# Patient Record
Sex: Female | Born: 1935 | Race: White | Hispanic: No | Smoking: Never smoker
Health system: Southern US, Community
[De-identification: ages and names within clinical notes are randomized; demographics above are authoritative.]

## PROBLEM LIST (undated history)

## (undated) DIAGNOSIS — I441 Atrioventricular block, second degree: Secondary | ICD-10-CM

## (undated) DIAGNOSIS — I509 Heart failure, unspecified: Secondary | ICD-10-CM

## (undated) DIAGNOSIS — M8580 Other specified disorders of bone density and structure, unspecified site: Secondary | ICD-10-CM

## (undated) DIAGNOSIS — G629 Polyneuropathy, unspecified: Secondary | ICD-10-CM

## (undated) DIAGNOSIS — A0472 Enterocolitis due to Clostridium difficile, not specified as recurrent: Secondary | ICD-10-CM

## (undated) DIAGNOSIS — I10 Essential (primary) hypertension: Secondary | ICD-10-CM

## (undated) DIAGNOSIS — M1990 Unspecified osteoarthritis, unspecified site: Secondary | ICD-10-CM

## (undated) DIAGNOSIS — A0471 Enterocolitis due to Clostridium difficile, recurrent: Secondary | ICD-10-CM

## (undated) DIAGNOSIS — N183 Chronic kidney disease, stage 3 unspecified: Secondary | ICD-10-CM

## (undated) DIAGNOSIS — I4891 Unspecified atrial fibrillation: Secondary | ICD-10-CM

## (undated) DIAGNOSIS — G4730 Sleep apnea, unspecified: Secondary | ICD-10-CM

## (undated) DIAGNOSIS — I639 Cerebral infarction, unspecified: Secondary | ICD-10-CM

## (undated) DIAGNOSIS — K648 Other hemorrhoids: Secondary | ICD-10-CM

## (undated) DIAGNOSIS — K635 Polyp of colon: Secondary | ICD-10-CM

## (undated) DIAGNOSIS — C50919 Malignant neoplasm of unspecified site of unspecified female breast: Secondary | ICD-10-CM

## (undated) DIAGNOSIS — I5020 Unspecified systolic (congestive) heart failure: Secondary | ICD-10-CM

## (undated) DIAGNOSIS — Z923 Personal history of irradiation: Secondary | ICD-10-CM

## (undated) DIAGNOSIS — I4819 Other persistent atrial fibrillation: Secondary | ICD-10-CM

## (undated) DIAGNOSIS — Z85828 Personal history of other malignant neoplasm of skin: Secondary | ICD-10-CM

## (undated) DIAGNOSIS — E785 Hyperlipidemia, unspecified: Secondary | ICD-10-CM

## (undated) DIAGNOSIS — C541 Malignant neoplasm of endometrium: Secondary | ICD-10-CM

## (undated) DIAGNOSIS — K56600 Partial intestinal obstruction, unspecified as to cause: Secondary | ICD-10-CM

## (undated) DIAGNOSIS — K565 Intestinal adhesions [bands], unspecified as to partial versus complete obstruction: Secondary | ICD-10-CM

## (undated) DIAGNOSIS — E7800 Pure hypercholesterolemia, unspecified: Secondary | ICD-10-CM

## (undated) HISTORY — DX: Polyneuropathy, unspecified: G62.9

## (undated) HISTORY — PX: BREAST SURGERY: SHX581

## (undated) HISTORY — DX: Polyp of colon: K63.5

## (undated) HISTORY — DX: Essential (primary) hypertension: I10

## (undated) HISTORY — DX: Partial intestinal obstruction, unspecified as to cause: K56.600

## (undated) HISTORY — DX: Other hemorrhoids: K64.8

## (undated) HISTORY — DX: Malignant neoplasm of endometrium: C54.1

## (undated) HISTORY — DX: Personal history of irradiation: Z92.3

## (undated) HISTORY — DX: Malignant neoplasm of unspecified site of unspecified female breast: C50.919

## (undated) HISTORY — DX: Enterocolitis due to Clostridium difficile, recurrent: A04.71

## (undated) HISTORY — DX: Other persistent atrial fibrillation: I48.19

## (undated) HISTORY — DX: Other specified disorders of bone density and structure, unspecified site: M85.80

## (undated) HISTORY — PX: MASTECTOMY: SHX3

## (undated) HISTORY — PX: OOPHORECTOMY: SHX86

## (undated) HISTORY — DX: Intestinal adhesions (bands), unspecified as to partial versus complete obstruction: K56.50

## (undated) HISTORY — DX: Atrioventricular block, second degree: I44.1

---

## 1989-05-08 HISTORY — PX: CHOLECYSTECTOMY: SHX55

## 1997-09-07 DIAGNOSIS — C50919 Malignant neoplasm of unspecified site of unspecified female breast: Secondary | ICD-10-CM

## 1997-09-07 HISTORY — PX: MASTECTOMY: SHX3

## 1997-09-07 HISTORY — DX: Malignant neoplasm of unspecified site of unspecified female breast: C50.919

## 2004-09-07 DIAGNOSIS — C541 Malignant neoplasm of endometrium: Secondary | ICD-10-CM

## 2004-09-07 HISTORY — PX: ABDOMINAL HYSTERECTOMY: SHX81

## 2004-09-07 HISTORY — DX: Malignant neoplasm of endometrium: C54.1

## 2005-09-07 HISTORY — PX: INSERT / REPLACE / REMOVE PACEMAKER: SUR710

## 2005-09-07 HISTORY — PX: OTHER SURGICAL HISTORY: SHX169

## 2005-09-07 HISTORY — PX: EXPLORATORY LAPAROTOMY: SUR591

## 2009-04-18 ENCOUNTER — Ambulatory Visit: Admission: RE | Admit: 2009-04-18 | Discharge: 2009-04-18 | Payer: Self-pay | Admitting: Gynecologic Oncology

## 2009-04-18 ENCOUNTER — Other Ambulatory Visit: Admission: RE | Admit: 2009-04-18 | Discharge: 2009-04-18 | Payer: Self-pay | Admitting: Gynecologic Oncology

## 2009-04-18 ENCOUNTER — Encounter: Payer: Self-pay | Admitting: Gynecologic Oncology

## 2009-09-07 DIAGNOSIS — K635 Polyp of colon: Secondary | ICD-10-CM

## 2009-09-07 HISTORY — DX: Polyp of colon: K63.5

## 2009-10-17 ENCOUNTER — Ambulatory Visit: Admission: RE | Admit: 2009-10-17 | Discharge: 2009-10-17 | Payer: Self-pay | Admitting: Gynecologic Oncology

## 2010-04-10 ENCOUNTER — Other Ambulatory Visit: Admission: RE | Admit: 2010-04-10 | Discharge: 2010-04-10 | Payer: Self-pay | Admitting: Gynecologic Oncology

## 2010-04-10 ENCOUNTER — Ambulatory Visit: Admission: RE | Admit: 2010-04-10 | Discharge: 2010-04-10 | Payer: Self-pay | Admitting: Gynecologic Oncology

## 2010-04-14 ENCOUNTER — Ambulatory Visit: Payer: Self-pay | Admitting: Cardiology

## 2010-05-15 ENCOUNTER — Ambulatory Visit: Payer: Self-pay | Admitting: Cardiology

## 2010-05-20 ENCOUNTER — Encounter: Admission: RE | Admit: 2010-05-20 | Discharge: 2010-06-20 | Payer: Self-pay | Admitting: Neurology

## 2010-06-01 ENCOUNTER — Ambulatory Visit: Payer: Self-pay | Admitting: Cardiology

## 2010-06-16 ENCOUNTER — Ambulatory Visit: Payer: Self-pay | Admitting: Cardiology

## 2010-07-08 ENCOUNTER — Encounter: Admission: RE | Admit: 2010-07-08 | Discharge: 2010-07-08 | Payer: Self-pay | Admitting: Endocrinology

## 2010-07-12 ENCOUNTER — Encounter: Payer: Self-pay | Admitting: Internal Medicine

## 2010-07-17 ENCOUNTER — Ambulatory Visit: Payer: Self-pay | Admitting: Cardiology

## 2010-08-04 ENCOUNTER — Ambulatory Visit: Payer: Self-pay | Admitting: Cardiology

## 2010-08-18 ENCOUNTER — Ambulatory Visit: Payer: Self-pay | Admitting: Internal Medicine

## 2010-09-03 ENCOUNTER — Ambulatory Visit: Payer: Self-pay | Admitting: Cardiology

## 2010-09-29 ENCOUNTER — Ambulatory Visit: Payer: Self-pay | Admitting: Cardiology

## 2010-10-01 ENCOUNTER — Encounter
Admission: RE | Admit: 2010-10-01 | Discharge: 2010-10-01 | Payer: Self-pay | Source: Home / Self Care | Attending: Internal Medicine | Admitting: Internal Medicine

## 2010-10-07 NOTE — Miscellaneous (Signed)
Summary: Device preload  Clinical Lists Changes  Observations: Added new observation of PPM INDICATN: Sick sinus syndrome (07/12/2010 11:24) Added new observation of MAGNET RTE: BOL 85 ERI 65 (07/12/2010 11:24) Added new observation of PPMLEADSTAT2: active (07/12/2010 11:24) Added new observation of PPMLEADSER2: [id number] (07/12/2010 11:24) Added new observation of PPMLEADMOD2: 4092  (07/12/2010 11:24) Added new observation of PPMLEADDOI2: 06/23/2006  (07/12/2010 11:24) Added new observation of PPMLEADLOC2: RV  (07/12/2010 11:24) Added new observation of PPMLEADSTAT1: active  (07/12/2010 11:24) Added new observation of PPMLEADSER1: [id number]  (07/12/2010 11:24) Added new observation of PPMLEADMOD1: 5076  (07/12/2010 11:24) Added new observation of PPMLEADDOI1: 06/23/2006  (07/12/2010 11:24) Added new observation of PPMLEADLOC1: RA  (07/12/2010 11:24) Added new observation of PPM DOI: 06/23/2006  (07/12/2010 11:24) Added new observation of PPM SERL#: [id number]  (07/12/2010 11:24) Added new observation of PPM MODL#: [id number]  (07/12/2010 11:24) Added new observation of PACEMAKERMFG: Medtronic  (07/12/2010 11:24) Added new observation of PPM IMP MD: Duffy Rhody Tennant,MD  (07/12/2010 11:24) Added new observation of PPM REFER MD: Rolla Plate  (07/12/2010 11:24) Added new observation of PACEMAKER MD: Sherryl Manges, MD  (07/12/2010 11:24)      PPM Specifications Following MD:  Sherryl Manges, MD     Referring MD:  Rolla Plate PPM Vendor:  Medtronic     PPM Model Number:  [id number]     PPM Serial Number:  [id number] PPM DOI:  06/23/2006     PPM Implanting MD:  Rolla Plate  Lead 1    Location: RA     DOI: 06/23/2006     Model #: [id number]     Serial #: [id number]     Status: active Lead 2    Location: RV     DOI: 06/23/2006     Model #: [id number]     Serial #: [id number]     Status: active  Magnet Response Rate:  BOL 85 ERI 65  Indications:  Sick sinus syndrome

## 2010-10-09 NOTE — Cardiovascular Report (Signed)
Summary: Office Visit   Office Visit   Imported By: Roderic Ovens 08/26/2010 08:50:14  _____________________________________________________________________  External Attachment:    Type:   Image     Comment:   External Document

## 2010-10-09 NOTE — Procedures (Signed)
Summary: pacer check/medtronic   Current Medications (verified): 1)  Propafenone Hcl 225 Mg Tabs (Propafenone Hcl) .... Take 1 Tablet By Mouth Three Times A Day 2)  Furosemide 20 Mg Tabs (Furosemide) .... Take 1 Tablet By Mouth Once Daily 3)  Hydralazine Hcl 50 Mg Tabs (Hydralazine Hcl) .... Take 1 Tablet By Mouth Two Times A Day 4)  Metoprolol Tartrate 100 Mg Tabs (Metoprolol Tartrate) .... Take 1 Tablet By Mouth Once Daily 5)  Lisinopril 10 Mg Tabs (Lisinopril) .... Take 1 Tablet By Mouth Two Times A Day 6)  Warfarin Sodium 2.5 Mg Tabs (Warfarin Sodium) .... Take As Directed By [facility] 7)  Zetia 10 Mg Tabs (Ezetimibe) .... Take 1 Tablet By Mouth Once Daily 8)  Lyrica 75 Mg Caps (Pregabalin) .... Take 1 Tablet By Mouth Two Times A Day 9)  Digoxin 0.125 Mg Tabs (Digoxin) .... Take 1 Tablet By Mouth Once Daily 10)  Vitamin D 5000 Iu .... Take 1 Tablet By Mouth Once Daily  Allergies (verified): 1)  ! Pcn   PPM Specifications Following MD:  Sherryl Manges, MD     Referring MD:  Rolla Plate PPM Vendor:  Medtronic     PPM Model Number:  [id number]     PPM Serial Number:  [id number] PPM DOI:  06/23/2006     PPM Implanting MD:  Rolla Plate  Lead 1    Location: RA     DOI: 06/23/2006     Model #: [id number]     Serial #: [id number]     Status: active Lead 2    Location: RV     DOI: 06/23/2006     Model #: [id number]     Serial #: [id number]     Status: active  Magnet Response Rate:  BOL 85 ERI 65  Indications:  Sick sinus syndrome   PPM Follow Up Battery Voltage:  2.99 V       PPM Device Measurements Atrium  Amplitude: 0.5 mV, Impedance: 680 ohms, Threshold: 0.5 V at 0.4 msec Right Ventricle  Amplitude: 3.8 mV, Impedance: 472 ohms, Threshold: 1.0 V at 0.4 msec  Episodes MS Episodes:  82     Percent Mode Switch:  0.7%     Coumadin:  Yes Ventricular High Rate:  0     Atrial Pacing:  98.7%     Ventricular Pacing:  1.5%  Parameters Mode:  MVP     Lower Rate Limit:  55     Upper  Rate Limit:  130 Paced AV Delay:  180     Sensed AV Delay:  150 Next Cardiology Appt Due:  11/26/2010 Tech Comments:  [facility] PT---82 AF EPISODES--LONGEST WAS 5 HRS. + COUMADIN. NORMAL DEVICE FUNCTION.  NO CHANGES MADE. ROV 11-26-10 @ 1100 W/SK. PT TO RESTART CARELINK AFTER OV W/SK.  Vella Kohler  August 18, 2010 5:07 PM

## 2010-10-28 ENCOUNTER — Encounter (INDEPENDENT_AMBULATORY_CARE_PROVIDER_SITE_OTHER): Payer: Medicare Other

## 2010-10-28 DIAGNOSIS — Z7901 Long term (current) use of anticoagulants: Secondary | ICD-10-CM

## 2010-10-28 DIAGNOSIS — I4891 Unspecified atrial fibrillation: Secondary | ICD-10-CM

## 2010-11-14 ENCOUNTER — Encounter: Payer: Self-pay | Admitting: Internal Medicine

## 2010-11-24 ENCOUNTER — Encounter: Payer: Self-pay | Admitting: *Deleted

## 2010-11-24 DIAGNOSIS — I4891 Unspecified atrial fibrillation: Secondary | ICD-10-CM

## 2010-11-25 ENCOUNTER — Ambulatory Visit (INDEPENDENT_AMBULATORY_CARE_PROVIDER_SITE_OTHER): Payer: Medicare Other | Admitting: *Deleted

## 2010-11-25 DIAGNOSIS — I4891 Unspecified atrial fibrillation: Secondary | ICD-10-CM

## 2010-11-25 DIAGNOSIS — Z7901 Long term (current) use of anticoagulants: Secondary | ICD-10-CM

## 2010-11-25 LAB — POCT INR: INR: 1.7

## 2010-11-26 ENCOUNTER — Telehealth: Payer: Self-pay | Admitting: *Deleted

## 2010-11-26 ENCOUNTER — Encounter: Payer: Self-pay | Admitting: Internal Medicine

## 2010-11-26 ENCOUNTER — Ambulatory Visit (INDEPENDENT_AMBULATORY_CARE_PROVIDER_SITE_OTHER): Payer: Medicare Other | Admitting: Internal Medicine

## 2010-11-26 VITALS — BP 128/73 | HR 71 | Ht 67.0 in | Wt 196.8 lb

## 2010-11-26 DIAGNOSIS — I440 Atrioventricular block, first degree: Secondary | ICD-10-CM

## 2010-11-26 DIAGNOSIS — Z950 Presence of cardiac pacemaker: Secondary | ICD-10-CM

## 2010-11-26 DIAGNOSIS — I495 Sick sinus syndrome: Secondary | ICD-10-CM | POA: Insufficient documentation

## 2010-11-26 DIAGNOSIS — I4891 Unspecified atrial fibrillation: Secondary | ICD-10-CM

## 2010-11-26 DIAGNOSIS — I4510 Unspecified right bundle-branch block: Secondary | ICD-10-CM

## 2010-11-26 NOTE — Assessment & Plan Note (Signed)
She has profound first degree AV block. I will review with Dr. Patty Sermons and Laymond Purser as to whether would not want to sedate her for CPX testing to see whether she be more affectively treated with some degree of ventricular pacing

## 2010-11-26 NOTE — Assessment & Plan Note (Signed)
The patient's device was interrogated.  The information was reviewed. No changes were made in the programming.    

## 2010-11-26 NOTE — Patient Instructions (Signed)
Your physician recommends that you schedule a follow-up appointment in: YEAR WITH DR KLEIN 

## 2010-11-26 NOTE — Assessment & Plan Note (Addendum)
The patient has atrial fibrillation that is paroxysmal. It is not rapid according to device interrogation data. He is recently well controlled with Rythmol  However, I am impressed that the degree of conduction system disease and wonder whether Rythmol is appropriate to maintain long-term. However, she has been quite asymptomatic and so I will defer this to Dr. Patty Sermons.  I do wonder whether we could stop the digoxin at this time given her presumed normal left ventricular function.    She is appropriately on coumadin with CHADS VASC score of 3

## 2010-11-26 NOTE — Telephone Encounter (Signed)
Dr Odessa Fleming phoned regarding patient digoxin and if she needs to continue, has had a low heart rate.  Discussed with Dr Patty Sermons and will discontinue.  Called patient and advised.  Advised patient to call back if any problems.

## 2010-11-26 NOTE — Progress Notes (Signed)
HPI: Yolanda Spears is seen to establish pacemaker followup.  She has a history of tachybradycardia syndrome and underwent pacemaker implantation in 2007 with a Medtronic device. She has been managed with Rythmol as well as rate control with digoxin And metoprolol. Current Outpatient Prescriptions  Medication Sig Dispense Refill  . calcium carbonate (TUMS EX) 750 MG chewable tablet Chew 1 tablet by mouth daily.        . Cholecalciferol (VITAMIN D3) 5000 UNITS CAPS Take 1 capsule by mouth daily.        . digoxin (LANOXIN) 0.125 MG tablet Take 125 mcg by mouth daily.        Marland Kitchen ezetimibe (ZETIA) 10 MG tablet Take 10 mg by mouth daily.        . furosemide (LASIX) 20 MG tablet Take 20 mg by mouth daily. 1 TAB QD       . hydrALAZINE (APRESOLINE) 25 MG tablet Take 25 mg by mouth 2 (two) times daily.        Marland Kitchen lisinopril (PRINIVIL,ZESTRIL) 10 MG tablet Take 10 mg by mouth 2 (two) times daily.        . metoprolol (LOPRESSOR) 100 MG tablet Take 100 mg by mouth daily.        . pregabalin (LYRICA) 75 MG capsule Take 75 mg by mouth 2 (two) times daily.        . propafenone (RYTHMOL) 225 MG tablet Take 225 mg by mouth every 8 (eight) hours.        Marland Kitchen warfarin (COUMADIN) 2.5 MG tablet Take 2.5 mg by mouth as directed.          Allergies  Allergen Reactions  . Penicillins   . Shellfish-Derived Products     Past Medical History  Diagnosis Date  . Atrial fibrillation   . Pacemaker   . Endometrial cancer   . Hypertension     Past Surgical History  Procedure Date  . Mastectomy   . Total abdominal hysterectomy w/ bilateral salpingoophorectomy   . Lymph node resection     Family History  Problem Relation Age of Onset  . Cancer Mother     History   Social History  . Marital Status: Widowed    Spouse Name: N/A    Number of Children: N/A  . Years of Education: N/A   Occupational History  . Retired    Social History Main Topics  . Smoking status: Former Smoker -- 1.0 packs/day    Types:  Cigarettes    Quit date: 11/26/1998  . Smokeless tobacco: Not on file  . Alcohol Use: No  . Drug Use: No  . Sexually Active: Not on file   Other Topics Concern  . Not on file   Social History Narrative  . No narrative on file    Fourteen point review of systems was negative except as noted in HPI and PMH Except for tinnitus, incipient cataracts, recent easy satiety.  PHYSICAL EXAMINATION  Blood pressure 128/73, pulse 71, height 5\' 7"  (1.702 m), weight 196 lb 12.8 oz (89.268 kg).   Well developed and nourished Older Caucasian female appearing her stated age in no acute distress HENT normal Neck supple with JVP-flat Carotids brisk and full without bruits Back without kyphosis and scoliosis Bilateral mastectomy Pacemaker pocket well healed Clear Regular rate and rhythm, no murmurs or gallops Abd-soft with active BS without hepatomegaly or midline pulsation Femoral pulses 2+ distal pulses intact No Clubbing cyanosis edema Skin-warm and dry LN-neg submandibular and supraclavicular A &  Oriented CN 3-12 normal  Grossly normal sensory and motor function Affect engaging . ECG  A paced at 60 Intervals 0.52/0.1/0.44 Axis is -14

## 2010-11-26 NOTE — Assessment & Plan Note (Signed)
The issue with right bundle branch block in the setting of Rythmol is whether the conduction system disease that may represent has been aggravated by propafenone and whether this is also aggravating AV node conduction.

## 2010-11-27 NOTE — Telephone Encounter (Signed)
Agree 

## 2010-12-29 ENCOUNTER — Ambulatory Visit (INDEPENDENT_AMBULATORY_CARE_PROVIDER_SITE_OTHER): Payer: Medicare Other | Admitting: *Deleted

## 2010-12-29 DIAGNOSIS — I4891 Unspecified atrial fibrillation: Secondary | ICD-10-CM

## 2011-01-07 ENCOUNTER — Other Ambulatory Visit: Payer: Self-pay | Admitting: Cardiology

## 2011-01-07 DIAGNOSIS — I10 Essential (primary) hypertension: Secondary | ICD-10-CM

## 2011-01-07 NOTE — Telephone Encounter (Signed)
Fax received from pharmacy. Refill completed. Jodette Khamryn Calderone RN  

## 2011-01-09 ENCOUNTER — Encounter: Payer: Self-pay | Admitting: Cardiology

## 2011-01-09 ENCOUNTER — Ambulatory Visit (INDEPENDENT_AMBULATORY_CARE_PROVIDER_SITE_OTHER): Payer: Medicare Other | Admitting: Cardiology

## 2011-01-09 VITALS — BP 130/60 | HR 63 | Wt 197.0 lb

## 2011-01-09 DIAGNOSIS — R55 Syncope and collapse: Secondary | ICD-10-CM

## 2011-01-09 DIAGNOSIS — I119 Hypertensive heart disease without heart failure: Secondary | ICD-10-CM

## 2011-01-09 DIAGNOSIS — I4891 Unspecified atrial fibrillation: Secondary | ICD-10-CM

## 2011-01-09 LAB — CBC WITH DIFFERENTIAL/PLATELET
Basophils Relative: 1.1 % (ref 0.0–3.0)
Eosinophils Relative: 1.4 % (ref 0.0–5.0)
Lymphocytes Relative: 13.9 % (ref 12.0–46.0)
Neutrophils Relative %: 74.8 % (ref 43.0–77.0)
RBC: 4.42 Mil/uL (ref 3.87–5.11)
WBC: 5.9 10*3/uL (ref 4.5–10.5)

## 2011-01-09 LAB — COMPREHENSIVE METABOLIC PANEL
Albumin: 3.4 g/dL — ABNORMAL LOW (ref 3.5–5.2)
BUN: 23 mg/dL (ref 6–23)
Calcium: 8.8 mg/dL (ref 8.4–10.5)
Chloride: 102 mEq/L (ref 96–112)
Glucose, Bld: 106 mg/dL — ABNORMAL HIGH (ref 70–99)
Potassium: 3.7 mEq/L (ref 3.5–5.1)

## 2011-01-09 MED ORDER — HYDRALAZINE HCL 25 MG PO TABS: 25.0000 mg | ORAL_TABLET | Freq: Two times a day (BID) | ORAL | Status: DC

## 2011-01-09 NOTE — Assessment & Plan Note (Signed)
The patient is not having any problems from her Coumadin.  Her recent INR was therapeutic at 2.5.  The patient has not had any TIA symptoms.

## 2011-01-09 NOTE — Progress Notes (Signed)
Yolanda Spears Date of Birth:  Sep 24, 1934 [facility] [address]   [address] [place], [place]  [address] [phone]           Fax   [phone]  History of Present Illness: This pleasant 76 year old woman is seen for a scheduled 3 month followup office visit.  She has a history of paroxysmal atrial fibrillation.  She has a functioning dual-chamber pacemaker.  Pacemaker was implanted elsewhere in 2007.  His not having any chest pain or TIA or stroke symptoms.  He has a history of idiopathic peripheral neuropathy.  She has a past history of fluid retention which responds to low-dose furosemide.  Her echocardiogram on 10/03/08 showed ejection fraction of 55-60% with normal systolic and diastolic function and mild left atrial enlargement and mild aortic stenosis with mild to moderate aortic insufficiency and she had normal pulmonary artery pressure.  She has not had cardiac catheterization.  She does not give any history of ischemic heart disease.  She does have a past history of essential hypertension.  Current Outpatient Prescriptions  Medication Sig Dispense Refill  . Cholecalciferol (VITAMIN D3) 5000 UNITS CAPS Take 1 capsule by mouth daily.        Marland Kitchen ezetimibe (ZETIA) 10 MG tablet Take 5 mg by mouth daily.       . furosemide (LASIX) 20 MG tablet Take 20 mg by mouth daily. 1 TAB QD       . hydrALAZINE (APRESOLINE) 25 MG tablet Take 1 tablet (25 mg total) by mouth 2 (two) times daily.  30 tablet  12  . lisinopril (PRINIVIL,ZESTRIL) 10 MG tablet Take 10 mg by mouth 2 (two) times daily.        . metoprolol (LOPRESSOR) 100 MG tablet Take 100 mg by mouth daily.        . pregabalin (LYRICA) 75 MG capsule Take 75 mg by mouth 2 (two) times daily.        . propafenone (RYTHMOL) 225 MG tablet Take 225 mg by mouth every 8 (eight) hours.        Marland Kitchen warfarin (COUMADIN) 2.5 MG tablet Take 2.5 mg by mouth as directed.        Marland Kitchen DISCONTD: hydrALAZINE (APRESOLINE) 25 MG tablet Take  50 mg by mouth 2 (two) times daily.       . digoxin (LANOXIN) 0.125 MG tablet Take 125 mcg by mouth daily.        . metoprolol (TOPROL-XL) 100 MG 24 hr tablet TAKE 1 TABLET BY MOUTH EVERY DAY  90 tablet  3    Allergies  Allergen Reactions  . Penicillins   . Shellfish-Derived Products   . Statins     Myalgias,elevated LFT studies    Patient Active Problem List  Diagnoses  . Atrial fibrillation  . First degree AV block  . Pacemaker- Dual-chamber-mdt  . Right bundle branch block  . Near syncope    History  Smoking status  . Former Smoker -- 1.0 packs/day  . Types: Cigarettes  . Quit date: 11/26/1998  Smokeless tobacco  . Not on file    History  Alcohol Use No    Family History  Problem Relation Age of Onset  . Cancer Mother     Review of Systems: Constitutional: no fever chills diaphoresis or fatigue or change in weight.  Head and neck: no hearing loss, no epistaxis, no photophobia or visual disturbance. Respiratory: No cough, shortness of breath or wheezing. Cardiovascular: No chest pain  peripheral edema, palpitations. Gastrointestinal: No abdominal distention, no abdominal pain, no change in bowel habits hematochezia or melena. Genitourinary: No dysuria, no frequency, no urgency, no nocturia. Musculoskeletal:No arthralgias, no back pain, no gait disturbance or myalgias. Neurological: No dizziness, no headaches, no numbness, no seizures, no syncope, no weakness, no tremors. Hematologic: No lymphadenopathy, no easy bruising. Psychiatric: No confusion, no hallucinations, no sleep disturbance.    Physical Exam: Filed Vitals:   01/09/11 1125  BP: 130/60  Pulse: 63  Her blood pressure supine is 130/60 and standing is 120/50.  The general appearance reveals a well-developed well-nourished woman in no distress.Pupils equal and reactive.   Extraocular Movements are full.  There is no scleral icterus.  The mouth and pharynx are normal.  The neck is supple.  The  carotids reveal no bruits.  The jugular venous pressure is normal.  The thyroid is not enlarged.  There is no lymphadenopathy.The chest is clear to percussion and auscultation. There are no rales or rhonchi. Expansion of the chest is symmetrical.The precordium is quiet.  The first heart sound is normal.  The second heart sound is physiologically split.  There is no murmur gallop rub or click.  There is no abnormal lift or heave.The abdomen is soft and nontender. Bowel sounds are normal. The liver and spleen are not enlarged. There Are no abdominal masses. There are no bruits.The pedal pulses are good.  There is no phlebitis or edema.  There is no cyanosis or clubbing.Strength is normal and symmetrical in all extremities.  There is no lateralizing weakness.  There are no sensory deficits.The skin is warm and dry.  There is no rash.   Assessment / Plan: Her symptoms may be related to orthostatic hypotension.  We are going to reduce her Apresoline to 25 mg twice a day.  We're also checking a CBC and a Comprehensive metabolic panel today.Return at monthly intervals for protime and in 3 months for followup office visit and protime.

## 2011-01-09 NOTE — Assessment & Plan Note (Signed)
The patient comes in for a routine 3 month followup office visit.  She has not been feeling as well she has been experiencing symptoms of intermittent leg weakness and vague symptoms in her epigastrium and feels like she is about to black out.  If she can lie down the symptoms resolved.  She does not think that the symptoms are related to hypoglycemia.She has not been expressing any exertional chest pain or angina.  She is on long-term Coumadin for her atrial fibrillation and she has a functioning pacemaker.  Dr. Graciela Husbands follows her pacemaker

## 2011-01-13 ENCOUNTER — Telehealth: Payer: Self-pay | Admitting: *Deleted

## 2011-01-13 NOTE — Telephone Encounter (Signed)
Results reported. 

## 2011-01-20 NOTE — Consult Note (Signed)
NAME:  KAYLONI, ROCCO NO.:  [id number]   MEDICAL RECORD NO.:  [id number]          PATIENT TYPE:  OUT   LOCATION:  GYN                          [facility]ACILITY[facility]   PHYSICIAN:  Laurette Schimke, MD     DATE OF BIRTH:  [birth year]   DATE OF CONSULTATION:  04/18/2009  DATE OF DISCHARGE:                                 CONSULTATION   REQUESTED BY:  Dr. Tonna Corner.   REASON FOR CONSULTATION:  First surveillance of endometrial cancer.   HISTORY OF PRESENT ILLNESS:  This is a 76 year old who underwent a total  abdominal hysterectomy and bilateral salpingo-oophorectomy with  bilateral pelvic lymph node dissection, peri-aortic lymph node sampling  in April 2006.  The pathology is notable for metastatic disease in the  pelvic lymph nodes.  She received adjuvant radiation therapy that was  completed in June 2006.  The patient did well and was asymptomatic.  Left retroperitoneal cyst and left hydronephrosis.  In April 2007, she  underwent resection of the lymphocyst.  Of note, she did well; however,  in December 2008, a Pap smear was noted to have AGUS.  A CT of the  abdomen and pelvis was obtained and was within normal limits.  Biopsies  were performed and there was no evidence of recurrent disease.  Ms.  Boeder has done well since.   PAST MEDICAL HISTORY:  1. Bilateral lobular carcinoma in situ.  2. Atrial fibrillation diagnosed in 2006.  3. Uropathy diagnosed in 2010.  4. Hypertension diagnosed in 1997.   PAST SURGICAL HISTORY:  1. Bilateral mastectomy in 1999.  2. Pacemaker placement in 2007.  3. Total abdominal hysterectomy, bilateral salpingo-oophorectomy and      bilateral pelvic lymph node and peri-aortic lymph node dissection      in 2006.  4. Lymphocytic resection in 2007, which was complicated by an      infection.   PAST GYN HISTORY:  Menarche occurred at age 18.  Menses occurred  monthly.  She is gravida 2, para 2.   FAMILY HISTORY:  Notable for  her mother with uterine cancer diagnosed in  her 80's, and breast cancer in her 101's.  Father was diagnosed with bone  cancer in his 85's.   MEDICATIONS:  1. Gabapentin 100 mg three times daily.  2. Propafenone 325 mg three times daily.  3. Hydralazine 50 mg twice daily.  4. Metoprolol 100 mg once daily.  5. Hydrochlorothiazide 50 mg, 1/2 tab daily.  6. Zetia 10 mg, 1/2 tab daily.  7. Lisinopril 10 mg daily.  8. Digoxin 0.125 mg daily.  9. Warfarin 2.5 mg daily.   ALLERGIES:  PENICILLIN which causes hives.  SHELLFISH, which cause  emesis.   REVIEW OF SYSTEMS:  A 10-point review of systems was negative.   PHYSICAL EXAMINATION:  VITAL SIGNS:  Weight 205 pounds, height 5 feet 7  inches, blood pressure 140/70.  GENERAL:  A well-developed female, in no acute distress.  CHEST:  Clear to auscultation.  HEART:  Regular rate and rhythm.  ABDOMEN:  Soft, obese, nontender.  No masses or hernia.  BACK:  No CVA tenderness.  EXTREMITIES:  No clubbing, cyanosis or edema.  PELVIC EXAM:  Normal external genitalia, Bartholin's, urethral and  Skene's.  The vagina is somewhat foreshortened and atrophic.  No lesions  are visualized.  No parametrial thickening or nodularity.  RECTAL:  External hemorrhoids were appreciated.  Good anal sphincter  tone.  No rectovaginal masses.   IMPRESSION/PLAN:  Stage IIIC endometrial cancer:  Ms. Hittle remains  without any evidence of disease.  A Pap test was collected today and she  was advised to follow up in six months.      Laurette Schimke, MD  Electronically Signed     WB/MEDQ  D:  04/18/2009  T:  04/18/2009  Job:  [id number]   cc:   Telford Nab, R.N.  [address]  [place], [place] [address]   Tonna Corner  Fax: [phone]   Primary Care Physician

## 2011-01-23 ENCOUNTER — Telehealth: Payer: Self-pay | Admitting: Cardiology

## 2011-01-23 NOTE — Telephone Encounter (Signed)
Sent copy of labs to PCP at patient request

## 2011-01-23 NOTE — Telephone Encounter (Signed)
Called and discussed labs.

## 2011-01-23 NOTE — Telephone Encounter (Signed)
CALL PATIENT REGARDING RECENT LABWORK.

## 2011-01-26 ENCOUNTER — Encounter: Payer: Medicare Other | Admitting: *Deleted

## 2011-01-28 ENCOUNTER — Ambulatory Visit (INDEPENDENT_AMBULATORY_CARE_PROVIDER_SITE_OTHER): Payer: Medicare Other | Admitting: *Deleted

## 2011-01-28 DIAGNOSIS — I4891 Unspecified atrial fibrillation: Secondary | ICD-10-CM

## 2011-01-28 LAB — POCT INR: INR: 2.3

## 2011-02-16 ENCOUNTER — Other Ambulatory Visit: Payer: Self-pay | Admitting: *Deleted

## 2011-02-16 DIAGNOSIS — E785 Hyperlipidemia, unspecified: Secondary | ICD-10-CM

## 2011-02-16 MED ORDER — EZETIMIBE 10 MG PO TABS: 5.0000 mg | ORAL_TABLET | Freq: Every day | ORAL | Status: DC

## 2011-02-16 NOTE — Telephone Encounter (Signed)
Refilled meds per fax request.  

## 2011-02-26 ENCOUNTER — Ambulatory Visit (INDEPENDENT_AMBULATORY_CARE_PROVIDER_SITE_OTHER): Payer: Medicare Other | Admitting: *Deleted

## 2011-02-26 ENCOUNTER — Encounter: Payer: Medicare Other | Admitting: *Deleted

## 2011-02-26 DIAGNOSIS — I4891 Unspecified atrial fibrillation: Secondary | ICD-10-CM

## 2011-02-26 LAB — POCT INR: INR: 2.1

## 2011-03-04 ENCOUNTER — Encounter: Payer: Self-pay | Admitting: *Deleted

## 2011-03-12 ENCOUNTER — Other Ambulatory Visit: Payer: Self-pay | Admitting: Internal Medicine

## 2011-03-12 ENCOUNTER — Ambulatory Visit (INDEPENDENT_AMBULATORY_CARE_PROVIDER_SITE_OTHER): Payer: Medicare Other | Admitting: *Deleted

## 2011-03-12 DIAGNOSIS — I440 Atrioventricular block, first degree: Secondary | ICD-10-CM

## 2011-03-12 DIAGNOSIS — I4891 Unspecified atrial fibrillation: Secondary | ICD-10-CM

## 2011-03-12 DIAGNOSIS — Z950 Presence of cardiac pacemaker: Secondary | ICD-10-CM

## 2011-03-13 LAB — REMOTE PACEMAKER DEVICE
AL IMPEDENCE PM: 576 Ohm
AL IMPEDENCE PM: 576 Ohm
ATRIAL PACING PM: 98.24
ATRIAL PACING PM: 98.24
BAMS-0001: 170 {beats}/min
BAMS-0001: 170 {beats}/min
BATTERY VOLTAGE: 2.99 V
BAVD-0001: 150 ms
BMOD-0002: 7
BMOD-0004: 5
BPAC-0002RV: 2.5 V
BRDY-0002: 55 {beats}/min
BRDY-0004: 130 {beats}/min
BSEN-0004RA: 310 ms
BSEN-0005RA: 150 ms
BSEN-0005RV: 200 ms
DEV-0004LDO: 5076
DEV-0006LDO: 20071017
DEV-0006LDO: 20071017
DEV-0023LDO: 0
DEV-0023LDO: 0
DEV-0023PM: 0
RV LEAD AMPLITUDE: 5.4579 mv
RV LEAD IMPEDENCE PM: 448 Ohm
VENTRICULAR PACING PM: 0.84

## 2011-03-18 ENCOUNTER — Encounter: Payer: Self-pay | Admitting: *Deleted

## 2011-03-19 ENCOUNTER — Emergency Department (HOSPITAL_COMMUNITY): Payer: Medicare Other

## 2011-03-19 ENCOUNTER — Inpatient Hospital Stay (HOSPITAL_COMMUNITY)
Admission: EM | Admit: 2011-03-19 | Discharge: 2011-03-21 | DRG: 069 | Disposition: A | Discharge status: Home / Self Care | Payer: Medicare Other | Attending: Internal Medicine | Admitting: Internal Medicine

## 2011-03-19 ENCOUNTER — Encounter (HOSPITAL_COMMUNITY): Payer: Self-pay | Admitting: Radiology

## 2011-03-19 DIAGNOSIS — A498 Other bacterial infections of unspecified site: Secondary | ICD-10-CM | POA: Diagnosis present

## 2011-03-19 DIAGNOSIS — Z7901 Long term (current) use of anticoagulants: Secondary | ICD-10-CM

## 2011-03-19 DIAGNOSIS — N390 Urinary tract infection, site not specified: Secondary | ICD-10-CM | POA: Diagnosis present

## 2011-03-19 DIAGNOSIS — Z8673 Personal history of transient ischemic attack (TIA), and cerebral infarction without residual deficits: Secondary | ICD-10-CM

## 2011-03-19 DIAGNOSIS — I10 Essential (primary) hypertension: Secondary | ICD-10-CM | POA: Diagnosis present

## 2011-03-19 DIAGNOSIS — Z8542 Personal history of malignant neoplasm of other parts of uterus: Secondary | ICD-10-CM

## 2011-03-19 DIAGNOSIS — E781 Pure hyperglyceridemia: Secondary | ICD-10-CM | POA: Diagnosis present

## 2011-03-19 DIAGNOSIS — R791 Abnormal coagulation profile: Secondary | ICD-10-CM | POA: Diagnosis present

## 2011-03-19 DIAGNOSIS — Z950 Presence of cardiac pacemaker: Secondary | ICD-10-CM

## 2011-03-19 DIAGNOSIS — Z79899 Other long term (current) drug therapy: Secondary | ICD-10-CM

## 2011-03-19 DIAGNOSIS — N368 Other specified disorders of urethra: Secondary | ICD-10-CM | POA: Diagnosis not present

## 2011-03-19 DIAGNOSIS — G459 Transient cerebral ischemic attack, unspecified: Principal | ICD-10-CM | POA: Diagnosis present

## 2011-03-19 DIAGNOSIS — I4891 Unspecified atrial fibrillation: Secondary | ICD-10-CM | POA: Diagnosis present

## 2011-03-19 LAB — CBC
HCT: 40.7 % (ref 36.0–46.0)
Hemoglobin: 13.9 g/dL (ref 12.0–15.0)
MCV: 90 fL (ref 78.0–100.0)
WBC: 7 10*3/uL (ref 4.0–10.5)

## 2011-03-19 LAB — URINALYSIS, ROUTINE W REFLEX MICROSCOPIC
Glucose, UA: NEGATIVE mg/dL
Ketones, ur: NEGATIVE mg/dL
Nitrite: POSITIVE — AB
Specific Gravity, Urine: 1.02 (ref 1.005–1.030)
pH: 5 (ref 5.0–8.0)

## 2011-03-19 LAB — DIFFERENTIAL
Basophils Relative: 0 % (ref 0–1)
Eosinophils Relative: 3 % (ref 0–5)
Monocytes Absolute: 0.6 10*3/uL (ref 0.1–1.0)
Monocytes Relative: 9 % (ref 3–12)
Neutrophils Relative %: 76 % (ref 43–77)

## 2011-03-19 LAB — BASIC METABOLIC PANEL
CO2: 31 mEq/L (ref 19–32)
Calcium: 9.1 mg/dL (ref 8.4–10.5)
Chloride: 101 mEq/L (ref 96–112)
Glucose, Bld: 115 mg/dL — ABNORMAL HIGH (ref 70–99)
Potassium: 3.8 mEq/L (ref 3.5–5.1)
Sodium: 141 mEq/L (ref 135–145)

## 2011-03-19 LAB — TROPONIN I: Troponin I: <0.3 ng/mL (ref <0.30)

## 2011-03-19 LAB — PROTIME-INR: INR: 1.41 (ref 0.00–1.49)

## 2011-03-19 LAB — APTT: aPTT: 31 seconds (ref 24–37)

## 2011-03-19 LAB — URINE MICROSCOPIC-ADD ON

## 2011-03-20 DIAGNOSIS — G459 Transient cerebral ischemic attack, unspecified: Secondary | ICD-10-CM

## 2011-03-20 LAB — HEMOGLOBIN A1C
Hgb A1c MFr Bld: 5.7 % — ABNORMAL HIGH (ref <5.7)
Mean Plasma Glucose: 117 mg/dL — ABNORMAL HIGH (ref <117)

## 2011-03-20 LAB — LIPID PANEL: Cholesterol: 161 mg/dL (ref 0–200)

## 2011-03-20 LAB — PROTIME-INR: INR: 1.64 — ABNORMAL HIGH (ref 0.00–1.49)

## 2011-03-20 NOTE — Progress Notes (Signed)
Pacer remote check  

## 2011-03-21 ENCOUNTER — Inpatient Hospital Stay (HOSPITAL_COMMUNITY): Payer: Medicare Other

## 2011-03-21 LAB — PROTIME-INR: Prothrombin Time: 25.6 seconds — ABNORMAL HIGH (ref 11.6–15.2)

## 2011-03-21 MED ORDER — IOHEXOL 350 MG/ML SOLN
50.0000 mL | Freq: Once | INTRAVENOUS | Status: AC | PRN
  Administered 2011-03-21: 50 mL via INTRAVENOUS

## 2011-03-22 LAB — URINE CULTURE

## 2011-03-26 ENCOUNTER — Other Ambulatory Visit: Payer: Self-pay | Admitting: *Deleted

## 2011-03-26 ENCOUNTER — Other Ambulatory Visit (INDEPENDENT_AMBULATORY_CARE_PROVIDER_SITE_OTHER): Payer: Medicare Other | Admitting: *Deleted

## 2011-03-26 ENCOUNTER — Ambulatory Visit (INDEPENDENT_AMBULATORY_CARE_PROVIDER_SITE_OTHER): Payer: Medicare Other | Admitting: *Deleted

## 2011-03-26 DIAGNOSIS — E559 Vitamin D deficiency, unspecified: Secondary | ICD-10-CM

## 2011-03-26 DIAGNOSIS — I10 Essential (primary) hypertension: Secondary | ICD-10-CM

## 2011-03-26 DIAGNOSIS — I4891 Unspecified atrial fibrillation: Secondary | ICD-10-CM

## 2011-03-26 LAB — LIPID PANEL
Cholesterol: 188 mg/dL (ref 0–200)
HDL: 43.8 mg/dL (ref >39.00)
VLDL: 27 mg/dL (ref 0.0–40.0)

## 2011-03-26 LAB — POCT INR: INR: 2.2

## 2011-03-27 LAB — VITAMIN D 25 HYDROXY (VIT D DEFICIENCY, FRACTURES): Vit D, 25-Hydroxy: 74 ng/mL (ref 30–89)

## 2011-04-06 ENCOUNTER — Other Ambulatory Visit: Payer: Self-pay | Admitting: *Deleted

## 2011-04-06 MED ORDER — LISINOPRIL 10 MG PO TABS: 10.0000 mg | ORAL_TABLET | Freq: Two times a day (BID) | ORAL | Status: DC

## 2011-04-06 NOTE — Telephone Encounter (Signed)
escribe medication per fax request  

## 2011-04-07 ENCOUNTER — Other Ambulatory Visit: Payer: Self-pay | Admitting: Cardiology

## 2011-04-08 ENCOUNTER — Other Ambulatory Visit: Payer: Self-pay | Admitting: *Deleted

## 2011-04-08 MED ORDER — WARFARIN SODIUM 2.5 MG PO TABS: 2.5000 mg | ORAL_TABLET | ORAL | Status: DC

## 2011-04-16 NOTE — Discharge Summary (Signed)
NAMEMarland Kitchen  Yolanda Spears, Yolanda Spears NO.:  [id number]  MEDICAL RECORD NO.:  [id number]  LOCATION:  3022                         FACILITY:  [facility]  PHYSICIAN:  Calvert Cantor, M.D.     DATE OF BIRTH:  1934-10-07  DATE OF ADMISSION:  03/19/2011 DATE OF DISCHARGE:  03/21/2011                              DISCHARGE SUMMARY   PRIMARY CARE PHYSICIAN:  Thayer Headings, MD  PRESENTING COMPLAINT:  Aphasia and confusion.  DISCHARGE DIAGNOSES: 1. Transient ischemic attack. 2. Hypertriglyceridemia.  PAST MEDICAL HISTORY: 1. Atrial fibrillation. 2. Pacemaker. 3. Hypertension. 4. History of endometrial cancer. 5. Prior cerebrovascular accidents and transient ischemic attacks.  DISCHARGE MEDICATIONS:  Continue all of the following: 1. Lisinopril 10 mg daily twice a day. 2. Lyrica 75 mg twice a day. 3. Furosemide 20 mg daily. 4. Hydralazine 25 mg twice a day. 5. Propafenone 225 mg every 8 hours. 6. Metoprolol XL 100 mg daily. 7. Warfarin 2.5 mg daily at bedtime. 8. Zetia 10 mg 1/2 tab daily at bedtime. 9. Tums 1 tab daily. 10.Vitamin D 5000 units daily.  PROCEDURES: 1. CT scan of the head without contrast performed on March 19, 2011,     revealed atrophy with small-vessel chronic ischemic changes of deep     cerebral white matter.  Tiny old lacunar infarct, right thalamus.     No acute intracranial abnormalities. 2. CTA of the head and neck revealed 30% stenosis of proximal left     subclavian artery.  Atherosclerotic disease at both carotid     bifurcations but without narrowing of the ICA lumen, beyond that     the most distal cervical internal carotid arteries.  Therefore,     using the NASCET criteria, there is no stenosis.  The left vertebral artery is a large vessel, widely patent.  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  Enlargement of the thyroid with multiple nodules likely represents  multinodular goiter.  Atherosclerotic calcification in both carotid siphons with narrowing, estimated at 50% bilaterally.  Stenosis at both supraclinoid ICA region 50-70% on the right and 30-50% on the left.  No middle cerebral artery stenosis.  Diminutive A1 segment on the right, probably congenital.  No basilar stenosis.  Reconstituted retrograde flow was noted in the distal right vertebral artery.  Left vertebral is widely patent.  A 2D echo revealed the LV cavity size was normal, systolic function was normal, EF was 55-60%.  Wall motion was normal; there were no regional wall motion abnormalities.  Aortic valve reveals trivial regurgitation. Left atrium was mildly dilated.  Atrial septum did not reveal any defect or patent foremen ovale.  Ultrasound/carotid duplex did not reveal any evidence of stenosis bilaterally.  Left vertebral artery flow was antegrade, right vertebral artery flow is not visualized.  Waveform is minimized with no diastolic flow, suggesting distal occlusion.  PERTINENT LAB RESULTS:  Triglycerides were noted to be elevated at 198, HDL was 39, LDL was 82, cholesterol level 161.  HOSPITAL COURSE:  This is a 76 year old female with atrial fibrillation who presented with aphasia and confusion.  This episode lasted about 45  minutes.  She tried calling her son and her sister, however, she was having trouble remembering and dialing phone number.  The patient was admitted and above-mentioned workup was done.  It was noted that her INR was subtherapeutic at 1.41.  MRI could not be obtained as the patient has a pacemaker.  She was placed on full-dose Lovenox on admission.  Symptoms had resolved and did not recur during the hospital stay.  INR has improved to 2.29 today and therefore subcutaneous Lovenox has been discontinued.  The patient is also noted to have mild hypertriglyceridemia.  She is advised to control her diet for now and have this rechecked.  PHYSICAL  EXAMINATION:  NEUROLOGIC:  Cranial nerves II through XII intact.  Strength is intact in all 4 extremities. LUNGS:  Clear bilaterally. HEART:  Irregular rate and rhythm.  No murmur. ABDOMEN:  Soft, nontender, nondistended.  Bowel sounds positive. EXTREMITIES:  No cyanosis, clubbing, or edema.  CONDITION ON DISCHARGE:  Stable.  FOLLOWUP INSTRUCTIONS: 1. She is to follow up with Dr. Pearlean Brownie from [facility] in 3-4     weeks. 2. She is to follow up with Dr. Patty Sermons, cardiologist. 3. Follow up with primary care physician in 2 weeks.  TIME ON PATIENT CARE:  50 minutes.     Calvert Cantor, M.D.     SR/MEDQ  D:  04/11/2011  T:  04/11/2011  Job:  [id number]  cc:   Thayer Headings, M.D.  Electronically Signed by Calvert Cantor M.D. on 04/16/2011 02:04:11 PM

## 2011-04-18 NOTE — H&P (Signed)
NAME:  Yolanda Spears, Yolanda Spears NO.:  [id number]  MEDICAL RECORD NO.:  [id number]  LOCATION:  [facility]                         FACILITY:  [facility]  PHYSICIAN:  Peggye Pitt, M.D. DATE OF BIRTH:  12/06/1934  DATE OF ADMISSION:  03/19/2011 DATE OF DISCHARGE:                             HISTORY & PHYSICAL   PRIMARY CARE PHYSICIAN:  Thayer Headings, M.D.  CHIEF COMPLAINT:  Aphasia and confusion.  HISTORY OF PRESENT ILLNESS:  Yolanda Spears is a pleasant 76 year old obese Caucasian woman with a history of prior CVA/TIAs, atrial fibrillation, who presents to the hospital today with an episode of confusion earlier today.  She was at home getting a manicure at about 10:30 in the morning and wanted to slide back into bed.  However, noticed that she was unable to move.  She subsequently tried to tell her manicurist what was going on with her, but she noticed that when she was trying to say did not make any sense and she is conscious of this.  She also tried to call her son or her sister; however, she was having trouble dialing the phone numbers which is usually not a problem for her.  She does have a wife alert button and she proceeded to press it and that is how she came into the hospital.  The symptoms completely resolved within about 45 minutes and it resolved at time of my examination.  She denies any chest pain, shortness of breath, headaches, blurry or double vision, abdominal pain, nausea, vomiting or any other symptoms.  ALLERGIES:  She has stated allergies to PENICILLIN.  PAST MEDICAL HISTORY:  Significant for, 1. Prior CVA/TIAs. 2. Atrial fibrillation maintained on chronic anticoagulation with     Coumadin. 3. History of endometrial cancer status post surgery and radiation. 4. Hypertension.  HOME MEDICATIONS: 1. Lisinopril 10 mg twice daily. 2. Lyrica 75 mg twice daily. 3. Hydralazine 25 mg twice daily. 4. Metoprolol 100 mg daily. 5. Lasix 20 mg daily. 6.  Rythmol 225 mg three times a day. 7. Warfarin 2.5 mg daily. 8. Zetia 10 mg to take half a tablet daily.  SOCIAL HISTORY:  She denies any alcohol, tobacco, or illicit drug use. Lives independently.  FAMILY HISTORY:  Significant for heart disease in both parents.  REVIEW OF SYSTEMS:  Negative except as mentioned in history present illness.  PHYSICAL EXAMINATION:  VITAL SIGNS:  On admission, blood pressure 116/76, heart rate 67, respirations 19, sats 99% on room air, and temperature of 97.8. GENERAL:  She is alert, awake, oriented x3, does not appear to be in any distress. HEENT:  Normocephalic, atraumatic.  Her pupils are equal, round, and reactive to light with intact extraocular movements, she wears corrective lenses. NECK:  Supple.  No JVD.  No lymphadenopathy.  No bruits.  No goiter. HEART:  Irregular without murmurs, rubs, or gallops. ABDOMEN:  Soft, nontender, nondistended with positive bowel sounds. EXTREMITIES:  No clubbing, cyanosis, or edema. NEUROLOGIC:  Currently intact and nonfocal.  LABORATORY DATA:  On admission, sodium 141, potassium 3.8, chloride 101, bicarb 31, BUN 27, creatinine 1.07, glucose of 115.  WBC 7.0, hemoglobin 13.9, platelets are clumped, but the count appeared accurate.  Troponin  of less than 0.30 and INR 1.41.  EKG that shows an atrial-paced rhythm.  Her EKG was interrogated and she has had frequent episodes of AFib since March of this year.  ASSESSMENT AND PLAN: 1. Transient ischemic attack versus cerebrovascular accident.  I will     admit her to the hospital and do the stroke workup to include a 2-D     echocardiogram, carotid Dopplers, fasting lipid profile.  I will     continue her Coumadin to be dosed by pharmacy, because she is     subtherapeutic and in light of her recent neurological event, I     will also place her on therapeutic doses of Lovenox bridge, which     can be discontinued once her INR is therapeutic. 2. Hypertension, blood  pressure is currently well controlled.  We will     continue home medications. 3. For prophylaxis, she will be on therapeutic doses of Lovenox.     Peggye Pitt, M.D.     EH/MEDQ  D:  03/19/2011  T:  03/19/2011  Job:  [id number]  Electronically Signed by Peggye Pitt M.D. on 04/18/2011 05:22:52 PM

## 2011-04-28 ENCOUNTER — Ambulatory Visit (INDEPENDENT_AMBULATORY_CARE_PROVIDER_SITE_OTHER): Payer: Medicare Other | Admitting: *Deleted

## 2011-04-28 ENCOUNTER — Encounter: Payer: Self-pay | Admitting: Cardiology

## 2011-04-28 ENCOUNTER — Ambulatory Visit (INDEPENDENT_AMBULATORY_CARE_PROVIDER_SITE_OTHER): Payer: Medicare Other | Admitting: Cardiology

## 2011-04-28 VITALS — BP 130/70 | HR 70 | Ht 67.0 in | Wt 200.0 lb

## 2011-04-28 DIAGNOSIS — I4891 Unspecified atrial fibrillation: Secondary | ICD-10-CM

## 2011-04-28 DIAGNOSIS — I119 Hypertensive heart disease without heart failure: Secondary | ICD-10-CM

## 2011-04-28 DIAGNOSIS — G459 Transient cerebral ischemic attack, unspecified: Secondary | ICD-10-CM

## 2011-04-28 NOTE — Assessment & Plan Note (Signed)
The patient's recent TIA in July 2012 is characterized by expressive aphasia and word salad.  This had basically cleared by the time she got to the hospital.  She has not had any recurrent symptoms to suggest TIA.  Her INR today is therapeutic at 2.3.

## 2011-04-28 NOTE — Assessment & Plan Note (Signed)
Today the patient is in a regular rhythm.  EKG shows paced atrial rhythm with a prolonged first degree heart block and she has a right bundle branch block which is not new.  She has not been aware of any recent palpitations or irregular heartbeat

## 2011-04-28 NOTE — Progress Notes (Signed)
Patient had recent TIA in July and on admission INR was only 1.4.  She had only missed 1 dose of coumadin and that was 1 day earlier.  She desires to check INR more frequently for a while.  Ok per  Dr. Patty Sermons

## 2011-04-28 NOTE — Patient Instructions (Signed)
Return in 2 weeks for Protime

## 2011-04-28 NOTE — Assessment & Plan Note (Signed)
Patient has not had any recent symptoms of high blood pressure blood pressure today is normal.  She's not having any headaches or dizziness.  She's had no recurrent TIA symptoms.

## 2011-04-28 NOTE — Progress Notes (Signed)
Yolanda Spears Date of Birth:  [birth year] [facility] [address]   [address] [place], [place]  [address] [phone]           Fax   [phone]  History of Present Illness: This pleasant 76 year old woman is seen for a scheduled followup office visit.  Her son is also present at today's visit.  The patient has not been doing as well.  In July she was hospitalized with what was felt to be a TIA.  He presented with expressive aphasia.  By the time she got to the emergency room the expressive aphasia was clearing on its own.  We don't have any of the hospital records but the patient states that on arrival her prothrombin time was low at 1.4.  She had had a virus the day before and it failed to take her Coumadin.  Patient states she was treated with heparin for several days until her protime was back to therapeutic levels.  She did have a CT scan with and without contrast which showed atrophic changes and a probable small hold lacunar infarct on the right side but nothing on the left side and the patient is right-handed.  The patient could not have an MRI because of her pacemaker.  She was discharged on exactly the same medicines that she had been taking at home including same dose of Coumadin.  Her chronic Coumadin dose has been 2.5 mg daily and she has been stable on that dose.  We're checking an INR today.  The patient has a past history of paroxysmal atrial fibrillation.  She has had a functioning dual-chamber pacemaker since 2007.  She also has a history of idiopathic peripheral neuropathy.  She's had a past history of fluid retention which has responded to low-dose furosemide.  Her echocardiogram on 10/03/08 showed an ejection fraction of 55-60% with normal systolic and diastolic function and mild mitral enlargement and mild aortic stenosis with mild to moderate aortic insufficiency and normal pulmonary artery pressure.  She has not had cardiac catheterization.  She does  not have any history of ischemic heart disease.  She does have a history of essential hypertension.  Current Outpatient Prescriptions  Medication Sig Dispense Refill  . Cholecalciferol (VITAMIN D3) 5000 UNITS CAPS Take 1 capsule by mouth daily. Every other day      . ezetimibe (ZETIA) 10 MG tablet Take 0.5 tablets (5 mg total) by mouth daily.  30 tablet  11  . furosemide (LASIX) 20 MG tablet Take 20 mg by mouth daily. 1 TAB QD       . hydrALAZINE (APRESOLINE) 25 MG tablet Take 1 tablet (25 mg total) by mouth 2 (two) times daily.  30 tablet  12  . lisinopril (PRINIVIL,ZESTRIL) 10 MG tablet Take 1 tablet (10 mg total) by mouth 2 (two) times daily.  60 tablet  11  . metoprolol (LOPRESSOR) 100 MG tablet Take 100 mg by mouth daily.        . metoprolol (TOPROL-XL) 100 MG 24 hr tablet TAKE 1 TABLET BY MOUTH EVERY DAY  90 tablet  3  . pregabalin (LYRICA) 75 MG capsule Take 75 mg by mouth 2 (two) times daily.        . propafenone (RYTHMOL) 225 MG tablet Take 225 mg by mouth every 8 (eight) hours.        . Tetrahydrozoline HCl (EYE DROPS OP) Apply to eye as needed.        . warfarin (  COUMADIN) 2.5 MG tablet Take 1 tablet (2.5 mg total) by mouth as directed.  32 tablet  11    Allergies  Allergen Reactions  . Other     Contrast dye   . Penicillins   . Shellfish-Derived Products   . Statins     Myalgias,elevated LFT studies  . Iohexol Hives and Itching    Itching in eyes, one hive on face.    Patient Active Problem List  Diagnoses  . Atrial fibrillation  . First degree AV block  . Pacemaker- Dual-chamber-mdt  . Right bundle branch block  . Near syncope  . Benign hypertensive heart disease without heart failure    History  Smoking status  . Former Smoker -- 1.0 packs/day  . Types: Cigarettes  . Quit date: 11/26/1998  Smokeless tobacco  . Not on file    History  Alcohol Use No    Family History  Problem Relation Age of Onset  . Cancer Mother     Review of  Systems: Constitutional: no fever chills diaphoresis or fatigue or change in weight.  Head and neck: no hearing loss, no epistaxis, no photophobia or visual disturbance. Respiratory: No cough, shortness of breath or wheezing. Cardiovascular: No chest pain peripheral edema, palpitations. Gastrointestinal: No abdominal distention, no abdominal pain, no change in bowel habits hematochezia or melena. Genitourinary: No dysuria, no frequency, no urgency, no nocturia. Musculoskeletal:No arthralgias, no back pain, no gait disturbance or myalgias. Neurological: No dizziness, no headaches, no numbness, no seizures, no syncope, no weakness, no tremors. Hematologic: No lymphadenopathy, no easy bruising. Psychiatric: No confusion, no hallucinations, no sleep disturbance.    Physical Exam: Filed Vitals:   04/28/11 1046  BP: 130/70  Pulse: 70  The general appearance reveals a well-developed well-nourished woman in no distress.  Her speech is normal today.Pupils equal and reactive.   Extraocular Movements are full.  There is no scleral icterus.  The mouth and pharynx are normal.  The neck is supple.  The carotids reveal no bruits.  The jugular venous pressure is normal.  The thyroid is not enlarged.  There is no lymphadenopathy.  The chest is clear to percussion and auscultation. There are no rales or rhonchi. Expansion of the chest is symmetrical.  The precordium is quiet.  The first heart sound is normal.  The second heart sound is physiologically split.  There is no murmur gallop rub or click.  There is no abnormal lift or heave.  The abdomen is soft and nontender. Bowel sounds are normal. The liver and spleen are not enlarged. There Are no abdominal masses. There are no bruits.  The pedal pulses are good.  There is no phlebitis or edema.  There is no cyanosis or clubbing.  Strength is normal and symmetrical in all extremities.  There is no lateralizing weakness.  There are no sensory  deficits.  The skin is warm and dry.  There is no rash.    EKG shows a paced atrial rhythm with a prolonged first degree heart block and a right bundle branch   Assessment / Plan:  The patient is to continue her same medication.  We talked about possible alternatives to Coumadin such as Pradaxa and we talked about the advantages and disadvantages and she thought she would prefer to stay with Coumadin.  Because her protime dropped so quickly at the time of her admission she would like to check the ProTime a little more frequently initially and we will have her return in 2  weeks for another pro time continue same dose of Coumadin 2.5 mg daily for now.  Avoid any significant change in dietary vitamin K. She is to keep her followup appointment with her neurologist tomorrow.  We'll plan to see back for a followup office visit in 3-4 months

## 2011-05-14 ENCOUNTER — Ambulatory Visit (INDEPENDENT_AMBULATORY_CARE_PROVIDER_SITE_OTHER): Payer: Medicare Other | Admitting: *Deleted

## 2011-05-14 DIAGNOSIS — I4891 Unspecified atrial fibrillation: Secondary | ICD-10-CM

## 2011-05-14 LAB — POCT INR: INR: 2.6

## 2011-06-08 ENCOUNTER — Telehealth: Payer: Self-pay | Admitting: Cardiology

## 2011-06-08 NOTE — Telephone Encounter (Signed)
Pt is calling because she is seeing Dr. Juleen China tomorrow and wondering if he could just do her PT/INR and send the results over

## 2011-06-09 LAB — PROTIME-INR: INR: 2 — AB (ref 0.9–1.1)

## 2011-06-10 ENCOUNTER — Ambulatory Visit (INDEPENDENT_AMBULATORY_CARE_PROVIDER_SITE_OTHER): Payer: Self-pay | Admitting: Internal Medicine

## 2011-06-10 DIAGNOSIS — R0989 Other specified symptoms and signs involving the circulatory and respiratory systems: Secondary | ICD-10-CM

## 2011-06-10 NOTE — Telephone Encounter (Signed)
Spoke with pt.  States she had her INR done at Dr. Marylen Ponto office yesterday.  LM with medical records at Dr. Marylen Ponto office to get results.

## 2011-06-10 NOTE — Telephone Encounter (Signed)
INR from Dr. Marylen Ponto office was 2.0.  See anti-coag visit for details.

## 2011-06-11 ENCOUNTER — Ambulatory Visit (INDEPENDENT_AMBULATORY_CARE_PROVIDER_SITE_OTHER): Payer: Medicare Other | Admitting: *Deleted

## 2011-06-11 ENCOUNTER — Encounter: Payer: Medicare Other | Admitting: *Deleted

## 2011-06-11 ENCOUNTER — Other Ambulatory Visit: Payer: Self-pay | Admitting: Internal Medicine

## 2011-06-11 ENCOUNTER — Encounter: Payer: Self-pay | Admitting: Internal Medicine

## 2011-06-11 DIAGNOSIS — Z950 Presence of cardiac pacemaker: Secondary | ICD-10-CM

## 2011-06-11 DIAGNOSIS — I4891 Unspecified atrial fibrillation: Secondary | ICD-10-CM

## 2011-06-11 DIAGNOSIS — I440 Atrioventricular block, first degree: Secondary | ICD-10-CM

## 2011-06-12 LAB — REMOTE PACEMAKER DEVICE
AL AMPLITUDE: 2 mv
AL IMPEDENCE PM: 552 Ohm
BATTERY VOLTAGE: 2.99 V
RV LEAD AMPLITUDE: 3.8 mv
RV LEAD IMPEDENCE PM: 440 Ohm
VENTRICULAR PACING PM: 1.08

## 2011-06-16 ENCOUNTER — Encounter: Payer: Self-pay | Admitting: *Deleted

## 2011-06-22 NOTE — Progress Notes (Signed)
Pacer remote check  

## 2011-07-08 ENCOUNTER — Encounter: Payer: Medicare Other | Admitting: *Deleted

## 2011-07-13 ENCOUNTER — Ambulatory Visit (INDEPENDENT_AMBULATORY_CARE_PROVIDER_SITE_OTHER): Payer: Medicare Other | Admitting: *Deleted

## 2011-07-13 DIAGNOSIS — I4891 Unspecified atrial fibrillation: Secondary | ICD-10-CM

## 2011-08-06 ENCOUNTER — Encounter: Payer: Self-pay | Admitting: *Deleted

## 2011-08-10 ENCOUNTER — Ambulatory Visit (INDEPENDENT_AMBULATORY_CARE_PROVIDER_SITE_OTHER): Payer: Medicare Other | Admitting: Cardiology

## 2011-08-10 ENCOUNTER — Ambulatory Visit (INDEPENDENT_AMBULATORY_CARE_PROVIDER_SITE_OTHER): Payer: Medicare Other | Admitting: *Deleted

## 2011-08-10 ENCOUNTER — Encounter: Payer: Self-pay | Admitting: Cardiology

## 2011-08-10 VITALS — BP 128/70 | HR 70 | Ht 67.0 in | Wt 203.0 lb

## 2011-08-10 DIAGNOSIS — I4891 Unspecified atrial fibrillation: Secondary | ICD-10-CM

## 2011-08-10 DIAGNOSIS — G459 Transient cerebral ischemic attack, unspecified: Secondary | ICD-10-CM

## 2011-08-10 DIAGNOSIS — I119 Hypertensive heart disease without heart failure: Secondary | ICD-10-CM

## 2011-08-10 DIAGNOSIS — E7800 Pure hypercholesterolemia, unspecified: Secondary | ICD-10-CM | POA: Insufficient documentation

## 2011-08-10 DIAGNOSIS — Z950 Presence of cardiac pacemaker: Secondary | ICD-10-CM

## 2011-08-10 DIAGNOSIS — Z8673 Personal history of transient ischemic attack (TIA), and cerebral infarction without residual deficits: Secondary | ICD-10-CM

## 2011-08-10 DIAGNOSIS — E785 Hyperlipidemia, unspecified: Secondary | ICD-10-CM

## 2011-08-10 DIAGNOSIS — R42 Dizziness and giddiness: Secondary | ICD-10-CM

## 2011-08-10 NOTE — Assessment & Plan Note (Signed)
The patient has not been aware of any tachycardia or palpitations.  She remains on propafenone to try to hold her in normal sinus rhythm.

## 2011-08-10 NOTE — Patient Instructions (Signed)
Your physician recommends that you continue on your current medications as directed. Please refer to the Current Medication list given to you today. Your physician wants you to follow-up in: 4 months You will receive a reminder letter in the mail two months in advance. If you don't receive a letter, please call our office to schedule the follow-up appointment.  

## 2011-08-10 NOTE — Assessment & Plan Note (Signed)
The patient has been experiencing brief vertigo when she changes position.  It is not associated with any nausea or vomiting.  It is quite mild at this point and she does not require any meclizine or active treatment.

## 2011-08-10 NOTE — Assessment & Plan Note (Signed)
The patient has not had any further TIA symptoms.  She remains on long-term Coumadin.

## 2011-08-10 NOTE — Assessment & Plan Note (Signed)
The patient has a history of hypercholesterolemia.  She is trying to watch her diet.  She is intolerant of statins.  He is on ezetimibe

## 2011-08-10 NOTE — Progress Notes (Signed)
Yolanda Spears Date of Birth:  07/03/1935 [facility] / [facility] [address]   [address] [place], [place]  [address] [phone]           Fax   [phone]  History of Present Illness: This pleasant 76 year old woman is seen back for a scheduled followup office visit.  She has a past history of atrial fibrillation.  It is paroxysmal atrial fibrillation.  She has had a functioning dual-chamber pacemaker since 2007.  As a history of essential hypertension and a history of idiopathic peripheral neuropathy.  She had an echocardiogram in January 2010 showing mild aortic stenosis and mild to moderate aortic insufficiency and normal pulmonary artery pressure and normal left ventricular systolic and diastolic function.  She does not have any history of ischemic heart disease.  She does have a history of essential hypertension and in July 2012 she was hospitalized briefly with a TIA which presented with expressive aphasia.  Her prothrombin time at the time of admission was low at 1.4 INR.  Current Outpatient Prescriptions  Medication Sig Dispense Refill  . Cholecalciferol (VITAMIN D3) 5000 UNITS CAPS Take 1 capsule by mouth daily. Every other day      . ezetimibe (ZETIA) 10 MG tablet Take 0.5 tablets (5 mg total) by mouth daily.  30 tablet  11  . furosemide (LASIX) 20 MG tablet Take 20 mg by mouth daily. 1 TAB QD       . hydrALAZINE (APRESOLINE) 25 MG tablet Take 1 tablet (25 mg total) by mouth 2 (two) times daily.  30 tablet  12  . lisinopril (PRINIVIL,ZESTRIL) 10 MG tablet Take 1 tablet (10 mg total) by mouth 2 (two) times daily.  60 tablet  11  . metoprolol (TOPROL-XL) 100 MG 24 hr tablet TAKE 1 TABLET BY MOUTH EVERY DAY  90 tablet  3  . pregabalin (LYRICA) 75 MG capsule Take 75 mg by mouth 2 (two) times daily.        . propafenone (RYTHMOL) 225 MG tablet Take 225 mg by mouth every 8 (eight) hours.        . Tetrahydrozoline HCl (EYE DROPS OP) Apply to eye as needed.        .  warfarin (COUMADIN) 2.5 MG tablet Take 1 tablet (2.5 mg total) by mouth as directed.  32 tablet  11    Allergies  Allergen Reactions  . Clindamycin/Lincomycin     heartburn  . Other     Contrast dye   . Penicillins   . Shellfish-Derived Products   . Statins     Myalgias,elevated LFT studies  . Iohexol Hives and Itching    Itching in eyes, one hive on face.    Patient Active Problem List  Diagnoses  . Atrial fibrillation  . First degree AV block  . Pacemaker- Dual-chamber-mdt  . Right bundle branch block  . Near syncope  . Benign hypertensive heart disease without heart failure  . TIA (transient ischemic attack)    History  Smoking status  . Former Smoker -- 1.0 packs/day  . Types: Cigarettes  . Quit date: 11/26/1998  Smokeless tobacco  . Not on file    History  Alcohol Use No    Family History  Problem Relation Age of Onset  . Cancer Mother     Review of Systems: Constitutional: no fever chills diaphoresis or fatigue or change in weight.  Head and neck: no hearing loss, no epistaxis, no photophobia or visual disturbance. Respiratory: No  cough, shortness of breath or wheezing. Cardiovascular: No chest pain peripheral edema, palpitations. Gastrointestinal: No abdominal distention, no abdominal pain, no change in bowel habits hematochezia or melena. Genitourinary: No dysuria, no frequency, no urgency, no nocturia.  She has a history of asymptomatic urinary tract infection. Musculoskeletal:No arthralgias, no back pain, no gait disturbance or myalgias. Neurological: No dizziness, no headaches, no numbness, no seizures, no syncope, no weakness, no tremors.  She does have peripheral neuropathy and is on Lyrica Hematologic: No lymphadenopathy, no easy bruising. Psychiatric: No confusion, no hallucinations, no sleep disturbance.    Physical Exam: Filed Vitals:   08/10/11 1140  BP: 128/70  Pulse: 70   the general appearance reveals a well-developed  well-nourished woman in no distress.Pupils equal and reactive.   Extraocular Movements are full.  There is no scleral icterus.  The mouth and pharynx are normal.  The neck is supple.  The carotids reveal no bruits.  The jugular venous pressure is normal.  The thyroid is not enlarged.  There is no lymphadenopathy.  The chest is clear to percussion and auscultation. There are no rales or rhonchi. Expansion of the chest is symmetrical.  Heart reveals a soft systolic ejection murmur at the base.  No gallop or rub.The abdomen is soft and nontender. Bowel sounds are normal. The liver and spleen are not enlarged. There Are no abdominal masses. There are no bruits.  The pedal pulses are good.  There is no phlebitis or edema.  There is no cyanosis or clubbing. Strength is normal and symmetrical in all extremities.  There is no lateralizing weakness.  There are no sensory deficits.  The skin is warm and dry.  There is no rash.    Assessment / Plan:  Continue same medication.  Recheck in 4 months for followup office visit and EKG

## 2011-08-27 ENCOUNTER — Other Ambulatory Visit (HOSPITAL_COMMUNITY): Payer: Self-pay | Admitting: Endocrinology

## 2011-08-27 DIAGNOSIS — E0590 Thyrotoxicosis, unspecified without thyrotoxic crisis or storm: Secondary | ICD-10-CM

## 2011-08-28 ENCOUNTER — Encounter: Payer: Medicare Other | Admitting: *Deleted

## 2011-09-02 ENCOUNTER — Ambulatory Visit (INDEPENDENT_AMBULATORY_CARE_PROVIDER_SITE_OTHER): Payer: Medicare Other | Admitting: *Deleted

## 2011-09-02 DIAGNOSIS — I4891 Unspecified atrial fibrillation: Secondary | ICD-10-CM

## 2011-09-08 DIAGNOSIS — K56600 Partial intestinal obstruction, unspecified as to cause: Secondary | ICD-10-CM

## 2011-09-08 DIAGNOSIS — I441 Atrioventricular block, second degree: Secondary | ICD-10-CM

## 2011-09-08 HISTORY — DX: Atrioventricular block, second degree: I44.1

## 2011-09-08 HISTORY — DX: Partial intestinal obstruction, unspecified as to cause: K56.600

## 2011-09-10 ENCOUNTER — Other Ambulatory Visit: Payer: Self-pay | Admitting: Internal Medicine

## 2011-09-10 ENCOUNTER — Encounter: Payer: Self-pay | Admitting: Internal Medicine

## 2011-09-10 ENCOUNTER — Ambulatory Visit (INDEPENDENT_AMBULATORY_CARE_PROVIDER_SITE_OTHER): Payer: Medicare Other | Admitting: *Deleted

## 2011-09-10 DIAGNOSIS — I4891 Unspecified atrial fibrillation: Secondary | ICD-10-CM

## 2011-09-10 DIAGNOSIS — Z950 Presence of cardiac pacemaker: Secondary | ICD-10-CM

## 2011-09-10 DIAGNOSIS — I440 Atrioventricular block, first degree: Secondary | ICD-10-CM

## 2011-09-12 LAB — REMOTE PACEMAKER DEVICE
ATRIAL PACING PM: 95.08
BAMS-0001: 170 {beats}/min
RV LEAD IMPEDENCE PM: 448 Ohm
VENTRICULAR PACING PM: 3.55

## 2011-09-13 ENCOUNTER — Encounter (HOSPITAL_COMMUNITY): Payer: Self-pay | Admitting: Emergency Medicine

## 2011-09-13 ENCOUNTER — Inpatient Hospital Stay (HOSPITAL_COMMUNITY)
Admission: EM | Admit: 2011-09-13 | Discharge: 2011-09-16 | DRG: 389 | Disposition: A | Discharge status: Home / Self Care | Payer: Medicare Other | Attending: Family Medicine | Admitting: Family Medicine

## 2011-09-13 DIAGNOSIS — K56609 Unspecified intestinal obstruction, unspecified as to partial versus complete obstruction: Secondary | ICD-10-CM

## 2011-09-13 DIAGNOSIS — E7800 Pure hypercholesterolemia, unspecified: Secondary | ICD-10-CM

## 2011-09-13 DIAGNOSIS — K56600 Partial intestinal obstruction, unspecified as to cause: Secondary | ICD-10-CM | POA: Diagnosis present

## 2011-09-13 DIAGNOSIS — Z950 Presence of cardiac pacemaker: Secondary | ICD-10-CM

## 2011-09-13 DIAGNOSIS — I119 Hypertensive heart disease without heart failure: Secondary | ICD-10-CM

## 2011-09-13 DIAGNOSIS — Z8673 Personal history of transient ischemic attack (TIA), and cerebral infarction without residual deficits: Secondary | ICD-10-CM

## 2011-09-13 DIAGNOSIS — N390 Urinary tract infection, site not specified: Secondary | ICD-10-CM | POA: Diagnosis present

## 2011-09-13 DIAGNOSIS — I4891 Unspecified atrial fibrillation: Secondary | ICD-10-CM | POA: Diagnosis present

## 2011-09-13 DIAGNOSIS — I10 Essential (primary) hypertension: Secondary | ICD-10-CM | POA: Diagnosis present

## 2011-09-13 DIAGNOSIS — G609 Hereditary and idiopathic neuropathy, unspecified: Secondary | ICD-10-CM | POA: Diagnosis present

## 2011-09-13 HISTORY — DX: Pure hypercholesterolemia, unspecified: E78.00

## 2011-09-13 HISTORY — DX: Cerebral infarction, unspecified: I63.9

## 2011-09-13 LAB — BASIC METABOLIC PANEL
CO2: 26 mEq/L (ref 19–32)
Calcium: 9.2 mg/dL (ref 8.4–10.5)
Chloride: 99 mEq/L (ref 96–112)
Creatinine, Ser: 0.95 mg/dL (ref 0.50–1.10)
Glucose, Bld: 127 mg/dL — ABNORMAL HIGH (ref 70–99)

## 2011-09-13 LAB — DIFFERENTIAL
Basophils Absolute: 0 10*3/uL (ref 0.0–0.1)
Eosinophils Relative: 0 % (ref 0–5)
Lymphocytes Relative: 9 % — ABNORMAL LOW (ref 12–46)
Lymphs Abs: 0.7 10*3/uL (ref 0.7–4.0)
Monocytes Absolute: 0.6 10*3/uL (ref 0.1–1.0)
Monocytes Relative: 7 % (ref 3–12)
Neutro Abs: 6.8 10*3/uL (ref 1.7–7.7)

## 2011-09-13 LAB — CBC
HCT: 40.8 % (ref 36.0–46.0)
Hemoglobin: 13.7 g/dL (ref 12.0–15.0)
MCV: 89.9 fL (ref 78.0–100.0)
RBC: 4.54 MIL/uL (ref 3.87–5.11)
RDW: 14 % (ref 11.5–15.5)
WBC: 8.1 10*3/uL (ref 4.0–10.5)

## 2011-09-13 MED ORDER — MORPHINE SULFATE 4 MG/ML IJ SOLN
4.0000 mg | Freq: Once | INTRAMUSCULAR | Status: AC
  Administered 2011-09-13: 4 mg via INTRAVENOUS
  Filled 2011-09-13: qty 1

## 2011-09-13 MED ORDER — SODIUM CHLORIDE 0.9 % IV BOLUS (SEPSIS)
1000.0000 mL | Freq: Once | INTRAVENOUS | Status: AC
  Administered 2011-09-13: 1000 mL via INTRAVENOUS

## 2011-09-13 MED ORDER — ONDANSETRON HCL 4 MG/2ML IJ SOLN
4.0000 mg | Freq: Once | INTRAMUSCULAR | Status: AC
  Administered 2011-09-13: 4 mg via INTRAVENOUS
  Filled 2011-09-13: qty 2

## 2011-09-13 NOTE — ED Notes (Signed)
PT. REPORTS LOW ABDOMINAL PAIN WITH VOMITTING ONSET YESTERDAY AFTERNOON , DENIES DIARRHEA , FEVER OR CHILLS.

## 2011-09-14 ENCOUNTER — Encounter (HOSPITAL_COMMUNITY): Payer: Self-pay | Admitting: Radiology

## 2011-09-14 ENCOUNTER — Other Ambulatory Visit: Payer: Self-pay

## 2011-09-14 ENCOUNTER — Emergency Department (HOSPITAL_COMMUNITY): Payer: Medicare Other

## 2011-09-14 DIAGNOSIS — K56600 Partial intestinal obstruction, unspecified as to cause: Secondary | ICD-10-CM | POA: Diagnosis present

## 2011-09-14 DIAGNOSIS — K56609 Unspecified intestinal obstruction, unspecified as to partial versus complete obstruction: Secondary | ICD-10-CM

## 2011-09-14 LAB — HEPATIC FUNCTION PANEL
ALT: 16 U/L (ref 0–35)
AST: 17 U/L (ref 0–37)
Albumin: 3.2 g/dL — ABNORMAL LOW (ref 3.5–5.2)
Bilirubin, Direct: 0.2 mg/dL (ref 0.0–0.3)
Total Bilirubin: 0.9 mg/dL (ref 0.3–1.2)

## 2011-09-14 LAB — URINE MICROSCOPIC-ADD ON

## 2011-09-14 LAB — BASIC METABOLIC PANEL
CO2: 25 mEq/L (ref 19–32)
Chloride: 101 mEq/L (ref 96–112)
Creatinine, Ser: 0.9 mg/dL (ref 0.50–1.10)

## 2011-09-14 LAB — CBC
HCT: 39.7 % (ref 36.0–46.0)
MCV: 90.6 fL (ref 78.0–100.0)
RBC: 4.38 MIL/uL (ref 3.87–5.11)
RDW: 13.9 % (ref 11.5–15.5)
WBC: 6.8 10*3/uL (ref 4.0–10.5)

## 2011-09-14 LAB — URINALYSIS, ROUTINE W REFLEX MICROSCOPIC
Glucose, UA: NEGATIVE mg/dL
Hgb urine dipstick: NEGATIVE
Ketones, ur: NEGATIVE mg/dL
Protein, ur: 30 mg/dL — AB
pH: 6.5 (ref 5.0–8.0)

## 2011-09-14 LAB — URINE CULTURE

## 2011-09-14 LAB — PROTIME-INR: INR: 1.63 — ABNORMAL HIGH (ref 0.00–1.49)

## 2011-09-14 LAB — APTT: aPTT: 29 seconds (ref 24–37)

## 2011-09-14 LAB — LIPASE, BLOOD: Lipase: 74 U/L — ABNORMAL HIGH (ref 11–59)

## 2011-09-14 LAB — HEPARIN LEVEL (UNFRACTIONATED): Heparin Unfractionated: 0.2 IU/mL — ABNORMAL LOW (ref 0.30–0.70)

## 2011-09-14 MED ORDER — HYDRALAZINE HCL 20 MG/ML IJ SOLN: 10.0000 mg | INTRAMUSCULAR | Status: DC | PRN

## 2011-09-14 MED ORDER — ENOXAPARIN SODIUM 40 MG/0.4ML ~~LOC~~ SOLN: 40.0000 mg | SUBCUTANEOUS | Status: DC

## 2011-09-14 MED ORDER — HYDRALAZINE HCL 25 MG PO TABS
25.0000 mg | ORAL_TABLET | Freq: Two times a day (BID) | ORAL | Status: DC
  Administered 2011-09-14 – 2011-09-16 (×5): 25 mg via ORAL
  Filled 2011-09-14 (×7): qty 1

## 2011-09-14 MED ORDER — LISINOPRIL 10 MG PO TABS
10.0000 mg | ORAL_TABLET | Freq: Two times a day (BID) | ORAL | Status: DC
  Administered 2011-09-14 – 2011-09-16 (×5): 10 mg via ORAL
  Filled 2011-09-14 (×8): qty 1

## 2011-09-14 MED ORDER — ONDANSETRON HCL 4 MG/2ML IJ SOLN: 4.0000 mg | Freq: Four times a day (QID) | INTRAMUSCULAR | Status: DC | PRN

## 2011-09-14 MED ORDER — ACETAMINOPHEN 650 MG RE SUPP: 650.0000 mg | Freq: Four times a day (QID) | RECTAL | Status: DC | PRN

## 2011-09-14 MED ORDER — PROPAFENONE HCL 225 MG PO TABS
225.0000 mg | ORAL_TABLET | Freq: Three times a day (TID) | ORAL | Status: DC
  Administered 2011-09-14 – 2011-09-16 (×6): 225 mg via ORAL
  Filled 2011-09-14 (×8): qty 1

## 2011-09-14 MED ORDER — ZOLPIDEM TARTRATE 5 MG PO TABS: 5.0000 mg | ORAL_TABLET | Freq: Every evening | ORAL | Status: DC | PRN

## 2011-09-14 MED ORDER — METOPROLOL SUCCINATE ER 100 MG PO TB24
100.0000 mg | ORAL_TABLET | Freq: Every day | ORAL | Status: DC
  Administered 2011-09-14 – 2011-09-16 (×3): 100 mg via ORAL
  Filled 2011-09-14 (×4): qty 1

## 2011-09-14 MED ORDER — SODIUM CHLORIDE 0.9 % IV SOLN
INTRAVENOUS | Status: DC
  Administered 2011-09-14 – 2011-09-15 (×3): via INTRAVENOUS

## 2011-09-14 MED ORDER — ACETAMINOPHEN 325 MG PO TABS: 650.0000 mg | ORAL_TABLET | Freq: Four times a day (QID) | ORAL | Status: DC | PRN

## 2011-09-14 MED ORDER — ALUM & MAG HYDROXIDE-SIMETH 200-200-20 MG/5ML PO SUSP: 30.0000 mL | Freq: Four times a day (QID) | ORAL | Status: DC | PRN

## 2011-09-14 MED ORDER — OXYCODONE HCL 5 MG PO TABS: 5.0000 mg | ORAL_TABLET | ORAL | Status: DC | PRN

## 2011-09-14 MED ORDER — DEXTROSE 5 % IV SOLN
1.0000 g | INTRAVENOUS | Status: DC
  Administered 2011-09-14 – 2011-09-16 (×3): 1 g via INTRAVENOUS
  Filled 2011-09-14 (×4): qty 10

## 2011-09-14 MED ORDER — HEPARIN SOD (PORCINE) IN D5W 100 UNIT/ML IV SOLN
1450.0000 [IU]/h | INTRAVENOUS | Status: DC
  Administered 2011-09-14: 1300 [IU]/h via INTRAVENOUS
  Administered 2011-09-14: 1450 [IU]/h via INTRAVENOUS
  Filled 2011-09-14 (×3): qty 250

## 2011-09-14 MED ORDER — ONDANSETRON HCL 4 MG PO TABS: 4.0000 mg | ORAL_TABLET | Freq: Four times a day (QID) | ORAL | Status: DC | PRN

## 2011-09-14 MED ORDER — DEXTROSE 5 % IV SOLN
1.0000 g | INTRAVENOUS | Status: DC
  Filled 2011-09-14: qty 10

## 2011-09-14 MED ORDER — HYDROMORPHONE HCL PF 1 MG/ML IJ SOLN: 0.5000 mg | INTRAMUSCULAR | Status: DC | PRN

## 2011-09-14 NOTE — H&P (Addendum)
DATE OF ADMISSION:  09/14/2011  PCP:    Thayer Headings, MD, MD   Chief Complaint: ABD Pain , Nausea and Vomiting   HPI: Yolanda Spears is an 77 y.o. female who presents secondary to complaints of Diffuse ABD Pain which started at [facility] of the preceding day followed by severe nausea and vomiting . She reports vomtiing bilious emesis, and denies hematemesis.    She has had gastrric distension.  She reports her last BM was in the AM and was soft.  She denies any fevers chills .  She denies dysuria, but was also found to have  A UTI.  She reports having recurrent UTIs as well.    Past Medical History  Diagnosis Date  . Campath-induced atrial fibrillation   . Pacemaker   . Endometrial cancer   . Hypertension   . Breast cancer   . Peripheral neuropathy     Past Surgical History  Procedure Date  . Mastectomy   . Total abdominal hysterectomy w/ bilateral salpingoophorectomy   . Lymph node resection   . Insert / replace / remove pacemaker        Cholecystectomy  Medications:  HOME MEDS: Prior to Admission medications   Medication Sig Start Date End Date Taking? Authorizing Provider  Cholecalciferol (VITAMIN D3) 5000 UNITS CAPS Take 1 capsule by mouth daily. Every other day   Yes Historical Provider, MD  ezetimibe (ZETIA) 10 MG tablet Take 0.5 tablets (5 mg total) by mouth daily. 02/16/11  Yes Cassell Clement, MD  furosemide (LASIX) 20 MG tablet Take 20 mg by mouth daily. 1 TAB QD    Yes Historical Provider, MD  hydrALAZINE (APRESOLINE) 25 MG tablet Take 1 tablet (25 mg total) by mouth 2 (two) times daily. 01/09/11  Yes Cassell Clement, MD  lisinopril (PRINIVIL,ZESTRIL) 10 MG tablet Take 1 tablet (10 mg total) by mouth 2 (two) times daily. 04/06/11  Yes Cassell Clement, MD  metoprolol (TOPROL-XL) 100 MG 24 hr tablet TAKE 1 TABLET BY MOUTH EVERY DAY 01/07/11  Yes Cassell Clement, MD  pregabalin (LYRICA) 75 MG capsule Take 75 mg by mouth 2 (two) times daily.     Yes Historical Provider, MD   propafenone (RYTHMOL) 225 MG tablet Take 225 mg by mouth every 8 (eight) hours.     Yes Historical Provider, MD  Tetrahydrozoline HCl (EYE DROPS OP) Apply to eye as needed.     Yes Historical Provider, MD  warfarin (COUMADIN) 2.5 MG tablet Take 1 tablet (2.5 mg total) by mouth as directed. 04/08/11  Yes Cassell Clement, MD    Allergies:  Allergies  Allergen Reactions  . Clindamycin/Lincomycin     heartburn  . Other     Contrast dye   . Penicillins   . Shellfish-Derived Products   . Statins     Myalgias,elevated LFT studies  . Iohexol Hives and Itching    Itching in eyes, one hive on face.    Social History:   reports that she quit smoking about 12 years ago. Her smoking use included Cigarettes. She smoked 1 pack per day. She does not have any smokeless tobacco history on file. She reports that she does not drink alcohol or use illicit drugs.  Family History: Family History  Problem Relation Age of Onset  . Cancer Mother     Review of Systems:  The patient denies anorexia, fever, weight loss, vision loss, decreased hearing, hoarseness, chest pain, syncope, dyspnea on exertion, peripheral edema, balance deficits, hemoptysis, abdominal pain, melena, hematochezia,  severe indigestion/heartburn, hematuria, incontinence, genital sores, muscle weakness, suspicious skin lesions, transient blindness, difficulty walking, depression, unusual weight change, abnormal bleeding, enlarged lymph nodes, angioedema, and breast masses.   Physical Exam:  GEN:  Pleasant 77 year old Obese Caucasian female examined  and in no acute distress; cooperative with exam Filed Vitals:   09/13/11 2316 09/14/11 0120  BP: 160/62 154/63  Pulse:  70  Temp: 98.1 F (36.7 C) 97.2 F (36.2 C)  TempSrc: Oral Oral  Resp: 18 17  SpO2: 93% 94%   Blood pressure 154/63, pulse 70, temperature 97.2 F (36.2 C), temperature source Oral, resp. rate 17, SpO2 94.00%. PSYCH: SHe is alert and oriented x4; does not appear  anxious does not appear depressed; affect is normal HEENT: Normocephalic and Atraumatic, Mucous membranes pink; PERRLA; EOM intact; Fundi:  Benign;  No scleral icterus, Nares: NGTube present, Nares: Patent, Oropharynx: Clear, Edentulous or Fair Dentition, Neck:  FROM, no cervical lymphadenopathy nor thyromegaly or carotid bruit; no JVD; Breasts:: Not examined CHEST WALL: No tenderness CHEST: Normal respiration, clear to auscultation bilaterally HEART: Regular rate and rhythm; no murmurs rubs or gallops BACK: No kyphosis or scoliosis; no CVA tenderness ABDOMEN: Decreased Positive Bowel Sounds, firm mildly non-tender; no masses, no organomegaly. Rectal Exam: Not done EXTREMITIES:No cyanosis, clubbing or edema; no ulcerations. Genitalia: not examined PULSES: 2+ and symmetric SKIN: Normal hydration no rash or ulceration CNS: Cranial nerves 2-12 grossly intact no focal neurologic deficit   Labs & Imaging Results for orders placed during the hospital encounter of 09/13/11 (from the past 48 hour(s))  CBC     Status: Normal   Collection Time   09/13/11 11:06 PM      Component Value Range Comment   WBC 8.1  4.0 - 10.5 (K/uL)    RBC 4.54  3.87 - 5.11 (MIL/uL)    Hemoglobin 13.7  12.0 - 15.0 (g/dL)    HCT 16.1  09.6 - 04.5 (%)    MCV 89.9  78.0 - 100.0 (fL)    MCH 30.2  26.0 - 34.0 (pg)    MCHC 33.6  30.0 - 36.0 (g/dL)    RDW 40.9  81.1 - 91.4 (%)    Platelets 207  150 - 400 (K/uL)   DIFFERENTIAL     Status: Abnormal   Collection Time   09/13/11 11:06 PM      Component Value Range Comment   Neutrophils Relative 84 (*) 43 - 77 (%)    Neutro Abs 6.8  1.7 - 7.7 (K/uL)    Lymphocytes Relative 9 (*) 12 - 46 (%)    Lymphs Abs 0.7  0.7 - 4.0 (K/uL)    Monocytes Relative 7  3 - 12 (%)    Monocytes Absolute 0.6  0.1 - 1.0 (K/uL)    Eosinophils Relative 0  0 - 5 (%)    Eosinophils Absolute 0.0  0.0 - 0.7 (K/uL)    Basophils Relative 0  0 - 1 (%)    Basophils Absolute 0.0  0.0 - 0.1 (K/uL)   BASIC  METABOLIC PANEL     Status: Abnormal   Collection Time   09/13/11 11:06 PM      Component Value Range Comment   Sodium 134 (*) 135 - 145 (mEq/L)    Potassium 4.0  3.5 - 5.1 (mEq/L)    Chloride 99  96 - 112 (mEq/L)    CO2 26  19 - 32 (mEq/L)    Glucose, Bld 127 (*) 70 - 99 (mg/dL)  BUN 15  6 - 23 (mg/dL)    Creatinine, Ser 1.61  0.50 - 1.10 (mg/dL)    Calcium 9.2  8.4 - 10.5 (mg/dL)    GFR calc non Af Amer 58 (*) >90 (mL/min)    GFR calc Af Amer 67 (*) >90 (mL/min)   LIPASE, BLOOD     Status: Abnormal   Collection Time   09/13/11 11:46 PM      Component Value Range Comment   Lipase 74 (*) 11 - 59 (U/L)   HEPATIC FUNCTION PANEL     Status: Abnormal   Collection Time   09/13/11 11:46 PM      Component Value Range Comment   Total Protein 7.1  6.0 - 8.3 (g/dL)    Albumin 3.2 (*) 3.5 - 5.2 (g/dL)    AST 17  0 - 37 (U/L)    ALT 16  0 - 35 (U/L)    Alkaline Phosphatase 69  39 - 117 (U/L)    Total Bilirubin 0.9  0.3 - 1.2 (mg/dL)    Bilirubin, Direct 0.2  0.0 - 0.3 (mg/dL)    Indirect Bilirubin 0.7  0.3 - 0.9 (mg/dL)   PROTIME-INR     Status: Abnormal   Collection Time   09/13/11 11:46 PM      Component Value Range Comment   Prothrombin Time 19.6 (*) 11.6 - 15.2 (seconds)    INR 1.63 (*) 0.00 - 1.49    URINALYSIS, ROUTINE W REFLEX MICROSCOPIC     Status: Abnormal   Collection Time   09/14/11  1:13 AM      Component Value Range Comment   Color, Urine YELLOW  YELLOW     APPearance CLOUDY (*) CLEAR     Specific Gravity, Urine 1.015  1.005 - 1.030     pH 6.5  5.0 - 8.0     Glucose, UA NEGATIVE  NEGATIVE (mg/dL)    Hgb urine dipstick NEGATIVE  NEGATIVE     Bilirubin Urine NEGATIVE  NEGATIVE     Ketones, ur NEGATIVE  NEGATIVE (mg/dL)    Protein, ur 30 (*) NEGATIVE (mg/dL)    Urobilinogen, UA 1.0  0.0 - 1.0 (mg/dL)    Nitrite POSITIVE (*) NEGATIVE     Leukocytes, UA LARGE (*) NEGATIVE    URINE MICROSCOPIC-ADD ON     Status: Abnormal   Collection Time   09/14/11  1:13 AM      Component  Value Range Comment   Squamous Epithelial / LPF MANY (*) RARE     WBC, UA TOO NUMEROUS TO COUNT  <3 (WBC/hpf)    RBC / HPF 0-2  <3 (RBC/hpf)    Bacteria, UA MANY (*) RARE     Dg Abd 2 Views  09/14/2011  *RADIOLOGY REPORT*  Clinical Data: Abdominal pain.  Vomiting.  Nausea.  History of cholecystectomy.  ABDOMEN - 2 VIEW  Comparison: None.  Findings: Dilated loops of small bowel are present within the central abdomen, measuring up to 44 mm.  Air fluid levels are present.  There is no plain film evidence of free air.  Paucity of distal colonic gas.  In the dilated loops of left central abdominal small bowel, there is mild mural thickening.  Cholecystectomy clips are present in the right upper quadrant. Partially visualized pacemaker lead.  IMPRESSION: Dilated loops of small bowel in the left central abdomen with multiple air-fluid levels compatible with small bowel obstruction.  Original Report Authenticated By: Andreas Newport, M.D.  Assessment:  1.  SBO 2.  Nausea and Vomiting 3.  UTI 4.  HTN  Plan:    An NG Tube was placed and patient has been made NPO, pain control therapy and Antiemetics have been ordered PRN.  General Surgery will evaluate the patient later this AM.  A CT scan has been ordered with PO contrast only due to patients allergy and is pending at this time.  IV Rocephin for UTI.  Other plans as per orders.    CODE STATUS:      FULL CODE         Pervis Macintyre C 09/14/2011, 4:10 AM

## 2011-09-14 NOTE — Progress Notes (Signed)
Patient was seen and examined. She is much better, infact had a BM earlier this am. [facility] has seen this patient as well. Will continue with current supportive care. NGT will likely be discontinued later today or in am. Have had a long discussion with family at bedside. Will resume oral anti-hypertensive's. Will resume coumadin in the next day or so. Agree with plan as outlined in the H&P.Will continue with monitor on telemetry.

## 2011-09-14 NOTE — Consult Note (Signed)
Yolanda Spears March 26, 1935  [id number].   Requesting MD: Dr. Lovell Sheehan Chief Complaint/Reason for Consult: SBO HPI: This is a 77 yo WF who has had several abdominal surgeries in the past.  Over the last several years, she has intermittent episodes of mild abd pain, nausea, and emesis that typically resolves in about 24 hours.  This past Saturday she had a recurrent episode.  It started with lower abd pain and then she developed nausea and vomiting.  She continued to have BMs and pass flatus.  She had a BM on Sunday morning.  Due to continued pain and emesis, she came to the ED last night.  She had an abd x-ray that revealed a SBO.  She then got a CT scan that reveals a low grade PSBO with contrast already in her colon.  Since then she has had diarrhea, likely secondary to oral contrast.  Her NGT that was placed last night has been clamped and the patient has no further nausea or vomiting.  We have been asked to evaluate the patient for further recommendations.  Review of Systems: Please see HPI, otherwise all other systems have been reviewed and are negative.  She does admit to TIAs when she gets dehydrated from these episodes.  Family History  Problem Relation Age of Onset  . Cancer Mother     Past Medical History  Diagnosis Date  . Campath-induced atrial fibrillation   . Pacemaker   . Endometrial cancer   . Hypertension   . Breast cancer   . Peripheral neuropathy     Past Surgical History  Procedure Date  . Mastectomy   . Total abdominal hysterectomy w/ bilateral salpingoophorectomy   . Lymph node resection   . Insert / replace / remove pacemaker     Social History:  reports that she quit smoking about 12 years ago. Her smoking use included Cigarettes. She smoked 1 pack per day. She does not have any smokeless tobacco history on file. She reports that she does not drink alcohol or use illicit drugs.  Allergies:  Allergies  Allergen Reactions  . Clindamycin/Lincomycin     heartburn   . Other     Contrast dye   . Penicillins   . Shellfish-Derived Products   . Statins     Myalgias,elevated LFT studies  . Iohexol Hives and Itching    Itching in eyes, one hive on face.    Medications Prior to Admission  Medication Dose Route Frequency Provider Last Rate Last Dose  . 0.9 %  sodium chloride infusion   Intravenous Continuous Ron Parker, MD 75 mL/hr at 09/14/11 [phone]    . acetaminophen (TYLENOL) tablet 650 mg  650 mg Oral Q6H PRN Ron Parker, MD       Or  . acetaminophen (TYLENOL) suppository 650 mg  650 mg Rectal Q6H PRN Ron Parker, MD      . alum & mag hydroxide-simeth (MAALOX/MYLANTA) 200-200-20 MG/5ML suspension 30 mL  30 mL Oral Q6H PRN Ron Parker, MD      . cefTRIAXone (ROCEPHIN) 1 g in dextrose 5 % 50 mL IVPB  1 g Intravenous Q24H Lyanne Co, MD   1 g at 09/14/11 0329  . cefTRIAXone (ROCEPHIN) 1 g in dextrose 5 % 50 mL IVPB  1 g Intravenous Q24H Harvette C Jenkins, MD      . heparin ADULT infusion 100 units/ml (25000 units/250 ml)  1,300 Units/hr Intravenous Continuous [name] Can, PHARMD 13 mL/hr at  09/14/11 0704 1,300 Units/hr at 09/14/11 0704  . HYDROmorphone (DILAUDID) injection 0.5-1 mg  0.5-1 mg Intravenous Q3H PRN Ron Parker, MD      . morphine 4 MG/ML injection 4 mg  4 mg Intravenous Once Lyanne Co, MD   4 mg at 09/13/11 2359  . ondansetron (ZOFRAN) injection 4 mg  4 mg Intravenous Once Lyanne Co, MD   4 mg at 09/13/11 2359  . ondansetron (ZOFRAN) tablet 4 mg  4 mg Oral Q6H PRN Ron Parker, MD       Or  . ondansetron (ZOFRAN) injection 4 mg  4 mg Intravenous Q6H PRN Ron Parker, MD      . oxyCODONE (Oxy IR/ROXICODONE) immediate release tablet 5 mg  5 mg Oral Q4H PRN Ron Parker, MD      . sodium chloride 0.9 % bolus 1,000 mL  1,000 mL Intravenous Once Lyanne Co, MD   1,000 mL at 09/13/11 2359  . zolpidem (AMBIEN) tablet 5 mg  5 mg Oral QHS PRN Ron Parker, MD        . DISCONTD: enoxaparin (LOVENOX) injection 40 mg  40 mg Subcutaneous Q24H Ron Parker, MD       Medications Prior to Admission  Medication Sig Dispense Refill  . Cholecalciferol (VITAMIN D3) 5000 UNITS CAPS Take 1 capsule by mouth daily. Every other day      . ezetimibe (ZETIA) 10 MG tablet Take 0.5 tablets (5 mg total) by mouth daily.  30 tablet  11  . furosemide (LASIX) 20 MG tablet Take 20 mg by mouth daily. 1 TAB QD       . hydrALAZINE (APRESOLINE) 25 MG tablet Take 1 tablet (25 mg total) by mouth 2 (two) times daily.  30 tablet  12  . lisinopril (PRINIVIL,ZESTRIL) 10 MG tablet Take 1 tablet (10 mg total) by mouth 2 (two) times daily.  60 tablet  11  . metoprolol (TOPROL-XL) 100 MG 24 hr tablet TAKE 1 TABLET BY MOUTH EVERY DAY  90 tablet  3  . pregabalin (LYRICA) 75 MG capsule Take 75 mg by mouth 2 (two) times daily.        . propafenone (RYTHMOL) 225 MG tablet Take 225 mg by mouth every 8 (eight) hours.        . Tetrahydrozoline HCl (EYE DROPS OP) Apply to eye as needed.        . warfarin (COUMADIN) 2.5 MG tablet Take 1 tablet (2.5 mg total) by mouth as directed.  32 tablet  11    Blood pressure 182/92, pulse 75, temperature 98.6 F (37 C), temperature source Oral, resp. rate 20, height 5\' 7"  (1.702 m), weight 202 lb (91.627 kg), SpO2 93.00%. Physical Exam: General: pleasant, obese WF who is laying in bed in NAD HEENT: head is normocephalic, atraumatic.  PERRL.  Ears and nose without any obvious masses or lesions.  No rhinorrhea.  NGT present in left nare.  Mouth is pink Heart: regular, rate, and rhythm.  Normal s1, s2.  No murmurs, gallops, or rubs noted.  Palpable radial and pedal pulses bilaterally. Lungs: CTAB, no wheezes, rhonchi, or rales noted.  Respiratory effort is nonlabored Abd: soft, essentially nontender, minimal distention, +BS.  Multiple midline scars are noted from prior laparotomies.  No masses, hernias, or organomegaly noted. MS: all 4 extremities are  symmetrical with no cyanosis, clubbing, or edema Skin: warm and dry with no masses, lesions, or rashes Psych: A&O x 3  with an appropriate affect.   Results for orders placed during the hospital encounter of 09/13/11 (from the past 48 hour(s))  CBC     Status: Normal   Collection Time   09/13/11 11:06 PM      Component Value Range Comment   WBC 8.1  4.0 - 10.5 (K/uL)    RBC 4.54  3.87 - 5.11 (MIL/uL)    Hemoglobin 13.7  12.0 - 15.0 (g/dL)    HCT 16.1  09.6 - 04.5 (%)    MCV 89.9  78.0 - 100.0 (fL)    MCH 30.2  26.0 - 34.0 (pg)    MCHC 33.6  30.0 - 36.0 (g/dL)    RDW 40.9  81.1 - 91.4 (%)    Platelets 207  150 - 400 (K/uL)   DIFFERENTIAL     Status: Abnormal   Collection Time   09/13/11 11:06 PM      Component Value Range Comment   Neutrophils Relative 84 (*) 43 - 77 (%)    Neutro Abs 6.8  1.7 - 7.7 (K/uL)    Lymphocytes Relative 9 (*) 12 - 46 (%)    Lymphs Abs 0.7  0.7 - 4.0 (K/uL)    Monocytes Relative 7  3 - 12 (%)    Monocytes Absolute 0.6  0.1 - 1.0 (K/uL)    Eosinophils Relative 0  0 - 5 (%)    Eosinophils Absolute 0.0  0.0 - 0.7 (K/uL)    Basophils Relative 0  0 - 1 (%)    Basophils Absolute 0.0  0.0 - 0.1 (K/uL)   BASIC METABOLIC PANEL     Status: Abnormal   Collection Time   09/13/11 11:06 PM      Component Value Range Comment   Sodium 134 (*) 135 - 145 (mEq/L)    Potassium 4.0  3.5 - 5.1 (mEq/L)    Chloride 99  96 - 112 (mEq/L)    CO2 26  19 - 32 (mEq/L)    Glucose, Bld 127 (*) 70 - 99 (mg/dL)    BUN 15  6 - 23 (mg/dL)    Creatinine, Ser 7.82  0.50 - 1.10 (mg/dL)    Calcium 9.2  8.4 - 10.5 (mg/dL)    GFR calc non Af Amer 58 (*) >90 (mL/min)    GFR calc Af Amer 67 (*) >90 (mL/min)   LIPASE, BLOOD     Status: Abnormal   Collection Time   09/13/11 11:46 PM      Component Value Range Comment   Lipase 74 (*) 11 - 59 (U/L)   HEPATIC FUNCTION PANEL     Status: Abnormal   Collection Time   09/13/11 11:46 PM      Component Value Range Comment   Total Protein 7.1  6.0 -  8.3 (g/dL)    Albumin 3.2 (*) 3.5 - 5.2 (g/dL)    AST 17  0 - 37 (U/L)    ALT 16  0 - 35 (U/L)    Alkaline Phosphatase 69  39 - 117 (U/L)    Total Bilirubin 0.9  0.3 - 1.2 (mg/dL)    Bilirubin, Direct 0.2  0.0 - 0.3 (mg/dL)    Indirect Bilirubin 0.7  0.3 - 0.9 (mg/dL)   PROTIME-INR     Status: Abnormal   Collection Time   09/13/11 11:46 PM      Component Value Range Comment   Prothrombin Time 19.6 (*) 11.6 - 15.2 (seconds)    INR 1.63 (*) 0.00 -  1.49    URINALYSIS, ROUTINE W REFLEX MICROSCOPIC     Status: Abnormal   Collection Time   09/14/11  1:13 AM      Component Value Range Comment   Color, Urine YELLOW  YELLOW     APPearance CLOUDY (*) CLEAR     Specific Gravity, Urine 1.015  1.005 - 1.030     pH 6.5  5.0 - 8.0     Glucose, UA NEGATIVE  NEGATIVE (mg/dL)    Hgb urine dipstick NEGATIVE  NEGATIVE     Bilirubin Urine NEGATIVE  NEGATIVE     Ketones, ur NEGATIVE  NEGATIVE (mg/dL)    Protein, ur 30 (*) NEGATIVE (mg/dL)    Urobilinogen, UA 1.0  0.0 - 1.0 (mg/dL)    Nitrite POSITIVE (*) NEGATIVE     Leukocytes, UA LARGE (*) NEGATIVE    URINE MICROSCOPIC-ADD ON     Status: Abnormal   Collection Time   09/14/11  1:13 AM      Component Value Range Comment   Squamous Epithelial / LPF MANY (*) RARE     WBC, UA TOO NUMEROUS TO COUNT  <3 (WBC/hpf)    RBC / HPF 0-2  <3 (RBC/hpf)    Bacteria, UA MANY (*) RARE    BASIC METABOLIC PANEL     Status: Abnormal   Collection Time   09/14/11  4:25 AM      Component Value Range Comment   Sodium 136  135 - 145 (mEq/L)    Potassium 4.3  3.5 - 5.1 (mEq/L)    Chloride 101  96 - 112 (mEq/L)    CO2 25  19 - 32 (mEq/L)    Glucose, Bld 116 (*) 70 - 99 (mg/dL)    BUN 15  6 - 23 (mg/dL)    Creatinine, Ser 0.86  0.50 - 1.10 (mg/dL)    Calcium 8.9  8.4 - 10.5 (mg/dL)    GFR calc non Af Amer 61 (*) >90 (mL/min)    GFR calc Af Amer 71 (*) >90 (mL/min)   CBC     Status: Normal   Collection Time   09/14/11  4:25 AM      Component Value Range Comment   WBC 6.8   4.0 - 10.5 (K/uL)    RBC 4.38  3.87 - 5.11 (MIL/uL)    Hemoglobin 13.4  12.0 - 15.0 (g/dL)    HCT 57.8  46.9 - 62.9 (%)    MCV 90.6  78.0 - 100.0 (fL)    MCH 30.6  26.0 - 34.0 (pg)    MCHC 33.8  30.0 - 36.0 (g/dL)    RDW 52.8  41.3 - 24.4 (%)    Platelets 204  150 - 400 (K/uL)   PROTIME-INR     Status: Abnormal   Collection Time   09/14/11  4:31 AM      Component Value Range Comment   Prothrombin Time 20.4 (*) 11.6 - 15.2 (seconds)    INR 1.71 (*) 0.00 - 1.49    APTT     Status: Normal   Collection Time   09/14/11  4:31 AM      Component Value Range Comment   aPTT 29  24 - 37 (seconds)    Ct Abdomen Pelvis Wo Contrast  09/14/2011  *RADIOLOGY REPORT*  Clinical Data: Abdominal pain.  Vomiting.  CT ABDOMEN AND PELVIS WITHOUT CONTRAST  Technique:  Multidetector CT imaging of the abdomen and pelvis was performed following the standard protocol without intravenous  contrast.  Comparison: 09/14/2011 abdominal radiographs.  Findings: Partially visualized pacemaker leads in the heart. Nasogastric tube is present with the tip in the stomach. Unenhanced CT was performed per clinician order.  Lack of IV contrast limits sensitivity and specificity, especially for evaluation of abdominal/pelvic solid viscera.  Liver appears normal.  Old granulomatous disease of the spleen.  Aorto iliofemoral atherosclerosis without aneurysm.  Nonspecific bilateral perinephric stranding.  No urolithiasis.  The ureters appear within normal limits.  Midline abdominal scar is present.  Colonic diverticulosis without diverticulitis.  Urinary bladder appears normal.  Partial small bowel obstruction is present with contrast extending to the colon.  Normal appendix is present.  Mildly dilated small bowel extends into the anatomic pelvis.  There is a transition point in the terminal ileum (image 61 series 2).  The findings are compatible with adhesive small bowel obstruction.  Pancreas appears within normal limits.  Cholecystectomy.  No  aggressive osseous lesions are present.  IMPRESSION: 1.  Adhesive partial small bowel obstruction.  This appears low grade, with oral contrast reaching the colon. 2.  Atherosclerosis. 3.  Old granulomatous disease. 4.  Nasogastric tube with the tip in the stomach.  Original Report Authenticated By: Andreas Newport, M.D.   Dg Abd 2 Views  09/14/2011  *RADIOLOGY REPORT*  Clinical Data: Abdominal pain.  Vomiting.  Nausea.  History of cholecystectomy.  ABDOMEN - 2 VIEW  Comparison: None.  Findings: Dilated loops of small bowel are present within the central abdomen, measuring up to 44 mm.  Air fluid levels are present.  There is no plain film evidence of free air.  Paucity of distal colonic gas.  In the dilated loops of left central abdominal small bowel, there is mild mural thickening.  Cholecystectomy clips are present in the right upper quadrant. Partially visualized pacemaker lead.  IMPRESSION: Dilated loops of small bowel in the left central abdomen with multiple air-fluid levels compatible with small bowel obstruction.  Original Report Authenticated By: Andreas Newport, M.D.       Assessment/Plan 1. Low grade PSBO, likely secondary to adhesive disease Patient Active Problem List  Diagnoses  . Atrial fibrillation  . First degree AV block  . Pacemaker- Dual-chamber-mdt  . Right bundle branch block  . Near syncope  . Benign hypertensive heart disease without heart failure  . TIA (transient ischemic attack)  . Vertigo  . Hypercholesterolemia  . Partial small bowel obstruction   Plan: Currently, the patient's NGT has been clamped for at least 5 hours.  She has no nausea or recurrent emesis.  She has had a large episode of diarrhea already this morning after her CT contrast.  Her CT reveals a low grade PSBO with contrast already in her colon.  I will D/W Dr. Dwain Sarna, but she could likely have her tube removed and try some clear liquids.  The other option would be to give her a full 24 hours of  bowel rest and then advance her tomorrow.  I had a long discussion with the patient and the family (son and sister).  We discussed the possibility of surgery.  I informed them that as long as the patient's symptoms resolve on their own, I would not recommend any type of surgical intervention as she could have complications from her surgery or have recurrent bowel obstructions from newly developed scar tissue.  They understand.  We will follow the patient with you.  Would recommend, OOB and ambulation for the patient as well.  Thank you for this consult.  Zaley Talley E 09/14/2011, 11:14 AM

## 2011-09-14 NOTE — Progress Notes (Signed)
Utilization review completed. Yolanda Spears 09/14/2011 

## 2011-09-14 NOTE — Consult Note (Addendum)
She has had some intermittent symptoms but this episode was worse.  She is now passing flatus and having bowel movements.  tol ng out, tolerating clear liquids.  This has resolved.  Can advance diet as tolerated and send home.  She has recent history of TIA when wasn't on coumadin per her son.  They will need to come to er if she cannot tolerate. She may one day require operation for this but not at this time. Will sign off. Please call back if needed.

## 2011-09-14 NOTE — Progress Notes (Signed)
ANTICOAGULATION CONSULT NOTE - Follow Up Consult  Pharmacy Consult for heparin  Indication: atrial fibrillation  Allergies  Allergen Reactions  . Clindamycin/Lincomycin     heartburn  . Other     Contrast dye   . Penicillins   . Shellfish-Derived Products   . Statins     Myalgias,elevated LFT studies  . Iohexol Hives and Itching    Itching in eyes, one hive on face.    Patient Measurements: Height: 5\' 7"  (170.2 cm) Weight: 202 lb (91.627 kg) IBW/kg (Calculated) : 61.6   Vital Signs: Temp: 98 F (36.7 C) (01/07 1400) Temp src: Oral (01/07 1400) BP: 170/92 mmHg (01/07 1444) Pulse Rate: 69  (01/07 1443)  Labs:  Basename 09/14/11 2004 09/14/11 1137 09/14/11 0431 09/14/11 0425 09/13/11 2346 09/13/11 2306  HGB -- -- -- 13.4 -- 13.7  HCT -- -- -- 39.7 -- 40.8  PLT -- -- -- 204 -- 207  APTT -- -- 29 -- -- --  LABPROT -- -- 20.4* -- 19.6* --  INR -- -- 1.71* -- 1.63* --  HEPARINUNFRC 0.60 0.20* -- -- -- --  CREATININE -- -- -- 0.90 -- 0.95  CKTOTAL -- -- -- -- -- --  CKMB -- -- -- -- -- --  TROPONINI -- -- -- -- -- --   Estimated Creatinine Clearance: 63.7 ml/min (by C-G formula based on Cr of 0.9).    Assessment: 77yo female being evaluated for SBO, on Coumadin PTA for Afib with subtherapeutic INR. Now on IV heparin for anticoagulation. Heparin level 0.6 on 1450 units/hr. CBC stable, no bleeding or other complications noted  Goal of Therapy:  Heparin level 0.3-0.7 units/ml   Plan:  Continue heparin rate of 1450 units/hr, daily HL and CBC Yolanda Spears T 09/14/2011,8:59 PM

## 2011-09-14 NOTE — ED Provider Notes (Addendum)
History     CSN: [id number]  Arrival date & time 09/13/11  2247   First MD Initiated Contact with Patient 09/13/11 2335      Chief Complaint  Patient presents with  . Abdominal Pain     The history is provided by the patient and a relative.   the patient reports development of lower abdominal pain with vomiting that started approximately 12-14 hours ago.  She denies diarrhea.  She denies the ability to pass flatus.  She's had no fevers or chills.  She reports she's intermittently had episodes similar to this where she's had nausea vomiting and crampy abdominal pain however it usually resolves on its own and has never required a trip to the emergency department or intervention.  She reports a history of atrial fibrillation on Coumadin.  She denies black or bloody emesis.  She denies melena and hematochezia.  She has no chest pain shortness of breath.  She's never had a bowel obstruction.    Past Medical History  Diagnosis Date  . Campath-induced atrial fibrillation   . Pacemaker   . Endometrial cancer   . Hypertension   . Breast cancer   . Peripheral neuropathy     Past Surgical History  Procedure Date  . Mastectomy   . Total abdominal hysterectomy w/ bilateral salpingoophorectomy   . Lymph node resection   . Insert / replace / remove pacemaker     Family History  Problem Relation Age of Onset  . Cancer Mother     History  Substance Use Topics  . Smoking status: Former Smoker -- 1.0 packs/day    Types: Cigarettes    Quit date: 11/26/1998  . Smokeless tobacco: Not on file  . Alcohol Use: No    OB History    Grav Para Term Preterm Abortions TAB SAB Ect Mult Living                  Review of Systems  All other systems reviewed and are negative.    Allergies  Clindamycin/lincomycin; Other; Penicillins; Shellfish-derived products; Statins; and Iohexol  Home Medications   Current Outpatient Rx  Name Route Sig Dispense Refill  . VITAMIN D3 5000 UNITS PO  CAPS Oral Take 1 capsule by mouth daily. Every other day    . EZETIMIBE 10 MG PO TABS Oral Take 0.5 tablets (5 mg total) by mouth daily. 30 tablet 11  . FUROSEMIDE 20 MG PO TABS Oral Take 20 mg by mouth daily. 1 TAB QD     . HYDRALAZINE HCL 25 MG PO TABS Oral Take 1 tablet (25 mg total) by mouth 2 (two) times daily. 30 tablet 12  . LISINOPRIL 10 MG PO TABS Oral Take 1 tablet (10 mg total) by mouth 2 (two) times daily. 60 tablet 11  . METOPROLOL SUCCINATE ER 100 MG PO TB24  TAKE 1 TABLET BY MOUTH EVERY DAY 90 tablet 3  . PREGABALIN 75 MG PO CAPS Oral Take 75 mg by mouth 2 (two) times daily.      Marland Kitchen PROPAFENONE HCL 225 MG PO TABS Oral Take 225 mg by mouth every 8 (eight) hours.      Marland Kitchen EYE DROPS OP Ophthalmic Apply to eye as needed.      . WARFARIN SODIUM 2.5 MG PO TABS Oral Take 1 tablet (2.5 mg total) by mouth as directed. 32 tablet 11    BP 160/62  Temp(Src) 98.1 F (36.7 C) (Oral)  Resp 18  SpO2 93%  Physical  Exam  Nursing note and vitals reviewed. Constitutional: She is oriented to person, place, and time. She appears well-developed and well-nourished. No distress.  HENT:  Head: Normocephalic and atraumatic.  Eyes: EOM are normal.  Neck: Normal range of motion.  Cardiovascular: Normal rate, regular rhythm and normal heart sounds.   Pulmonary/Chest: Effort normal and breath sounds normal.  Abdominal: Soft. She exhibits distension. There is no rebound and no guarding.       Very mild generalized diffuse tenderness.  No peritonitis on exam  Musculoskeletal: Normal range of motion.  Neurological: She is alert and oriented to person, place, and time.  Skin: Skin is warm and dry.  Psychiatric: She has a normal mood and affect. Judgment normal.    ED Course  Procedures (including critical care time)   Date: 09/14/2011  Rate: 69  Rhythm: atrial paced  QRS Axis: normal  Intervals: normal  ST/T Wave abnormalities: normal  Conduction Disutrbances: RBBB  Narrative Interpretation:    Old EKG Reviewed: No significant changes noted     Labs Reviewed  DIFFERENTIAL - Abnormal; Notable for the following:    Neutrophils Relative 84 (*)    Lymphocytes Relative 9 (*)    All other components within normal limits  BASIC METABOLIC PANEL - Abnormal; Notable for the following:    Sodium 134 (*)    Glucose, Bld 127 (*)    GFR calc non Af Amer 58 (*)    GFR calc Af Amer 67 (*)    All other components within normal limits  URINALYSIS, ROUTINE W REFLEX MICROSCOPIC - Abnormal; Notable for the following:    APPearance CLOUDY (*)    Protein, ur 30 (*)    Nitrite POSITIVE (*)    Leukocytes, UA LARGE (*)    All other components within normal limits  LIPASE, BLOOD - Abnormal; Notable for the following:    Lipase 74 (*)    All other components within normal limits  HEPATIC FUNCTION PANEL - Abnormal; Notable for the following:    Albumin 3.2 (*)    All other components within normal limits  PROTIME-INR - Abnormal; Notable for the following:    Prothrombin Time 19.6 (*)    INR 1.63 (*)    All other components within normal limits  URINE MICROSCOPIC-ADD ON - Abnormal; Notable for the following:    Squamous Epithelial / LPF MANY (*)    Bacteria, UA MANY (*)    All other components within normal limits  CBC  URINE CULTURE   Dg Abd 2 Views  09/14/2011  *RADIOLOGY REPORT*  Clinical Data: Abdominal pain.  Vomiting.  Nausea.  History of cholecystectomy.  ABDOMEN - 2 VIEW  Comparison: None.  Findings: Dilated loops of small bowel are present within the central abdomen, measuring up to 44 mm.  Air fluid levels are present.  There is no plain film evidence of free air.  Paucity of distal colonic gas.  In the dilated loops of left central abdominal small bowel, there is mild mural thickening.  Cholecystectomy clips are present in the right upper quadrant. Partially visualized pacemaker lead.  IMPRESSION: Dilated loops of small bowel in the left central abdomen with multiple air-fluid levels  compatible with small bowel obstruction.  Original Report Authenticated By: Andreas Newport, M.D.   I personally reviewed the x-ray  1. Small bowel obstruction   2. Urinary tract infection       MDM    2:58 AM I spoke with Dr. Carolynne Edouard with general surgery  who agrees with the NG tube.  He agrees with obtaining a CT scan to better define the cause of her bowel obstruction.  He reports given her medical problems as well as the fact that it is improved before in the past he recommended that the patient be admitted to the medical service and his team will consult first thing in the morning.. I'm awaiting a callback from the hospitalist service at this time.  3:06 AM Spoke with [facility] who will admit the patient to telemetry. NG to be placed      Lyanne Co, MD 09/14/11 1610  Lyanne Co, MD 09/14/11 [phone]

## 2011-09-14 NOTE — Progress Notes (Signed)
ANTICOAGULATION CONSULT NOTE - Initial Consult  Pharmacy Consult for heparin Indication: atrial fibrillation  Allergies  Allergen Reactions  . Clindamycin/Lincomycin     heartburn  . Other     Contrast dye   . Penicillins   . Shellfish-Derived Products   . Statins     Myalgias,elevated LFT studies  . Iohexol Hives and Itching    Itching in eyes, one hive on face.    Patient Measurements: Height: 5\' 7"  (170.2 cm) Weight: 203 lb (92.08 kg) (Copied from Dec 12 OV) IBW/kg (Calculated) : 61.6   Vital Signs: Temp: 97.2 F (36.2 C) (01/07 0120) Temp src: Oral (01/07 0120) BP: 154/63 mmHg (01/07 0120) Pulse Rate: 70  (01/07 0120)  Labs:  Basename 09/13/11 2346 09/13/11 2306  HGB -- 13.7  HCT -- 40.8  PLT -- 207  APTT -- --  LABPROT 19.6* --  INR 1.63* --  HEPARINUNFRC -- --  CREATININE -- 0.95  CKTOTAL -- --  CKMB -- --  TROPONINI -- --   Estimated Creatinine Clearance: 60.5 ml/min (by C-G formula based on Cr of 0.95).  Medical History: Past Medical History  Diagnosis Date  . Campath-induced atrial fibrillation   . Pacemaker   . Endometrial cancer   . Hypertension   . Breast cancer   . Peripheral neuropathy    Assessment: 77yo female being evaluated for SBO, on Coumadin PTA for Afib with subtherapeutic INR, to begin heparin.  Goal of Therapy:  Heparin level 0.3-0.7 units/ml   Plan:  Will begin heparin gtt at 1300 units/hr and monitor heparin levels and CBC.  [name] Can PharmD BCPS 09/14/2011,4:47 AM

## 2011-09-14 NOTE — Progress Notes (Signed)
ANTICOAGULATION CONSULT NOTE - Follow Up Consult  Pharmacy Consult for heparin  Indication: atrial fibrillation  Allergies  Allergen Reactions  . Clindamycin/Lincomycin     heartburn  . Other     Contrast dye   . Penicillins   . Shellfish-Derived Products   . Statins     Myalgias,elevated LFT studies  . Iohexol Hives and Itching    Itching in eyes, one hive on face.    Patient Measurements: Height: 5\' 7"  (170.2 cm) Weight: 202 lb (91.627 kg) IBW/kg (Calculated) : 61.6   Vital Signs: Temp: 98.6 F (37 C) (01/07 0500) BP: 182/92 mmHg (01/07 0500) Pulse Rate: 75  (01/07 0500)  Labs:  Basename 09/14/11 1137 09/14/11 0431 09/14/11 0425 09/13/11 2346 09/13/11 2306  HGB -- -- 13.4 -- 13.7  HCT -- -- 39.7 -- 40.8  PLT -- -- 204 -- 207  APTT -- 29 -- -- --  LABPROT -- 20.4* -- 19.6* --  INR -- 1.71* -- 1.63* --  HEPARINUNFRC 0.20* -- -- -- --  CREATININE -- -- 0.90 -- 0.95  CKTOTAL -- -- -- -- --  CKMB -- -- -- -- --  TROPONINI -- -- -- -- --   Estimated Creatinine Clearance: 63.7 ml/min (by C-G formula based on Cr of 0.9).   Medications:  Scheduled:    . cefTRIAXone (ROCEPHIN)  IV  1 g Intravenous Q24H  . cefTRIAXone (ROCEPHIN)  IV  1 g Intravenous Q24H  . hydrALAZINE  25 mg Oral BID  . lisinopril  10 mg Oral BID  . metoprolol  100 mg Oral Daily  .  morphine injection  4 mg Intravenous Once  . ondansetron (ZOFRAN) IV  4 mg Intravenous Once  . sodium chloride  1,000 mL Intravenous Once  . DISCONTD: enoxaparin  40 mg Subcutaneous Q24H    Assessment: 77yo female being evaluated for SBO, on Coumadin PTA for Afib with subtherapeutic INR. Now on IV heparin for anticoagulation. Heparin level subtherapeutic on 1300 units/hr. CBC stable, no bleeding or other complications noted  Goal of Therapy:  Heparin level 0.3-0.7 units/ml   Plan:  Increase heparin rate to 1450 units/hr F/u heparin level at 2030  Riki Rusk 09/14/2011,1:36 PM

## 2011-09-15 DIAGNOSIS — I499 Cardiac arrhythmia, unspecified: Secondary | ICD-10-CM

## 2011-09-15 LAB — PROTIME-INR
INR: 2.3 — ABNORMAL HIGH (ref 0.00–1.49)
INR: 2.28 — ABNORMAL HIGH (ref 0.00–1.49)
Prothrombin Time: 25.7 seconds — ABNORMAL HIGH (ref 11.6–15.2)
Prothrombin Time: 25.5 seconds — ABNORMAL HIGH (ref 11.6–15.2)

## 2011-09-15 LAB — CBC
MCH: 29.8 pg (ref 26.0–34.0)
MCHC: 32.5 g/dL (ref 30.0–36.0)
MCV: 91.6 fL (ref 78.0–100.0)
Platelets: 131 10*3/uL — ABNORMAL LOW (ref 150–400)
RDW: 14.1 % (ref 11.5–15.5)

## 2011-09-15 MED ORDER — WARFARIN SODIUM 2 MG PO TABS
2.0000 mg | ORAL_TABLET | Freq: Once | ORAL | Status: AC
  Administered 2011-09-15: 2 mg via ORAL
  Filled 2011-09-15: qty 1

## 2011-09-15 MED ORDER — PREGABALIN 50 MG PO CAPS
75.0000 mg | ORAL_CAPSULE | Freq: Two times a day (BID) | ORAL | Status: DC
  Administered 2011-09-15 – 2011-09-16 (×2): 75 mg via ORAL
  Filled 2011-09-15: qty 1
  Filled 2011-09-15: qty 3

## 2011-09-15 NOTE — Progress Notes (Signed)
Pt was A paced on telemetry and started Afib with V pacing on demand.  Noted extra pacer spikes - ? Failing to sense. Pt awakened from sleep - pt asymptomatic with stable VS.  Pt states she did her telephone check from home on 09/10/11 and has not heard back from that - she also states Dr. Graciela Husbands follows her pacemaker.  EKG obtained at 2357 and notified Donnamarie Poag with [facility] hospitalist.  No new orders received.  Strips and EKG placed on chart.  Will continue to monitor.  Westley Chandler, Charity fundraiser.

## 2011-09-15 NOTE — Progress Notes (Signed)
   ELECTROPHYSIOLOGY DEVICE INTERROGATION   Asked to see patient because of abnormalities on telemetry.  Device Manufacturer: Medtronic Model Number: [id number]  DOI: [id number] Implanting physician: Clovis Cao  Device following physician: Berton Mount, MD  Battery Voltage: 2.95V (ERI-2.81V)     RA Lead (5076) RV Lead (5076)    Amplitude 1.76mV 4.44mV  Impedence 504 416  Threshold 0.5V @ 0.25ms 0.5V @ 0.105ms   Episodes:  High Atrial rates: 162  Time in AF since last interrogation (11-26-2010)- 1.7%  Pace terminated episodes 78 of 161 (48.4%)  High Ventricular rates: 0   Programmed parameters:   Brady parameters:  Mode: MVP(R) Lower Rate: 55 Upper rate: 130  PAV: 180   SAV: 150  Tachy parameters (atrial):  AT/AF on at >171 with burst then ramp pacing    Changes this session: No changes made this session.  Patient is followed on [facility] and in clinic by Dr Graciela Husbands.     Gypsy Balsam, RN, BSN, CCDS 09/15/2011 9:06 AM  Interrogation reviewed and no changes made Normal device function Telemetry has documented atrial antitachycardia pacing which is a normal device programming feature. Pt will follow-up with Dr Graciela Husbands as scheduled.  Katlin Ciszewski,MD 2:44 PM 09/15/2011

## 2011-09-15 NOTE — Progress Notes (Addendum)
ANTICOAGULATION CONSULT NOTE - Follow Up Consult  Pharmacy Consult for heparin/coumadin Indication: atrial fibrillation  Allergies  Allergen Reactions  . Clindamycin/Lincomycin     heartburn  . Other     Contrast dye   . Penicillins   . Shellfish-Derived Products   . Statins     Myalgias,elevated LFT studies  . Iohexol Hives and Itching    Itching in eyes, one hive on face.    Patient Measurements: Height: 5\' 7"  (170.2 cm) Weight: 202 lb (91.627 kg) IBW/kg (Calculated) : 61.6   Vital Signs: Temp: 98.2 F (36.8 C) (01/08 0500) Temp src: Oral (01/08 0500) BP: 121/56 mmHg (01/08 0500) Pulse Rate: 65  (01/08 0500)  Labs:  Alvira Philips 09/15/11 0629 09/14/11 2004 09/14/11 1137 09/14/11 0431 09/14/11 0425 09/13/11 2346 09/13/11 2306  HGB 11.0* -- -- -- 13.4 -- --  HCT 33.8* -- -- -- 39.7 -- 40.8  PLT 131* -- -- -- 204 -- 207  APTT 135* -- -- 29 -- -- --  LABPROT 25.7* -- -- 20.4* -- 19.6* --  INR 2.30* -- -- 1.71* -- 1.63* --  HEPARINUNFRC 0.46 0.60 0.20* -- -- -- --  CREATININE -- -- -- -- 0.90 -- 0.95  CKTOTAL -- -- -- -- -- -- --  CKMB -- -- -- -- -- -- --  TROPONINI -- -- -- -- -- -- --   Estimated Creatinine Clearance: 63.7 ml/min (by C-G formula based on Cr of 0.9).  Assessment: 77yo female being evaluated for SBO, on Coumadin PTA for Afib. Patient has been on IV heparin for anticoagulation since admission. Heparin level therapeutic on 1450 units/hr, no bleeding or other complications noted. INR increased to 2.3 from 1.71, although no coumadin given in the past 2 days. I suspect it is a lab error. Will resume coumadin today.  Goal of Therapy:  Heparin level 0.3-0.7 units/ml INR 2-3   Plan:  Recheck INR, will D/C heparin infusion if INR > 2   Goro Wenrick Wang 09/15/2011,8:42 AM  Addendum:  Recheck INR 2.28 (1.71 yesterday), patient has not taken coumadin in the past 2 days, LFTs wnl, unclear why INR is elevated.   Plan:  1. D/C heparin infusion given INR is  therapeutic 2. Coumadin 2mg  po x 1

## 2011-09-15 NOTE — Progress Notes (Signed)
PATIENT DETAILS Name: Yolanda Spears Age: 77 y.o. Sex: female Date of Birth: [birth year] Admit Date: 09/13/2011 WUJ:[email or web address],NFAOZ, MD, MD  Subjective: Better, NG tube out, tolerating clears so far-anxious for the diet to be advanced.  Objective: Vital signs in last 24 hours: Filed Vitals:   09/15/11 0500 09/15/11 1046 09/15/11 1047 09/15/11 1400  BP: 121/56 140/78 140/78 142/65  Pulse: 65  65 65  Temp: 98.2 F (36.8 C)   97.9 F (36.6 C)  TempSrc: Oral   Oral  Resp: 18   20  Height:      Weight:      SpO2: 97%   95%    Weight change:   Body mass index is 31.64 kg/(m^2).  Intake/Output from previous day:  Intake/Output Summary (Last 24 hours) at 09/15/11 1753 Last data filed at 09/15/11 1200  Gross per 24 hour  Intake 2308.16 ml  Output      0 ml  Net 2308.16 ml    PHYSICAL EXAM: Gen Exam: Awake and alert with clear speech.   Neck: Supple, No JVD.   Chest: B/L Clear.   CVS: S1 S2 Regular, no murmurs.  Abdomen: soft, BS +, non tender, non distended. Extremities: no edema, lower extremities warm to touch. Neurologic: Non Focal.   Skin: No Rash.   Wounds: N/A.    CONSULTS:  cardiology and general surgery  LAB RESULTS: CBC  Lab 09/15/11 0629 09/14/11 0425 09/13/11 2306  WBC 4.6 6.8 8.1  HGB 11.0* 13.4 13.7  HCT 33.8* 39.7 40.8  PLT 131* 204 207  MCV 91.6 90.6 89.9  MCH 29.8 30.6 30.2  MCHC 32.5 33.8 33.6  RDW 14.1 13.9 14.0  LYMPHSABS -- -- 0.7  MONOABS -- -- 0.6  EOSABS -- -- 0.0  BASOSABS -- -- 0.0  BANDABS -- -- --    Chemistries   Lab 09/14/11 0425 09/13/11 2306  NA 136 134*  K 4.3 4.0  CL 101 99  CO2 25 26  GLUCOSE 116* 127*  BUN 15 15  CREATININE 0.90 0.95  CALCIUM 8.9 9.2  MG -- --    GFR Estimated Creatinine Clearance: 63.7 ml/min (by C-G formula based on Cr of 0.9).  Coagulation profile  Lab 09/15/11 0904 09/15/11 0629 09/14/11 0431 09/13/11 2346  INR 2.28* 2.30* 1.71* 1.63*  PROTIME -- -- -- --    Cardiac  Enzymes No results found for this basename: CK:3,CKMB:3,TROPONINI:3,MYOGLOBIN:3 in the last 168 hours  No components found with this basename: POCBNP:3 No results found for this basename: DDIMER:2 in the last 72 hours No results found for this basename: HGBA1C:2 in the last 72 hours No results found for this basename: CHOL:2,HDL:2,LDLCALC:2,TRIG:2,CHOLHDL:2,LDLDIRECT:2 in the last 72 hours No results found for this basename: TSH,T4TOTAL,FREET3,T3FREE,THYROIDAB in the last 72 hours No results found for this basename: VITAMINB12:2,FOLATE:2,FERRITIN:2,TIBC:2,IRON:2,RETICCTPCT:2 in the last 72 hours  Basename 09/13/11 2346  LIPASE 74*  AMYLASE --    Urine Studies No results found for this basename: UACOL:2,UAPR:2,USPG:2,UPH:2,UTP:2,UGL:2,UKET:2,UBIL:2,UHGB:2,UNIT:2,UROB:2,ULEU:2,UEPI:2,UWBC:2,URBC:2,UBAC:2,CAST:2,CRYS:2,UCOM:2,BILUA:2 in the last 72 hours  MICROBIOLOGY: Recent Results (from the past 240 hour(s))  URINE CULTURE     Status: Normal (Preliminary result)   Collection Time   09/14/11  1:13 AM      Component Value Range Status Comment   Specimen Description URINE, RANDOM   Final    Special Requests ADDED 0323   Final    Setup Time [id number]   Final    Colony Count >=100,000 COLONIES/ML   Final    Culture ESCHERICHIA COLI  Final    Report Status PENDING   Incomplete     RADIOLOGY STUDIES/RESULTS: Ct Abdomen Pelvis Wo Contrast  09/14/2011  *RADIOLOGY REPORT*  Clinical Data: Abdominal pain.  Vomiting.  CT ABDOMEN AND PELVIS WITHOUT CONTRAST  Technique:  Multidetector CT imaging of the abdomen and pelvis was performed following the standard protocol without intravenous contrast.  Comparison: 09/14/2011 abdominal radiographs.  Findings: Partially visualized pacemaker leads in the heart. Nasogastric tube is present with the tip in the stomach. Unenhanced CT was performed per clinician order.  Lack of IV contrast limits sensitivity and specificity, especially for evaluation of  abdominal/pelvic solid viscera.  Liver appears normal.  Old granulomatous disease of the spleen.  Aorto iliofemoral atherosclerosis without aneurysm.  Nonspecific bilateral perinephric stranding.  No urolithiasis.  The ureters appear within normal limits.  Midline abdominal scar is present.  Colonic diverticulosis without diverticulitis.  Urinary bladder appears normal.  Partial small bowel obstruction is present with contrast extending to the colon.  Normal appendix is present.  Mildly dilated small bowel extends into the anatomic pelvis.  There is a transition point in the terminal ileum (image 61 series 2).  The findings are compatible with adhesive small bowel obstruction.  Pancreas appears within normal limits.  Cholecystectomy.  No aggressive osseous lesions are present.  IMPRESSION: 1.  Adhesive partial small bowel obstruction.  This appears low grade, with oral contrast reaching the colon. 2.  Atherosclerosis. 3.  Old granulomatous disease. 4.  Nasogastric tube with the tip in the stomach.  Original Report Authenticated By: Andreas Newport, M.D.   Dg Abd 2 Views  09/14/2011  *RADIOLOGY REPORT*  Clinical Data: Abdominal pain.  Vomiting.  Nausea.  History of cholecystectomy.  ABDOMEN - 2 VIEW  Comparison: None.  Findings: Dilated loops of small bowel are present within the central abdomen, measuring up to 44 mm.  Air fluid levels are present.  There is no plain film evidence of free air.  Paucity of distal colonic gas.  In the dilated loops of left central abdominal small bowel, there is mild mural thickening.  Cholecystectomy clips are present in the right upper quadrant. Partially visualized pacemaker lead.  IMPRESSION: Dilated loops of small bowel in the left central abdomen with multiple air-fluid levels compatible with small bowel obstruction.  Original Report Authenticated By: Andreas Newport, M.D.    MEDICATIONS: Scheduled Meds:   . cefTRIAXone (ROCEPHIN)  IV  1 g Intravenous Q24H  . hydrALAZINE   25 mg Oral BID  . lisinopril  10 mg Oral BID  . metoprolol  100 mg Oral Daily  . propafenone  225 mg Oral Q8H  . warfarin  2 mg Oral ONCE-1800   Continuous Infusions:   . sodium chloride 10 mL/hr at 09/15/11 0904  . DISCONTD: heparin 1,450 Units/hr (09/14/11 2239)   PRN Meds:.acetaminophen, acetaminophen, alum & mag hydroxide-simeth, hydrALAZINE, HYDROmorphone, ondansetron (ZOFRAN) IV, ondansetron, oxyCODONE, zolpidem  Antibiotics: Anti-infectives     Start     Dose/Rate Route Frequency Ordered Stop   09/14/11 0600   cefTRIAXone (ROCEPHIN) 1 g in dextrose 5 % 50 mL IVPB  Status:  Discontinued        1 g 100 mL/hr over 30 Minutes Intravenous Every 24 hours 09/14/11 0552 09/14/11 1343   09/14/11 0230   cefTRIAXone (ROCEPHIN) 1 g in dextrose 5 % 50 mL IVPB        1 g 100 mL/hr over 30 Minutes Intravenous Every 24 hours 09/14/11 0224  Assessment/Plan: Patient Active Hospital Problem List: Partial small bowel obstruction    Assessment: Resolved, likely secondary to adhesions from prior laparotomies.    Plan: Advance diet, if tolerates can discharge home tomorrow morning.  Atrial fibrillation  Assessment: Chronic, paced rhythm at times.  Plan: Resume Coumadin, DC heparin infusion as INR is therapeutic. Continue with Rythmol.  Hypertension  Assessment: Moderately well controlled Plan: Continue with hydralazine, Cipro and metoprolol.  Urinary tract infection  Assessment: Afebrile and not toxic looking.  Plan: Urine culture growing Escherichia coli, sensitivities still pending. Continue with empiric Rocephin for now  History of prior TIAs  Assessment: Apparently this has happened when patient has not been taking Coumadin because of  SBO-in a setting of chronic atrial fibrillation.  Plan: Continue with Coumadin-pharmacy dosing  History of peripheral neuropathy  Assessment: Stable Plan: Continue with Lyrica.  Disposition: Remain inpatient-possible discharge home  tomorrow if Tolerates regular diet  DVT Prophylaxis: Not needed as on Coumadin  Code Status:  full code  Maretta Bees,  MD. 09/15/2011, 5:53 PM

## 2011-09-16 ENCOUNTER — Inpatient Hospital Stay (HOSPITAL_COMMUNITY): Payer: Medicare Other

## 2011-09-16 LAB — APTT: aPTT: 37 seconds (ref 24–37)

## 2011-09-16 LAB — CBC
HCT: 33 % — ABNORMAL LOW (ref 36.0–46.0)
MCH: 30.1 pg (ref 26.0–34.0)
MCV: 91.2 fL (ref 78.0–100.0)
Platelets: 132 10*3/uL — ABNORMAL LOW (ref 150–400)
WBC: 4.4 10*3/uL (ref 4.0–10.5)

## 2011-09-16 LAB — PROTIME-INR: INR: 2.31 — ABNORMAL HIGH (ref 0.00–1.49)

## 2011-09-16 MED ORDER — WARFARIN SODIUM 2.5 MG PO TABS
2.5000 mg | ORAL_TABLET | Freq: Once | ORAL | Status: DC
  Filled 2011-09-16: qty 1

## 2011-09-16 NOTE — Discharge Summary (Signed)
Physician Discharge Summary  Yolanda Spears:[id number] DOB: [birth year] DOA: 09/13/2011  PCP: Thayer Headings, MD, MD  Admit date: 09/13/2011 Discharge date: 09/16/2011  Discharge Diagnoses:  1. Small bowel obstruction, resolved 2. Urinary tract infection 3. Stable hypertension  Discharge Condition: Improved  Disposition: Home  History of present illness:  77 year old woman present with abdominal pain. She is admitted for small bowel obstruction, nausea and vomiting, urinary tract infection.  Hospital Course:  Ms. Vessey was admitted medical floor. She was treated with supportive care. She was seen in consultation with Gen. surgery. Her bowel obstruction resolved quickly and she did not require any intervention. She was cleared by general surgery for discharge. She is eating well and like to go home. These and other issues as below.  1. Small bowel obstruction/nausea/vomiting: Resolved.  2. Urinary tract infection: Completed treatment with Rocephin.  3. Hypertension: Stable.  4. Atrial fibrillation: Continue warfarin.  5. History of TIAs: Stable.  Consultants:  General surgery  Procedures:  None  Discharge Instructions  Discharge Orders    Future Appointments: Provider: Department: Dept Phone: Center:   09/21/2011 10:00 AM [facility] [phone] [place]   09/21/2011 2:00 PM [facility] [phone] [place]   09/22/2011 10:00 AM [facility] [phone] [place]   09/30/2011 2:15 PM Raul Del, RN [facility] [phone] None   11/27/2011 2:15 PM Duke Salvia, MD [facility] [phone] [facility]     Future Orders Please Complete By Expires   Diet - low sodium heart healthy      Activity as tolerated - No restrictions        Current Discharge Medication List    CONTINUE these medications which have NOT CHANGED   Details  Cholecalciferol (VITAMIN D3) 5000 UNITS CAPS Take 1 capsule by mouth  daily. Every other day    ezetimibe (ZETIA) 10 MG tablet Take 0.5 tablets (5 mg total) by mouth daily. Qty: 30 tablet, Refills: 11   Associated Diagnoses: Hyperlipidemia    furosemide (LASIX) 20 MG tablet Take 20 mg by mouth daily. 1 TAB QD     hydrALAZINE (APRESOLINE) 25 MG tablet Take 1 tablet (25 mg total) by mouth 2 (two) times daily. Qty: 30 tablet, Refills: 12   Associated Diagnoses: Benign hypertensive heart disease without heart failure    lisinopril (PRINIVIL,ZESTRIL) 10 MG tablet Take 1 tablet (10 mg total) by mouth 2 (two) times daily. Qty: 60 tablet, Refills: 11    metoprolol (TOPROL-XL) 100 MG 24 hr tablet TAKE 1 TABLET BY MOUTH EVERY DAY Qty: 90 tablet, Refills: 3   Associated Diagnoses: HTN (hypertension)    pregabalin (LYRICA) 75 MG capsule Take 75 mg by mouth 2 (two) times daily.      propafenone (RYTHMOL) 225 MG tablet Take 225 mg by mouth every 8 (eight) hours.      Tetrahydrozoline HCl (EYE DROPS OP) Apply to eye as needed.      warfarin (COUMADIN) 2.5 MG tablet Take 1 tablet (2.5 mg total) by mouth as directed. Qty: 32 tablet, Refills: 11       Follow-up Information    Follow up with Thayer Headings, MD in 1 week.          The results of significant diagnostics from this hospitalization (including imaging, microbiology, ancillary and laboratory) are listed below for reference.    Significant Diagnostic Studies: Ct Abdomen Pelvis Wo Contrast  09/14/2011  *RADIOLOGY REPORT*  Clinical Data: Abdominal pain.  Vomiting.  CT ABDOMEN AND PELVIS WITHOUT CONTRAST  Technique:  Multidetector CT imaging of the abdomen and pelvis was performed following the standard protocol without intravenous contrast.  Comparison: 09/14/2011 abdominal radiographs.  Findings: Partially visualized pacemaker leads in the heart. Nasogastric tube is present with the tip in the stomach. Unenhanced CT was performed per clinician order.  Lack of IV contrast limits sensitivity and  specificity, especially for evaluation of abdominal/pelvic solid viscera.  Liver appears normal.  Old granulomatous disease of the spleen.  Aorto iliofemoral atherosclerosis without aneurysm.  Nonspecific bilateral perinephric stranding.  No urolithiasis.  The ureters appear within normal limits.  Midline abdominal scar is present.  Colonic diverticulosis without diverticulitis.  Urinary bladder appears normal.  Partial small bowel obstruction is present with contrast extending to the colon.  Normal appendix is present.  Mildly dilated small bowel extends into the anatomic pelvis.  There is a transition point in the terminal ileum (image 61 series 2).  The findings are compatible with adhesive small bowel obstruction.  Pancreas appears within normal limits.  Cholecystectomy.  No aggressive osseous lesions are present.  IMPRESSION: 1.  Adhesive partial small bowel obstruction.  This appears low grade, with oral contrast reaching the colon. 2.  Atherosclerosis. 3.  Old granulomatous disease. 4.  Nasogastric tube with the tip in the stomach.  Original Report Authenticated By: Andreas Newport, M.D.   Dg Abd 2 Views  09/14/2011  *RADIOLOGY REPORT*  Clinical Data: Abdominal pain.  Vomiting.  Nausea.  History of cholecystectomy.  ABDOMEN - 2 VIEW  Comparison: None.  Findings: Dilated loops of small bowel are present within the central abdomen, measuring up to 44 mm.  Air fluid levels are present.  There is no plain film evidence of free air.  Paucity of distal colonic gas.  In the dilated loops of left central abdominal small bowel, there is mild mural thickening.  Cholecystectomy clips are present in the right upper quadrant. Partially visualized pacemaker lead.  IMPRESSION: Dilated loops of small bowel in the left central abdomen with multiple air-fluid levels compatible with small bowel obstruction.  Original Report Authenticated By: Andreas Newport, M.D.    Microbiology: Recent Results (from the past 240 hour(s))    URINE CULTURE     Status: Normal   Collection Time   09/14/11  1:13 AM      Component Value Range Status Comment   Specimen Description URINE, RANDOM   Final    Special Requests ADDED 0323   Final    Setup Time [id number]   Final    Colony Count >=100,000 COLONIES/ML   Final    Culture ESCHERICHIA COLI   Final    Report Status 09/16/2011 FINAL   Final    Organism ID, Bacteria ESCHERICHIA COLI   Final      Labs: Basic Metabolic Panel:  Lab 09/14/11 9562 09/13/11 2306  NA 136 134*  K 4.3 4.0  CL 101 99  CO2 25 26  GLUCOSE 116* 127*  BUN 15 15  CREATININE 0.90 0.95  CALCIUM 8.9 9.2  MG -- --  PHOS -- --   Liver Function Tests:  Lab 09/13/11 2346  AST 17  ALT 16  ALKPHOS 69  BILITOT 0.9  PROT 7.1  ALBUMIN 3.2*    Lab 09/13/11 2346  LIPASE 74*  AMYLASE --   CBC:  Lab 09/16/11 0540 09/15/11 0629 09/14/11 0425 09/13/11 2306  WBC 4.4 4.6 6.8 8.1  NEUTROABS -- -- -- 6.8  HGB 10.9*  11.0* 13.4 13.7  HCT 33.0* 33.8* 39.7 40.8  MCV 91.2 91.6 90.6 89.9  PLT 132* 131* 204 207   Time coordinating discharge: 25 minutes.  Signed:  Brendia Sacks, MD  [facility] 09/16/2011, 5:16 PM

## 2011-09-16 NOTE — Progress Notes (Signed)
Pt provided with discharge instructions. Verbalized understanding to follow up with Dr Thea Silversmith in 1 week and the need to make an appointment. Pt was reeducated on her medications and which ones to take tonight. No questions at this time. IV removed. VSS. Pt is awaiting ride in the room. Ramond Craver, RN

## 2011-09-16 NOTE — Progress Notes (Signed)
PROGRESS NOTE  JENAVIEVE FREDA ZOX:[id number] DOB: 28-Apr-1935 DOA: 09/13/2011 PCP: Thayer Headings, MD, MD  Brief narrative: 77 year old woman present with abdominal pain. She is admitted for small bowel obstruction, nausea and vomiting, urinary tract infection.  Past medical history: Atrial fibrillation, pacemaker, endometrial cancer, hypertension, breast cancer, peripheral neuropathy  Consultants:  General surgery  Procedures:  None  Interim History: Interval documentation reviewed. Subjective: Feels fine. No pain. Eating well. Wants to go home.  Objective: Filed Vitals:   09/15/11 2200 09/16/11 0500 09/16/11 1008 09/16/11 1300  BP: 161/71 152/75 160/68 163/7  Pulse: 63 68  66  Temp: 97.9 F (36.6 C) 98.5 F (36.9 C)  97.9 F (36.6 C)  TempSrc:    Oral  Resp: 18 18  20   Height:      Weight:      SpO2: 95% 93%  92%    Intake/Output Summary (Last 24 hours) at 09/16/11 1654 Last data filed at 09/16/11 1300  Gross per 24 hour  Intake 1995.66 ml  Output      0 ml  Net 1995.66 ml    Exam:  General: Appears calm and comfortable. Sitting on side of bed eating dinner. Cardiovascular: Regular rate and rhythm. No murmur, rub or gallop. Respiratory: Clear to auscultation bilaterally. No wheezes, rales or rhonchi. Normal respiratory effort. Abdomen: Soft. Positive bowel sounds.  Data Reviewed: Basic Metabolic Panel:  Lab 09/14/11 8119 09/13/11 2306  NA 136 134*  K 4.3 4.0  CL 101 99  CO2 25 26  GLUCOSE 116* 127*  BUN 15 15  CREATININE 0.90 0.95  CALCIUM 8.9 9.2  MG -- --  PHOS -- --   Liver Function Tests:  Lab 09/13/11 2346  AST 17  ALT 16  ALKPHOS 69  BILITOT 0.9  PROT 7.1  ALBUMIN 3.2*    Lab 09/13/11 2346  LIPASE 74*  AMYLASE --   CBC:  Lab 09/16/11 0540 09/15/11 0629 09/14/11 0425 09/13/11 2306  WBC 4.4 4.6 6.8 8.1  NEUTROABS -- -- -- 6.8  HGB 10.9* 11.0* 13.4 13.7  HCT 33.0* 33.8* 39.7 40.8  MCV 91.2 91.6 90.6 89.9  PLT 132* 131* 204  207    Recent Results (from the past 240 hour(s))  URINE CULTURE     Status: Normal   Collection Time   09/14/11  1:13 AM      Component Value Range Status Comment   Specimen Description URINE, RANDOM   Final    Special Requests ADDED 0323   Final    Setup Time [id number]   Final    Colony Count >=100,000 COLONIES/ML   Final    Culture ESCHERICHIA COLI   Final    Report Status 09/16/2011 FINAL   Final    Organism ID, Bacteria ESCHERICHIA COLI   Final      Studies: Ct Abdomen Pelvis Wo Contrast  09/14/2011  *RADIOLOGY REPORT*  Clinical Data: Abdominal pain.  Vomiting.  CT ABDOMEN AND PELVIS WITHOUT CONTRAST  Technique:  Multidetector CT imaging of the abdomen and pelvis was performed following the standard protocol without intravenous contrast.  Comparison: 09/14/2011 abdominal radiographs.  Findings: Partially visualized pacemaker leads in the heart. Nasogastric tube is present with the tip in the stomach. Unenhanced CT was performed per clinician order.  Lack of IV contrast limits sensitivity and specificity, especially for evaluation of abdominal/pelvic solid viscera.  Liver appears normal.  Old granulomatous disease of the spleen.  Aorto iliofemoral atherosclerosis without aneurysm.  Nonspecific bilateral perinephric stranding.  No urolithiasis.  The ureters appear within normal limits.  Midline abdominal scar is present.  Colonic diverticulosis without diverticulitis.  Urinary bladder appears normal.  Partial small bowel obstruction is present with contrast extending to the colon.  Normal appendix is present.  Mildly dilated small bowel extends into the anatomic pelvis.  There is a transition point in the terminal ileum (image 61 series 2).  The findings are compatible with adhesive small bowel obstruction.  Pancreas appears within normal limits.  Cholecystectomy.  No aggressive osseous lesions are present.  IMPRESSION: 1.  Adhesive partial small bowel obstruction.  This appears low grade, with  oral contrast reaching the colon. 2.  Atherosclerosis. 3.  Old granulomatous disease. 4.  Nasogastric tube with the tip in the stomach.  Original Report Authenticated By: Andreas Newport, M.D.   Dg Abd 2 Views  09/14/2011  *RADIOLOGY REPORT*  Clinical Data: Abdominal pain.  Vomiting.  Nausea.  History of cholecystectomy.  ABDOMEN - 2 VIEW  Comparison: None.  Findings: Dilated loops of small bowel are present within the central abdomen, measuring up to 44 mm.  Air fluid levels are present.  There is no plain film evidence of free air.  Paucity of distal colonic gas.  In the dilated loops of left central abdominal small bowel, there is mild mural thickening.  Cholecystectomy clips are present in the right upper quadrant. Partially visualized pacemaker lead.  IMPRESSION: Dilated loops of small bowel in the left central abdomen with multiple air-fluid levels compatible with small bowel obstruction.  Original Report Authenticated By: Andreas Newport, M.D.    Scheduled Meds:   . cefTRIAXone (ROCEPHIN)  IV  1 g Intravenous Q24H  . hydrALAZINE  25 mg Oral BID  . lisinopril  10 mg Oral BID  . metoprolol  100 mg Oral Daily  . pregabalin  75 mg Oral BID  . propafenone  225 mg Oral Q8H  . warfarin  2 mg Oral ONCE-1800  . warfarin  2.5 mg Oral ONCE-1800   Continuous Infusions:   . sodium chloride 10 mL/hr at 09/15/11 0904     Assessment/Plan: 1. Small bowel obstruction/nausea/vomiting: Resolved. 2. Urinary tract infection: Completed treatment with Rocephin. 3. Hypertension: Stable. 4. Atrial fibrillation: Continue warfarin. 5. History of TIAs: Stable.  Disposition Plan: Home today.   Brendia Sacks, MD  [facility] Pager [phone] 09/16/2011, 4:54 PM    LOS: 3 days

## 2011-09-16 NOTE — Progress Notes (Signed)
ANTICOAGULATION CONSULT NOTE - Follow Up Consult  Pharmacy Consult for Coumadin Indication: atrial fibrillation  Allergies  Allergen Reactions  . Clindamycin/Lincomycin     heartburn  . Other     Contrast dye   . Penicillins   . Shellfish-Derived Products   . Statins     Myalgias,elevated LFT studies  . Iohexol Hives and Itching    Itching in eyes, one hive on face.    Patient Measurements: Height: 5\' 7"  (170.2 cm) Weight: 202 lb (91.627 kg) IBW/kg (Calculated) : 61.6   Vital Signs: Temp: 98.5 F (36.9 C) (01/09 0500) BP: 152/75 mmHg (01/09 0500) Pulse Rate: 68  (01/09 0500)  Labs:  Yolanda Spears 09/16/11 0540 09/15/11 0904 09/15/11 0629 09/14/11 2004 09/14/11 1137 09/14/11 0431 09/14/11 0425 09/13/11 2306  HGB 10.9* -- 11.0* -- -- -- -- --  HCT 33.0* -- 33.8* -- -- -- 39.7 --  PLT 132* -- 131* -- -- -- 204 --  APTT 37 -- 135* -- -- 29 -- --  LABPROT 25.8* 25.5* 25.7* -- -- -- -- --  INR 2.31* 2.28* 2.30* -- -- -- -- --  HEPARINUNFRC -- -- 0.46 0.60 0.20* -- -- --  CREATININE -- -- -- -- -- -- 0.90 0.95  CKTOTAL -- -- -- -- -- -- -- --  CKMB -- -- -- -- -- -- -- --  TROPONINI -- -- -- -- -- -- -- --   Estimated Creatinine Clearance: 63.7 ml/min (by C-G formula based on Cr of 0.9).   Medications:  Scheduled:    . cefTRIAXone (ROCEPHIN)  IV  1 g Intravenous Q24H  . hydrALAZINE  25 mg Oral BID  . lisinopril  10 mg Oral BID  . metoprolol  100 mg Oral Daily  . pregabalin  75 mg Oral BID  . propafenone  225 mg Oral Q8H  . warfarin  2 mg Oral ONCE-1800   Assessment:  77yo female being evaluated for SBO, on Coumadin for Afib. INR therapeutic this morning. CBC is stable, no bleeding noted per chart Goal of Therapy:  INR 2-3   Plan:  Coumadin 2.5mg  po x 1 (If discharge home, ok to continue home dose 2.5mg  daily)  Riki Rusk 09/16/2011,8:47 AM

## 2011-09-17 ENCOUNTER — Encounter: Payer: Self-pay | Admitting: *Deleted

## 2011-09-17 NOTE — Progress Notes (Signed)
Remote pacer check  

## 2011-09-21 ENCOUNTER — Encounter (HOSPITAL_COMMUNITY)
Admission: RE | Admit: 2011-09-21 | Discharge: 2011-09-21 | Disposition: A | Discharge status: Home / Self Care | Payer: Medicare Other | Source: Ambulatory Visit | Attending: Endocrinology | Admitting: Endocrinology

## 2011-09-21 DIAGNOSIS — E0590 Thyrotoxicosis, unspecified without thyrotoxic crisis or storm: Secondary | ICD-10-CM | POA: Insufficient documentation

## 2011-09-22 ENCOUNTER — Encounter (HOSPITAL_COMMUNITY)
Admission: RE | Admit: 2011-09-22 | Discharge: 2011-09-22 | Disposition: A | Discharge status: Home / Self Care | Payer: Medicare Other | Source: Ambulatory Visit | Attending: Endocrinology | Admitting: Endocrinology

## 2011-09-22 MED ORDER — SODIUM IODIDE I 131 CAPSULE: 9.2000 | Freq: Once | INTRAVENOUS | Status: AC | PRN

## 2011-09-22 MED ORDER — SODIUM PERTECHNETATE TC 99M INJECTION
11.0000 | Freq: Once | INTRAVENOUS | Status: AC | PRN
  Administered 2011-09-22: 11 via INTRAVENOUS

## 2011-09-28 ENCOUNTER — Telehealth: Payer: Self-pay | Admitting: Cardiology

## 2011-09-30 ENCOUNTER — Ambulatory Visit (INDEPENDENT_AMBULATORY_CARE_PROVIDER_SITE_OTHER): Payer: Medicare Other | Admitting: *Deleted

## 2011-09-30 DIAGNOSIS — I4891 Unspecified atrial fibrillation: Secondary | ICD-10-CM

## 2011-10-14 ENCOUNTER — Ambulatory Visit (INDEPENDENT_AMBULATORY_CARE_PROVIDER_SITE_OTHER): Payer: Medicare Other | Admitting: Pharmacist

## 2011-10-14 DIAGNOSIS — I4891 Unspecified atrial fibrillation: Secondary | ICD-10-CM

## 2011-11-11 ENCOUNTER — Ambulatory Visit (INDEPENDENT_AMBULATORY_CARE_PROVIDER_SITE_OTHER): Payer: Medicare Other | Admitting: *Deleted

## 2011-11-11 DIAGNOSIS — I4891 Unspecified atrial fibrillation: Secondary | ICD-10-CM

## 2011-11-25 ENCOUNTER — Ambulatory Visit (INDEPENDENT_AMBULATORY_CARE_PROVIDER_SITE_OTHER): Payer: Medicare Other | Admitting: Gynecology

## 2011-11-25 ENCOUNTER — Encounter: Payer: Self-pay | Admitting: Gynecology

## 2011-11-25 ENCOUNTER — Other Ambulatory Visit (HOSPITAL_COMMUNITY)
Admission: RE | Admit: 2011-11-25 | Discharge: 2011-11-25 | Disposition: A | Discharge status: Home / Self Care | Payer: Medicare Other | Source: Ambulatory Visit | Attending: Gynecology | Admitting: Gynecology

## 2011-11-25 VITALS — BP 138/88 | Ht 66.0 in | Wt 203.0 lb

## 2011-11-25 DIAGNOSIS — Z124 Encounter for screening for malignant neoplasm of cervix: Secondary | ICD-10-CM

## 2011-11-25 DIAGNOSIS — N952 Postmenopausal atrophic vaginitis: Secondary | ICD-10-CM

## 2011-11-25 DIAGNOSIS — C549 Malignant neoplasm of corpus uteri, unspecified: Secondary | ICD-10-CM

## 2011-11-25 DIAGNOSIS — C541 Malignant neoplasm of endometrium: Secondary | ICD-10-CM

## 2011-11-25 NOTE — Progress Notes (Signed)
TYESHIA CORNFORTH Apr 18, 1935 [id number]        77 y.o.  G2 P2 new patient with complex history as noted below.   Past medical history,surgical history, medications, allergies, family history and social history were all reviewed and documented in the EPIC chart. ROS:  Was performed and pertinent positives and negatives are included in the history.  Exam: Elane Fritz chaperone present Filed Vitals:   11/25/11 1407  BP: 138/88   General appearance  Normal Skin grossly normal Head/Neck normal with no cervical or supraclavicular adenopathy thyroid normal Lungs  clear Cardiac RR, without RMG Abdominal  soft, nontender, without masses, organomegaly or hernia Breasts  Status post bilateral mastectomy with well healed scars. No palpable masses, skin changes or retractions or axillary adenopathy. Palpable pacemaker in right upper anterior chest wall Pelvic  Ext/BUS/vagina  normal with atrophic changes Pap of cuff done  Adnexa  Without masses or tenderness    Anus and perineum  normal   Rectovaginal  normal sphincter tone without palpated masses or tenderness.    Assessment/Plan:  77 y.o. female history of stage III C endometrial carcinoma in 2006 status post radiation therapy and TAH/BSO. She'd been followed by Dr. Laurette Schimke and was released for routine annual follow up. She currently has no GYN complaints.  Exam today is consistent with her age. No abnormalities visually or palpable. Pap of the cuff was done. Being followed for a number of issues and is actively being seen for these.  No blood work was done today as is all done through her other physician's offices. She's up-to-date with colonoscopy and bone density. She does not receive mammograms due to her total mastectomies bilaterally. She will see me in a year assuming she continues well, sooner as needed.    Dara Lords MD, 2:37 PM 11/25/2011

## 2011-11-25 NOTE — Patient Instructions (Signed)
Follow up in one year for your annual gynecologic follow up.

## 2011-11-27 ENCOUNTER — Encounter: Payer: Self-pay | Admitting: Internal Medicine

## 2011-11-27 ENCOUNTER — Ambulatory Visit (INDEPENDENT_AMBULATORY_CARE_PROVIDER_SITE_OTHER): Payer: Medicare Other | Admitting: Internal Medicine

## 2011-11-27 ENCOUNTER — Ambulatory Visit (INDEPENDENT_AMBULATORY_CARE_PROVIDER_SITE_OTHER): Payer: Medicare Other | Admitting: *Deleted

## 2011-11-27 VITALS — BP 145/75 | HR 78 | Ht 66.0 in | Wt 203.0 lb

## 2011-11-27 DIAGNOSIS — I4891 Unspecified atrial fibrillation: Secondary | ICD-10-CM

## 2011-11-27 DIAGNOSIS — I951 Orthostatic hypotension: Secondary | ICD-10-CM

## 2011-11-27 DIAGNOSIS — R0989 Other specified symptoms and signs involving the circulatory and respiratory systems: Secondary | ICD-10-CM

## 2011-11-27 DIAGNOSIS — R42 Dizziness and giddiness: Secondary | ICD-10-CM

## 2011-11-27 DIAGNOSIS — Z950 Presence of cardiac pacemaker: Secondary | ICD-10-CM

## 2011-11-27 LAB — PACEMAKER DEVICE OBSERVATION
AL THRESHOLD: 1 V
BAMS-0001: 170 {beats}/min
RV LEAD AMPLITUDE: 3.7523 mv
RV LEAD THRESHOLD: 1 V

## 2011-11-27 LAB — POCT INR: INR: 3.9

## 2011-11-27 NOTE — Assessment & Plan Note (Signed)
She has a history of orthostatic lightheadedness; I cannot document blood pressure today. We have reviewed behavioral maneuvers that might help mitigate some of this.

## 2011-11-27 NOTE — Patient Instructions (Addendum)
Remote monitoring is used to monitor your Pacemaker of ICD from home. This monitoring reduces the number of office visits required to check your device to one time per year. It allows [place] to keep an eye on the functioning of your device to ensure it is working properly. You are scheduled for a device check from home on March 03, 2012. You may send your transmission at any time that day. If you have a wireless device, the transmission will be sent automatically. After your physician reviews your transmission, you will receive a postcard with your next transmission date.  Your physician wants you to follow-up in: 1 year with Dr. Graciela Husbands. You will receive a reminder letter in the mail two months in advance. If you don't receive a letter, please call our office to schedule the follow-up appointment.  Your physician recommends that you continue on your current medications as directed. Please refer to the Current Medication list given to you today.

## 2011-11-27 NOTE — Assessment & Plan Note (Signed)
The patient's device was interrogated.  The information was reviewed. No changes were made in the programming.    

## 2011-11-27 NOTE — Progress Notes (Signed)
HPI: Yolanda Spears is to followup for pacemaker implanted in 2007 for tachybradycardia syndrome.  She has paroxysmal atrial fibrillation managed with Rythmol as well as rate control with digoxin And metoprolol. She takes warfarin she's had no significant cardiovascular complaints. She has been admitted with a small bowel obstruction. There is concerns at that time about the management of her Coumadin.    Apparently there have been discussions in the past regarding alternatives to Coumadin. The patient denies SOB, chest pain edema or palpitations.  There has been no syncope; she does have symptoms of lightheadedness associated with standing. It does not occur with changes of her head position apart from one from lying-sitting or sitting-standing   Current Outpatient Prescriptions  Medication Sig Dispense Refill  . calcium carbonate (TUMS - DOSED IN MG ELEMENTAL CALCIUM) 500 MG chewable tablet Chew 1 tablet by mouth daily.      . Cholecalciferol (VITAMIN D3) 5000 UNITS CAPS Take 1 capsule by mouth daily. Every other day      . ezetimibe (ZETIA) 10 MG tablet Take 0.5 tablets (5 mg total) by mouth daily.  30 tablet  11  . furosemide (LASIX) 20 MG tablet Take 20 mg by mouth daily. 1 TAB QD       . hydrALAZINE (APRESOLINE) 25 MG tablet Take 1 tablet (25 mg total) by mouth 2 (two) times daily.  30 tablet  12  . lisinopril (PRINIVIL,ZESTRIL) 10 MG tablet Take 1 tablet (10 mg total) by mouth 2 (two) times daily.  60 tablet  11  . metoprolol (TOPROL-XL) 100 MG 24 hr tablet TAKE 1 TABLET BY MOUTH EVERY DAY  90 tablet  3  . pregabalin (LYRICA) 75 MG capsule Take 75 mg by mouth 2 (two) times daily.        . propafenone (RYTHMOL) 225 MG tablet Take 225 mg by mouth every 8 (eight) hours.        . Tetrahydrozoline HCl (EYE DROPS OP) Apply to eye as needed.        . warfarin (COUMADIN) 2.5 MG tablet Take 1 tablet (2.5 mg total) by mouth as directed.  32 tablet  11    Allergies  Allergen Reactions  .  Clindamycin/Lincomycin     heartburn  . Other     Contrast dye   . Penicillins   . Shellfish-Derived Products   . Statins     Myalgias,elevated LFT studies  . Iohexol Hives and Itching    Itching in eyes, one hive on face.    Past Medical History  Diagnosis Date  . Pacemaker   . Hypertension   . Peripheral neuropathy   . High cholesterol   . Heart murmur   . Shortness of breath on exertion   . Stroke     "small stroke & several TIA's"  . Atrial fibrillation   . History of radiation therapy   . Partial bowel obstruction JAN/2013  . Colon polyp 2011  . Endometrial cancer 2006    Stage IIIc  s/p radiation  . Breast cancer 1999    LOBULAR CARCINOMA IN SITU...    Past Surgical History  Procedure Date  . Lymph node resection 2007  . Cholecystectomy 1990's  . Insert / replace / remove pacemaker 2007    initial placement  . Mastectomy 1999    bilaterally w/lymph node dissection  . Abdominal hysterectomy 2006    BSO    Family History  Problem Relation Age of Onset  . Cancer Mother 69  UTERINE  . Breast cancer Mother   . Hypertension Mother   . Cancer Father     PROSTATE  . Hypertension Father     History   Social History  . Marital Status: Widowed    Spouse Name: N/A    Number of Children: 2  . Years of Education: N/A   Occupational History  . Retired    Social History Main Topics  . Smoking status: Former Smoker -- 1.0 packs/day for 40 years    Types: Cigarettes    Quit date: 11/26/1998  . Smokeless tobacco: Never Used  . Alcohol Use: Yes     09/14/11 "last drink ~ 08/31/2009"  . Drug Use: No  . Sexually Active: No   Other Topics Concern  . Not on file   Social History Narrative   Widowed mother of 2    Fourteen point review of systems was negative except as noted in HPI and PMH Except for tinnitus, incipient cataracts, recent easy satiety.  PHYSICAL EXAMINATION  Blood pressure 145/75, pulse 78, height 5\' 6"  (1.676 m), weight 203 lb  (92.08 kg).   Well developed and nourished Older Caucasian female appearing her stated age in no acute distress Well developed and nourished in no acute distress HENT normal Neck supple with JVP-flat Clear Regular rate with diminished s1 and rhythm, no murmurs or gallops Abd-soft with active BS No Clubbing cyanosis edema Skin-warm and dry A & Oriented  Grossly normal sensory and motor function

## 2011-11-27 NOTE — Assessment & Plan Note (Signed)
She has paroxysmal atrial fibrillation comprising about 4% of her total time. There seems to have been any recent up tick in the  frequency of episodes. Blood work from the hospital showed mild anemia; no TSH was drawn.  We have discussed the possibility of alternatives to Coumadin. It is quite inconvenient for her to get it done. I've given her the names of the different drugs and she will review them with her pharmacist to see what the relative costs are and she discussed this with Dr. Patty Sermons when she sees him next month. We will check a TSH today because of the increased frequency of atrial fibrillation

## 2011-12-10 ENCOUNTER — Encounter: Payer: Self-pay | Admitting: Cardiology

## 2011-12-10 ENCOUNTER — Ambulatory Visit (INDEPENDENT_AMBULATORY_CARE_PROVIDER_SITE_OTHER): Payer: Medicare Other

## 2011-12-10 ENCOUNTER — Ambulatory Visit (INDEPENDENT_AMBULATORY_CARE_PROVIDER_SITE_OTHER): Payer: Medicare Other | Admitting: Cardiology

## 2011-12-10 VITALS — BP 138/88 | HR 56 | Ht 67.0 in | Wt 205.0 lb

## 2011-12-10 DIAGNOSIS — I4891 Unspecified atrial fibrillation: Secondary | ICD-10-CM

## 2011-12-10 DIAGNOSIS — E7800 Pure hypercholesterolemia, unspecified: Secondary | ICD-10-CM

## 2011-12-10 DIAGNOSIS — Z8719 Personal history of other diseases of the digestive system: Secondary | ICD-10-CM

## 2011-12-10 DIAGNOSIS — I119 Hypertensive heart disease without heart failure: Secondary | ICD-10-CM

## 2011-12-10 DIAGNOSIS — I480 Paroxysmal atrial fibrillation: Secondary | ICD-10-CM

## 2011-12-10 LAB — POCT INR: INR: 3.4

## 2011-12-10 NOTE — Assessment & Plan Note (Signed)
Blood pressure has generally been satisfactory on current therapy.  She does have some element of whitecoat hypertension.

## 2011-12-10 NOTE — Assessment & Plan Note (Signed)
The patient has a history of hypercholesterolemia.  She is statin intolerance.  She is able to take ezetimibe

## 2011-12-10 NOTE — Assessment & Plan Note (Signed)
The patient has a history of paroxysmal atrial fibrillation.  Recent pacemaker interrogation indicated that she was in atrial fibrillation 4% of the time.  We talked to her today about considering alternatives to Coumadin.  She would be interested in switching to Xarelto if the insurance costs would not be too prohibitive.  She will check into it and let us know if she wants [place] to switch her from warfarin to Xarelto.

## 2011-12-10 NOTE — Patient Instructions (Signed)
Your physician wants you to follow-up in: 4 months with Dr. Patty Sermons.  You will receive a reminder letter in the mail two months in advance. If you don't receive a letter, please call our office to schedule the follow-up appointment.

## 2011-12-10 NOTE — Progress Notes (Signed)
Yolanda Spears Date of Birth:  [birth year] [facility] [address] [place], [place]  [address] [phone]         Fax   [phone]  History of Present Illness: This pleasant 77 year old woman is seen for a scheduled four-month followup office visit.  She has a past history of paroxysmal atrial fibrillation.  She has a functioning dual-chamber pacemaker since 2007.  As a history of essential hypertension and history of idiopathic peripheral neuropathy.  7 history of essential hypertension and she was hospitalized briefly in July 2012 for a TIA which presented with expressive aphasia and her prothrombin time at the time of admission was low at 1.4.  Since then her prothrombin times have been running normal or more recently slightly high.  She was recently hospitalized with abdominal pain secondary to partial small bowel obstruction.  She did not require surgery.  She has had multiple previous surgical procedures including cholecystectomy, hysterectomy, removal of ovarian cyst, and double mastectomies  Current Outpatient Prescriptions  Medication Sig Dispense Refill  . calcium carbonate (TUMS - DOSED IN MG ELEMENTAL CALCIUM) 500 MG chewable tablet Chew 1 tablet by mouth daily.      . Cholecalciferol (VITAMIN D3) 5000 UNITS CAPS Take 1 capsule by mouth daily. Every other day      . ezetimibe (ZETIA) 10 MG tablet Take 0.5 tablets (5 mg total) by mouth daily.  30 tablet  11  . furosemide (LASIX) 20 MG tablet Take 20 mg by mouth daily. 1 TAB QD       . hydrALAZINE (APRESOLINE) 25 MG tablet Take 1 tablet (25 mg total) by mouth 2 (two) times daily.  30 tablet  12  . lisinopril (PRINIVIL,ZESTRIL) 10 MG tablet Take 1 tablet (10 mg total) by mouth 2 (two) times daily.  60 tablet  11  . metoprolol (TOPROL-XL) 100 MG 24 hr tablet TAKE 1 TABLET BY MOUTH EVERY DAY  90 tablet  3  . pregabalin (LYRICA) 75 MG capsule Take 75 mg by mouth 2 (two) times daily.        . propafenone (RYTHMOL)  225 MG tablet Take 225 mg by mouth every 8 (eight) hours.        . Tetrahydrozoline HCl (EYE DROPS OP) Apply to eye as needed. As needed      . warfarin (COUMADIN) 2.5 MG tablet Take 1 tablet (2.5 mg total) by mouth as directed.  32 tablet  11    Allergies  Allergen Reactions  . Clindamycin/Lincomycin     heartburn  . Other     Contrast dye   . Penicillins   . Shellfish-Derived Products   . Statins     Myalgias,elevated LFT studies  . Iohexol Hives and Itching    Itching in eyes, one hive on face.    Patient Active Problem List  Diagnoses  . Atrial fibrillation  . First degree AV block  . Pacemaker- Dual-chamber-mdt  . Right bundle branch block  . Benign hypertensive heart disease without heart failure  . TIA (transient ischemic attack)  . Vertigo  . Hypercholesterolemia  . Partial small bowel obstruction  . Orthostatic lightheadedness    History  Smoking status  . Former Smoker -- 1.0 packs/day for 40 years  . Types: Cigarettes  . Quit date: 11/26/1998  Smokeless tobacco  . Never Used    History  Alcohol Use  . Yes    09/14/11 "last drink ~ 08/31/2009"    Family History  Problem  Relation Age of Onset  . Cancer Mother 27    UTERINE  . Breast cancer Mother   . Hypertension Mother   . Cancer Father     PROSTATE  . Hypertension Father     Review of Systems: Constitutional: no fever chills diaphoresis or fatigue or change in weight.  Head and neck: no hearing loss, no epistaxis, no photophobia or visual disturbance. Respiratory: No cough, shortness of breath or wheezing. Cardiovascular: No chest pain peripheral edema, palpitations. Gastrointestinal: No abdominal distention, no abdominal pain, no change in bowel habits hematochezia or melena. Genitourinary: No dysuria, no frequency, no urgency, no nocturia. Musculoskeletal:No arthralgias, no back pain, no gait disturbance or myalgias. Neurological: No dizziness, no headaches, no numbness, no seizures, no  syncope, no weakness, no tremors. Hematologic: No lymphadenopathy, no easy bruising. Psychiatric: No confusion, no hallucinations, no sleep disturbance.    Physical Exam: Filed Vitals:   12/10/11 1510  BP: 138/88  Pulse: 56   the general appearance reveals a well-developed well-nourished woman in no distress. Pupils equal and reactive.   Extraocular Movements are full.  There is no scleral icterus.  The mouth and pharynx are normal.  The neck is supple.  The carotids reveal no bruits.  The jugular venous pressure is normal.  The thyroid is not enlarged.  There is no lymphadenopathy.  The chest is clear to percussion and auscultation. There are no rales or rhonchi. Expansion of the chest is symmetrical.  The precordium is quiet.  The first heart sound is normal.  The second heart sound is physiologically split.  There is no murmur gallop rub or click.  There is no abnormal lift or heave.  The abdomen is soft and nontender. Bowel sounds are normal. The liver and spleen are not enlarged. There Are no abdominal masses. There are no bruits.  Extremities reveal thick ankles but no edema.   Assessment / Plan: The patient is to continue same medication.  We'll explore the possible switch to Xarelto costs is not prohibitive.  Recheck in 4 months for followup office visit.

## 2011-12-17 NOTE — Progress Notes (Signed)
Addended by: Reine Just on: 12/17/2011 12:09 PM   Modules accepted: Orders

## 2011-12-24 ENCOUNTER — Encounter: Payer: Self-pay | Admitting: Internal Medicine

## 2011-12-24 ENCOUNTER — Ambulatory Visit (INDEPENDENT_AMBULATORY_CARE_PROVIDER_SITE_OTHER): Payer: Medicare Other | Admitting: *Deleted

## 2011-12-24 DIAGNOSIS — I4891 Unspecified atrial fibrillation: Secondary | ICD-10-CM

## 2011-12-31 ENCOUNTER — Other Ambulatory Visit: Payer: Self-pay | Admitting: Cardiology

## 2011-12-31 NOTE — Telephone Encounter (Signed)
Refilled furosemide

## 2012-01-08 ENCOUNTER — Other Ambulatory Visit: Payer: Self-pay | Admitting: Cardiology

## 2012-01-08 NOTE — Telephone Encounter (Signed)
Refilled metoprolol 

## 2012-01-14 ENCOUNTER — Ambulatory Visit (INDEPENDENT_AMBULATORY_CARE_PROVIDER_SITE_OTHER): Payer: Medicare Other | Admitting: *Deleted

## 2012-01-14 DIAGNOSIS — I4891 Unspecified atrial fibrillation: Secondary | ICD-10-CM

## 2012-01-24 ENCOUNTER — Other Ambulatory Visit: Payer: Self-pay | Admitting: Cardiology

## 2012-01-25 NOTE — Telephone Encounter (Signed)
Refilled rythmol

## 2012-02-02 ENCOUNTER — Encounter: Payer: Self-pay | Admitting: Internal Medicine

## 2012-02-09 ENCOUNTER — Other Ambulatory Visit: Payer: Self-pay | Admitting: Cardiology

## 2012-02-11 ENCOUNTER — Ambulatory Visit (INDEPENDENT_AMBULATORY_CARE_PROVIDER_SITE_OTHER): Payer: Medicare Other | Admitting: *Deleted

## 2012-02-11 DIAGNOSIS — I4891 Unspecified atrial fibrillation: Secondary | ICD-10-CM

## 2012-02-11 LAB — POCT INR: INR: 3.8

## 2012-02-26 ENCOUNTER — Encounter: Payer: Self-pay | Admitting: Internal Medicine

## 2012-02-29 ENCOUNTER — Encounter: Payer: Self-pay | Admitting: Internal Medicine

## 2012-02-29 ENCOUNTER — Ambulatory Visit (INDEPENDENT_AMBULATORY_CARE_PROVIDER_SITE_OTHER): Payer: Medicare Other | Admitting: Internal Medicine

## 2012-02-29 ENCOUNTER — Other Ambulatory Visit (INDEPENDENT_AMBULATORY_CARE_PROVIDER_SITE_OTHER): Payer: Medicare Other

## 2012-02-29 VITALS — BP 148/64 | HR 64 | Ht 67.0 in | Wt 202.0 lb

## 2012-02-29 DIAGNOSIS — R109 Unspecified abdominal pain: Secondary | ICD-10-CM

## 2012-02-29 DIAGNOSIS — R1110 Vomiting, unspecified: Secondary | ICD-10-CM

## 2012-02-29 DIAGNOSIS — Z86010 Personal history of colonic polyps: Secondary | ICD-10-CM

## 2012-02-29 DIAGNOSIS — R1030 Lower abdominal pain, unspecified: Secondary | ICD-10-CM

## 2012-02-29 DIAGNOSIS — R197 Diarrhea, unspecified: Secondary | ICD-10-CM

## 2012-02-29 DIAGNOSIS — Z9889 Other specified postprocedural states: Secondary | ICD-10-CM

## 2012-02-29 DIAGNOSIS — Z8719 Personal history of other diseases of the digestive system: Secondary | ICD-10-CM

## 2012-02-29 LAB — COMPREHENSIVE METABOLIC PANEL
Albumin: 3.6 g/dL (ref 3.5–5.2)
BUN: 19 mg/dL (ref 6–23)
CO2: 31 mEq/L (ref 19–32)
Calcium: 9.5 mg/dL (ref 8.4–10.5)
GFR: 40.63 mL/min — ABNORMAL LOW (ref >60.00)
Glucose, Bld: 104 mg/dL — ABNORMAL HIGH (ref 70–99)
Potassium: 5 mEq/L (ref 3.5–5.1)
Sodium: 146 mEq/L — ABNORMAL HIGH (ref 135–145)
Total Protein: 7.6 g/dL (ref 6.0–8.3)

## 2012-02-29 LAB — CBC WITH DIFFERENTIAL/PLATELET
Basophils Relative: 0.3 % (ref 0.0–3.0)
Eosinophils Relative: 1.4 % (ref 0.0–5.0)
HCT: 42.4 % (ref 36.0–46.0)
MCV: 90.3 fl (ref 78.0–100.0)
Monocytes Relative: 10 % (ref 3.0–12.0)
Neutrophils Relative %: 72.7 % (ref 43.0–77.0)
Platelets: 185 10*3/uL (ref 150.0–400.0)
RBC: 4.7 Mil/uL (ref 3.87–5.11)
WBC: 6.9 10*3/uL (ref 4.5–10.5)

## 2012-02-29 MED ORDER — ALIGN PO CAPS: 1.0000 | ORAL_CAPSULE | Freq: Every day | ORAL | Status: DC

## 2012-02-29 MED ORDER — HYOSCYAMINE SULFATE 0.125 MG SL SUBL: 0.1250 mg | SUBLINGUAL_TABLET | SUBLINGUAL | Status: DC | PRN

## 2012-02-29 NOTE — Patient Instructions (Addendum)
You have been scheduled for an Upper GI Series at [facility] on 03/03/2012 @ 11:00am please arrive at 10:45am at Radiology.  Nothing to eat or drink after midnight the day of your appointment. If you cannot make the appointment or need to reschedule please call [phone]  We have sent the following medications to your pharmacy for you to pick up at your convenience: You were given samples of Align.  Levsin   Your physician has requested that you go to the basement for the following lab work before leaving today: CBC. CMP. Celiac Panel

## 2012-02-29 NOTE — Progress Notes (Signed)
Patient ID: Yolanda Spears, female   DOB: 21-Nov-1934, 77 y.o.   MRN: [id number]  SUBJECTIVE: HPI Yolanda Spears is a 77 yo female with PMH of this cancer in situ status post double mastectomy, endometrial cancer status post hysterectomy with radiation in 2006, atrial fibrillation on warfarin, colon polyps with last colonoscopy in 2011, TIAs, HTN, partial SBO in Jan 2013 treated conservatively who is seen for evaluation of intermittent abd pain, vomiting and diarrhea.  The patient reports intermittent lower abdominal pain, vomiting, and diarrhea which started some years ago, perhaps 2 or 3. These episodes start with lower abdominal pain, which can be severe. This is been followed by vomiting. Her vomiting is usually without nausea. She also has loose stools during these attacks. Initially this tended to occur every 4-6 months and she was told she had gastroenteritis. They seem to be happening more frequently now, and required hospitalization in January 2013. During his hospitalization CT scan demonstrated partial small bowel obstruction with transition point in the terminal ileum felt to be secondary to adhesive disease. Her last attack, not requiring hospitalization was over [facility] Day 2013. She reports she felt like she was to have a "mini attack" this past weekend but it did not evolve into a severe attack. She has learned to reduce her diet and begin a clear liquid diet only when her symptoms start. This tends to help. Somewhat separate from these attacks she reports that she has had alternating bowel habits does seem to occur in a cycle. The cycle tends to be about one month long. This consists of normal stools alternating with hard stools alternating with loose stools. This tends to run separate from the discrete attacks as described above. At times she feels that her loose stools occur postprandially and are associated with feeling weak, looking pale, and a mild tremor. She's had no syncope. She denies  fever, chills, night sweats. She denies flushing. She's had no rectal bleeding or melena. He does note increased lower gas but no bloating. She's had no weight loss. She denies trouble with heartburn. No dysphagia or odynophagia. No new medications other than Lyrica, but it is not seem that Lyrica has caused a change in her bowel symptoms.  She reports having undergone a colonoscopy less than 2 years ago with Dr. Loreta Ave. She was told she has diverticulosis and had polyps. She was recommended a repeat exam in 5 years.  Also her history is notable for the removal of a "abdominal cyst" which was benign and removed in [facility] in 2007  Review of Systems  As per history of present illness, otherwise negative, except for fatigue and occasional urinary leakage  Past Medical History  Diagnosis Date  . Pacemaker   . Hypertension   . Peripheral neuropathy   . High cholesterol   . Heart murmur   . Shortness of breath on exertion   . Stroke     "small stroke & several TIA's"  . Atrial fibrillation   . History of radiation therapy   . Partial bowel obstruction JAN/2013  . Colon polyp 2011  . Endometrial cancer 2006    Stage IIIc  s/p radiation  . Breast cancer 1999    LOBULAR CARCINOMA IN SITU...    Current Outpatient Prescriptions  Medication Sig Dispense Refill  . calcium carbonate (TUMS - DOSED IN MG ELEMENTAL CALCIUM) 500 MG chewable tablet Chew 1 tablet by mouth daily.      . Cholecalciferol (VITAMIN D3) 5000 UNITS CAPS Take  1 capsule by mouth daily. Every other day      . ezetimibe (ZETIA) 10 MG tablet Take 0.5 tablets (5 mg total) by mouth daily.  30 tablet  11  . furosemide (LASIX) 20 MG tablet TAKE 1 TABLET EVERY DAY  30 tablet  11  . hydrALAZINE (APRESOLINE) 25 MG tablet TAKE 1 TABLET BY MOUTH TWICE A DAY  60 tablet  12  . lisinopril (PRINIVIL,ZESTRIL) 10 MG tablet Take 1 tablet (10 mg total) by mouth 2 (two) times daily.  60 tablet  11  . metoprolol succinate (TOPROL-XL)  100 MG 24 hr tablet TAKE 1 TABLET BY MOUTH EVERY DAY  90 tablet  3  . pregabalin (LYRICA) 75 MG capsule Take 75 mg by mouth 2 (two) times daily.        . propafenone (RYTHMOL) 225 MG tablet TAKE 1 TABLET EVERY 8 HOURS  100 tablet  10  . Tetrahydrozoline HCl (EYE DROPS OP) Apply to eye as needed. As needed      . warfarin (COUMADIN) 2.5 MG tablet Take 1 tablet (2.5 mg total) by mouth as directed.  32 tablet  11  . bifidobacterium infantis (ALIGN) capsule Take 1 capsule by mouth daily.  14 capsule  0  . hyoscyamine (LEVSIN/SL) 0.125 MG SL tablet Place 1 tablet (0.125 mg total) under the tongue every 4 (four) hours as needed for cramping.  30 tablet  0    Allergies  Allergen Reactions  . Clindamycin/Lincomycin     heartburn  . Other     Contrast dye   . Penicillins   . Shellfish-Derived Products   . Statins     Myalgias,elevated LFT studies  . Iohexol Hives and Itching    Itching in eyes, one hive on face.    Family History  Problem Relation Age of Onset  . Cancer Mother 19    UTERINE  . Breast cancer Mother   . Hypertension Mother   . Uterine cancer Mother   . Cancer Father     PROSTATE  . Hypertension Father     History  Substance Use Topics  . Smoking status: Former Smoker -- 1.0 packs/day for 40 years    Types: Cigarettes    Quit date: 11/26/1998  . Smokeless tobacco: Never Used  . Alcohol Use: Yes     09/14/11 "last drink ~ 08/31/2009"    OBJECTIVE: BP 148/64  Pulse 64  Ht 5\' 7"  (1.702 m)  Wt 202 lb (91.627 kg)  BMI 31.64 kg/m2 Constitutional: Well-developed and well-nourished. No distress. HEENT: Normocephalic and atraumatic. Oropharynx is clear and moist. No oropharyngeal exudate. Conjunctivae are normal. No scleral icterus. Neck: Neck supple. Trachea midline. Cardiovascular: Normal rate, regular rhythm and intact distal pulses. No M/R/G Pulmonary/chest: Effort normal and breath sounds normal. No wheezing, rales or rhonchi. Abdominal: Soft, nontender,  nondistended. Midline abdominal scar, well healed Bowel sounds active throughout. There are no masses palpable. No hepatosplenomegaly. Extremities: no clubbing, cyanosis, or edema Lymphadenopathy: No cervical adenopathy noted. Neurological: Alert and oriented to person place and time. Skin: Skin is warm and dry. No rashes noted. Psychiatric: Normal mood and affect. Behavior is normal.  Labs and Imaging -- Old labs reviewed as below: CBC    Component Value Date/Time   WBC 4.4 09/16/2011 0540   RBC 3.62* 09/16/2011 0540   HGB 10.9* 09/16/2011 0540   HCT 33.0* 09/16/2011 0540   PLT 132* 09/16/2011 0540   MCV 91.2 09/16/2011 0540   MCH 30.1 09/16/2011 0540  MCHC 33.0 09/16/2011 0540   RDW 14.0 09/16/2011 0540   LYMPHSABS 0.7 09/13/2011 2306   MONOABS 0.6 09/13/2011 2306   EOSABS 0.0 09/13/2011 2306   BASOSABS 0.0 09/13/2011 2306    CMP     Component Value Date/Time   NA 136 09/14/2011 0425   K 4.3 09/14/2011 0425   CL 101 09/14/2011 0425   CO2 25 09/14/2011 0425   GLUCOSE 116* 09/14/2011 0425   BUN 15 09/14/2011 0425   CREATININE 0.90 09/14/2011 0425   CALCIUM 8.9 09/14/2011 0425   PROT 7.1 09/13/2011 2346   ALBUMIN 3.2* 09/13/2011 2346   AST 17 09/13/2011 2346   ALT 16 09/13/2011 2346   ALKPHOS 69 09/13/2011 2346   BILITOT 0.9 09/13/2011 2346   GFRNONAA 61* 09/14/2011 0425   GFRAA 71* 09/14/2011 0425   Imaging Jan 2013 CT ABDOMEN AND PELVIS WITHOUT CONTRAST   Technique:  Multidetector CT imaging of the abdomen and pelvis was performed following the standard protocol without intravenous contrast.   Comparison: 09/14/2011 abdominal radiographs.   Findings: Partially visualized pacemaker leads in the heart. Nasogastric tube is present with the tip in the stomach. Unenhanced CT was performed per clinician order.  Lack of IV contrast limits sensitivity and specificity, especially for evaluation of abdominal/pelvic solid viscera.  Liver appears normal.  Old granulomatous disease of the spleen.  Aorto  iliofemoral atherosclerosis without aneurysm.  Nonspecific bilateral perinephric stranding.  No urolithiasis.  The ureters appear within normal limits.  Midline abdominal scar is present.  Colonic diverticulosis without diverticulitis.  Urinary bladder appears normal.  Partial small bowel obstruction is present with contrast extending to the colon.  Normal appendix is present.  Mildly dilated small bowel extends into the anatomic pelvis.  There is a transition point in the terminal ileum (image 61 series 2).  The findings are compatible with adhesive small bowel obstruction.   Pancreas appears within normal limits.  Cholecystectomy.  No aggressive osseous lesions are present.   IMPRESSION: 1.  Adhesive partial small bowel obstruction.  This appears low grade, with oral contrast reaching the colon. 2.  Atherosclerosis. 3.  Old granulomatous disease. 4.  Nasogastric tube with the tip in the stomach.  ASSESSMENT AND PLAN:  77 yo female with PMH of this cancer in situ status post double mastectomy, endometrial cancer status post hysterectomy with radiation in 2006, atrial fibrillation on warfarin, colon polyps with last colonoscopy in 2011, TIAs, HTN, partial SBO in Jan 2013 treated conservatively who is seen for evaluation of intermittent abd pain, vomiting and diarrhea  1. Intermittent lower abd pain/vomiting/diarrhea -- the patient's constellation of symptoms seem most likely related to intermittent partial small bowel obstructions (most likely from adhesive disease, less likely inflammatory or other). We have evidence for partial small bowel obstruction in January 2013. She's had additional episodes without subsequent imaging. It seems that reducing her diet to liquids only helps these episodes resolve, and this fits also with transient or partial small bowel obstructions. She's had a recent colonoscopy, reportedly without significant pathology, which is reassuring. We are awaiting these  results to verify the study. I am repeating labs today to include CBC, CMP. I will also check a celiac panel. I would like to perform an upper GI series with small bowel follow-through to evaluate the small bowel and exclude stricture. If her symptoms become more frequent, and the distal small bowel is felt to be the culprit, she may benefit from resection or lysis of adhesion.  I've given  her a prescription for sublingual Levsin 0.25 mg to be used every 4-6 hours as needed for lower abdominal pain and spasm. I have also recommended a low residue diet.  2. Alternating bowel habits -- separate from the episodic lower abdominal pain/vomiting/diarrhea as mentioned in #1, she has had alternating diarrhea and constipation. This seems more irritable in nature, though other considerations include celiac disease, small bowel bacterial overgrowth. I will give her a trial of Align one capsule daily, and only the above studies before proceeding further.  I will see her back in one month's time, and I've asked that she contact should she develop an acute attack of the symptoms as described in #1.  She voices understanding.

## 2012-03-03 ENCOUNTER — Encounter: Payer: Self-pay | Admitting: Internal Medicine

## 2012-03-03 ENCOUNTER — Ambulatory Visit (INDEPENDENT_AMBULATORY_CARE_PROVIDER_SITE_OTHER): Payer: Medicare Other | Admitting: *Deleted

## 2012-03-03 ENCOUNTER — Other Ambulatory Visit (HOSPITAL_COMMUNITY): Payer: Medicare Other

## 2012-03-03 DIAGNOSIS — I440 Atrioventricular block, first degree: Secondary | ICD-10-CM

## 2012-03-03 DIAGNOSIS — I4891 Unspecified atrial fibrillation: Secondary | ICD-10-CM

## 2012-03-06 ENCOUNTER — Other Ambulatory Visit: Payer: Self-pay | Admitting: Cardiology

## 2012-03-07 ENCOUNTER — Telehealth: Payer: Self-pay | Admitting: Internal Medicine

## 2012-03-07 LAB — REMOTE PACEMAKER DEVICE
AL AMPLITUDE: 2.6733 mv
BATTERY VOLTAGE: 2.92 V
VENTRICULAR PACING PM: 79.52

## 2012-03-07 NOTE — Telephone Encounter (Signed)
Error

## 2012-03-08 ENCOUNTER — Ambulatory Visit: Payer: Medicare Other | Admitting: Internal Medicine

## 2012-03-09 ENCOUNTER — Encounter: Payer: Self-pay | Admitting: *Deleted

## 2012-03-09 ENCOUNTER — Ambulatory Visit (INDEPENDENT_AMBULATORY_CARE_PROVIDER_SITE_OTHER): Payer: Medicare Other | Admitting: Internal Medicine

## 2012-03-09 ENCOUNTER — Encounter: Payer: Self-pay | Admitting: Internal Medicine

## 2012-03-09 ENCOUNTER — Ambulatory Visit: Payer: Medicare Other | Admitting: Internal Medicine

## 2012-03-09 VITALS — BP 136/72 | HR 65 | Ht 67.0 in | Wt 203.0 lb

## 2012-03-09 DIAGNOSIS — Z950 Presence of cardiac pacemaker: Secondary | ICD-10-CM

## 2012-03-09 DIAGNOSIS — G459 Transient cerebral ischemic attack, unspecified: Secondary | ICD-10-CM

## 2012-03-09 DIAGNOSIS — I440 Atrioventricular block, first degree: Secondary | ICD-10-CM

## 2012-03-09 DIAGNOSIS — I4891 Unspecified atrial fibrillation: Secondary | ICD-10-CM

## 2012-03-09 LAB — CBC WITH DIFFERENTIAL/PLATELET
Basophils Relative: 0.3 % (ref 0.0–3.0)
Eosinophils Absolute: 0.1 10*3/uL (ref 0.0–0.7)
Eosinophils Relative: 1.3 % (ref 0.0–5.0)
HCT: 41.1 % (ref 36.0–46.0)
Lymphs Abs: 0.9 10*3/uL (ref 0.7–4.0)
MCHC: 32.8 g/dL (ref 30.0–36.0)
MCV: 90.2 fl (ref 78.0–100.0)
Monocytes Absolute: 0.6 10*3/uL (ref 0.1–1.0)
Neutrophils Relative %: 75.9 % (ref 43.0–77.0)
RBC: 4.56 Mil/uL (ref 3.87–5.11)
WBC: 6.9 10*3/uL (ref 4.5–10.5)

## 2012-03-09 LAB — BASIC METABOLIC PANEL
BUN: 17 mg/dL (ref 6–23)
CO2: 27 mEq/L (ref 19–32)
Chloride: 102 mEq/L (ref 96–112)
Creatinine, Ser: 1.1 mg/dL (ref 0.4–1.2)
Potassium: 4.2 mEq/L (ref 3.5–5.1)

## 2012-03-09 NOTE — Patient Instructions (Signed)
Your physician has recommended that you have a pacemaker generator change. Please see the instruction sheet given to you today for more information.    

## 2012-03-09 NOTE — Assessment & Plan Note (Signed)
Device reached [facility] April 2013.  We have reviewed the benefits and risks of generator replacement.  These include but are not limited to lead fracture and infection.  The patient understands, agrees and is willing to proceed.

## 2012-03-09 NOTE — Assessment & Plan Note (Signed)
Holding sinus rhythm as best as we can tell. She will continue her on her antiarrhythmic drug and warfarin. She has a history of TIAs we will not hold her warfarin at that time generator replacement

## 2012-03-09 NOTE — Assessment & Plan Note (Signed)
As above.

## 2012-03-09 NOTE — Progress Notes (Signed)
HPI  Yolanda Spears is a 77 y.o. female Seen in followup for pacemaker implanted in 2007 for tachybradycardia syndrome .  She has paroxysmal atrial fibrillation managed with Rythmol as well as rate control with digoxin And metoprolol. She takes warfarin   She has had problems with lightheadedness and exercise intolerance which has been worse over the last couple of months.  We saw her in March. She reverted in early April to VVI 65       Past Medical History  Diagnosis Date  . Pacemaker   . Hypertension   . Peripheral neuropathy   . High cholesterol   . Heart murmur   . Shortness of breath on exertion   . Stroke     "small stroke & several TIA's"  . Atrial fibrillation   . History of radiation therapy   . Partial bowel obstruction JAN/2013  . Colon polyp 2011  . Endometrial cancer 2006    Stage IIIc  s/p radiation  . Breast cancer 1999    LOBULAR CARCINOMA IN SITU...    Past Surgical History  Procedure Date  . Lymph node resection 2007  . Cholecystectomy 1990's  . Insert / replace / remove pacemaker 2007    initial placement  . Mastectomy 1999    bilaterally w/lymph node dissection  . Abdominal hysterectomy 2006    BSO    Current Outpatient Prescriptions  Medication Sig Dispense Refill  . bifidobacterium infantis (ALIGN) capsule Take 1 capsule by mouth daily.  14 capsule  0  . calcium carbonate (TUMS - DOSED IN MG ELEMENTAL CALCIUM) 500 MG chewable tablet Chew 1 tablet by mouth daily.      . Cholecalciferol (VITAMIN D3) 5000 UNITS CAPS Take 1 capsule by mouth daily. Every other day      . furosemide (LASIX) 20 MG tablet TAKE 1 TABLET EVERY DAY  30 tablet  11  . hydrALAZINE (APRESOLINE) 25 MG tablet TAKE 1 TABLET BY MOUTH TWICE A DAY  60 tablet  12  . lisinopril (PRINIVIL,ZESTRIL) 10 MG tablet Take 1 tablet (10 mg total) by mouth 2 (two) times daily.  60 tablet  11  . metoprolol succinate (TOPROL-XL) 100 MG 24 hr tablet TAKE 1 TABLET BY MOUTH EVERY DAY  90  tablet  3  . pregabalin (LYRICA) 75 MG capsule Take 75 mg by mouth 2 (two) times daily.        . propafenone (RYTHMOL) 225 MG tablet TAKE 1 TABLET EVERY 8 HOURS  100 tablet  10  . warfarin (COUMADIN) 2.5 MG tablet Take 1 tablet (2.5 mg total) by mouth as directed.  32 tablet  11  . ZETIA 10 MG tablet TAKE 1/2 TABLETS BY MOUTH EVERY DAY  30 tablet  5    Allergies  Allergen Reactions  . Clindamycin/Lincomycin     heartburn  . Other     Contrast dye   . Penicillins   . Shellfish-Derived Products   . Statins     Myalgias,elevated LFT studies  . Iohexol Hives and Itching    Itching in eyes, one hive on face.    Review of Systems negative except from HPI and PMH  Physical Exam BP 136/72  Ht 5' 7" (1.702 m)  Wt 203 lb (92.08 kg)  BMI 31.79 kg/m2 Well developed and well nourished in no acute distress HENT normal E scleral and icterus clear Neck Supple JVP flat; carotids brisk and full Clear to ausculation Regular rate and rhythm,  variable S1 Soft   with active bowel sounds No clubbing cyanosis Trace Edema Alert and oriented, grossly normal motor and sensory function Skin Warm and Dry ECG demonstrates ventricular pacing with A-V dissociation and complete heart block    Assessment and  Plan  

## 2012-03-09 NOTE — Assessment & Plan Note (Signed)
Seems to be in heart block

## 2012-03-11 ENCOUNTER — Encounter (HOSPITAL_COMMUNITY): Payer: Self-pay | Admitting: Respiratory Therapy

## 2012-03-11 ENCOUNTER — Ambulatory Visit: Payer: Self-pay | Admitting: Pharmacist

## 2012-03-11 DIAGNOSIS — I4891 Unspecified atrial fibrillation: Secondary | ICD-10-CM

## 2012-03-14 ENCOUNTER — Other Ambulatory Visit: Payer: Self-pay | Admitting: Internal Medicine

## 2012-03-14 DIAGNOSIS — Z950 Presence of cardiac pacemaker: Secondary | ICD-10-CM

## 2012-03-16 MED ORDER — VANCOMYCIN HCL IN DEXTROSE 1-5 GM/200ML-% IV SOLN
1000.0000 mg | INTRAVENOUS | Status: AC
  Filled 2012-03-16: qty 200

## 2012-03-16 MED ORDER — SODIUM CHLORIDE 0.9 % IR SOLN
80.0000 mg | Status: AC
  Filled 2012-03-16: qty 2

## 2012-03-23 ENCOUNTER — Ambulatory Visit (HOSPITAL_COMMUNITY): Payer: Medicare Other

## 2012-03-23 ENCOUNTER — Encounter (HOSPITAL_COMMUNITY)
Admission: RE | Disposition: A | Discharge status: Home / Self Care | Payer: Self-pay | Source: Ambulatory Visit | Attending: Internal Medicine

## 2012-03-23 ENCOUNTER — Ambulatory Visit (HOSPITAL_COMMUNITY)
Admission: RE | Admit: 2012-03-23 | Discharge: 2012-03-23 | Disposition: A | Discharge status: Home / Self Care | Payer: Medicare Other | Source: Ambulatory Visit | Attending: Internal Medicine | Admitting: Internal Medicine

## 2012-03-23 DIAGNOSIS — Z45018 Encounter for adjustment and management of other part of cardiac pacemaker: Secondary | ICD-10-CM | POA: Insufficient documentation

## 2012-03-23 DIAGNOSIS — G609 Hereditary and idiopathic neuropathy, unspecified: Secondary | ICD-10-CM | POA: Insufficient documentation

## 2012-03-23 DIAGNOSIS — I495 Sick sinus syndrome: Secondary | ICD-10-CM | POA: Insufficient documentation

## 2012-03-23 DIAGNOSIS — I10 Essential (primary) hypertension: Secondary | ICD-10-CM | POA: Insufficient documentation

## 2012-03-23 DIAGNOSIS — Z950 Presence of cardiac pacemaker: Secondary | ICD-10-CM

## 2012-03-23 DIAGNOSIS — E7800 Pure hypercholesterolemia, unspecified: Secondary | ICD-10-CM | POA: Insufficient documentation

## 2012-03-23 DIAGNOSIS — I4891 Unspecified atrial fibrillation: Secondary | ICD-10-CM | POA: Insufficient documentation

## 2012-03-23 HISTORY — PX: PERMANENT PACEMAKER GENERATOR CHANGE: SHX6022

## 2012-03-23 LAB — SURGICAL PCR SCREEN
MRSA, PCR: NEGATIVE
Staphylococcus aureus: NEGATIVE

## 2012-03-23 LAB — PROTIME-INR: Prothrombin Time: 27.1 seconds — ABNORMAL HIGH (ref 11.6–15.2)

## 2012-03-23 SURGERY — PERMANENT PACEMAKER GENERATOR CHANGE
Anesthesia: LOCAL

## 2012-03-23 MED ORDER — SODIUM CHLORIDE 0.9 % IV SOLN: 250.0000 mL | INTRAVENOUS | Status: AC

## 2012-03-23 MED ORDER — ONDANSETRON HCL 4 MG/2ML IJ SOLN: 4.0000 mg | Freq: Four times a day (QID) | INTRAMUSCULAR | Status: DC | PRN

## 2012-03-23 MED ORDER — SODIUM CHLORIDE 0.45 % IV SOLN
INTRAVENOUS | Status: DC
  Administered 2012-03-23: 14:00:00 via INTRAVENOUS

## 2012-03-23 MED ORDER — LIDOCAINE HCL (PF) 1 % IJ SOLN
INTRAMUSCULAR | Status: AC
  Filled 2012-03-23: qty 60

## 2012-03-23 MED ORDER — SODIUM CHLORIDE 0.9 % IJ SOLN: 3.0000 mL | Freq: Two times a day (BID) | INTRAMUSCULAR | Status: DC

## 2012-03-23 MED ORDER — VANCOMYCIN HCL IN DEXTROSE 1-5 GM/200ML-% IV SOLN
1000.0000 mg | INTRAVENOUS | Status: DC
  Filled 2012-03-23 (×2): qty 200

## 2012-03-23 MED ORDER — SODIUM CHLORIDE 0.9 % IR SOLN
80.0000 mg | Status: DC
  Filled 2012-03-23: qty 2

## 2012-03-23 MED ORDER — MUPIROCIN 2 % EX OINT
TOPICAL_OINTMENT | Freq: Two times a day (BID) | CUTANEOUS | Status: DC
  Filled 2012-03-23 (×2): qty 22

## 2012-03-23 MED ORDER — SODIUM CHLORIDE 0.9 % IJ SOLN: 3.0000 mL | INTRAMUSCULAR | Status: DC | PRN

## 2012-03-23 MED ORDER — SODIUM CHLORIDE 0.9 % IV SOLN: INTRAVENOUS | Status: DC

## 2012-03-23 MED ORDER — MIDAZOLAM HCL 5 MG/5ML IJ SOLN
INTRAMUSCULAR | Status: AC
  Filled 2012-03-23: qty 5

## 2012-03-23 MED ORDER — HEPARIN (PORCINE) IN NACL 2-0.9 UNIT/ML-% IJ SOLN
INTRAMUSCULAR | Status: AC
  Filled 2012-03-23: qty 1000

## 2012-03-23 MED ORDER — SODIUM CHLORIDE 0.9 % IV SOLN: 250.0000 mL | INTRAVENOUS | Status: DC

## 2012-03-23 MED ORDER — CHLORHEXIDINE GLUCONATE 4 % EX LIQD: 60.0000 mL | Freq: Once | CUTANEOUS | Status: DC

## 2012-03-23 MED ORDER — ACETAMINOPHEN 325 MG PO TABS: 325.0000 mg | ORAL_TABLET | ORAL | Status: DC | PRN

## 2012-03-23 MED ORDER — FENTANYL CITRATE 0.05 MG/ML IJ SOLN
INTRAMUSCULAR | Status: AC
  Filled 2012-03-23: qty 2

## 2012-03-23 NOTE — CV Procedure (Signed)
Preoperative diagnosis sinusnode dysfunction Postoperative diagnosis same/  Procedure: Generator replacement     Following informed consent the patient was brought to the electrophysiology laboratory in place of the fluoroscopic table in the supine position after routine prep and drape lidocaine was infiltrated in the region of the previous incision and carried down to later the device pocket using sharp dissection and electrocautery. The pocket was opened the device was freed up and was explanted.  Interrogation of the previously implanted ventricular lead Medtronic demonstrated an R wave of 6.7llivolts., and impedance of 555s, and a pacing threshold of 0.6lts at 0.59ms.    The previously implanted atrial lead Medtronic  added P-wave amplitude of 3.3 illlivolts  and impedance of  558 ohms, and a pacing threshold of 0.8volts at 0.31milliseconds.  The leads were inspected. The leads were then attached to a Medtronic pulse generator, serial number [id number]  Adapta L.    The pocket was irrigated with antibiotic containing saline solution hemostasis was assured and the leads and the device were placed in the pocket. The wound is then closed in 3 layers in normal fashion.  The patient tolerated the procedure without apparent complication.  Sherryl Manges

## 2012-03-23 NOTE — Interval H&P Note (Signed)
History and Physical Interval Note:  03/23/2012 2:06 PM  Yolanda Spears  has presented today for surgery, with the diagnosis of eri  The various methods of treatment have been discussed with the patient and family. After consideration of risks, benefits and other options for treatment, the patient has consented to  Procedure(s) (LRB): PERMANENT PACEMAKER GENERATOR CHANGE (N/A) as a surgical intervention .  The patient's history has been reviewed, patient examined, no change in status, stable for surgery.  I have reviewed the patients' chart and labs.  Questions were answered to the patient's satisfaction.     Sherryl Manges

## 2012-03-23 NOTE — H&P (View-Only) (Signed)
HPI  Yolanda Spears is a 77 y.o. female Seen in followup for pacemaker implanted in 2007 for tachybradycardia syndrome .  She has paroxysmal atrial fibrillation managed with Rythmol as well as rate control with digoxin And metoprolol. She takes warfarin   She has had problems with lightheadedness and exercise intolerance which has been worse over the last couple of months.  We saw her in March. She reverted in early April to VVI 65       Past Medical History  Diagnosis Date  . Pacemaker   . Hypertension   . Peripheral neuropathy   . High cholesterol   . Heart murmur   . Shortness of breath on exertion   . Stroke     "small stroke & several TIA's"  . Atrial fibrillation   . History of radiation therapy   . Partial bowel obstruction JAN/2013  . Colon polyp 2011  . Endometrial cancer 2006    Stage IIIc  s/p radiation  . Breast cancer 1999    LOBULAR CARCINOMA IN SITU...    Past Surgical History  Procedure Date  . Lymph node resection 2007  . Cholecystectomy 1990's  . Insert / replace / remove pacemaker 2007    initial placement  . Mastectomy 1999    bilaterally w/lymph node dissection  . Abdominal hysterectomy 2006    BSO    Current Outpatient Prescriptions  Medication Sig Dispense Refill  . bifidobacterium infantis (ALIGN) capsule Take 1 capsule by mouth daily.  14 capsule  0  . calcium carbonate (TUMS - DOSED IN MG ELEMENTAL CALCIUM) 500 MG chewable tablet Chew 1 tablet by mouth daily.      . Cholecalciferol (VITAMIN D3) 5000 UNITS CAPS Take 1 capsule by mouth daily. Every other day      . furosemide (LASIX) 20 MG tablet TAKE 1 TABLET EVERY DAY  30 tablet  11  . hydrALAZINE (APRESOLINE) 25 MG tablet TAKE 1 TABLET BY MOUTH TWICE A DAY  60 tablet  12  . lisinopril (PRINIVIL,ZESTRIL) 10 MG tablet Take 1 tablet (10 mg total) by mouth 2 (two) times daily.  60 tablet  11  . metoprolol succinate (TOPROL-XL) 100 MG 24 hr tablet TAKE 1 TABLET BY MOUTH EVERY DAY  90  tablet  3  . pregabalin (LYRICA) 75 MG capsule Take 75 mg by mouth 2 (two) times daily.        . propafenone (RYTHMOL) 225 MG tablet TAKE 1 TABLET EVERY 8 HOURS  100 tablet  10  . warfarin (COUMADIN) 2.5 MG tablet Take 1 tablet (2.5 mg total) by mouth as directed.  32 tablet  11  . ZETIA 10 MG tablet TAKE 1/2 TABLETS BY MOUTH EVERY DAY  30 tablet  5    Allergies  Allergen Reactions  . Clindamycin/Lincomycin     heartburn  . Other     Contrast dye   . Penicillins   . Shellfish-Derived Products   . Statins     Myalgias,elevated LFT studies  . Iohexol Hives and Itching    Itching in eyes, one hive on face.    Review of Systems negative except from HPI and PMH  Physical Exam BP 136/72  Ht 5\' 7"  (1.702 m)  Wt 203 lb (92.08 kg)  BMI 31.79 kg/m2 Well developed and well nourished in no acute distress HENT normal E scleral and icterus clear Neck Supple JVP flat; carotids brisk and full Clear to ausculation Regular rate and rhythm,  variable S1 Soft  with active bowel sounds No clubbing cyanosis Trace Edema Alert and oriented, grossly normal motor and sensory function Skin Warm and Dry ECG demonstrates ventricular pacing with A-V dissociation and complete heart block    Assessment and  Plan

## 2012-03-29 ENCOUNTER — Telehealth: Payer: Self-pay | Admitting: Internal Medicine

## 2012-03-29 NOTE — Telephone Encounter (Signed)
Received 3 pages from [facility]. Sent to Dr. Rhea Belton. 03/29/12/SD

## 2012-03-29 NOTE — Telephone Encounter (Signed)
Received 2 pages from [facility]. 03/29/12/SD

## 2012-03-29 NOTE — Telephone Encounter (Signed)
Received 4 pages from [facility]. Sent to Dr. Rhea Belton. 03/29/12/SD

## 2012-03-31 ENCOUNTER — Encounter: Payer: Self-pay | Admitting: Internal Medicine

## 2012-03-31 ENCOUNTER — Ambulatory Visit: Payer: Medicare Other

## 2012-03-31 ENCOUNTER — Ambulatory Visit (INDEPENDENT_AMBULATORY_CARE_PROVIDER_SITE_OTHER): Payer: Medicare Other | Admitting: *Deleted

## 2012-03-31 ENCOUNTER — Encounter (INDEPENDENT_AMBULATORY_CARE_PROVIDER_SITE_OTHER): Payer: Medicare Other | Admitting: Nurse Practitioner

## 2012-03-31 DIAGNOSIS — I440 Atrioventricular block, first degree: Secondary | ICD-10-CM

## 2012-03-31 DIAGNOSIS — R0989 Other specified symptoms and signs involving the circulatory and respiratory systems: Secondary | ICD-10-CM

## 2012-03-31 DIAGNOSIS — I4891 Unspecified atrial fibrillation: Secondary | ICD-10-CM

## 2012-03-31 LAB — PACEMAKER DEVICE OBSERVATION
AL IMPEDENCE PM: 554 Ohm
AL THRESHOLD: 0.875 V
ATRIAL PACING PM: 84
VENTRICULAR PACING PM: 1

## 2012-03-31 NOTE — Progress Notes (Signed)
Wound check pacer in clinic  

## 2012-04-01 ENCOUNTER — Telehealth: Payer: Self-pay | Admitting: Internal Medicine

## 2012-04-01 NOTE — Telephone Encounter (Signed)
Pt wanted instructions for BS on Monday, 04/04/12. Informed pt to go to [facility] radiology at 0945am and verified there is no prep for the procedure and she eat and drink; pt stated understanding.

## 2012-04-04 ENCOUNTER — Ambulatory Visit (HOSPITAL_COMMUNITY)
Admission: RE | Admit: 2012-04-04 | Discharge: 2012-04-04 | Disposition: A | Discharge status: Home / Self Care | Payer: Medicare Other | Source: Ambulatory Visit | Attending: Internal Medicine | Admitting: Internal Medicine

## 2012-04-04 ENCOUNTER — Other Ambulatory Visit: Payer: Self-pay | Admitting: Cardiology

## 2012-04-04 DIAGNOSIS — R109 Unspecified abdominal pain: Secondary | ICD-10-CM | POA: Insufficient documentation

## 2012-04-04 DIAGNOSIS — R1030 Lower abdominal pain, unspecified: Secondary | ICD-10-CM

## 2012-04-04 DIAGNOSIS — R197 Diarrhea, unspecified: Secondary | ICD-10-CM | POA: Insufficient documentation

## 2012-04-07 ENCOUNTER — Telehealth: Payer: Self-pay | Admitting: Internal Medicine

## 2012-04-07 DIAGNOSIS — Z86010 Personal history of colonic polyps: Secondary | ICD-10-CM | POA: Insufficient documentation

## 2012-04-07 NOTE — Telephone Encounter (Signed)
Pt called wanting results of upper GI series. Told her per Dr. Rhea Belton: Upper GI series with small bowel follow-through was normal. There was no evidence of obstruction or mass. This is good news, but I still feel that her intermittent symptoms are due to intermittent partial small bowel obstructions. This is in keeping with the CT scan done in January 2013 I recommend repeat imaging for her when she develops the symptoms we discussed in clinic. We can also refer to general surgery for their opinion. Pt stated she will think about what she wants to do next whether or not see a surgeon or not since she was told this issue is due from scar tissue from a previous surgery.

## 2012-04-20 ENCOUNTER — Ambulatory Visit (INDEPENDENT_AMBULATORY_CARE_PROVIDER_SITE_OTHER): Payer: Medicare Other | Admitting: *Deleted

## 2012-04-20 ENCOUNTER — Ambulatory Visit (INDEPENDENT_AMBULATORY_CARE_PROVIDER_SITE_OTHER): Payer: Medicare Other | Admitting: Cardiology

## 2012-04-20 ENCOUNTER — Encounter: Payer: Self-pay | Admitting: Cardiology

## 2012-04-20 VITALS — BP 138/70 | HR 66 | Ht 67.0 in | Wt 196.0 lb

## 2012-04-20 DIAGNOSIS — G459 Transient cerebral ischemic attack, unspecified: Secondary | ICD-10-CM

## 2012-04-20 DIAGNOSIS — I4891 Unspecified atrial fibrillation: Secondary | ICD-10-CM

## 2012-04-20 DIAGNOSIS — I480 Paroxysmal atrial fibrillation: Secondary | ICD-10-CM

## 2012-04-20 DIAGNOSIS — I119 Hypertensive heart disease without heart failure: Secondary | ICD-10-CM

## 2012-04-20 DIAGNOSIS — E7800 Pure hypercholesterolemia, unspecified: Secondary | ICD-10-CM

## 2012-04-20 LAB — POCT INR: INR: 2

## 2012-04-20 NOTE — Progress Notes (Signed)
Yolanda Spears Date of Birth:  October 10, 1934 [facility] [address] [place], [place]  [address] [phone]         Fax   [phone]  History of Present Illness: This pleasant 77 year old woman is seen for a scheduled followup office visit.  The patient has a past history of tachybradycardia syndrome and had a pacemaker implanted in 2007.  She had a pacemaker generator change by Dr. Graciela Husbands on 03/23/12.  She is on chronic Coumadin anticoagulation.  Her INR today is 2.0 since we last saw her she has had blood sounds like a TIA.  While traveling to [facility] by car on August 3 she had an episode of difficulty speaking and words not coming out correctly which lasted about an hour and a half.  It cleared spontaneously.  She had another brief episode the next morning which also cleared.  She does not have any prior history of TIAs. Recently the patient also had a spell of severe gastric distress and has seen Dr. Rhea Belton who had her perform any barium swallow with small bowel follow-through.  That study was normal but it is still felt that the patient has been having intermittent episodes of partial small bowel obstruction related to her previous ovarian cancer surgery and radiation.  Current Outpatient Prescriptions  Medication Sig Dispense Refill  . aspirin 81 MG tablet Take 81 mg by mouth daily.      . bifidobacterium infantis (ALIGN) capsule Take 1 capsule by mouth daily.  14 capsule  0  . calcium carbonate (TUMS - DOSED IN MG ELEMENTAL CALCIUM) 500 MG chewable tablet Chew 1 tablet by mouth daily.      . Cholecalciferol (VITAMIN D3) 5000 UNITS CAPS Take 1 capsule by mouth every other day.       . ezetimibe (ZETIA) 10 MG tablet Take 5 mg by mouth daily.      . furosemide (LASIX) 20 MG tablet Take 20 mg by mouth daily.       . hydrALAZINE (APRESOLINE) 25 MG tablet Take 25 mg by mouth 2 (two) times daily.      Marland Kitchen lisinopril (PRINIVIL,ZESTRIL) 10 MG tablet TAKE 1  TABLET BY MOUTH TWICE A DAY  60 tablet  11  . metoprolol succinate (TOPROL-XL) 100 MG 24 hr tablet Take 100 mg by mouth daily. Take with or immediately following a meal.      . pregabalin (LYRICA) 75 MG capsule Take 75 mg by mouth 2 (two) times daily.        . propafenone (RYTHMOL) 225 MG tablet Take 225 mg by mouth every 8 (eight) hours.      Marland Kitchen warfarin (COUMADIN) 2.5 MG tablet Take 1.25-2.5 mg by mouth daily. Take 1.25mg  on mondays. Take 2.5mg  the rest of the week.        Allergies  Allergen Reactions  . Clindamycin/Lincomycin     heartburn  . Other     Contrast dye   . Penicillins   . Shellfish-Derived Products   . Statins     Myalgias,elevated LFT studies  . Iohexol Hives and Itching    Itching in eyes, one hive on face.    Patient Active Problem List  Diagnosis  . Atrial fibrillation  . First degree AV block  . Pacemaker- Dual-chamber-mdt  . Right bundle branch block  . Benign hypertensive heart disease without heart failure  . TIA (transient ischemic attack)  . Vertigo  . Hypercholesterolemia  . Partial small bowel obstruction  .  Orthostatic lightheadedness  . History of adenomatous polyp of colon    History  Smoking status  . Former Smoker -- 1.0 packs/day for 40 years  . Types: Cigarettes  . Quit date: 11/26/1998  Smokeless tobacco  . Never Used    History  Alcohol Use  . Yes    09/14/11 "last drink ~ 08/31/2009"    Family History  Problem Relation Age of Onset  . Cancer Mother 83    UTERINE  . Breast cancer Mother   . Hypertension Mother   . Uterine cancer Mother   . Cancer Father     PROSTATE  . Hypertension Father     Review of Systems: Constitutional: no fever chills diaphoresis or fatigue or change in weight.  Head and neck: no hearing loss, no epistaxis, no photophobia or visual disturbance. Respiratory: No cough, shortness of breath or wheezing. Cardiovascular: No chest pain peripheral edema, palpitations. Gastrointestinal: No  abdominal distention, no abdominal pain, no change in bowel habits hematochezia or melena. Genitourinary: No dysuria, no frequency, no urgency, no nocturia. Musculoskeletal:No arthralgias, no back pain, no gait disturbance or myalgias. Neurological: No dizziness, no headaches, no numbness, no seizures, no syncope, no weakness, no tremors. Hematologic: No lymphadenopathy, no easy bruising. Psychiatric: No confusion, no hallucinations, no sleep disturbance.    Physical Exam: Filed Vitals:   04/20/12 1402  BP: 138/70  Pulse: 66   general appearance reveals a well-developed well-nourished elderly woman in no distress.Pupils equal and reactive.   Extraocular Movements are full.  There is no scleral icterus.  The mouth and pharynx are normal.  The neck is supple.  The carotids reveal no bruits on careful auscultation  The jugular venous pressure is normal.  The thyroid is not enlarged.  There is no lymphadenopathy.The chest is clear to percussion and auscultation. There are no rales or rhonchi. Expansion of the chest is symmetrical.  The breasts are surgically absent. The heart reveals a regular rhythm without murmur gallop rub or click. The abdomen is soft and nontender.  Bowel sounds are present.  No hepatosplenomegaly or masses Extremities show no phlebitis or edema.  Neurologic exam reveals that she has decreased sensation in her lower extremities from peripheral neuropathy. The skin is warm and dry.  There is no rash.  EKG today shows normal sinus rhythm at 64 per minute.  There is a first degree AV block.  There is a right bundle branch block.  Pacer spikes are not presently seen.     Assessment / Plan: Patient is to continue same medication.  We will evaluate her for TIA with carotid Doppler and 2-D echo.  We will add aspirin 81 mg one daily to her regimen.  She was instructed to call us if she has any further episodes.  Otherwise we will plan to see her in 3 months for followup office  visit.

## 2012-04-20 NOTE — Assessment & Plan Note (Signed)
The patient had an suspected TIA manifested by expressive aphasia on August 3.  She does not have any prior history of similar episodes.  We will get carotid Dopplers and also update her two-dimensional echocardiogram.  We will also start her on aspirin 81 mg one daily.

## 2012-04-20 NOTE — Patient Instructions (Signed)
Add Aspirin 81 mg daily  Your physician has requested that you have a carotid duplex. This test is an ultrasound of the carotid arteries in your neck. It looks at blood flow through these arteries that supply the brain with blood. Allow one hour for this exam. There are no restrictions or special instructions.  Your physician has requested that you have an echocardiogram. Echocardiography is a painless test that uses sound waves to create images of your heart. It provides your doctor with information about the size and shape of your heart and how well your heart's chambers and valves are working. This procedure takes approximately one hour. There are no restrictions for this procedure.   Your physician recommends that you schedule a follow-up appointment in: 3 MONTHS

## 2012-04-20 NOTE — Assessment & Plan Note (Signed)
The patient has tachybradycardia syndrome with paroxysmal atrial fibrillation.  Her EKG today shows that she is in normal sinus rhythm with a first degree block

## 2012-04-20 NOTE — Assessment & Plan Note (Signed)
Patient has a history of hypercholesterolemia.  She is intolerant to statins.  She is able to take ezetimibe.

## 2012-05-04 ENCOUNTER — Ambulatory Visit (HOSPITAL_COMMUNITY): Payer: Medicare Other | Attending: Cardiology | Admitting: Radiology

## 2012-05-04 ENCOUNTER — Ambulatory Visit (INDEPENDENT_AMBULATORY_CARE_PROVIDER_SITE_OTHER): Payer: Medicare Other | Admitting: *Deleted

## 2012-05-04 DIAGNOSIS — Z87891 Personal history of nicotine dependence: Secondary | ICD-10-CM | POA: Insufficient documentation

## 2012-05-04 DIAGNOSIS — G459 Transient cerebral ischemic attack, unspecified: Secondary | ICD-10-CM

## 2012-05-04 DIAGNOSIS — I4891 Unspecified atrial fibrillation: Secondary | ICD-10-CM

## 2012-05-04 DIAGNOSIS — I379 Nonrheumatic pulmonary valve disorder, unspecified: Secondary | ICD-10-CM | POA: Insufficient documentation

## 2012-05-04 DIAGNOSIS — I080 Rheumatic disorders of both mitral and aortic valves: Secondary | ICD-10-CM | POA: Insufficient documentation

## 2012-05-04 DIAGNOSIS — I079 Rheumatic tricuspid valve disease, unspecified: Secondary | ICD-10-CM | POA: Insufficient documentation

## 2012-05-04 NOTE — Progress Notes (Signed)
Echocardiogram performed.  

## 2012-05-10 ENCOUNTER — Encounter (INDEPENDENT_AMBULATORY_CARE_PROVIDER_SITE_OTHER): Payer: Medicare Other

## 2012-05-10 DIAGNOSIS — G459 Transient cerebral ischemic attack, unspecified: Secondary | ICD-10-CM

## 2012-05-11 ENCOUNTER — Other Ambulatory Visit: Payer: Self-pay | Admitting: *Deleted

## 2012-05-11 ENCOUNTER — Other Ambulatory Visit: Payer: Self-pay | Admitting: Cardiology

## 2012-05-12 ENCOUNTER — Telehealth: Payer: Self-pay | Admitting: *Deleted

## 2012-05-12 NOTE — Telephone Encounter (Signed)
Message copied by Burnell Blanks on Thu May 12, 2012  5:03 PM ------      Message from: Cassell Clement      Created: Wed May 04, 2012  9:32 PM       Echo was satisfactory. Good LV systolic function. Diastolic stiffness is present. Mild AI and MR.  CSD

## 2012-05-12 NOTE — Telephone Encounter (Signed)
Advised of echo  

## 2012-05-27 ENCOUNTER — Ambulatory Visit (INDEPENDENT_AMBULATORY_CARE_PROVIDER_SITE_OTHER): Payer: Medicare Other | Admitting: *Deleted

## 2012-05-27 DIAGNOSIS — I4891 Unspecified atrial fibrillation: Secondary | ICD-10-CM

## 2012-05-30 ENCOUNTER — Telehealth: Payer: Self-pay | Admitting: *Deleted

## 2012-05-30 NOTE — Telephone Encounter (Signed)
Advised patient

## 2012-05-30 NOTE — Telephone Encounter (Signed)
Message copied by Burnell Blanks on Mon May 30, 2012 12:51 PM ------      Message from: Cassell Clement      Created: Sun May 29, 2012  4:46 PM       Carotids show only mild  Plaque but no stenosis. CSD.Careful diet to decrease cholesterol and future plaque.

## 2012-05-31 NOTE — Progress Notes (Signed)
This encounter was created in error - please disregard.

## 2012-06-10 ENCOUNTER — Ambulatory Visit (INDEPENDENT_AMBULATORY_CARE_PROVIDER_SITE_OTHER): Payer: Medicare Other | Admitting: *Deleted

## 2012-06-10 DIAGNOSIS — I4891 Unspecified atrial fibrillation: Secondary | ICD-10-CM

## 2012-06-10 LAB — POCT INR: INR: 2.6

## 2012-07-01 ENCOUNTER — Encounter: Payer: Self-pay | Admitting: Internal Medicine

## 2012-07-01 ENCOUNTER — Ambulatory Visit (INDEPENDENT_AMBULATORY_CARE_PROVIDER_SITE_OTHER): Payer: Medicare Other | Admitting: Pharmacist

## 2012-07-01 ENCOUNTER — Ambulatory Visit (INDEPENDENT_AMBULATORY_CARE_PROVIDER_SITE_OTHER): Payer: Medicare Other | Admitting: Internal Medicine

## 2012-07-01 VITALS — BP 170/70 | HR 60 | Wt 202.0 lb

## 2012-07-01 DIAGNOSIS — Z950 Presence of cardiac pacemaker: Secondary | ICD-10-CM

## 2012-07-01 DIAGNOSIS — I495 Sick sinus syndrome: Secondary | ICD-10-CM

## 2012-07-01 DIAGNOSIS — I4891 Unspecified atrial fibrillation: Secondary | ICD-10-CM

## 2012-07-01 DIAGNOSIS — G459 Transient cerebral ischemic attack, unspecified: Secondary | ICD-10-CM

## 2012-07-01 DIAGNOSIS — R42 Dizziness and giddiness: Secondary | ICD-10-CM

## 2012-07-01 NOTE — Assessment & Plan Note (Signed)
I am not sure whether this is vertigo or orthostatic intolerance associated with some neuropathy. Orthostatic vital signs today were associated with a 20 mm drop but unassociated with symptoms. I suspect that more prolonged standing or tilt table testing would unmask orthostatic stresses.

## 2012-07-01 NOTE — Progress Notes (Signed)
Patient Care Team: Thayer Headings, MD as PCP - General   HPI  Yolanda Spears is a 77 y.o. female and seen in followup for tachybradycardia syndrome with paroxysmal atrial fibrillation. She has been treated with Rythmol metoprolol and warfarin.  She had a spell in August where she had transient difficulty speaking clear spontaneously. Aspirin was added She is status post pacemaker implantation 2007 for tachybradycardia syndrome and underwent generator replacement 7/13  Her Coumadin/INR stability remains poor  She is struggling with orthostatic intolerance and gait instability. She also finds that she stands too long her legs get weak. She has to sit down for relief.  Past Medical History  Diagnosis Date  . Pacemaker   . Hypertension   . Peripheral neuropathy   . High cholesterol   . Heart murmur   . Shortness of breath on exertion   . Stroke     "small stroke & several TIA's"  . Atrial fibrillation   . History of radiation therapy   . Partial bowel obstruction JAN/2013  . Colon polyp 2011  . Endometrial cancer 2006    Stage IIIc  s/p radiation  . Breast cancer 1999    LOBULAR CARCINOMA IN SITU...    Past Surgical History  Procedure Date  . Lymph node resection 2007  . Cholecystectomy 1990's  . Insert / replace / remove pacemaker 2007    initial placement  . Mastectomy 1999    bilaterally w/lymph node dissection  . Abdominal hysterectomy 2006    BSO    Current Outpatient Prescriptions  Medication Sig Dispense Refill  . aspirin 81 MG tablet Take 81 mg by mouth daily.      . calcium carbonate (TUMS - DOSED IN MG ELEMENTAL CALCIUM) 500 MG chewable tablet Chew 1 tablet by mouth daily.      . Cholecalciferol (VITAMIN D3) 5000 UNITS CAPS Take 1 capsule by mouth every other day.       . ezetimibe (ZETIA) 10 MG tablet Take 5 mg by mouth daily.      . furosemide (LASIX) 20 MG tablet Take 20 mg by mouth daily.       . hydrALAZINE (APRESOLINE) 25 MG tablet Take 25 mg by  mouth 2 (two) times daily.      Marland Kitchen lisinopril (PRINIVIL,ZESTRIL) 10 MG tablet TAKE 1 TABLET BY MOUTH TWICE A DAY  60 tablet  11  . metoprolol succinate (TOPROL-XL) 100 MG 24 hr tablet Take 100 mg by mouth daily. Take with or immediately following a meal.      . pregabalin (LYRICA) 75 MG capsule Take 75 mg by mouth 2 (two) times daily.        . propafenone (RYTHMOL) 225 MG tablet Take 225 mg by mouth every 8 (eight) hours.      Marland Kitchen warfarin (COUMADIN) 2.5 MG tablet Take as directed by coumadin clinic  40 tablet  3    Allergies  Allergen Reactions  . Clindamycin/Lincomycin     heartburn  . Other     Contrast dye   . Penicillins   . Shellfish-Derived Products   . Statins     Myalgias,elevated LFT studies  . Iohexol Hives and Itching    Itching in eyes, one hive on face.    Review of Systems negative except from HPI and PMH  Physical Exam BP 170/70  Pulse 60  Wt 202 lb (91.627 kg) Well developed and well nourished in no acute distress HENT normal E scleral and icterus clear Neck  Supple JVP flat; carotids brisk and full Device pocket well healed; without hematoma or erythema Clear to ausculation Regular rate and rhythm queit S1 Soft with active bowel sounds No clubbing cyanosis none Edema Alert and oriented, grossly normal motor and sensory function Skin Warm and Dry    Assessment and  Plan

## 2012-07-01 NOTE — Assessment & Plan Note (Signed)
The patient's device was interrogated.  The information was reviewed. No changes were made in the programming.    

## 2012-07-01 NOTE — Assessment & Plan Note (Signed)
The patient had a TIA while on Coumadin. I have spoken with Dr. Abner Greenspan who relates that data do not support the value of adding aspirin to Coumadin. What I have seen previously with increasing target INR from 2-3->>>>2 5. 3.5

## 2012-07-01 NOTE — Patient Instructions (Signed)
Your physician wants you to follow-up in: July 2014 You will receive a reminder letter in the mail two months in advance. If you don't receive a letter, please call our office to schedule the follow-up appointment.   XARELTO= PRADAXA= ELIQUIS TO REPLACE COUMADIN

## 2012-07-01 NOTE — Assessment & Plan Note (Signed)
Patient is anticoagulated in the context of her atrial fibrillation

## 2012-07-08 LAB — PACEMAKER DEVICE OBSERVATION
BAMS-0001: 150 {beats}/min
BATTERY VOLTAGE: 2.79 V
VENTRICULAR PACING PM: 0.9

## 2012-07-10 IMAGING — CT CT ABD-PELV W/O CM
1 of 2 series · 15 of 32 positions shown, 19 images · non-contrast
Comparison: 09/14/2011 abdominal radiographs.

CLINICAL DATA: Abdominal pain.  Vomiting.

CT ABDOMEN AND PELVIS WITHOUT CONTRAST
TECHNIQUE: Multidetector CT imaging of the abdomen and pelvis was
performed following the standard protocol without intravenous
contrast.

[Series 2: abd/pelv w/o 5.0 b31f st · axial · non-contrast · 0.80mm/px · z∈[-486,-56]mm · 15 of 94 slices shown, 19 images]
[im 4/94  soft-tissue]
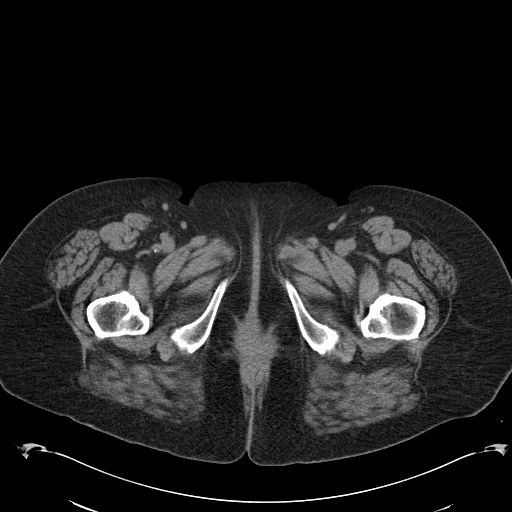
[im 4/94  bone]
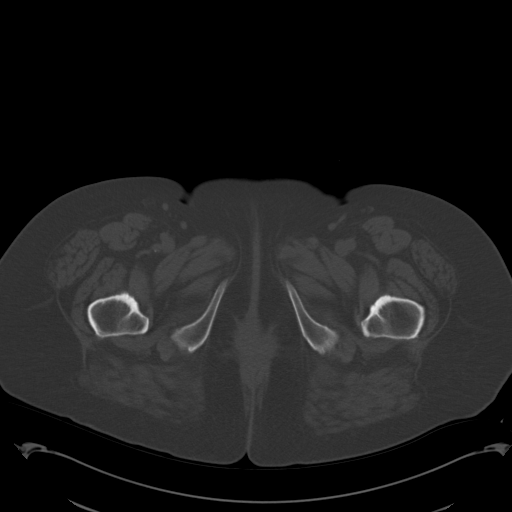
[im 12/94  soft-tissue]
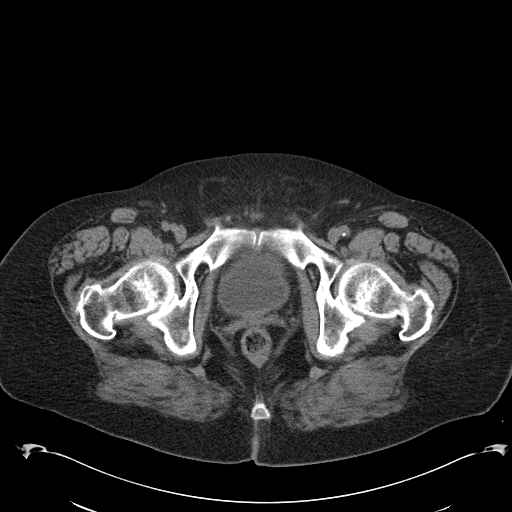
[im 20/94  soft-tissue]
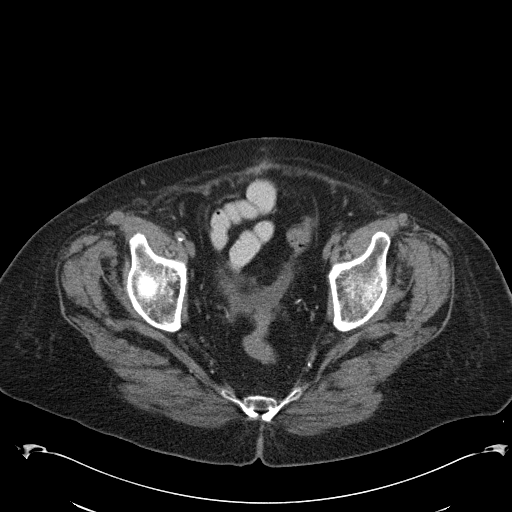
[im 28/94  soft-tissue]
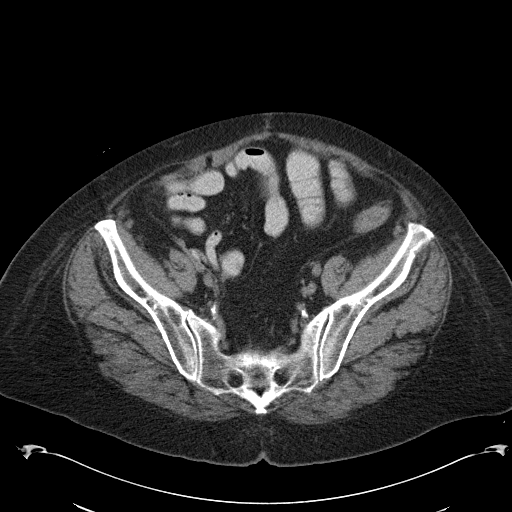
[im 32/94  soft-tissue]
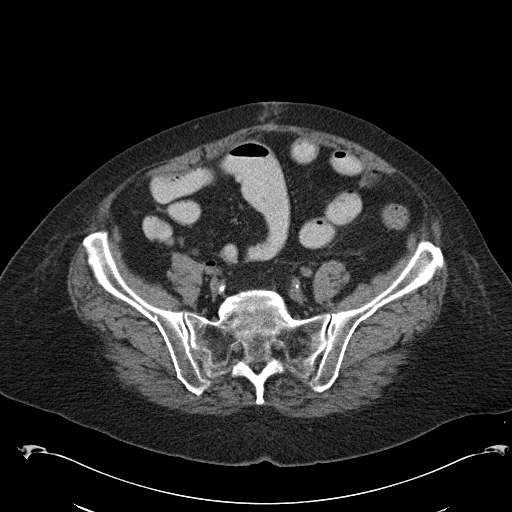
[im 39/94  soft-tissue]
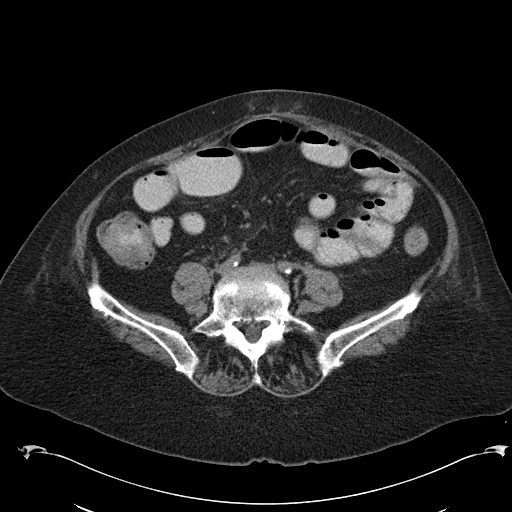
[im 47/94  soft-tissue]
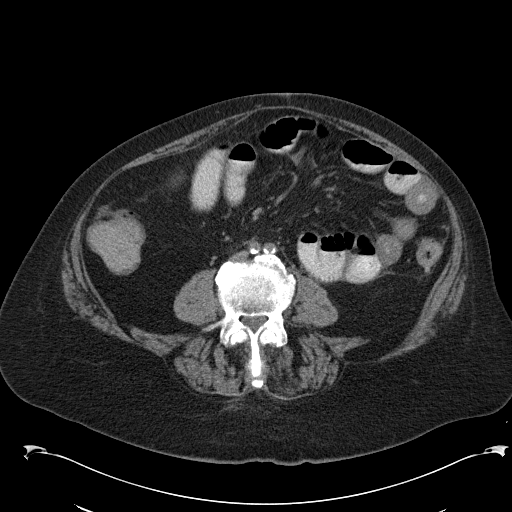
[im 55/94  soft-tissue]
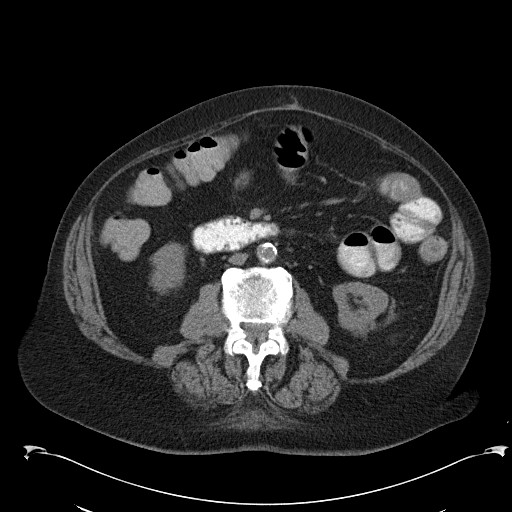
[im 63/94  soft-tissue]
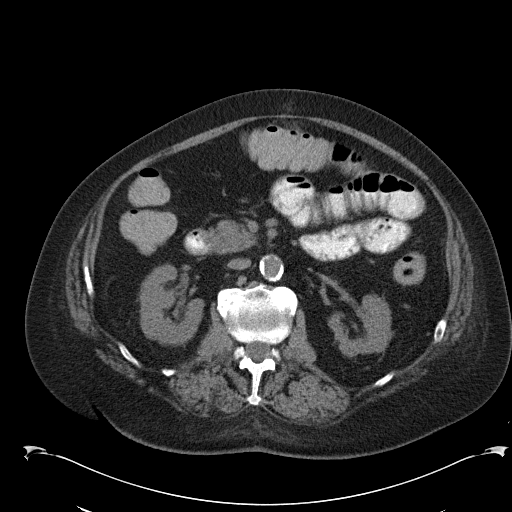
[im 63/94  bone]
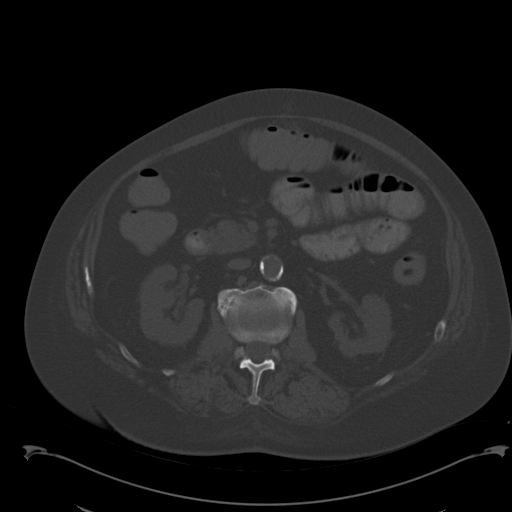
[im 66/94  soft-tissue]
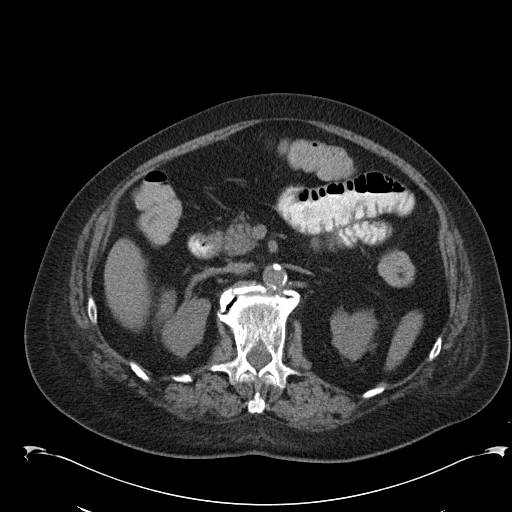
[im 74/94  soft-tissue]
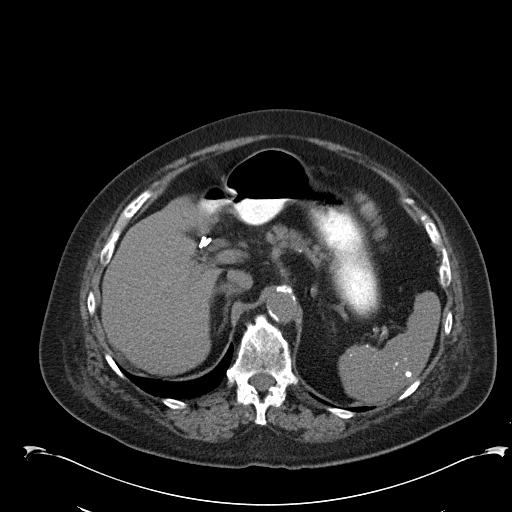
[im 78/94  lung]
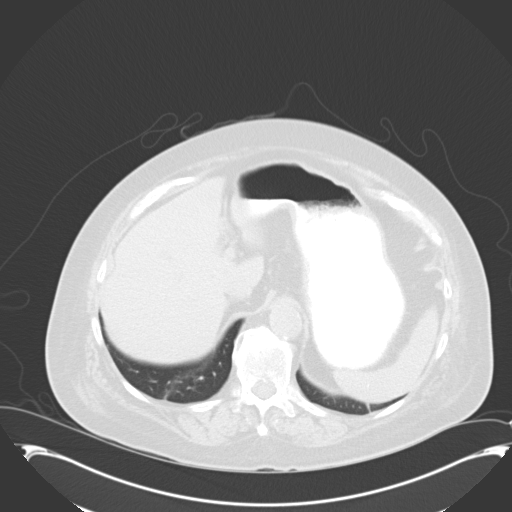
[im 82/94  soft-tissue]
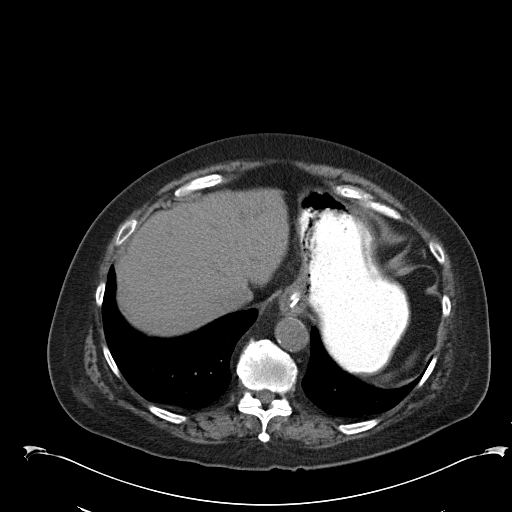
[im 82/94  lung]
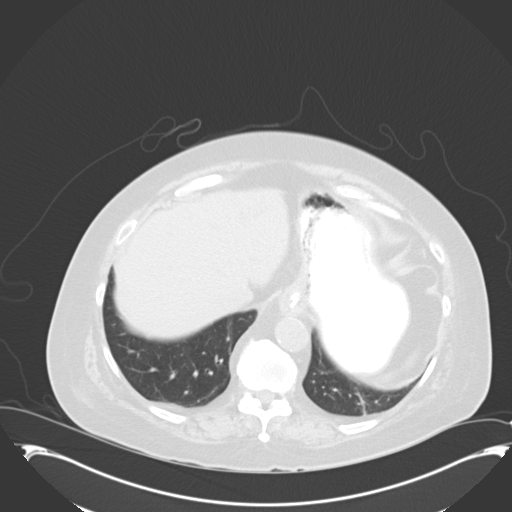
[im 86/94  lung]
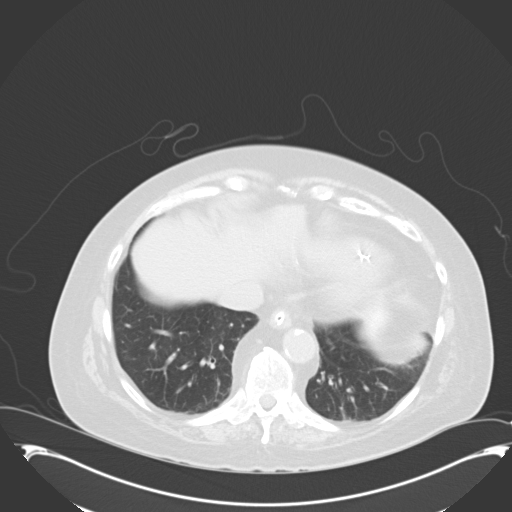
[im 90/94  soft-tissue]
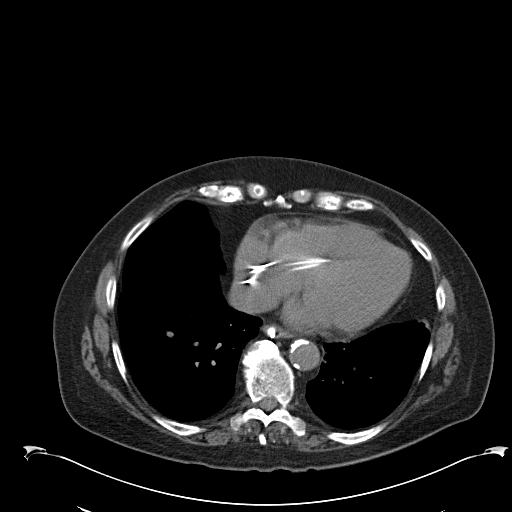
[im 90/94  lung]
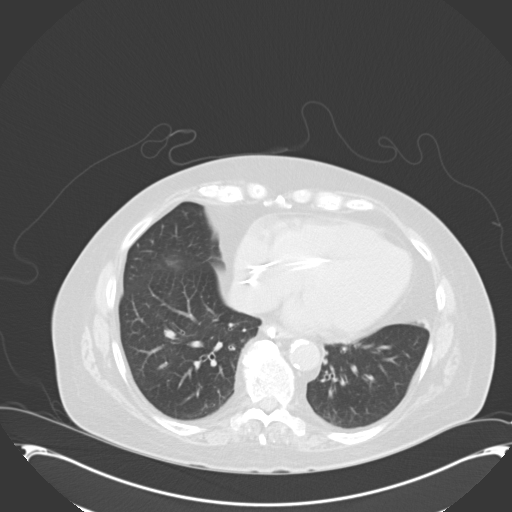

[15 of 32 positions shown; findings below may reference images not displayed]

FINDINGS: Partially visualized pacemaker leads in the heart.
Nasogastric tube is present with the tip in the stomach. Unenhanced
CT was performed per clinician order.  Lack of IV contrast limits
sensitivity and specificity, especially for evaluation of
abdominal/pelvic solid viscera.  Liver appears normal.  Old
granulomatous disease of the spleen.  Aorto iliofemoral
atherosclerosis without aneurysm.  Nonspecific bilateral
perinephric stranding.  No urolithiasis.  The ureters appear within
normal limits.  Midline abdominal scar is present.  Colonic
diverticulosis without diverticulitis.  Urinary bladder appears
normal.  Partial small bowel obstruction is present with contrast
extending to the colon.  Normal appendix is present.  Mildly
dilated small bowel extends into the anatomic pelvis.  There is a
transition point in the terminal ileum (image 61 series 2).  The
findings are compatible with adhesive small bowel obstruction.

Pancreas appears within normal limits.  Cholecystectomy.  No
aggressive osseous lesions are present.
IMPRESSION: 1.  Adhesive partial small bowel obstruction.  This appears low
grade, with oral contrast reaching the colon.
2.  Atherosclerosis.
3.  Old granulomatous disease.
4.  Nasogastric tube with the tip in the stomach.

## 2012-07-10 IMAGING — CR DG ABDOMEN 2V
2 series · 2 of 2 positions shown · non-contrast
Comparison: None.

CLINICAL DATA: Abdominal pain.  Vomiting.  Nausea.  History of
cholecystectomy.

ABDOMEN - 2 VIEW

[w abdomen upright]
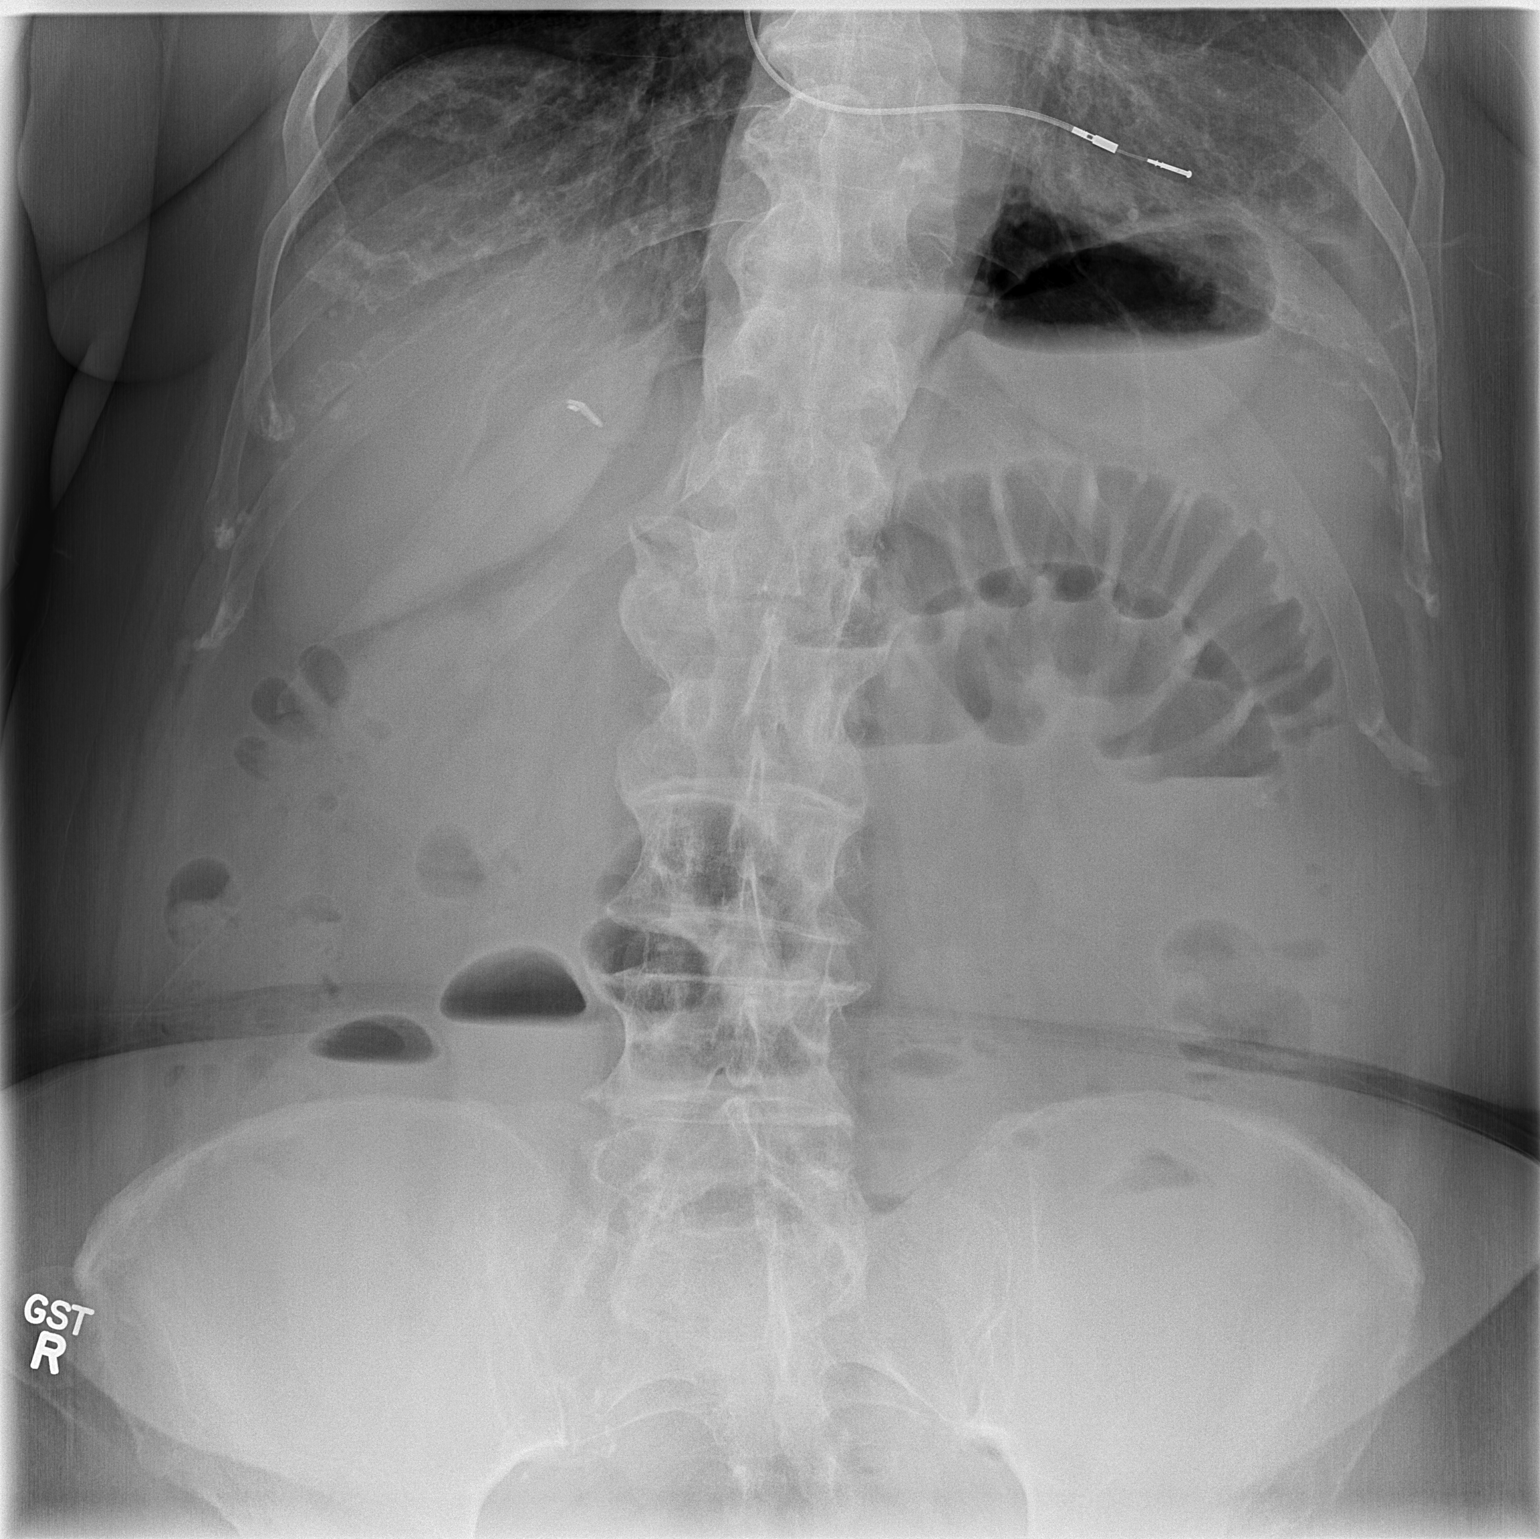

[t abdomen supine]
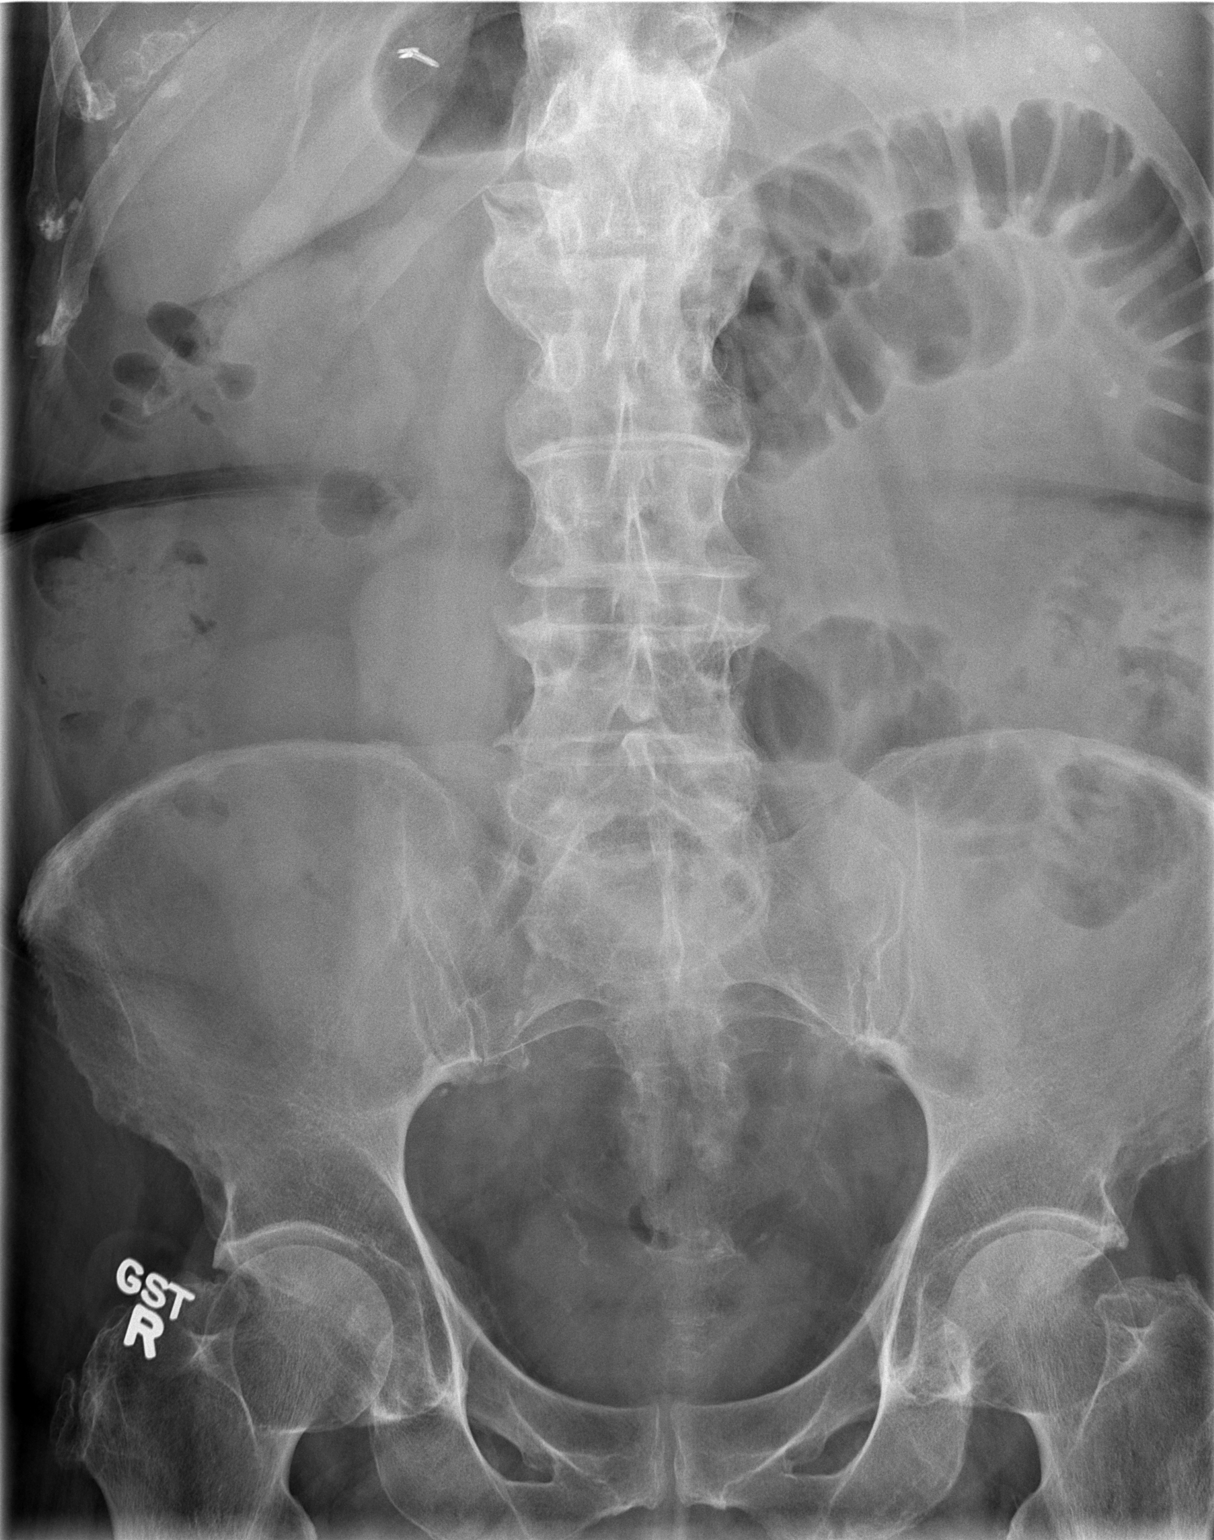

[2 of 2 positions shown; findings below may reference images not displayed]

FINDINGS: Dilated loops of small bowel are present within the
central abdomen, measuring up to 44 mm.

Air fluid levels are present.  There is no plain film evidence of
free air.  Paucity of distal colonic gas.  In the dilated loops of
left central abdominal small bowel, there is mild mural thickening.

Cholecystectomy clips are present in the right upper quadrant.
Partially visualized pacemaker lead.
IMPRESSION: Dilated loops of small bowel in the left central abdomen with
multiple air-fluid levels compatible with small bowel obstruction.

## 2012-07-21 ENCOUNTER — Ambulatory Visit (INDEPENDENT_AMBULATORY_CARE_PROVIDER_SITE_OTHER): Payer: Medicare Other | Admitting: Cardiology

## 2012-07-21 ENCOUNTER — Encounter: Payer: Self-pay | Admitting: Cardiology

## 2012-07-21 ENCOUNTER — Ambulatory Visit (INDEPENDENT_AMBULATORY_CARE_PROVIDER_SITE_OTHER): Payer: Medicare Other | Admitting: *Deleted

## 2012-07-21 VITALS — BP 133/64 | HR 70 | Resp 18 | Ht 67.0 in | Wt 204.0 lb

## 2012-07-21 DIAGNOSIS — I119 Hypertensive heart disease without heart failure: Secondary | ICD-10-CM

## 2012-07-21 DIAGNOSIS — I4891 Unspecified atrial fibrillation: Secondary | ICD-10-CM

## 2012-07-21 DIAGNOSIS — R42 Dizziness and giddiness: Secondary | ICD-10-CM

## 2012-07-21 DIAGNOSIS — E7800 Pure hypercholesterolemia, unspecified: Secondary | ICD-10-CM

## 2012-07-21 DIAGNOSIS — I480 Paroxysmal atrial fibrillation: Secondary | ICD-10-CM

## 2012-07-21 NOTE — Assessment & Plan Note (Signed)
The patient has not been aware of any palpitations or tachycardia.  He remains on Rythmol and Toprol for her antiarrhythmic therapy.  She is on long-term Coumadin.  She asked about possible switch to one of the novel oral anticoagulant drugs which would be reasonable for her.  She is not sure about the out-of-pocket cost.  She will investigate further and let us know.

## 2012-07-21 NOTE — Assessment & Plan Note (Signed)
At her last visit with Dr. Graciela Husbands her blood pressure was unusually high.  When she went home that day she realized that she had taken none for blood pressure medications that day.  Today her blood pressure has been good in the range of 133/64.  We will continue her current medications

## 2012-07-21 NOTE — Progress Notes (Signed)
Yolanda Spears Date of Birth:  Feb 03, 1935 [facility] [address] [place], [place]  [address] [phone]         Fax   [phone]  History of Present Illness: This pleasant 77 year old woman is seen for a scheduled followup office visit. The patient has a past history of tachybradycardia syndrome and had a pacemaker implanted in 2007. She had a pacemaker generator change by Dr. Graciela Husbands on 03/23/12. She is on chronic Coumadin anticoagulation.  The patient had what sounds like a possible TIA during the summer.  She has had no recurrence.  She is now on a baby aspirin as well as chronic Coumadin. The patient has had episodes of gastric distress and has seen [facility], Dr. Rhea Belton.  She had a barium swallow with small bowel follow through which was normal but it is still felt that the patient has been having intermittent episodes of partial small bowel obstruction related to her previous ovarian cancer surgery and radiation.   Current Outpatient Prescriptions  Medication Sig Dispense Refill  . aspirin 81 MG tablet Take 81 mg by mouth daily.      . calcium carbonate (TUMS - DOSED IN MG ELEMENTAL CALCIUM) 500 MG chewable tablet Chew 1 tablet by mouth daily.      . Cholecalciferol (VITAMIN D3) 5000 UNITS CAPS Take 1 capsule by mouth every other day.       . ezetimibe (ZETIA) 10 MG tablet Take 5 mg by mouth daily.      . furosemide (LASIX) 20 MG tablet Take 20 mg by mouth daily.       . hydrALAZINE (APRESOLINE) 25 MG tablet Take 25 mg by mouth 2 (two) times daily.      Marland Kitchen lisinopril (PRINIVIL,ZESTRIL) 10 MG tablet TAKE 1 TABLET BY MOUTH TWICE A DAY  60 tablet  11  . metoprolol succinate (TOPROL-XL) 100 MG 24 hr tablet Take 100 mg by mouth daily. Take with or immediately following a meal.      . pregabalin (LYRICA) 75 MG capsule Take 75 mg by mouth 2 (two) times daily.        . propafenone (RYTHMOL) 225 MG tablet Take 225 mg by mouth every 8 (eight) hours.      Marland Kitchen warfarin  (COUMADIN) 2.5 MG tablet Take as directed by coumadin clinic  40 tablet  3    Allergies  Allergen Reactions  . Clindamycin/Lincomycin     heartburn  . Other     Contrast dye   . Penicillins   . Shellfish-Derived Products   . Statins     Myalgias,elevated LFT studies  . Iohexol Hives and Itching    Itching in eyes, one hive on face.    Patient Active Problem List  Diagnosis  . Atrial fibrillation  . First degree AV block  . Pacemaker- Dual-chamber-mdt  . Right bundle branch block  . Benign hypertensive heart disease without heart failure  . TIA (transient ischemic attack)  . Vertigo  . Hypercholesterolemia  . Partial small bowel obstruction  . Orthostatic lightheadedness  . History of adenomatous polyp of colon    History  Smoking status  . Former Smoker -- 1.0 packs/day for 40 years  . Types: Cigarettes  . Quit date: 11/26/1998  Smokeless tobacco  . Never Used    History  Alcohol Use  . Yes    Comment: 09/14/11 "last drink ~ 08/31/2009"    Family History  Problem Relation Age of Onset  . Cancer  Mother 61    UTERINE  . Breast cancer Mother   . Hypertension Mother   . Uterine cancer Mother   . Cancer Father     PROSTATE  . Hypertension Father     Review of Systems: Constitutional: no fever chills diaphoresis or fatigue or change in weight.  Head and neck: no hearing loss, no epistaxis, no photophobia or visual disturbance. Respiratory: No cough, shortness of breath or wheezing. Cardiovascular: No chest pain peripheral edema, palpitations. Gastrointestinal: No abdominal distention, no abdominal pain, no change in bowel habits hematochezia or melena. Genitourinary: No dysuria, no frequency, no urgency, no nocturia. Musculoskeletal:No arthralgias, no back pain, no gait disturbance or myalgias. Neurological: No dizziness, no headaches, no numbness, no seizures, no syncope, no weakness, no tremors. Hematologic: No lymphadenopathy, no easy  bruising. Psychiatric: No confusion, no hallucinations, no sleep disturbance.    Physical Exam: Filed Vitals:   07/21/12 1458  BP: 133/64  Pulse: 70  Resp: 18   the general appearance reveals a well-developed well-nourished woman in no distress.The head and neck exam reveals pupils equal and reactive.  Extraocular movements are full.  There is no scleral icterus.  The mouth and pharynx are normal.  The neck is supple.  The carotids reveal no bruits.  The jugular venous pressure is normal.  The  thyroid is not enlarged.  There is no lymphadenopathy.  The chest is clear to percussion and auscultation.  There are no rales or rhonchi.  Expansion of the chest is symmetrical.  The precordium is quiet.  The first heart sound is normal.  The second heart sound is physiologically split.  There is no murmur gallop rub or click.  There is no abnormal lift or heave.  The abdomen is soft and nontender.  The bowel sounds are normal.  The liver and spleen are not enlarged.  There are no abdominal masses.  There are no abdominal bruits.  Extremities reveal good pedal pulses.  There is no phlebitis and there is mild pretibial edema.  There is no cyanosis or clubbing.  Strength is normal and symmetrical in all extremities.  There is no lateralizing weakness.  There are no sensory deficits.  The skin is warm and dry.  There is no rash.    Assessment / Plan: The patient is to continue same medication.  Work harder on careful diet and weight loss.  Recheck in 4 months for office visit and EKG.  Her lipids are followed by her PCP.

## 2012-07-21 NOTE — Assessment & Plan Note (Signed)
Patient has a history of hypercholesterolemia.  She has not been as careful with her diet and her weight is up 8 pounds.  She is intolerant of statins and is on ezetimibe.

## 2012-07-21 NOTE — Patient Instructions (Addendum)
Your physician recommends that you continue on your current medications as directed. Please refer to the Current Medication list given to you today.  Your physician recommends that you schedule a follow-up appointment in: 4 month ov/keg

## 2012-07-21 NOTE — Assessment & Plan Note (Signed)
The patient has a history of vertigo and has to be careful about not turning too quickly or getting up from a chair to quickly.  She uses a walking cane.  He has had no recent falls.

## 2012-08-11 ENCOUNTER — Ambulatory Visit (INDEPENDENT_AMBULATORY_CARE_PROVIDER_SITE_OTHER): Payer: Medicare Other | Admitting: Pharmacist

## 2012-08-11 DIAGNOSIS — I4891 Unspecified atrial fibrillation: Secondary | ICD-10-CM

## 2012-08-11 LAB — POCT INR: INR: 1.8

## 2012-09-02 ENCOUNTER — Ambulatory Visit (INDEPENDENT_AMBULATORY_CARE_PROVIDER_SITE_OTHER): Payer: Medicare Other | Admitting: *Deleted

## 2012-09-02 DIAGNOSIS — I4891 Unspecified atrial fibrillation: Secondary | ICD-10-CM

## 2012-09-02 LAB — POCT INR: INR: 3

## 2012-10-03 ENCOUNTER — Ambulatory Visit (INDEPENDENT_AMBULATORY_CARE_PROVIDER_SITE_OTHER): Payer: Medicare Other | Admitting: *Deleted

## 2012-10-03 DIAGNOSIS — I4891 Unspecified atrial fibrillation: Secondary | ICD-10-CM

## 2012-10-27 ENCOUNTER — Ambulatory Visit (INDEPENDENT_AMBULATORY_CARE_PROVIDER_SITE_OTHER): Payer: Medicare Other | Admitting: Pharmacist

## 2012-11-14 ENCOUNTER — Other Ambulatory Visit: Payer: Self-pay

## 2012-11-14 MED ORDER — WARFARIN SODIUM 2.5 MG PO TABS: ORAL_TABLET | ORAL | Status: DC

## 2012-11-17 ENCOUNTER — Encounter: Payer: Self-pay | Admitting: Cardiology

## 2012-11-18 ENCOUNTER — Ambulatory Visit: Payer: Medicare Other | Admitting: Cardiology

## 2012-11-21 ENCOUNTER — Ambulatory Visit: Payer: Medicare Other | Admitting: Cardiology

## 2012-11-23 ENCOUNTER — Ambulatory Visit (INDEPENDENT_AMBULATORY_CARE_PROVIDER_SITE_OTHER): Payer: Medicare Other | Admitting: Cardiology

## 2012-11-23 ENCOUNTER — Ambulatory Visit (INDEPENDENT_AMBULATORY_CARE_PROVIDER_SITE_OTHER): Payer: Medicare Other | Admitting: Pharmacist

## 2012-11-23 ENCOUNTER — Encounter: Payer: Self-pay | Admitting: Cardiology

## 2012-11-23 ENCOUNTER — Encounter: Payer: Self-pay | Admitting: Internal Medicine

## 2012-11-23 VITALS — BP 122/64 | HR 72 | Ht 67.0 in | Wt 205.1 lb

## 2012-11-23 LAB — POCT INR: INR: 2.8

## 2012-11-23 NOTE — Progress Notes (Signed)
Yolanda Spears Date of Birth:  July 24, 1935 [facility] [address] [place], [place]  [address] [phone]         Fax   [phone]  History of Present Illness: This pleasant 78 year old woman is seen for a scheduled followup office visit. The patient has a past history of tachybradycardia syndrome and had a pacemaker implanted in 2007. She had a pacemaker generator change by Dr. Graciela Husbands on 03/23/12. She is on chronic Coumadin anticoagulation. The patient had what sounds like a possible TIA during the summer. She has had no recurrence. She is now on a baby aspirin as well as chronic Coumadin.  The patient has not been having any lightheadedness or syncope.  She has not been aware of any racing of her heart.  No palpitations.  He does have chronic positional vertigo when she changes positions or rolls over in bed.  She has a history of peripheral neuropathy and sees Dr. Terrace Arabia who has her on Lyrica.    Current Outpatient Prescriptions  Medication Sig Dispense Refill  . aspirin 81 MG tablet Take 81 mg by mouth daily.      . calcium carbonate (TUMS - DOSED IN MG ELEMENTAL CALCIUM) 500 MG chewable tablet Chew 1 tablet by mouth daily.      . Cholecalciferol (VITAMIN D3) 5000 UNITS CAPS Take 1 capsule by mouth every other day.       . ezetimibe (ZETIA) 10 MG tablet Take 5 mg by mouth daily.      . furosemide (LASIX) 20 MG tablet Take 20 mg by mouth daily.       . hydrALAZINE (APRESOLINE) 25 MG tablet Take 25 mg by mouth 2 (two) times daily.      Marland Kitchen lisinopril (PRINIVIL,ZESTRIL) 10 MG tablet TAKE 1 TABLET BY MOUTH TWICE A DAY  60 tablet  11  . metoprolol succinate (TOPROL-XL) 100 MG 24 hr tablet Take 100 mg by mouth daily. Take with or immediately following a meal.      . pregabalin (LYRICA) 75 MG capsule Take 75 mg by mouth 2 (two) times daily.        . propafenone (RYTHMOL) 225 MG tablet Take 225 mg by mouth every 8 (eight) hours.      Marland Kitchen warfarin (COUMADIN) 2.5 MG tablet Take  as directed by coumadin clinic  40 tablet  3   No current facility-administered medications for this visit.    Allergies  Allergen Reactions  . Clindamycin/Lincomycin     heartburn  . Other     Contrast dye   . Penicillins   . Shellfish-Derived Products   . Statins     Myalgias,elevated LFT studies  . Iohexol Hives and Itching    Itching in eyes, one hive on face.    Patient Active Problem List  Diagnosis  . Atrial fibrillation  . First degree AV block  . Pacemaker- Dual-chamber-mdt  . Right bundle branch block  . Benign hypertensive heart disease without heart failure  . TIA (transient ischemic attack)  . Vertigo  . Hypercholesterolemia  . Partial small bowel obstruction  . Orthostatic lightheadedness  . History of adenomatous polyp of colon    History  Smoking status  . Former Smoker -- 1.00 packs/day for 40 years  . Types: Cigarettes  . Quit date: 11/26/1998  Smokeless tobacco  . Never Used    History  Alcohol Use  . Yes    Comment: 09/14/11 "last drink ~ 08/31/2009"    Family  History  Problem Relation Age of Onset  . Cancer Mother 70    UTERINE  . Breast cancer Mother   . Hypertension Mother   . Uterine cancer Mother   . Cancer Father     PROSTATE  . Hypertension Father     Review of Systems: Constitutional: no fever chills diaphoresis or fatigue or change in weight.  Head and neck: no hearing loss, no epistaxis, no photophobia or visual disturbance. Respiratory: No cough, shortness of breath or wheezing. Cardiovascular: No chest pain peripheral edema, palpitations. Gastrointestinal: No abdominal distention, no abdominal pain, no change in bowel habits hematochezia or melena. Genitourinary: No dysuria, no frequency, no urgency, no nocturia. Musculoskeletal:No arthralgias, no back pain, no gait disturbance or myalgias. Neurological: No dizziness, no headaches, no numbness, no seizures, no syncope, no weakness, no tremors. Hematologic: No  lymphadenopathy, no easy bruising. Psychiatric: No confusion, no hallucinations, no sleep disturbance.    Physical Exam: Filed Vitals:   11/23/12 1101  BP: 122/64  Pulse: 72   the general appearance reveals a well-developed well-nourished middle-age woman in no distress.The head and neck exam reveals pupils equal and reactive.  Extraocular movements are full.  There is no scleral icterus.  The mouth and pharynx are normal.  The neck is supple.  The carotids reveal no bruits.  The jugular venous pressure is normal.  The  thyroid is not enlarged.  There is no lymphadenopathy.  The chest is clear to percussion and auscultation.  There are no rales or rhonchi.  Expansion of the chest is symmetrical.  The precordium is quiet.  The first heart sound is normal.  The second heart sound is physiologically split.  There is no murmur gallop rub or click.  There is no abnormal lift or heave.  The abdomen is soft and nontender.  The bowel sounds are normal.  The liver and spleen are not enlarged.  There are no abdominal masses.  There are no abdominal bruits.  Extremities reveal good pedal pulses.  There is no phlebitis or edema.  There is no cyanosis or clubbing.  Strength is normal and symmetrical in all extremities.  There is no lateralizing weakness.  There are no sensory deficits.  The skin is warm and dry.  There is no rash.  EKG shows atrial paced rhythm with first degree AV block and native QRS showing bifascicular block.   Assessment / Plan: Continue same medication.  Recheck 4 months for followup office visit and get another EKG then.

## 2012-11-23 NOTE — Assessment & Plan Note (Signed)
The patient has not had any further TIA symptoms.  She remains on a baby aspirin daily.

## 2012-11-23 NOTE — Assessment & Plan Note (Signed)
We interrogated her pacemaker today because her PR interval is so long.  Her EKG shows atrial pacing with a atrial spike buried in the preceding T wave and the PR interval is 0.52.  Interrogation of the pacemaker showed that she has had atrial fibrillation only 2.5% of the time and she is atrial pacing 90% of the time and ventricular pacing only 8% of the time.  Pacemaker is functioning normally.  She appears to have a very long intrinsic AV delay of her conduction system.

## 2012-11-23 NOTE — Assessment & Plan Note (Signed)
Patient has a history of hypercholesterolemia and is on ezetimibe.  She's not having any side effects

## 2012-11-23 NOTE — Patient Instructions (Addendum)
Your physician recommends that you continue on your current medications as directed. Please refer to the Current Medication list given to you today.  Your physician wants you to follow-up in: 4 month ov/ekg  You will receive a reminder letter in the mail two months in advance. If you don't receive a letter, please call our office to schedule the follow-up appointment.  

## 2012-12-21 ENCOUNTER — Ambulatory Visit (INDEPENDENT_AMBULATORY_CARE_PROVIDER_SITE_OTHER): Payer: Medicare Other | Admitting: *Deleted

## 2012-12-21 DIAGNOSIS — I4891 Unspecified atrial fibrillation: Secondary | ICD-10-CM

## 2012-12-21 LAB — POCT INR: INR: 2.5

## 2012-12-27 ENCOUNTER — Other Ambulatory Visit: Payer: Self-pay | Admitting: *Deleted

## 2012-12-27 MED ORDER — FUROSEMIDE 20 MG PO TABS: 20.0000 mg | ORAL_TABLET | Freq: Every day | ORAL | Status: DC

## 2013-01-06 ENCOUNTER — Other Ambulatory Visit: Payer: Self-pay | Admitting: *Deleted

## 2013-01-06 MED ORDER — METOPROLOL SUCCINATE ER 100 MG PO TB24: 100.0000 mg | ORAL_TABLET | Freq: Every day | ORAL | Status: DC

## 2013-01-12 ENCOUNTER — Ambulatory Visit (INDEPENDENT_AMBULATORY_CARE_PROVIDER_SITE_OTHER): Payer: Medicare Other | Admitting: Gynecology

## 2013-01-12 ENCOUNTER — Other Ambulatory Visit (HOSPITAL_COMMUNITY)
Admission: RE | Admit: 2013-01-12 | Discharge: 2013-01-12 | Disposition: A | Discharge status: Home / Self Care | Payer: Medicare Other | Source: Ambulatory Visit | Attending: Gynecology | Admitting: Gynecology

## 2013-01-12 ENCOUNTER — Encounter: Payer: Self-pay | Admitting: Gynecology

## 2013-01-12 VITALS — BP 130/76 | Ht 66.0 in | Wt 206.0 lb

## 2013-01-12 DIAGNOSIS — C55 Malignant neoplasm of uterus, part unspecified: Secondary | ICD-10-CM

## 2013-01-12 DIAGNOSIS — Z780 Asymptomatic menopausal state: Secondary | ICD-10-CM

## 2013-01-12 DIAGNOSIS — N952 Postmenopausal atrophic vaginitis: Secondary | ICD-10-CM

## 2013-01-12 DIAGNOSIS — C50919 Malignant neoplasm of unspecified site of unspecified female breast: Secondary | ICD-10-CM

## 2013-01-12 DIAGNOSIS — Z124 Encounter for screening for malignant neoplasm of cervix: Secondary | ICD-10-CM | POA: Insufficient documentation

## 2013-01-12 NOTE — Patient Instructions (Signed)
Followup for bone density as scheduled. Otherwise followup in 1 year.

## 2013-01-12 NOTE — Progress Notes (Signed)
Yolanda Spears 20-Dec-1934 [id number]        78 y.o.  G2P2002 for followup exam.  Several issues noted below.  Past medical history,surgical history, medications, allergies, family history and social history were all reviewed and documented in the EPIC chart. ROS:  Was performed and pertinent positives and negatives are included in the history.  Exam: Kim assistant Filed Vitals:   01/12/13 1405  BP: 130/76  Height: 5\' 6"  (1.676 m)  Weight: 206 lb (93.441 kg)   General appearance  Normal Skin grossly normal Head/Neck normal with no cervical or supraclavicular adenopathy thyroid normal Lungs  clear Cardiac RR, without RMG Abdominal  soft, nontender, without masses, organomegaly or hernia Bilateral Chest wall exam and without masses, tenderness or adenopathy Pelvic  Ext/BUS/vagina  normal with atrophic changes. Pap of cuff done   Adnexa  Without masses or tenderness    Anus and perineum  normal   Rectovaginal  normal sphincter tone without palpated masses or tenderness.    Assessment/Plan:  78 y.o. [id number] female for followup exam.   1. Stage III C endometrial carcinoma in 2006 status post radiation therapy and TAH/BSO. She'd been followed by Dr. Laurette Schimke and was released for routine annual follow up. Exam today NED. 2. Atrophic genital changes. Patient is asymptomatic without significant symptoms. We'll continue to monitor. 3. Pap smear 2013. Pap done today due to history of endometrial cancer. 4. Bilateral mastectomies. No need for mammography. Exam is NED. 5. Colonoscopy 2011. Followup at their recommended schedule. 6. DEXA 4 years ago. Unsure of results. Not on any medication. Recommend baseline DEXA now. Increase calcium vitamin D reviewed. 7. Health maintenance. Has lab work done through her primary physician's office who she is seeing next week. Followup one year, sooner as needed.     Dara Lords MD, 2:50 PM 01/12/2013

## 2013-01-12 NOTE — Addendum Note (Signed)
Addended by: Dayna Barker on: 01/12/2013 03:53 PM   Modules accepted: Orders

## 2013-01-17 ENCOUNTER — Ambulatory Visit (INDEPENDENT_AMBULATORY_CARE_PROVIDER_SITE_OTHER): Payer: Medicare Other | Admitting: Pharmacist

## 2013-01-17 DIAGNOSIS — I4891 Unspecified atrial fibrillation: Secondary | ICD-10-CM

## 2013-01-24 ENCOUNTER — Encounter: Payer: Self-pay | Admitting: Cardiology

## 2013-01-26 ENCOUNTER — Telehealth: Payer: Self-pay | Admitting: Cardiology

## 2013-01-26 NOTE — Telephone Encounter (Signed)
Patient is going to mail labs for July appointment

## 2013-01-26 NOTE — Telephone Encounter (Signed)
New Problem:    Patient would like to find out some information.  Please call back.

## 2013-02-01 ENCOUNTER — Other Ambulatory Visit: Payer: Self-pay | Admitting: *Deleted

## 2013-02-01 MED ORDER — PROPAFENONE HCL 225 MG PO TABS: 225.0000 mg | ORAL_TABLET | Freq: Three times a day (TID) | ORAL | Status: DC

## 2013-02-05 DIAGNOSIS — M8580 Other specified disorders of bone density and structure, unspecified site: Secondary | ICD-10-CM

## 2013-02-05 HISTORY — DX: Other specified disorders of bone density and structure, unspecified site: M85.80

## 2013-02-21 ENCOUNTER — Ambulatory Visit (INDEPENDENT_AMBULATORY_CARE_PROVIDER_SITE_OTHER): Payer: Medicare Other

## 2013-02-21 DIAGNOSIS — C55 Malignant neoplasm of uterus, part unspecified: Secondary | ICD-10-CM

## 2013-02-21 DIAGNOSIS — M899 Disorder of bone, unspecified: Secondary | ICD-10-CM

## 2013-02-21 DIAGNOSIS — M8580 Other specified disorders of bone density and structure, unspecified site: Secondary | ICD-10-CM

## 2013-02-28 ENCOUNTER — Encounter: Payer: Self-pay | Admitting: Gynecology

## 2013-02-28 ENCOUNTER — Ambulatory Visit (INDEPENDENT_AMBULATORY_CARE_PROVIDER_SITE_OTHER): Payer: Medicare Other | Admitting: *Deleted

## 2013-02-28 ENCOUNTER — Other Ambulatory Visit: Payer: Self-pay | Admitting: Cardiology

## 2013-02-28 DIAGNOSIS — I4891 Unspecified atrial fibrillation: Secondary | ICD-10-CM

## 2013-03-07 ENCOUNTER — Telehealth: Payer: Self-pay | Admitting: Neurology

## 2013-03-07 NOTE — Telephone Encounter (Signed)
Patient has questions about Metanx. Says her podiatrist gave her a pamphlet on it yesterday, and she would like to know from a neurological standpoint if this med could benefit her.

## 2013-03-13 ENCOUNTER — Telehealth: Payer: Self-pay | Admitting: *Deleted

## 2013-03-13 NOTE — Telephone Encounter (Signed)
We should have mailed her the result form. Regardless it showed osteopenia but the FRAX did not indicate that she needed to be treated at this time. Recommendation is to repeat the bone density in 2 years. I think she should be able to pull up the report on her My Chart. If all else fails she can come by and pick up a copy or we can mail a copy to her.

## 2013-03-13 NOTE — Telephone Encounter (Signed)
Pt called requesting recent dexa results. Please advise

## 2013-03-13 NOTE — Telephone Encounter (Signed)
Pt informed with the below note. 

## 2013-03-14 ENCOUNTER — Ambulatory Visit: Payer: Medicare Other | Admitting: Cardiology

## 2013-03-16 ENCOUNTER — Other Ambulatory Visit: Payer: Self-pay | Admitting: *Deleted

## 2013-03-16 MED ORDER — EZETIMIBE 10 MG PO TABS: 5.0000 mg | ORAL_TABLET | Freq: Every day | ORAL | Status: DC

## 2013-03-21 ENCOUNTER — Encounter: Payer: Self-pay | Admitting: Internal Medicine

## 2013-03-21 ENCOUNTER — Ambulatory Visit (INDEPENDENT_AMBULATORY_CARE_PROVIDER_SITE_OTHER): Payer: Medicare Other | Admitting: Internal Medicine

## 2013-03-21 VITALS — BP 148/78 | HR 80 | Ht 67.0 in | Wt 207.0 lb

## 2013-03-21 DIAGNOSIS — I495 Sick sinus syndrome: Secondary | ICD-10-CM

## 2013-03-21 DIAGNOSIS — I440 Atrioventricular block, first degree: Secondary | ICD-10-CM

## 2013-03-21 DIAGNOSIS — I4891 Unspecified atrial fibrillation: Secondary | ICD-10-CM

## 2013-03-21 DIAGNOSIS — Z950 Presence of cardiac pacemaker: Secondary | ICD-10-CM

## 2013-03-21 LAB — PACEMAKER DEVICE OBSERVATION
AL AMPLITUDE: 2 mv
AL IMPEDENCE PM: 537 Ohm
AL THRESHOLD: 0.5 v
ATRIAL PACING PM: 96
BAMS-0001: 150 {beats}/min
BATTERY VOLTAGE: 2.78 v
RV LEAD AMPLITUDE: 11.2 mv
RV LEAD IMPEDENCE PM: 578 Ohm
RV LEAD THRESHOLD: 0.75 v
VENTRICULAR PACING PM: 14

## 2013-03-21 NOTE — Patient Instructions (Addendum)
Remote monitoring is used to monitor your Pacemaker of ICD from home. This monitoring reduces the number of office visits required to check your device to one time per year. It allows [place] to keep an eye on the functioning of your device to ensure it is working properly. You are scheduled for a device check from home on 06/26/13. You may send your transmission at any time that day. If you have a wireless device, the transmission will be sent automatically. After your physician reviews your transmission, you will receive a postcard with your next transmission date.  Your physician wants you to follow-up in: 1 year with Dr. Graciela Husbands. You will receive a reminder letter in the mail two months in advance. If you don't receive a letter, please call our office to schedule the follow-up appointment.  Your physician recommends that you continue on your current medications as directed. Please refer to the Current Medication list given to you today.

## 2013-03-21 NOTE — Progress Notes (Signed)
Patient Care Team: Thayer Headings, MD as PCP - General   HPI  Yolanda Spears is a 78 y.o. female seen in followup for tachybradycardia syndrome with paroxysmal atrial fibrillation. She has been treated with Rythmol metoprolol and warfarin. She had a spell in August where she had transient difficulty speaking . It cleared spontaneously. Aspirin was added  She is status post pacemaker implantation 2007 for tachybradycardia syndrome and underwent generator replacement 7/13  Her Coumadin/INR stability remains poor  As does her balance     Past Medical History  Diagnosis Date  . Pacemaker -Medtronic   . Hypertension   . Peripheral neuropathy   . High cholesterol   . Heart murmur   . Shortness of breath on exertion   . Stroke     "small stroke & several TIA's"  . Atrial fibrillation   . History of radiation therapy   . Partial bowel obstruction JAN/2013  . Colon polyp 2011  . Endometrial cancer 2006    Stage IIIc  s/p radiation  . Breast cancer 1999    LOBULAR CARCINOMA IN SITU...  . Osteopenia 02/2013    T score -1.4 FRAX 11%/2.1%    Past Surgical History  Procedure Laterality Date  . Lymph node resection  2007  . Cholecystectomy  1990's  . Insert / replace / remove pacemaker  2007    initial placement  . Mastectomy  1999    bilaterally w/lymph node dissection  . Abdominal hysterectomy  2006    BSO  . Oophorectomy      BSO  . Exploratory laparotomy  2007    remove abdominal cyst    Current Outpatient Prescriptions  Medication Sig Dispense Refill  . aspirin 81 MG tablet Take 81 mg by mouth daily.      . calcium carbonate (TUMS - DOSED IN MG ELEMENTAL CALCIUM) 500 MG chewable tablet Chew 1 tablet by mouth daily.      . Cholecalciferol (VITAMIN D3) 5000 UNITS CAPS Take 1 capsule by mouth every other day.       . ezetimibe (ZETIA) 10 MG tablet Take 0.5 tablets (5 mg total) by mouth daily.  30 tablet  5  . furosemide (LASIX) 20 MG tablet Take 1 tablet (20 mg total) by  mouth daily.  30 tablet  5  . hydrALAZINE (APRESOLINE) 25 MG tablet TAKE 1 TABLET BY MOUTH TWICE A DAY  60 tablet  10  . lisinopril (PRINIVIL,ZESTRIL) 10 MG tablet TAKE 1 TABLET BY MOUTH TWICE A DAY  60 tablet  11  . metoprolol succinate (TOPROL-XL) 100 MG 24 hr tablet Take 1 tablet (100 mg total) by mouth daily. Take with or immediately following a meal.  90 tablet  3  . pregabalin (LYRICA) 75 MG capsule Take 75 mg by mouth 2 (two) times daily.        . propafenone (RYTHMOL) 225 MG tablet Take 1 tablet (225 mg total) by mouth every 8 (eight) hours.  100 tablet  3  . warfarin (COUMADIN) 2.5 MG tablet Take as directed by coumadin clinic  40 tablet  3   No current facility-administered medications for this visit.    Allergies  Allergen Reactions  . Clindamycin/Lincomycin     heartburn  . Other     Contrast dye   . Penicillins   . Shellfish-Derived Products   . Statins     Myalgias,elevated LFT studies  . Iohexol Hives and Itching    Itching in eyes, one hive on  face.    Review of Systems negative except from HPI and PMH  Physical Exam BP 148/78  Pulse 80  Ht 5\' 7"  (1.702 m)  Wt 207 lb (93.895 kg)  BMI 32.41 kg/m2 Well developed and well nourished in no acute distress HENT normal E scleral and icterus clear Neck Supple JVP flat; carotids brisk and full Clear to ausculation Device pocket well healed; without hematoma or erythema.  There is no tethering Regular rate and rhythm, no murmurs gallops or rub Soft with active bowel sounds No clubbing cyanosis Trace and non-pitting Edema Alert and oriented, grossly normal motor and sensory function Skin Warm and Dry  eCG demonstrates atrial paced  With an AV interval of about 600 ms  Assessment and  Plan

## 2013-03-29 ENCOUNTER — Encounter: Payer: Self-pay | Admitting: Cardiology

## 2013-03-29 ENCOUNTER — Ambulatory Visit (INDEPENDENT_AMBULATORY_CARE_PROVIDER_SITE_OTHER): Payer: Medicare Other | Admitting: Cardiology

## 2013-03-29 ENCOUNTER — Encounter: Payer: Self-pay | Admitting: Internal Medicine

## 2013-03-29 VITALS — BP 148/74 | HR 73 | Ht 67.0 in | Wt 205.0 lb

## 2013-03-29 DIAGNOSIS — I4891 Unspecified atrial fibrillation: Secondary | ICD-10-CM

## 2013-03-29 DIAGNOSIS — I480 Paroxysmal atrial fibrillation: Secondary | ICD-10-CM

## 2013-03-29 DIAGNOSIS — G459 Transient cerebral ischemic attack, unspecified: Secondary | ICD-10-CM

## 2013-03-29 DIAGNOSIS — I119 Hypertensive heart disease without heart failure: Secondary | ICD-10-CM

## 2013-03-29 DIAGNOSIS — G4730 Sleep apnea, unspecified: Secondary | ICD-10-CM

## 2013-03-29 NOTE — Assessment & Plan Note (Signed)
The patient wakes up frequently during the night.  She stays tired the next day.  Years ago she was evaluated and found to have sleep apnea but was unable to tolerate the CPAP machine.  We discussed possible referral to a sleep center to update her diagnosis and consider new machines which might be more tolerable.  She wants to think about it and we will discuss again next visit.  In the meantime I did recommended she lose weight since this might help the sleep apnea problem also.

## 2013-03-29 NOTE — Assessment & Plan Note (Signed)
The patient had any TIA a year ago while on Coumadin.  She has had no recurrent TIAs since baby aspirin was added to her regimen.

## 2013-03-29 NOTE — Assessment & Plan Note (Signed)
On anticoagulation;  recommmendations with TIA while on coumadin is to increase INR target 2.5-3.5 and NOT add ASA Will defer to PCP and primary cardiology

## 2013-03-29 NOTE — Patient Instructions (Addendum)
Work harder on [employer] loss  Your physician recommends that you continue on your current medications as directed. Please refer to the Current Medication list given to you today.  Your physician wants you to follow-up in: 4 month ov You will receive a reminder letter in the mail two months in advance. If you don't receive a letter, please call our office to schedule the follow-up appointment.

## 2013-03-29 NOTE — Assessment & Plan Note (Signed)
The patient has not been expressing any chest pain or increased shortness of breath.  No dizzy spells or syncope.

## 2013-03-29 NOTE — Assessment & Plan Note (Signed)
Stable post pacing 

## 2013-03-29 NOTE — Progress Notes (Signed)
Yolanda Spears Date of Birth:  1935-03-14 [facility] [address] [place], [place]  [address] [phone]         Fax   [phone]  History of Present Illness: This pleasant 78 year old woman is seen for a scheduled followup office visit. The patient has a past history of tachybradycardia syndrome and had a pacemaker implanted in 2007. She had a pacemaker generator change by Dr. Graciela Husbands on 03/23/12. She is on chronic Coumadin anticoagulation. The patient had what sounds like a possible TIA during the summer. She has had no recurrence. She is now on a baby aspirin as well as chronic Coumadin. The patient has not been having any lightheadedness or syncope. She has not been aware of any racing of her heart. No palpitations. He does have chronic positional vertigo when she changes positions or rolls over in bed. She has a history of peripheral neuropathy and sees Dr. Terrace Arabia who has her on Lyrica.  She sleeps poorly.  She has been told that she has sleep apnea.  She tried a CPAP machine years ago but was unable to tolerate it.   Current Outpatient Prescriptions  Medication Sig Dispense Refill  . aspirin 81 MG tablet Take 81 mg by mouth daily.      . calcium carbonate (TUMS - DOSED IN MG ELEMENTAL CALCIUM) 500 MG chewable tablet Chew 1 tablet by mouth daily.      . Cholecalciferol (VITAMIN D3) 5000 UNITS CAPS TAKE 1 TABLET 3 TIMES A WEEK      . ezetimibe (ZETIA) 10 MG tablet Take 0.5 tablets (5 mg total) by mouth daily.  30 tablet  5  . furosemide (LASIX) 20 MG tablet Take 1 tablet (20 mg total) by mouth daily.  30 tablet  5  . hydrALAZINE (APRESOLINE) 25 MG tablet TAKE 1 TABLET BY MOUTH TWICE A DAY  60 tablet  10  . lisinopril (PRINIVIL,ZESTRIL) 10 MG tablet TAKE 1 TABLET BY MOUTH TWICE A DAY  60 tablet  11  . metoprolol succinate (TOPROL-XL) 100 MG 24 hr tablet Take 1 tablet (100 mg total) by mouth daily. Take with or immediately following a meal.  90 tablet  3  . pregabalin  (LYRICA) 75 MG capsule Take 75 mg by mouth 2 (two) times daily.        . propafenone (RYTHMOL) 225 MG tablet Take 1 tablet (225 mg total) by mouth every 8 (eight) hours.  100 tablet  3  . warfarin (COUMADIN) 2.5 MG tablet Take as directed by coumadin clinic  40 tablet  3  . sulfamethoxazole-trimethoprim (BACTRIM,SEPTRA) 400-80 MG per tablet TAKE TWO TABLETS DAILY       No current facility-administered medications for this visit.    Allergies  Allergen Reactions  . Clindamycin/Lincomycin     heartburn  . Other     Contrast dye   . Penicillins   . Shellfish-Derived Products   . Statins     Myalgias,elevated LFT studies  . Iohexol Hives and Itching    Itching in eyes, one hive on face.    Patient Active Problem List   Diagnosis Date Noted  . Atrial fibrillation 11/24/2010    Priority: Medium  . History of adenomatous polyp of colon 04/07/2012  . Orthostatic lightheadedness 11/27/2011  . Partial small bowel obstruction 09/14/2011  . Vertigo 08/10/2011  . Hypercholesterolemia 08/10/2011  . TIA (transient ischemic attack) 04/28/2011  . Benign hypertensive heart disease without heart failure 01/09/2011  . First degree AV  block 11/26/2010  . Pacemaker- Dual-chamber-mdt 11/26/2010  . Right bundle branch block 11/26/2010    History  Smoking status  . Former Smoker -- 1.00 packs/day for 40 years  . Types: Cigarettes  . Quit date: 11/26/1998  Smokeless tobacco  . Never Used    History  Alcohol Use No    Comment: 09/14/11 "last drink ~ 08/31/2009"    Family History  Problem Relation Age of Onset  . Cancer Mother 64    UTERINE  . Breast cancer Mother 78  . Hypertension Mother   . Uterine cancer Mother   . Cancer Father     PROSTATE  . Hypertension Father     Review of Systems: Constitutional: no fever chills diaphoresis or fatigue or change in weight.  Head and neck: no hearing loss, no epistaxis, no photophobia or visual disturbance. Respiratory: No cough,  shortness of breath or wheezing. Cardiovascular: No chest pain peripheral edema, palpitations. Gastrointestinal: No abdominal distention, no abdominal pain, no change in bowel habits hematochezia or melena. Genitourinary: No dysuria, no frequency, no urgency, no nocturia. Musculoskeletal:No arthralgias, no back pain, no gait disturbance or myalgias. Neurological: No dizziness, no headaches, no numbness, no seizures, no syncope, no weakness, no tremors. Hematologic: No lymphadenopathy, no easy bruising. Psychiatric: No confusion, no hallucinations, no sleep disturbance.    Physical Exam: Filed Vitals:   03/29/13 1129  BP: 148/74  Pulse: 73   the general appearance reveals a well-developed well-nourished woman in no distress.The head and neck exam reveals pupils equal and reactive.  Extraocular movements are full.  There is no scleral icterus.  The mouth and pharynx are normal.  The neck is supple.  The carotids reveal no bruits.  The jugular venous pressure is normal.  The  thyroid is not enlarged.  There is no lymphadenopathy.  The chest is clear to percussion and auscultation.  There are no rales or rhonchi.  Expansion of the chest is symmetrical.  The precordium is quiet.  The first heart sound is normal.  The second heart sound is physiologically split.  There is no murmur gallop rub or click.  There is no abnormal lift or heave.  The abdomen is soft and nontender.  The bowel sounds are normal.  The liver and spleen are not enlarged.  There are no abdominal masses.  There are no abdominal bruits.  Extremities reveal good pedal pulses.  There is no phlebitis or edema.  There is no cyanosis or clubbing.  Strength is normal and symmetrical in all extremities.  There is no lateralizing weakness.  There are no sensory deficits.  The skin is warm and dry.  There is no rash.  EKG shows AV paced rhythm   Assessment / Plan: Continue same medication.  Lose weight.  Consider future referral to sleep  Center. Recheck in 4 months

## 2013-03-29 NOTE — Assessment & Plan Note (Signed)
The patient's device was interrogated.  The information was reviewed. No changes were made in the programming.    

## 2013-04-12 ENCOUNTER — Ambulatory Visit (INDEPENDENT_AMBULATORY_CARE_PROVIDER_SITE_OTHER): Payer: Medicare Other | Admitting: *Deleted

## 2013-04-12 DIAGNOSIS — I4891 Unspecified atrial fibrillation: Secondary | ICD-10-CM

## 2013-04-12 LAB — POCT INR: INR: 1.5

## 2013-04-24 ENCOUNTER — Other Ambulatory Visit: Payer: Self-pay | Admitting: *Deleted

## 2013-04-24 MED ORDER — LISINOPRIL 10 MG PO TABS: ORAL_TABLET | ORAL | Status: DC

## 2013-04-27 ENCOUNTER — Ambulatory Visit (INDEPENDENT_AMBULATORY_CARE_PROVIDER_SITE_OTHER): Payer: Medicare Other | Admitting: *Deleted

## 2013-04-27 DIAGNOSIS — I4891 Unspecified atrial fibrillation: Secondary | ICD-10-CM

## 2013-04-27 LAB — POCT INR: INR: 2.6

## 2013-04-30 ENCOUNTER — Other Ambulatory Visit: Payer: Self-pay | Admitting: Cardiology

## 2013-05-17 ENCOUNTER — Ambulatory Visit (INDEPENDENT_AMBULATORY_CARE_PROVIDER_SITE_OTHER): Payer: Medicare Other | Admitting: *Deleted

## 2013-05-17 DIAGNOSIS — I4891 Unspecified atrial fibrillation: Secondary | ICD-10-CM

## 2013-05-17 LAB — POCT INR: INR: 3.7

## 2013-05-18 ENCOUNTER — Encounter: Payer: Self-pay | Admitting: Neurology

## 2013-05-18 ENCOUNTER — Ambulatory Visit (INDEPENDENT_AMBULATORY_CARE_PROVIDER_SITE_OTHER): Payer: Medicare Other | Admitting: Neurology

## 2013-05-18 VITALS — Ht 68.0 in | Wt 221.0 lb

## 2013-05-18 DIAGNOSIS — G4730 Sleep apnea, unspecified: Secondary | ICD-10-CM

## 2013-05-18 DIAGNOSIS — R42 Dizziness and giddiness: Secondary | ICD-10-CM

## 2013-05-18 DIAGNOSIS — E7800 Pure hypercholesterolemia, unspecified: Secondary | ICD-10-CM

## 2013-05-18 DIAGNOSIS — I4891 Unspecified atrial fibrillation: Secondary | ICD-10-CM

## 2013-05-18 DIAGNOSIS — I119 Hypertensive heart disease without heart failure: Secondary | ICD-10-CM

## 2013-05-18 DIAGNOSIS — G459 Transient cerebral ischemic attack, unspecified: Secondary | ICD-10-CM

## 2013-05-18 NOTE — Progress Notes (Signed)
History of Present Illness:   Ms. Klunder is a 78 year-old Caucasian female with Atrial fibrillation and a pacemaker, peripheral neuropathy, came in to followup, last clinical visit was with  Eber Jones in March 78  She was admitted to hospital admission in July 2012. She presented with acute confusion for 45 minutes, described difficulty with word finding, dialing phone and uses her legs. She has extensive evaluation at hospital.  CT scan of the head without contrast performed  revealed atrophy with small-vessel chronic ischemic changes of deep cerebral white matter.  Tiny old lacunar infarct, right thalamus.  No acute intracranial abnormalities.   CTA of the head and neck revealed 30% stenosis of proximal left subclavian artery.  Atherosclerotic disease at both carotid bifurcations but without narrowing of the ICA lumen. The left vertebral artery is a large vessel, widely patent.  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  Enlargement of the thyroid  with multiple nodules likely represents multinodular goiter.  Atherosclerotic calcification in both carotid siphons with narrowing,  estimated at 50% bilaterally.  Stenosis at both supraclinoid ICA region 50-70% on the right and 30-50% on the left.    2D echo revealed the left ventricular cavity size was normal, systolic function was  normal, EF was 55-60%.  wall motion was normal.  She also has obstructive sleep apnea, has CPAP machine, but not using it since 2008, could no tolerate it.  She had a history of right onset bilateral lower extremity paresthesia, was diagnosed with peripheral neuropathy, she is taking lyrica, extensive evaluation in the past by outside neurologist, she reported no etiology was found, her neuropathy was confirmed by EMG nerve conduction study, she does not want to have a repeat evaluation again, .  She complains of bilateral feet burning numbing  sensation, especially at nighttime, and when she first got up in the morning time, difficulty bearing weight.  Review of Systems  Out of a complete 14 system review, the patient complains of only the following symptoms, and all other reviewed systems are negative.  Easy bruising, sleepiness, snoring,   Social History Patient is retired from [employer]. 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  Patient is a widow. Patient was married for 38 years and has 2 children. Patient does drink caffeine, 1 cup daily. Patient quit smoking in 2001, and drinks alcohol on holidays. Patient denies any history of illicit drug use. Right handed Inhaled Tobacco Use: former smoker  Family History Patient's parents are both deceased. Cause of father's death -- heart problems and cancer, 76 yo. Cause of mother's death heart and lung problems -- 76 yo.   Past Medical History High blood pressure,Heart disease, High cholesterol Stroke, cancer bowel obstruction  Surgical History Gall bladder Double mastectomy Hysterectomy Cyst Pacemaker   Physical Exam  General: Patient is awake, alert, and in no acute distress. Well developed and groomed. Head: Normocephalic and atraumatic Neck: Neck is supple, no carotid bruits. Respiratory: LCA Cardiovascular: Heart is regular rate and rhythm.Systolic murmur heard best in 2nd R ICS, no significant radiation.  Neurologic Exam  Mental Status: Awake, alert, pleasant, and cooperative. Attention span, concentration, and fund of knowledge are normal.  Language is fluent and comprehension intact. Cranial Nerves: Funduscopic exam notably limited by patient's pupil size and hippus. Pupils are equal, round, and reactive to light. Visual fields are full to confrontation. Conjugate eye movements are  full and symmetric.  Facial sensation and strength are symmetric.  Hearing is intact.  Palate elevated symmetrically and uvula is midline.  Shoulder  shug is symmetric.  Tongue is midline. Motor: Normal bulk and tone. Full strength in the upper and lower extremities. No pronator drift. Sensory: Sensation in upper extremities is intact biaterally to soft touch, pinprick, temperature, vibration, and proprioception; she has decreased light touch, pinprick to mid shin level, absent vibratory sensation to knee level, mild decreased toe proprioception,  Coordination: No ataxia or dysmetria on finger-nose or rapid alternating movement testing. Gait and Station: push up from seated position, narrow based gait, she has difficulty walk on heels and toes,  Has difficulty performing tandem walking. Romberg signs were positive. Reflexes: Deep tendon reflexes in the upper and lower extremity are present and symmetric, patellar reflexes 2/2, ankle reflexes 0/0   Assessment and Plan:  78 year-old Caucasian female with a history of possible TIA and atrial fibrillation with permanent pacemaker,on warfarin with therapeutic INRs, peripheral neuropathy well controlled on Lyrica  1, Keep coumadin as stroke prevention. 2.  Moderate exercise encouraged  3. Lyrica for peripheral neuropathy, 4 return to clinic as needed.

## 2013-05-23 ENCOUNTER — Other Ambulatory Visit: Payer: Self-pay | Admitting: Neurology

## 2013-05-24 NOTE — Telephone Encounter (Signed)
Rx signed and faxed.

## 2013-05-25 ENCOUNTER — Encounter (INDEPENDENT_AMBULATORY_CARE_PROVIDER_SITE_OTHER): Payer: Medicare Other

## 2013-05-25 DIAGNOSIS — I6529 Occlusion and stenosis of unspecified carotid artery: Secondary | ICD-10-CM

## 2013-05-31 ENCOUNTER — Ambulatory Visit (INDEPENDENT_AMBULATORY_CARE_PROVIDER_SITE_OTHER): Payer: Medicare Other | Admitting: *Deleted

## 2013-05-31 DIAGNOSIS — I4891 Unspecified atrial fibrillation: Secondary | ICD-10-CM

## 2013-06-02 ENCOUNTER — Telehealth: Payer: Self-pay | Admitting: *Deleted

## 2013-06-02 NOTE — Telephone Encounter (Signed)
Message copied by Burnell Blanks on Fri Jun 02, 2013  2:25 PM ------      Message from: Cassell Clement      Created: Mon May 29, 2013  1:54 PM       Please report.  The carotid Dopplers show some progression of disease but still less than 60% and she does not need any carotid artery surgery at this point.  Continue risk reduction strategy. ------

## 2013-06-02 NOTE — Telephone Encounter (Signed)
Advised patient

## 2013-06-21 ENCOUNTER — Other Ambulatory Visit: Payer: Self-pay | Admitting: Cardiology

## 2013-06-23 ENCOUNTER — Ambulatory Visit (INDEPENDENT_AMBULATORY_CARE_PROVIDER_SITE_OTHER): Payer: Medicare Other | Admitting: *Deleted

## 2013-06-23 DIAGNOSIS — I4891 Unspecified atrial fibrillation: Secondary | ICD-10-CM

## 2013-06-23 LAB — POCT INR: INR: 2.8

## 2013-06-26 ENCOUNTER — Ambulatory Visit (INDEPENDENT_AMBULATORY_CARE_PROVIDER_SITE_OTHER): Payer: Medicare Other | Admitting: *Deleted

## 2013-06-26 ENCOUNTER — Encounter: Payer: Self-pay | Admitting: Internal Medicine

## 2013-06-26 DIAGNOSIS — Z950 Presence of cardiac pacemaker: Secondary | ICD-10-CM

## 2013-06-26 DIAGNOSIS — I495 Sick sinus syndrome: Secondary | ICD-10-CM

## 2013-07-05 LAB — REMOTE PACEMAKER DEVICE
AL IMPEDENCE PM: 529 Ohm
AL THRESHOLD: 0.75 V
ATRIAL PACING PM: 94
RV LEAD IMPEDENCE PM: 591 Ohm
VENTRICULAR PACING PM: 99

## 2013-07-14 ENCOUNTER — Encounter: Payer: Self-pay | Admitting: *Deleted

## 2013-07-24 ENCOUNTER — Ambulatory Visit (INDEPENDENT_AMBULATORY_CARE_PROVIDER_SITE_OTHER): Payer: Medicare Other | Admitting: Pharmacist

## 2013-07-24 DIAGNOSIS — I4891 Unspecified atrial fibrillation: Secondary | ICD-10-CM

## 2013-07-24 LAB — POCT INR: INR: 2.7

## 2013-07-31 ENCOUNTER — Ambulatory Visit (INDEPENDENT_AMBULATORY_CARE_PROVIDER_SITE_OTHER): Payer: Medicare Other | Admitting: Cardiology

## 2013-07-31 ENCOUNTER — Encounter: Payer: Self-pay | Admitting: Cardiology

## 2013-07-31 VITALS — BP 130/76 | HR 70 | Ht 67.0 in | Wt 206.0 lb

## 2013-07-31 DIAGNOSIS — I119 Hypertensive heart disease without heart failure: Secondary | ICD-10-CM

## 2013-07-31 DIAGNOSIS — I495 Sick sinus syndrome: Secondary | ICD-10-CM

## 2013-07-31 DIAGNOSIS — E7800 Pure hypercholesterolemia, unspecified: Secondary | ICD-10-CM

## 2013-07-31 MED ORDER — FUROSEMIDE 20 MG PO TABS: ORAL_TABLET | ORAL | Status: DC

## 2013-07-31 MED ORDER — PROPAFENONE HCL 225 MG PO TABS: 225.0000 mg | ORAL_TABLET | Freq: Three times a day (TID) | ORAL | Status: DC

## 2013-07-31 NOTE — Patient Instructions (Signed)
Your physician recommends that you continue on your current medications as directed. Please refer to the Current Medication list given to you today.  Your physician recommends that you schedule a follow-up appointment in: 4 MONTH OV/EKG  

## 2013-07-31 NOTE — Progress Notes (Signed)
Yolanda Spears Date of Birth:  Nov 07, 1934 [address] [place], [place]  [address] [phone]         Fax   [phone]  History of Present Illness: This pleasant 78 year old woman is seen for a scheduled followup office visit. The patient has a past history of tachybradycardia syndrome and had a pacemaker implanted in 2007. She had a pacemaker generator change by Dr. Graciela Husbands on 03/23/12. She is on chronic Coumadin anticoagulation. The patient had what sounds like a possible TIA during the summer. She has had no recurrence. She is now on a baby aspirin as well as chronic Coumadin. The patient has not been having any lightheadedness or syncope. She has not been aware of any racing of her heart. No palpitations. He does have chronic positional vertigo when she changes positions or rolls over in bed. She has a history of peripheral neuropathy and sees Dr. Terrace Arabia who has her on Lyrica.  She sleeps poorly.  She has been told that she has sleep apnea.  She tried a CPAP machine years ago but was unable to tolerate it.  She has issues with her balance and is considering hiring a Systems analyst to come to her home to help her.  She does not drive.   Current Outpatient Prescriptions  Medication Sig Dispense Refill  . aspirin 81 MG tablet Take 81 mg by mouth daily.      . calcium carbonate (TUMS - DOSED IN MG ELEMENTAL CALCIUM) 500 MG chewable tablet Chew 1 tablet by mouth daily.      . Cholecalciferol (VITAMIN D3) 5000 UNITS CAPS TAKE 1 TABLET 3 TIMES A WEEK      . ezetimibe (ZETIA) 10 MG tablet Take 0.5 tablets (5 mg total) by mouth daily.  30 tablet  5  . furosemide (LASIX) 20 MG tablet TAKE 1 TABLET BY MOUTH EVERY DAY  90 tablet  3  . hydrALAZINE (APRESOLINE) 25 MG tablet TAKE 1 TABLET BY MOUTH TWICE A DAY  60 tablet  10  . L-Methylfolate-B6-B12 (METANX PO) Take by mouth daily.      Marland Kitchen lisinopril (PRINIVIL,ZESTRIL) 10 MG tablet TAKE 1 TABLET BY MOUTH TWICE A DAY  60 tablet  3  . LYRICA  75 MG capsule TAKE ONE CAPSULE TWICE A DAY  60 capsule  5  . metoprolol succinate (TOPROL-XL) 100 MG 24 hr tablet Take 1 tablet (100 mg total) by mouth daily. Take with or immediately following a meal.  90 tablet  3  . propafenone (RYTHMOL) 225 MG tablet Take 1 tablet (225 mg total) by mouth every 8 (eight) hours.  270 tablet  3  . warfarin (COUMADIN) 2.5 MG tablet TAKE AS DIRECTED BY COUMADIN CLINIC  40 tablet  3   No current facility-administered medications for this visit.    Allergies  Allergen Reactions  . Clindamycin/Lincomycin     heartburn  . Other     Contrast dye   . Penicillins   . Shellfish-Derived Products   . Statins     Myalgias,elevated LFT studies  . Iohexol Hives and Itching    Itching in eyes, one hive on face.    Patient Active Problem List   Diagnosis Date Noted  . Atrial fibrillation 11/24/2010    Priority: Medium  . Sleep apnea 03/29/2013  . History of adenomatous polyp of colon 04/07/2012  . Orthostatic lightheadedness 11/27/2011  . Partial small bowel obstruction 09/14/2011  . Vertigo 08/10/2011  . Hypercholesterolemia 08/10/2011  .  TIA (transient ischemic attack) 04/28/2011  . Benign hypertensive heart disease without heart failure 01/09/2011  . First degree AV block 11/26/2010  . Pacemaker- Dual-chamber-mdt 11/26/2010  . Tachycardia-bradycardia syndrome 11/26/2010    History  Smoking status  . Former Smoker -- 1.00 packs/day for 40 years  . Types: Cigarettes  . Quit date: 11/26/1998  Smokeless tobacco  . Never Used    History  Alcohol Use No    Comment: 09/14/11 "last drink ~ 08/31/2009"    Family History  Problem Relation Age of Onset  . Cancer Mother 61    UTERINE  . Breast cancer Mother 66  . Hypertension Mother   . Uterine cancer Mother   . Cancer Father     PROSTATE  . Hypertension Father     Review of Systems: Constitutional: no fever chills diaphoresis or fatigue or change in weight.  Head and neck: no hearing loss,  no epistaxis, no photophobia or visual disturbance. Respiratory: No cough, shortness of breath or wheezing. Cardiovascular: No chest pain peripheral edema, palpitations. Gastrointestinal: No abdominal distention, no abdominal pain, no change in bowel habits hematochezia or melena. Genitourinary: No dysuria, no frequency, no urgency, no nocturia. Musculoskeletal:No arthralgias, no back pain, no gait disturbance or myalgias. Neurological: No dizziness, no headaches, no numbness, no seizures, no syncope, no weakness, no tremors. Hematologic: No lymphadenopathy, no easy bruising. Psychiatric: No confusion, no hallucinations, no sleep disturbance.    Physical Exam: Filed Vitals:   07/31/13 1522  BP: 130/76  Pulse: 70   the general appearance reveals a well-developed well-nourished woman in no distress.The head and neck exam reveals pupils equal and reactive.  Extraocular movements are full.  There is no scleral icterus.  The mouth and pharynx are normal.  The neck is supple.  The carotids reveal no bruits.  The jugular venous pressure is normal.  The  thyroid is not enlarged.  There is no lymphadenopathy.  The chest is clear to percussion and auscultation.  There are no rales or rhonchi.  Expansion of the chest is symmetrical.  The precordium is quiet.  The first heart sound is normal.  The second heart sound is physiologically split.  There is no murmur gallop rub or click.  There is no abnormal lift or heave.  The abdomen is soft and nontender.  The bowel sounds are normal.  The liver and spleen are not enlarged.  There are no abdominal masses.  There are no abdominal bruits.  Extremities reveal good pedal pulses.  There is no phlebitis or edema.  There is no cyanosis or clubbing.  Strength is normal and symmetrical in all extremities.  There is no lateralizing weakness.  There are no sensory deficits.  The skin is warm and dry.  There is no rash.     Assessment / Plan: Continue same medication.    Recheck in 4 months for office visit and EKG

## 2013-07-31 NOTE — Assessment & Plan Note (Signed)
The patient has hypercholesterolemia.  This is followed by her PCP.  She is on ezetimibe and is not having any side effects

## 2013-07-31 NOTE — Assessment & Plan Note (Signed)
Blood pressure was remaining stable on current medication.  No dizzy spells or syncope.  No symptoms of CHF.

## 2013-07-31 NOTE — Assessment & Plan Note (Signed)
She is on propafenone.  She has not been aware of any racing of her heart.

## 2013-08-21 ENCOUNTER — Ambulatory Visit (INDEPENDENT_AMBULATORY_CARE_PROVIDER_SITE_OTHER): Payer: Medicare Other | Admitting: Pharmacist

## 2013-08-21 DIAGNOSIS — I4891 Unspecified atrial fibrillation: Secondary | ICD-10-CM

## 2013-09-08 ENCOUNTER — Ambulatory Visit (INDEPENDENT_AMBULATORY_CARE_PROVIDER_SITE_OTHER): Payer: Medicare Other | Admitting: Pharmacist

## 2013-09-08 DIAGNOSIS — I4891 Unspecified atrial fibrillation: Secondary | ICD-10-CM

## 2013-09-08 LAB — POCT INR: INR: 4

## 2013-09-10 ENCOUNTER — Emergency Department (HOSPITAL_COMMUNITY): Payer: Medicare Other

## 2013-09-10 ENCOUNTER — Inpatient Hospital Stay (HOSPITAL_COMMUNITY)
Admission: EM | Admit: 2013-09-10 | Discharge: 2013-09-13 | DRG: 390 | Disposition: A | Discharge status: Home / Self Care | Payer: Medicare Other | Attending: Internal Medicine | Admitting: Internal Medicine

## 2013-09-10 ENCOUNTER — Encounter (HOSPITAL_COMMUNITY): Payer: Self-pay | Admitting: Emergency Medicine

## 2013-09-10 DIAGNOSIS — I440 Atrioventricular block, first degree: Secondary | ICD-10-CM

## 2013-09-10 DIAGNOSIS — R42 Dizziness and giddiness: Secondary | ICD-10-CM

## 2013-09-10 DIAGNOSIS — K56609 Unspecified intestinal obstruction, unspecified as to partial versus complete obstruction: Principal | ICD-10-CM

## 2013-09-10 DIAGNOSIS — I119 Hypertensive heart disease without heart failure: Secondary | ICD-10-CM

## 2013-09-10 DIAGNOSIS — Z87891 Personal history of nicotine dependence: Secondary | ICD-10-CM

## 2013-09-10 DIAGNOSIS — Z7901 Long term (current) use of anticoagulants: Secondary | ICD-10-CM

## 2013-09-10 DIAGNOSIS — I4891 Unspecified atrial fibrillation: Secondary | ICD-10-CM

## 2013-09-10 DIAGNOSIS — Z950 Presence of cardiac pacemaker: Secondary | ICD-10-CM

## 2013-09-10 DIAGNOSIS — Z7982 Long term (current) use of aspirin: Secondary | ICD-10-CM

## 2013-09-10 DIAGNOSIS — I495 Sick sinus syndrome: Secondary | ICD-10-CM

## 2013-09-10 DIAGNOSIS — Z8673 Personal history of transient ischemic attack (TIA), and cerebral infarction without residual deficits: Secondary | ICD-10-CM

## 2013-09-10 DIAGNOSIS — G4730 Sleep apnea, unspecified: Secondary | ICD-10-CM

## 2013-09-10 DIAGNOSIS — K56600 Partial intestinal obstruction, unspecified as to cause: Secondary | ICD-10-CM

## 2013-09-10 DIAGNOSIS — Z79899 Other long term (current) drug therapy: Secondary | ICD-10-CM

## 2013-09-10 DIAGNOSIS — Z923 Personal history of irradiation: Secondary | ICD-10-CM

## 2013-09-10 DIAGNOSIS — I10 Essential (primary) hypertension: Secondary | ICD-10-CM

## 2013-09-10 DIAGNOSIS — E7800 Pure hypercholesterolemia, unspecified: Secondary | ICD-10-CM

## 2013-09-10 DIAGNOSIS — Z8542 Personal history of malignant neoplasm of other parts of uterus: Secondary | ICD-10-CM

## 2013-09-10 DIAGNOSIS — G459 Transient cerebral ischemic attack, unspecified: Secondary | ICD-10-CM

## 2013-09-10 DIAGNOSIS — Z860101 Personal history of adenomatous and serrated colon polyps: Secondary | ICD-10-CM

## 2013-09-10 DIAGNOSIS — Z853 Personal history of malignant neoplasm of breast: Secondary | ICD-10-CM

## 2013-09-10 DIAGNOSIS — G609 Hereditary and idiopathic neuropathy, unspecified: Secondary | ICD-10-CM | POA: Diagnosis present

## 2013-09-10 DIAGNOSIS — Z86010 Personal history of colonic polyps: Secondary | ICD-10-CM

## 2013-09-10 LAB — COMPREHENSIVE METABOLIC PANEL
ALT: 18 U/L (ref 0–35)
AST: 22 U/L (ref 0–37)
Albumin: 3.3 g/dL — ABNORMAL LOW (ref 3.5–5.2)
Alkaline Phosphatase: 73 U/L (ref 39–117)
BUN: 14 mg/dL (ref 6–23)
CHLORIDE: 103 meq/L (ref 96–112)
CO2: 26 meq/L (ref 19–32)
Calcium: 9 mg/dL (ref 8.4–10.5)
Creatinine, Ser: 0.86 mg/dL (ref 0.50–1.10)
GFR, EST AFRICAN AMERICAN: 74 mL/min — AB (ref >90)
GFR, EST NON AFRICAN AMERICAN: 64 mL/min — AB (ref >90)
GLUCOSE: 119 mg/dL — AB (ref 70–99)
Potassium: 4.1 mEq/L (ref 3.7–5.3)
Sodium: 143 mEq/L (ref 137–147)
Total Bilirubin: 0.8 mg/dL (ref 0.3–1.2)
Total Protein: 7.3 g/dL (ref 6.0–8.3)

## 2013-09-10 LAB — PROTIME-INR
INR: 2.09 — AB (ref 0.00–1.49)
Prothrombin Time: 22.8 seconds — ABNORMAL HIGH (ref 11.6–15.2)

## 2013-09-10 LAB — CBC WITH DIFFERENTIAL/PLATELET
Basophils Absolute: 0 10*3/uL (ref 0.0–0.1)
Basophils Relative: 0 % (ref 0–1)
Eosinophils Absolute: 0 10*3/uL (ref 0.0–0.7)
Eosinophils Relative: 0 % (ref 0–5)
HCT: 39.3 % (ref 36.0–46.0)
HEMOGLOBIN: 13.2 g/dL (ref 12.0–15.0)
LYMPHS ABS: 0.8 10*3/uL (ref 0.7–4.0)
Lymphocytes Relative: 10 % — ABNORMAL LOW (ref 12–46)
MCH: 30.4 pg (ref 26.0–34.0)
MCHC: 33.6 g/dL (ref 30.0–36.0)
MCV: 90.6 fL (ref 78.0–100.0)
MONOS PCT: 9 % (ref 3–12)
Monocytes Absolute: 0.7 10*3/uL (ref 0.1–1.0)
NEUTROS ABS: 6.6 10*3/uL (ref 1.7–7.7)
Neutrophils Relative %: 81 % — ABNORMAL HIGH (ref 43–77)
Platelets: 220 10*3/uL (ref 150–400)
RBC: 4.34 MIL/uL (ref 3.87–5.11)
RDW: 15 % (ref 11.5–15.5)
WBC: 8.2 10*3/uL (ref 4.0–10.5)

## 2013-09-10 LAB — POCT I-STAT TROPONIN I: Troponin i, poc: 0.02 ng/mL (ref 0.00–0.08)

## 2013-09-10 LAB — LACTIC ACID, PLASMA: Lactic Acid, Venous: 1.1 mmol/L (ref 0.5–2.2)

## 2013-09-10 MED ORDER — MORPHINE SULFATE 4 MG/ML IJ SOLN
4.0000 mg | Freq: Once | INTRAMUSCULAR | Status: AC
  Administered 2013-09-11: 4 mg via INTRAVENOUS
  Filled 2013-09-10: qty 1

## 2013-09-10 MED ORDER — SODIUM CHLORIDE 0.9 % IV SOLN: INTRAVENOUS | Status: AC

## 2013-09-10 MED ORDER — SODIUM CHLORIDE 0.9 % IV SOLN
Freq: Once | INTRAVENOUS | Status: AC
  Administered 2013-09-10: 22:00:00 via INTRAVENOUS

## 2013-09-10 MED ORDER — LIDOCAINE VISCOUS 2 % MT SOLN
15.0000 mL | Freq: Once | OROMUCOSAL | Status: AC
  Administered 2013-09-11: 15 mL via OROMUCOSAL
  Filled 2013-09-10: qty 15

## 2013-09-10 MED ORDER — ONDANSETRON HCL 4 MG/2ML IJ SOLN
4.0000 mg | Freq: Once | INTRAMUSCULAR | Status: AC
  Administered 2013-09-10: 4 mg via INTRAVENOUS
  Filled 2013-09-10: qty 2

## 2013-09-10 NOTE — ED Notes (Signed)
Per EMS: Pt reports constipation. Last BM Friday. Pt reports she has had several bowel obstructions in the past and think she may have one now. Pt also c/o nausea and vomiting. Ax4, NAD. V/S WNL. BP-134/84 HR-84 R-18.

## 2013-09-10 NOTE — ED Provider Notes (Signed)
CSN: [id number]     Arrival date & time 09/10/13  2052 History   First MD Initiated Contact with Patient 09/10/13 2124     Chief Complaint  Patient presents with  . Constipation   (Consider location/radiation/quality/duration/timing/severity/associated sxs/prior Treatment) Patient is a 79 y.o. female presenting with abdominal pain.  Abdominal Pain Pain location:  Generalized Pain quality: sharp   Pain radiates to:  Does not radiate Pain severity:  Moderate Onset quality:  Gradual Duration:  3 days Timing:  Intermittent Progression:  Waxing and waning Chronicity:  Recurrent Relieved by:  Nothing Associated symptoms: nausea and vomiting   Associated symptoms: no chest pain, no chills, no constipation, no cough, no diarrhea, no dysuria, no fever, no shortness of breath and no sore throat   Risk factors: being elderly and obesity     Past Medical History  Diagnosis Date  . Pacemaker -Medtronic   . Hypertension   . Peripheral neuropathy   . High cholesterol   . Heart murmur   . Shortness of breath on exertion   . Stroke     "small stroke & several TIA's"  . Atrial fibrillation   . History of radiation therapy   . Partial bowel obstruction JAN/2013  . Colon polyp 2011  . Endometrial cancer 2006    Stage IIIc  s/p radiation  . Breast cancer 1999    LOBULAR CARCINOMA IN SITU...  . Osteopenia 02/2013    T score -1.4 FRAX 11%/2.1%   Past Surgical History  Procedure Laterality Date  . Lymph node resection  2007  . Cholecystectomy  1990's  . Insert / replace / remove pacemaker  2007    initial placement  . Mastectomy  1999    bilaterally w/lymph node dissection  . Abdominal hysterectomy  2006    BSO  . Oophorectomy      BSO  . Exploratory laparotomy  2007    remove abdominal cyst   Family History  Problem Relation Age of Onset  . Cancer Mother 60    UTERINE  . Breast cancer Mother 7  . Hypertension Mother   . Uterine cancer Mother   . Cancer Father      PROSTATE  . Hypertension Father    History  Substance Use Topics  . Smoking status: Former Smoker -- 1.00 packs/day for 40 years    Types: Cigarettes    Quit date: 11/26/1998  . Smokeless tobacco: Never Used  . Alcohol Use: No     Comment: 09/14/11 "last drink ~ 08/31/2009"   OB History   Grav Para Term Preterm Abortions TAB SAB Ect Mult Living   2 2 2       2      Review of Systems  Constitutional: Negative for fever and chills.  HENT: Negative for sore throat.   Eyes: Negative for pain.  Respiratory: Negative for cough and shortness of breath.   Cardiovascular: Negative for chest pain.  Gastrointestinal: Positive for nausea, vomiting and abdominal pain. Negative for diarrhea and constipation.  Genitourinary: Negative for dysuria.  Musculoskeletal: Negative for back pain.  Skin: Negative for rash.  Neurological: Negative for numbness and headaches.    Allergies  Clindamycin/lincomycin; Other; Penicillins; Shellfish-derived products; Statins; and Iohexol  Home Medications   Current Outpatient Rx  Name  Route  Sig  Dispense  Refill  . aspirin 81 MG tablet   Oral   Take 81 mg by mouth daily.         . calcium carbonate (  TUMS - DOSED IN MG ELEMENTAL CALCIUM) 500 MG chewable tablet   Oral   Chew 1 tablet by mouth daily.         . Cholecalciferol (VITAMIN D-3) 5000 UNITS TABS   Oral   Take 1 tablet by mouth 4 (four) times a week. Takes on Saturday, Sunday, Tuesday and Thursday         . ezetimibe (ZETIA) 10 MG tablet   Oral   Take 0.5 tablets (5 mg total) by mouth daily.   30 tablet   5   . furosemide (LASIX) 20 MG tablet   Oral   Take 20 mg by mouth daily.         . hydrALAZINE (APRESOLINE) 25 MG tablet   Oral   Take 25 mg by mouth 2 (two) times daily.         Marland Kitchen L-Methylfolate-B6-B12 (METANX PO)   Oral   Take 1 tablet by mouth daily.          Marland Kitchen lisinopril (PRINIVIL,ZESTRIL) 10 MG tablet   Oral   Take 10 mg by mouth 2 (two) times daily.          . metoprolol succinate (TOPROL-XL) 100 MG 24 hr tablet   Oral   Take 1 tablet (100 mg total) by mouth daily. Take with or immediately following a meal.   90 tablet   3   . pregabalin (LYRICA) 75 MG capsule   Oral   Take 75 mg by mouth 2 (two) times daily.         . propafenone (RYTHMOL) 225 MG tablet   Oral   Take 1 tablet (225 mg total) by mouth every 8 (eight) hours.   270 tablet   3   . warfarin (COUMADIN) 2.5 MG tablet   Oral   Take 1.25-2.5 mg by mouth See admin instructions. Take 0.5 tablet on Monday and Friday. Take 1 tablet all other days.          BP 158/72  Pulse 73  Temp(Src) 98.2 F (36.8 C) (Oral)  Resp 16  SpO2 95% Physical Exam  Constitutional: She is oriented to person, place, and time. She appears well-developed and well-nourished. No distress.  HENT:  Head: Normocephalic and atraumatic.  Eyes: Pupils are equal, round, and reactive to light. Right eye exhibits no discharge. Left eye exhibits no discharge.  Neck: Normal range of motion.  Cardiovascular: Normal rate, regular rhythm and normal heart sounds.   Pulmonary/Chest: Effort normal and breath sounds normal.  Abdominal: Soft. She exhibits no distension. There is tenderness in the left upper quadrant and left lower quadrant. There is no rigidity, no rebound and no guarding.  Musculoskeletal: Normal range of motion.  Neurological: She is alert and oriented to person, place, and time.  Skin: Skin is warm. She is not diaphoretic.    ED Course  Procedures (including critical care time) Labs Review Labs Reviewed  CBC WITH DIFFERENTIAL - Abnormal; Notable for the following:    Neutrophils Relative % 81 (*)    Lymphocytes Relative 10 (*)    All other components within normal limits  COMPREHENSIVE METABOLIC PANEL - Abnormal; Notable for the following:    Glucose, Bld 119 (*)    Albumin 3.3 (*)    GFR calc non Af Amer 64 (*)    GFR calc Af Amer 74 (*)    All other components within normal  limits  PROTIME-INR - Abnormal; Notable for the following:    Prothrombin Time  22.8 (*)    INR 2.09 (*)    All other components within normal limits  LACTIC ACID, PLASMA  URINALYSIS, ROUTINE W REFLEX MICROSCOPIC  POCT I-STAT TROPONIN I   Imaging Review Dg Abd Acute W/chest  09/10/2013   CLINICAL DATA:  Abdominal pain.  EXAM: ACUTE ABDOMEN SERIES (ABDOMEN 2 VIEW & CHEST 1 VIEW)  COMPARISON:  03/23/2012  FINDINGS: Left approach pacer wires are in stable orientation. Chronic cardiomegaly. Chronic small volume pleural effusion or pleural thickening at the left base. No edema or infiltrate. No effusion or pneumothorax. Rounded calcification in the right thoracic inlet this thyroidal based on CT 03/21/2011.  Dilated small bowel in the left abdomen, with relative paucity of colonic gas. There is mild fold thickening of the same segments. No evidence of bowel perforation.  No evidence of urolithiasis. Cholecystectomy changes. Diffuse spondylosis.  IMPRESSION: Dilated small bowel with mild fold thickening - most likely bowel obstruction.   Electronically Signed   By: Jorje Guild M.D.   On: 09/10/2013 22:48    EKG Interpretation   None       MDM   1. Partial small bowel obstruction     80 yo F w hx of SBOs (partial) who presents today with n/v, abdominal pain, with similar complaints to prior. Patient with 3 day history of symptoms.   Will obtain XR acute abdomen to evaluate for possible SBO. Basic labs pending.   XR c/w SBO. Will consult to hospitalist for admission. Will place NG tube for decompression. Admit orders placed. Patient admitted to hospitalist service in stable condition. Patient seen and evaluated by myself and my attending, Dr. Leonides Schanz.      Freddi Che, MD 09/10/13 [phone]

## 2013-09-10 NOTE — ED Provider Notes (Signed)
I saw and evaluated the patient, reviewed the resident's note and I agree with the findings and plan.  EKG Interpretation   None       Patient is a 89 from female with a history of hypertension, hyperlipidemia, A. fib on Coumadin, prior bowel obstruction who presents to the emergency department with 2 days of abdominal pain, distention, vomiting. She states her last bowel movement was 2 days ago she has not been passing gas since. She is hemodynamically stable but has a distended tender, tympanitic abdomen. X-ray concerning for bowel obstruction. Otherwise labs are unremarkable. Discussed with hospitalist for admission.  [place], DO 09/10/13 2339

## 2013-09-11 ENCOUNTER — Inpatient Hospital Stay (HOSPITAL_COMMUNITY): Payer: Medicare Other

## 2013-09-11 ENCOUNTER — Encounter (HOSPITAL_COMMUNITY): Payer: Self-pay | Admitting: Radiology

## 2013-09-11 DIAGNOSIS — E7800 Pure hypercholesterolemia, unspecified: Secondary | ICD-10-CM

## 2013-09-11 DIAGNOSIS — K56609 Unspecified intestinal obstruction, unspecified as to partial versus complete obstruction: Secondary | ICD-10-CM | POA: Diagnosis present

## 2013-09-11 LAB — CBC
HCT: 35.8 % — ABNORMAL LOW (ref 36.0–46.0)
Hemoglobin: 11.5 g/dL — ABNORMAL LOW (ref 12.0–15.0)
MCH: 29.3 pg (ref 26.0–34.0)
MCHC: 32.1 g/dL (ref 30.0–36.0)
MCV: 91.3 fL (ref 78.0–100.0)
Platelets: 185 10*3/uL (ref 150–400)
RBC: 3.92 MIL/uL (ref 3.87–5.11)
RDW: 15.2 % (ref 11.5–15.5)
WBC: 8.1 10*3/uL (ref 4.0–10.5)

## 2013-09-11 LAB — URINALYSIS, ROUTINE W REFLEX MICROSCOPIC
Bilirubin Urine: NEGATIVE
GLUCOSE, UA: NEGATIVE mg/dL
KETONES UR: 15 mg/dL — AB
Nitrite: NEGATIVE
PH: 6.5 (ref 5.0–8.0)
Protein, ur: 30 mg/dL — AB
Specific Gravity, Urine: 1.021 (ref 1.005–1.030)
Urobilinogen, UA: 0.2 mg/dL (ref 0.0–1.0)

## 2013-09-11 LAB — COMPREHENSIVE METABOLIC PANEL
ALBUMIN: 2.8 g/dL — AB (ref 3.5–5.2)
ALK PHOS: 82 U/L (ref 39–117)
ALT: 60 U/L — AB (ref 0–35)
AST: 118 U/L — AB (ref 0–37)
BILIRUBIN TOTAL: 1.6 mg/dL — AB (ref 0.3–1.2)
BUN: 15 mg/dL (ref 6–23)
CHLORIDE: 104 meq/L (ref 96–112)
CO2: 26 mEq/L (ref 19–32)
Calcium: 8.1 mg/dL — ABNORMAL LOW (ref 8.4–10.5)
Creatinine, Ser: 0.92 mg/dL (ref 0.50–1.10)
GFR calc Af Amer: 68 mL/min — ABNORMAL LOW (ref >90)
GFR calc non Af Amer: 59 mL/min — ABNORMAL LOW (ref >90)
Glucose, Bld: 105 mg/dL — ABNORMAL HIGH (ref 70–99)
POTASSIUM: 4.2 meq/L (ref 3.7–5.3)
Sodium: 140 mEq/L (ref 137–147)
Total Protein: 6.1 g/dL (ref 6.0–8.3)

## 2013-09-11 LAB — URINE MICROSCOPIC-ADD ON

## 2013-09-11 LAB — PROTIME-INR
INR: 2.22 — ABNORMAL HIGH (ref 0.00–1.49)
Prothrombin Time: 23.9 seconds — ABNORMAL HIGH (ref 11.6–15.2)

## 2013-09-11 MED ORDER — WARFARIN SODIUM 1 MG PO TABS
1.0000 mg | ORAL_TABLET | Freq: Once | ORAL | Status: AC
  Administered 2013-09-11: 1 mg via ORAL
  Filled 2013-09-11: qty 1

## 2013-09-11 MED ORDER — WARFARIN - PHARMACIST DOSING INPATIENT: Freq: Every day | Status: DC

## 2013-09-11 MED ORDER — WARFARIN SODIUM 2.5 MG PO TABS
2.5000 mg | ORAL_TABLET | Freq: Once | ORAL | Status: DC
  Filled 2013-09-11: qty 1

## 2013-09-11 MED ORDER — HYDROMORPHONE HCL PF 1 MG/ML IJ SOLN: 0.5000 mg | INTRAMUSCULAR | Status: DC | PRN

## 2013-09-11 MED ORDER — POTASSIUM CHLORIDE IN NACL 20-0.9 MEQ/L-% IV SOLN
INTRAVENOUS | Status: DC
  Administered 2013-09-11 – 2013-09-12 (×3): via INTRAVENOUS
  Filled 2013-09-11 (×6): qty 1000

## 2013-09-11 MED ORDER — SODIUM CHLORIDE 0.9 % IJ SOLN
3.0000 mL | Freq: Two times a day (BID) | INTRAMUSCULAR | Status: DC
  Administered 2013-09-11: 3 mL via INTRAVENOUS

## 2013-09-11 MED ORDER — PANTOPRAZOLE SODIUM 40 MG IV SOLR
40.0000 mg | INTRAVENOUS | Status: DC
  Administered 2013-09-11 – 2013-09-12 (×2): 40 mg via INTRAVENOUS
  Filled 2013-09-11 (×3): qty 40

## 2013-09-11 MED ORDER — PROPAFENONE HCL 225 MG PO TABS
225.0000 mg | ORAL_TABLET | Freq: Three times a day (TID) | ORAL | Status: DC
  Administered 2013-09-11 – 2013-09-13 (×7): 225 mg via NASOGASTRIC
  Filled 2013-09-11 (×10): qty 1

## 2013-09-11 MED ORDER — ONDANSETRON HCL 4 MG/2ML IJ SOLN: 4.0000 mg | Freq: Four times a day (QID) | INTRAMUSCULAR | Status: DC | PRN

## 2013-09-11 MED ORDER — ONDANSETRON HCL 4 MG PO TABS: 4.0000 mg | ORAL_TABLET | Freq: Four times a day (QID) | ORAL | Status: DC | PRN

## 2013-09-11 NOTE — Progress Notes (Addendum)
[facility] PROGRESS NOTE  SHERRICA NIEHAUS [id number] DOB: [birth year] DOA: 09/10/2013 PCP: Thressa Sheller, MD  Brief narrative: 79 year old female with past medical history of A fib on coumadin, endometroal carcinoma nd status post RT and hysterectomy who presented to [facility] ED 09/11/2103 with worsening abdominal pain, N/V and poor oral intake for past 2-3 days prior to this admission. Pt was subsequently found to have SBO based on abdominal x ray and CT abd. She is being treated conservatively per surgery recommendations.   Assessment/Plan:  Principal Problem:   SBO (small bowel obstruction) - secondary to adhesion status post hysterectomy, RT for endometrial carcinoma  - conservative management per surgery; pt does not have NG tube - she is NPO and has no N/V - will obtain x ray of the abdomen in am - pain management with dilaudid 0.5 mg IV every 3 hours PRN severe pain - continue current IV fluids  Active Problems:   Atrial fibrillation - on coumadin  Per pharmacy - also takes Rythmol every  8 hours (propafenone)   Code Status: full code Family Communication: no family at the bedside Disposition Plan: home when stable  Leisa Lenz, MD  [facility] Pager [phone]  If 7PM-7AM, please contact night-coverage [email or web address] Password [email or web address] 09/11/2013, 9:29 PM   LOS: 1 day   Consultants:  Surgery   Procedures:  None   Antibiotics:  None   HPI/Subjective: Says she feels better.  Objective: Filed Vitals:   09/11/13 0128 09/11/13 0444 09/11/13 1350 09/11/13 2006  BP: 156/48 149/55 157/61 145/59  Pulse: 69 70 74 66  Temp: 98.6 F (37 C) 98.7 F (37.1 C) 98 F (36.7 C) 97.8 F (36.6 C)  TempSrc: Oral Oral Oral Oral  Resp: 18 18 18 18   Height: 5\' 7"  (1.702 m)     Weight: 91.4 kg (201 lb 8 oz)     SpO2: 99% 98% 97% 97%    Intake/Output Summary (Last 24 hours) at 09/11/13 2129 Last data filed at 09/11/13 1839  Gross per 24 hour  Intake 1238.75 ml   Output      2 ml  Net 1236.75 ml    Exam:   General:  Pt is alert, follows commands appropriately, not in acute distress  Cardiovascular: irregular rhythm, rate controlled, S1/S2 appreciated   Respiratory: Clear to auscultation bilaterally, no wheezing, no crackles, no rhonchi  Abdomen: Softly distended, bowel sounds present, no guarding  Extremities: No edema, pulses DP and PT palpable bilaterally  Neuro: Grossly nonfocal  Data Reviewed: Basic Metabolic Panel:  Recent Labs Lab 09/10/13 2119 09/11/13 0440  NA 143 140  K 4.1 4.2  CL 103 104  CO2 26 26  GLUCOSE 119* 105*  BUN 14 15  CREATININE 0.86 0.92  CALCIUM 9.0 8.1*   Liver Function Tests:  Recent Labs Lab 09/10/13 2119 09/11/13 0440  AST 22 118*  ALT 18 60*  ALKPHOS 73 82  BILITOT 0.8 1.6*  PROT 7.3 6.1  ALBUMIN 3.3* 2.8*   No results found for this basename: LIPASE, AMYLASE,  in the last 168 hours No results found for this basename: AMMONIA,  in the last 168 hours CBC:  Recent Labs Lab 09/10/13 2119 09/11/13 0440  WBC 8.2 8.1  NEUTROABS 6.6  --   HGB 13.2 11.5*  HCT 39.3 35.8*  MCV 90.6 91.3  PLT 220 185   Cardiac Enzymes: No results found for this basename: CKTOTAL, CKMB, CKMBINDEX, TROPONINI,  in the last 168 hours BNP: No components found with  this basename: POCBNP,  CBG: No results found for this basename: GLUCAP,  in the last 168 hours  No results found for this or any previous visit (from the past 240 hour(s)).   Studies: Ct Abdomen Pelvis Wo Contrast 09/11/2013   IMPRESSION: Numerous adhesions in the abdomen and upper pelvis in this patient status post hysterectomy and radiation therapy for endometrial carcinoma. CT imaging features today all consistent with partial small bowel obstruction. There is a long transition zone from distended small bowel measuring about 3.5 cm down to nondistended small bowel and this area shows mild circumferential wall thickening through the  transition. Overall appearance of the transition zone is strikingly similar to the appearance on the 09/14/2011 exam and given the lack of change in small appearance/orientation between the 2 exams, adhesions are almost certainly involved. The mild circumferential wall thickening today may be secondary to an associated degree of infectious or inflammatory enteritis. Given the relatively remote timing of the endometrial cancer and the smooth circumferential wall thickening today, metastatic involvement of the small bowel is considered unlikely.   Dg Abd Acute W/chest 09/10/2013  MPRESSION: Dilated small bowel with mild fold thickening - most likely bowel obstruction.      Scheduled Meds: . pantoprazole   40 mg Intravenous Q24H  . propafenone  225 mg Per NG tube Q8H  . Warfarin    Does not apply q1800   Continuous Infusions: . 0.9 % NaCl with KCl 20 mEq / L 75 mL/hr at 09/11/13 1659

## 2013-09-11 NOTE — Progress Notes (Addendum)
ANTICOAGULATION CONSULT NOTE - Initial Consult  Pharmacy Consult for Coumadin Indication: atrial fibrillation  Allergies  Allergen Reactions  . Other     Contrast dye   . Penicillins Itching  . Shellfish-Derived Products Nausea And Vomiting  . Statins     Myalgias,elevated LFT studies  . Iohexol Hives and Itching    Itching in eyes, one hive on face.    Patient Measurements: Height: 5\' 7"  (170.2 cm) Weight: 201 lb 8 oz (91.4 kg) IBW/kg (Calculated) : 61.6  Vital Signs: Temp: 98.6 F (37 C) (01/05 0128) Temp src: Oral (01/05 0128) BP: 156/48 mmHg (01/05 0128) Pulse Rate: 69 (01/05 0128)  Labs:  Recent Labs  09/08/13 1507 09/10/13 2109 09/10/13 2119  HGB  --   --  13.2  HCT  --   --  39.3  PLT  --   --  220  LABPROT  --  22.8*  --   INR 4.0 2.09*  --   CREATININE  --   --  0.86    Estimated Creatinine Clearance: 64.6 ml/min (by C-G formula based on Cr of 0.86).   Medical History: Past Medical History  Diagnosis Date  . Pacemaker -Medtronic   . Hypertension   . Peripheral neuropathy   . High cholesterol   . Heart murmur   . Shortness of breath on exertion   . Stroke     "small stroke & several TIA's"  . Atrial fibrillation   . History of radiation therapy   . Partial bowel obstruction JAN/2013  . Colon polyp 2011  . Endometrial cancer 2006    Stage IIIc  s/p radiation  . Breast cancer 1999    LOBULAR CARCINOMA IN SITU...  . Osteopenia 02/2013    T score -1.4 FRAX 11%/2.1%    Medications:  Prescriptions prior to admission  Medication Sig Dispense Refill  . aspirin 81 MG tablet Take 81 mg by mouth daily.      . calcium carbonate (TUMS - DOSED IN MG ELEMENTAL CALCIUM) 500 MG chewable tablet Chew 1 tablet by mouth daily.      . Cholecalciferol (VITAMIN D-3) 5000 UNITS TABS Take 1 tablet by mouth 4 (four) times a week. Takes on Saturday, Sunday, Tuesday and Thursday      . ezetimibe (ZETIA) 10 MG tablet Take 0.5 tablets (5 mg total) by mouth daily.   30 tablet  5  . furosemide (LASIX) 20 MG tablet Take 20 mg by mouth daily.      . hydrALAZINE (APRESOLINE) 25 MG tablet Take 25 mg by mouth 2 (two) times daily.      Marland Kitchen L-Methylfolate-B6-B12 (METANX PO) Take 1 tablet by mouth daily.       Marland Kitchen lisinopril (PRINIVIL,ZESTRIL) 10 MG tablet Take 10 mg by mouth 2 (two) times daily.      . metoprolol succinate (TOPROL-XL) 100 MG 24 hr tablet Take 1 tablet (100 mg total) by mouth daily. Take with or immediately following a meal.  90 tablet  3  . pregabalin (LYRICA) 75 MG capsule Take 75 mg by mouth 2 (two) times daily.      . propafenone (RYTHMOL) 225 MG tablet Take 1 tablet (225 mg total) by mouth every 8 (eight) hours.  270 tablet  3  . warfarin (COUMADIN) 2.5 MG tablet Take 1.25-2.5 mg by mouth See admin instructions. Take 0.5 tablet on Monday and Friday. Take 1 tablet all other days.       Scheduled:  . pantoprazole (PROTONIX) IV  40 mg Intravenous Q24H  . propafenone  225 mg Per NG tube Q8H  . sodium chloride  3 mL Intravenous Q12H   Infusions:  . sodium chloride    . 0.9 % NaCl with KCl 20 mEq / L      Assessment: 79yo female c/o constipation since Friday, has h/o SBO and concerned for new SBO, Xray suggests obstruction, to continue Coumadin for Afib; admitted with therapeutic INR at low end of goal though had INR of 4 at anticoag clinic on 1/2 and had held at least one dose.  Goal of Therapy:  INR 2-3   Plan:  Will give Coumadin 2.5mg  po x1 today and monitor INR for dose adjustments.  Wynona Neat, PharmD, BCPS  09/11/2013,2:09 AM  Addendum: INR has trended up to 2.22 this AM. No other significant changes noted.  Plan: 1) Change today's coumadin dose to 1 mg x 1 dose. Monitor daily INR  Albertina Parr, PharmD.  Clinical Pharmacist Pager [phone]

## 2013-09-11 NOTE — Consult Note (Signed)
Reason for Consult: p SBO Referring Physician: Dr. Rhea Pink Yolanda Spears is an 79 y.o. female.  HPI:  Patient is a 79 year old female who presents with 2-3 days history of abdominal pain, nausea, and vomiting. She has had bowel obstructions in the past, and she complained this felt like the bowel obstruction that she had previously.   She does not recall passing gas or having a bowel movement for around 2 days. She complained that the pain and bloating has come on since then. The pain came on relatively gradually. They tried to place an NG tube in the ED twice, but she had significant nasopharyngeal bleeding.  This has resolved.    Past Medical History  Diagnosis Date  . Pacemaker -Medtronic   . Hypertension   . Peripheral neuropathy   . High cholesterol   . Heart murmur   . Shortness of breath on exertion   . Stroke     "small stroke & several TIA's"  . Atrial fibrillation   . History of radiation therapy   . Partial bowel obstruction JAN/2013  . Colon polyp 2011  . Endometrial cancer 2006    Stage IIIc  s/p radiation  . Breast cancer 1999    LOBULAR CARCINOMA IN SITU...  . Osteopenia 02/2013    T score -1.4 FRAX 11%/2.1%    Past Surgical History  Procedure Laterality Date  . Lymph node resection  2007  . Cholecystectomy  1990's  . Insert / replace / remove pacemaker  2007    initial placement  . Mastectomy  1999    bilaterally w/lymph node dissection  . Abdominal hysterectomy  2006    BSO  . Oophorectomy      BSO  . Exploratory laparotomy  2007    remove abdominal cyst    Family History  Problem Relation Age of Onset  . Cancer Mother 88    UTERINE  . Breast cancer Mother 28  . Hypertension Mother   . Uterine cancer Mother   . Cancer Father     PROSTATE  . Hypertension Father     Social History:  reports that she quit smoking about 14 years ago. Her smoking use included Cigarettes. She has a 40 pack-year smoking history. She has never used smokeless tobacco.  She reports that she does not drink alcohol or use illicit drugs.  Allergies:  Allergies  Allergen Reactions  . Other     Contrast dye   . Penicillins Itching  . Shellfish-Derived Products Nausea And Vomiting  . Statins     Myalgias,elevated LFT studies  . Iohexol Hives and Itching    Itching in eyes, one hive on face.    Medications:  MedicationsLong-Term  Outpatient Medications Hospital Medications   New medications from outside sources are available for reconciliation   aspirin 81 MG tablet    calcium carbonate (TUMS - DOSED IN MG ELEMENTAL CALCIUM) 500 MG chewable tablet    Cholecalciferol (VITAMIN D-3) 5000 UNITS TABS    ezetimibe (ZETIA) 10 MG tablet    furosemide (LASIX) 20 MG tablet    hydrALAZINE (APRESOLINE) 25 MG tablet    L-Methylfolate-B6-B12 (METANX PO)    lisinopril (PRINIVIL,ZESTRIL) 10 MG tablet    metoprolol succinate (TOPROL-XL) 100 MG 24 hr tablet    pregabalin (LYRICA) 75 MG capsule    propafenone (RYTHMOL) 225 MG tablet    warfarin (COUMADIN) 2.5 MG tablet      Results for orders placed during the hospital encounter of 09/10/13 (  from the past 48 hour(s))  PROTIME-INR     Status: Abnormal   Collection Time    09/10/13  9:09 PM      Result Value Range   Prothrombin Time 22.8 (*) 11.6 - 15.2 seconds   INR 2.09 (*) 0.00 - 1.49  CBC WITH DIFFERENTIAL     Status: Abnormal   Collection Time    09/10/13  9:19 PM      Result Value Range   WBC 8.2  4.0 - 10.5 K/uL   RBC 4.34  3.87 - 5.11 MIL/uL   Hemoglobin 13.2  12.0 - 15.0 g/dL   HCT 39.3  36.0 - 46.0 %   MCV 90.6  78.0 - 100.0 fL   MCH 30.4  26.0 - 34.0 pg   MCHC 33.6  30.0 - 36.0 g/dL   RDW 15.0  11.5 - 15.5 %   Platelets 220  150 - 400 K/uL   Neutrophils Relative % 81 (*) 43 - 77 %   Neutro Abs 6.6  1.7 - 7.7 K/uL   Lymphocytes Relative 10 (*) 12 - 46 %   Lymphs Abs 0.8  0.7 - 4.0 K/uL   Monocytes Relative 9  3 - 12 %   Monocytes Absolute 0.7  0.1 - 1.0 K/uL   Eosinophils Relative 0  0 -  5 %   Eosinophils Absolute 0.0  0.0 - 0.7 K/uL   Basophils Relative 0  0 - 1 %   Basophils Absolute 0.0  0.0 - 0.1 K/uL  COMPREHENSIVE METABOLIC PANEL     Status: Abnormal   Collection Time    09/10/13  9:19 PM      Result Value Range   Sodium 143  137 - 147 mEq/L   Comment: Please note change in reference range.   Potassium 4.1  3.7 - 5.3 mEq/L   Comment: Please note change in reference range.   Chloride 103  96 - 112 mEq/L   CO2 26  19 - 32 mEq/L   Glucose, Bld 119 (*) 70 - 99 mg/dL   BUN 14  6 - 23 mg/dL   Creatinine, Ser 0.86  0.50 - 1.10 mg/dL   Calcium 9.0  8.4 - 10.5 mg/dL   Total Protein 7.3  6.0 - 8.3 g/dL   Albumin 3.3 (*) 3.5 - 5.2 g/dL   AST 22  0 - 37 U/L   ALT 18  0 - 35 U/L   Alkaline Phosphatase 73  39 - 117 U/L   Total Bilirubin 0.8  0.3 - 1.2 mg/dL   GFR calc non Af Amer 64 (*) >90 mL/min   GFR calc Af Amer 74 (*) >90 mL/min   Comment: (NOTE)     The eGFR has been calculated using the CKD EPI equation.     This calculation has not been validated in all clinical situations.     eGFR's persistently <90 mL/min signify possible Chronic Kidney     Disease.  POCT I-STAT TROPONIN I     Status: None   Collection Time    09/10/13  9:26 PM      Result Value Range   Troponin i, poc 0.02  0.00 - 0.08 ng/mL   Comment 3            Comment: Due to the release kinetics of cTnI,     a negative result within the first hours     of the onset of symptoms does not rule out  myocardial infarction with certainty.     If myocardial infarction is still suspected,     repeat the test at appropriate intervals.  LACTIC ACID, PLASMA     Status: None   Collection Time    09/10/13 10:01 PM      Result Value Range   Lactic Acid, Venous 1.1  0.5 - 2.2 mmol/L  URINALYSIS, ROUTINE W REFLEX MICROSCOPIC     Status: Abnormal   Collection Time    09/11/13 12:02 AM      Result Value Range   Color, Urine YELLOW  YELLOW   APPearance CLOUDY (*) CLEAR   Specific Gravity, Urine 1.021   1.005 - 1.030   pH 6.5  5.0 - 8.0   Glucose, UA NEGATIVE  NEGATIVE mg/dL   Hgb urine dipstick SMALL (*) NEGATIVE   Bilirubin Urine NEGATIVE  NEGATIVE   Ketones, ur 15 (*) NEGATIVE mg/dL   Protein, ur 30 (*) NEGATIVE mg/dL   Urobilinogen, UA 0.2  0.0 - 1.0 mg/dL   Nitrite NEGATIVE  NEGATIVE   Leukocytes, UA SMALL (*) NEGATIVE  URINE MICROSCOPIC-ADD ON     Status: Abnormal   Collection Time    09/11/13 12:02 AM      Result Value Range   Squamous Epithelial / LPF MANY (*) RARE   WBC, UA 11-20  <3 WBC/hpf   RBC / HPF 3-6  <3 RBC/hpf   Bacteria, UA FEW (*) RARE    Dg Abd Acute W/chest  09/10/2013   CLINICAL DATA:  Abdominal pain.  EXAM: ACUTE ABDOMEN SERIES (ABDOMEN 2 VIEW & CHEST 1 VIEW)  COMPARISON:  03/23/2012  FINDINGS: Left approach pacer wires are in stable orientation. Chronic cardiomegaly. Chronic small volume pleural effusion or pleural thickening at the left base. No edema or infiltrate. No effusion or pneumothorax. Rounded calcification in the right thoracic inlet this thyroidal based on CT 03/21/2011.  Dilated small bowel in the left abdomen, with relative paucity of colonic gas. There is mild fold thickening of the same segments. No evidence of bowel perforation.  No evidence of urolithiasis. Cholecystectomy changes. Diffuse spondylosis.  IMPRESSION: Dilated small bowel with mild fold thickening - most likely bowel obstruction.   Electronically Signed   By: Jorje Guild M.D.   On: 09/10/2013 22:48    Review of Systems  Constitutional: Negative.   HENT: Negative.   Eyes: Negative.   Respiratory: Negative.   Cardiovascular: Negative.   Gastrointestinal: Positive for nausea, vomiting and abdominal pain.  Genitourinary: Negative.   Musculoskeletal: Negative.   Skin: Negative.   Neurological: Negative.   Endo/Heme/Allergies: Negative.   Psychiatric/Behavioral: Negative.    Blood pressure 156/48, pulse 69, temperature 98.6 F (37 C), temperature source Oral, resp. rate 18,  height 5' 7"  (1.702 m), weight 201 lb 8 oz (91.4 kg), SpO2 99.00%. Physical Exam  Constitutional: She is oriented to person, place, and time. She appears well-developed and well-nourished. No distress.  HENT:  Head: Normocephalic and atraumatic.  Eyes: Conjunctivae are normal. Pupils are equal, round, and reactive to light. No scleral icterus.  Neck: Normal range of motion. No tracheal deviation present.  Cardiovascular: Normal rate, normal heart sounds and intact distal pulses.  Exam reveals no gallop and no friction rub.   No murmur heard. Respiratory: Effort normal and breath sounds normal. No respiratory distress. She has no wheezes. She has no rales. She exhibits no tenderness.  GI: Soft. Bowel sounds are normal. She exhibits distension. She exhibits no mass. There is tenderness (  mild diffuse tenderness). There is no rebound and no guarding.  Musculoskeletal: Normal range of motion.  Neurological: She is alert and oriented to person, place, and time.  Skin: Skin is warm and dry. No rash noted. She is not diaphoretic. No erythema. No pallor.  Psychiatric: She has a normal mood and affect. Her behavior is normal. Judgment and thought content normal.    Assessment/Plan: SBO  IV fluids N.p.o. Retry NGT placement in a.m. Repeat films Hopefully patient will resolve obstruction spontaneously again. She has previously had a stroke with her tachy/brady syndrome when she is off anticoagulation.  Yolanda Spears 09/11/2013, 4:03 AM

## 2013-09-11 NOTE — Progress Notes (Signed)
Pt still NPO; pt inquiring about further testing to be done; no orders; MD paged; will await callback.

## 2013-09-11 NOTE — ED Notes (Signed)
Two RNs attempted to place NG tube, both attempts were unsuccessful. RN to inform admitting physician and General Surgery.

## 2013-09-11 NOTE — Progress Notes (Signed)
  Subjective: Pt has been having diarrhea this AM x5.  No further abd pain or n/v.  Objective: Vital signs in last 24 hours: Temp:  [98.2 F (36.8 C)-98.7 F (37.1 C)] 98.7 F (37.1 C) (01/05 0444) Pulse Rate:  [68-73] 70 (01/05 0444) Resp:  [16-18] 18 (01/05 0444) BP: (149-171)/(48-72) 149/55 mmHg (01/05 0444) SpO2:  [95 %-99 %] 98 % (01/05 0444) Weight:  [201 lb 8 oz (91.4 kg)] 201 lb 8 oz (91.4 kg) (01/05 0128) Last BM Date: 09/08/13  Intake/Output from previous day:   Intake/Output this shift:    General appearance: alert and cooperative Cardio: regular rate and rhythm, S1, S2 normal, no murmur, click, rub or gallop GI: soft, min dist, active BS, no rebound/guarding  Lab Results:   Recent Labs  09/10/13 2119 09/11/13 0440  WBC 8.2 8.1  HGB 13.2 11.5*  HCT 39.3 35.8*  PLT 220 185   BMET  Recent Labs  09/10/13 2119 09/11/13 0440  NA 143 140  K 4.1 4.2  CL 103 104  CO2 26 26  GLUCOSE 119* 105*  BUN 14 15  CREATININE 0.86 0.92  CALCIUM 9.0 8.1*   PT/INR  Recent Labs  09/10/13 2109 09/11/13 0500  LABPROT 22.8* 23.9*  INR 2.09* 2.22*   ABG No results found for this basename: PHART, PCO2, PO2, HCO3,  in the last 72 hours  Studies/Results: Dg Abd Acute W/chest  09/10/2013   CLINICAL DATA:  Abdominal pain.  EXAM: ACUTE ABDOMEN SERIES (ABDOMEN 2 VIEW & CHEST 1 VIEW)  COMPARISON:  03/23/2012  FINDINGS: Left approach pacer wires are in stable orientation. Chronic cardiomegaly. Chronic small volume pleural effusion or pleural thickening at the left base. No edema or infiltrate. No effusion or pneumothorax. Rounded calcification in the right thoracic inlet this thyroidal based on CT 03/21/2011.  Dilated small bowel in the left abdomen, with relative paucity of colonic gas. There is mild fold thickening of the same segments. No evidence of bowel perforation.  No evidence of urolithiasis. Cholecystectomy changes. Diffuse spondylosis.  IMPRESSION: Dilated small  bowel with mild fold thickening - most likely bowel obstruction.   Electronically Signed   By: Jorje Guild M.D.   On: 09/10/2013 22:48    Anti-infectives: Anti-infectives   None      Assessment/Plan: pSBO CT pending Hold off on NGT this AM Serial exams  LOS: 1 day    Rosario Jacks., Anne Hahn 09/11/2013

## 2013-09-11 NOTE — Progress Notes (Signed)
Utilization Review Completed.Affan Callow T1/01/2014  

## 2013-09-11 NOTE — H&P (Addendum)
[facility] History and Physical  Patient: Yolanda Spears  [id number]  DOB: 03-17-1935  DOS: the patient was seen and examined on 09/10/2013 PCP: Thressa Sheller, MD  Chief Complaint: Nausea vomiting and abdominal pain  HPI: Yolanda Spears is a 79 y.o. female with Past medical history of atrial fibrillation, biventricular pacemaker, hypertension, history of partial small bowel obstruction, breast cancer, endometrial cancer. The patient is coming from home. The patient presents with complaints of abdominal pain that has been ongoing since last 3 days. She mentions that around Christmas she had an episode of nausea and vomiting associated with abdominal pain that resolved on its own on the Christmas Day. Before this I did Christmas she started having complaints of cough and sore throat that lasted for 3 days without any fever or sputum production. Since last 3 days she started having complaints of abdominal pain and has not had any bowel movements. She started having episodes of vomiting and had 3 episodes so far and then decided to come to the hospital as she was also started having a feeling of weakness and malaise lethargy. Pt denies any fever, chills, headache, cough, chest pain, palpitation, shortness of breath, orthopnea, PND, active bleeding, burning urination, dizziness, pedal edema,  focal neurological deficit.   Review of Systems: as mentioned in the history of present illness.  A Comprehensive review of the other systems is negative.  Past Medical History  Diagnosis Date  . Pacemaker -Medtronic   . Hypertension   . Peripheral neuropathy   . High cholesterol   . Heart murmur   . Shortness of breath on exertion   . Stroke     "small stroke & several TIA's"  . Atrial fibrillation   . History of radiation therapy   . Partial bowel obstruction JAN/2013  . Colon polyp 2011  . Endometrial cancer 2006    Stage IIIc  s/p radiation  . Breast cancer 1999    LOBULAR  CARCINOMA IN SITU...  . Osteopenia 02/2013    T score -1.4 FRAX 11%/2.1%   Past Surgical History  Procedure Laterality Date  . Lymph node resection  2007  . Cholecystectomy  1990's  . Insert / replace / remove pacemaker  2007    initial placement  . Mastectomy  1999    bilaterally w/lymph node dissection  . Abdominal hysterectomy  2006    BSO  . Oophorectomy      BSO  . Exploratory laparotomy  2007    remove abdominal cyst   Social History:  reports that she quit smoking about 14 years ago. Her smoking use included Cigarettes. She has a 40 pack-year smoking history. She has never used smokeless tobacco. She reports that she does not drink alcohol or use illicit drugs. Independent for most of her  ADL.  Allergies  Allergen Reactions  . Other     Contrast dye   . Penicillins Itching  . Shellfish-Derived Products Nausea And Vomiting  . Statins     Myalgias,elevated LFT studies  . Iohexol Hives and Itching    Itching in eyes, one hive on face.    Family History  Problem Relation Age of Onset  . Cancer Mother 22    UTERINE  . Breast cancer Mother 22  . Hypertension Mother   . Uterine cancer Mother   . Cancer Father     PROSTATE  . Hypertension Father     Prior to Admission medications   Medication Sig Start Date End Date Taking?  Authorizing Provider  aspirin 81 MG tablet Take 81 mg by mouth daily.   Yes Historical Provider, MD  calcium carbonate (TUMS - DOSED IN MG ELEMENTAL CALCIUM) 500 MG chewable tablet Chew 1 tablet by mouth daily.   Yes Historical Provider, MD  Cholecalciferol (VITAMIN D-3) 5000 UNITS TABS Take 1 tablet by mouth 4 (four) times a week. Takes on Saturday, Sunday, Tuesday and Thursday   Yes Historical Provider, MD  ezetimibe (ZETIA) 10 MG tablet Take 0.5 tablets (5 mg total) by mouth daily. 03/16/13  Yes Darlin Coco, MD  furosemide (LASIX) 20 MG tablet Take 20 mg by mouth daily.   Yes Historical Provider, MD  hydrALAZINE (APRESOLINE) 25 MG  tablet Take 25 mg by mouth 2 (two) times daily.   Yes Historical Provider, MD  L-Methylfolate-B6-B12 (METANX PO) Take 1 tablet by mouth daily.    Yes Historical Provider, MD  lisinopril (PRINIVIL,ZESTRIL) 10 MG tablet Take 10 mg by mouth 2 (two) times daily.   Yes Historical Provider, MD  metoprolol succinate (TOPROL-XL) 100 MG 24 hr tablet Take 1 tablet (100 mg total) by mouth daily. Take with or immediately following a meal. 01/06/13  Yes Darlin Coco, MD  pregabalin (LYRICA) 75 MG capsule Take 75 mg by mouth 2 (two) times daily.   Yes Historical Provider, MD  propafenone (RYTHMOL) 225 MG tablet Take 1 tablet (225 mg total) by mouth every 8 (eight) hours. 07/31/13  Yes Darlin Coco, MD  warfarin (COUMADIN) 2.5 MG tablet Take 1.25-2.5 mg by mouth See admin instructions. Take 0.5 tablet on Monday and Friday. Take 1 tablet all other days.   Yes Historical Provider, MD    Physical Exam: Filed Vitals:   09/10/13 2100  BP: 158/72  Pulse: 73  Temp: 98.2 F (36.8 C)  TempSrc: Oral  Resp: 16  SpO2: 95%    General: Alert, Awake and Oriented to Time, Place and Person. Appear in moderate distress Eyes: PERRL ENT: Oral Mucosa clear dry. Neck: No JVD Cardiovascular: S1 and S2 Present, no Murmur, Peripheral Pulses Present Respiratory: Bilateral Air entry equal and Decreased, Clear to Auscultation,  No Crackles, no wheezes Abdomen: Bowel Sound Present hyperactive, Soft and diffusely tender voluntry guarding no rigidity Skin: No Rash Extremities: No Pedal edema, no calf tenderness Neurologic: Grossly Unremarkable.  Labs on Admission:  CBC:  Recent Labs Lab 09/10/13 2119  WBC 8.2  NEUTROABS 6.6  HGB 13.2  HCT 39.3  MCV 90.6  PLT 220    CMP     Component Value Date/Time   NA 143 09/10/2013 2119   K 4.1 09/10/2013 2119   CL 103 09/10/2013 2119   CO2 26 09/10/2013 2119   GLUCOSE 119* 09/10/2013 2119   BUN 14 09/10/2013 2119   CREATININE 0.86 09/10/2013 2119   CALCIUM 9.0 09/10/2013 2119    PROT 7.3 09/10/2013 2119   ALBUMIN 3.3* 09/10/2013 2119   AST 22 09/10/2013 2119   ALT 18 09/10/2013 2119   ALKPHOS 73 09/10/2013 2119   BILITOT 0.8 09/10/2013 2119   GFRNONAA 64* 09/10/2013 2119   GFRAA 74* 09/10/2013 2119    No results found for this basename: LIPASE, AMYLASE,  in the last 168 hours No results found for this basename: AMMONIA,  in the last 168 hours  No results found for this basename: CKTOTAL, CKMB, CKMBINDEX, TROPONINI,  in the last 168 hours BNP (last 3 results) No results found for this basename: PROBNP,  in the last 8760 hours  Radiological Exams on Admission:  Dg Abd Acute W/chest  09/10/2013   CLINICAL DATA:  Abdominal pain.  EXAM: ACUTE ABDOMEN SERIES (ABDOMEN 2 VIEW & CHEST 1 VIEW)  COMPARISON:  03/23/2012  FINDINGS: Left approach pacer wires are in stable orientation. Chronic cardiomegaly. Chronic small volume pleural effusion or pleural thickening at the left base. No edema or infiltrate. No effusion or pneumothorax. Rounded calcification in the right thoracic inlet this thyroidal based on CT 03/21/2011.  Dilated small bowel in the left abdomen, with relative paucity of colonic gas. There is mild fold thickening of the same segments. No evidence of bowel perforation.  No evidence of urolithiasis. Cholecystectomy changes. Diffuse spondylosis.  IMPRESSION: Dilated small bowel with mild fold thickening - most likely bowel obstruction.   Electronically Signed   By: Jorje Guild M.D.   On: 09/10/2013 22:48    Assessment/Plan Principal Problem:   SBO (small bowel obstruction) Active Problems:   Atrial fibrillation   Pacemaker- Dual-chamber-mdt   TIA (transient ischemic attack)   1. SBO (small bowel obstruction) At present the patient appears to have small bowel obstruction based on the x-ray and symptoms. She appears hemodynamically stable as well as her lactic acid is not elevated which is suggesting that she does not have any acute complication at present. General  surgery has been consulted. CT abdomen pelvis without contrast is ordered. Patient will be kept n.p.o., IV fluids, NG tube insertion, IV Zofran as needed for nausea. Continue pain control with IV Dilaudid  2. Atrial fibrillation The patient currently appears rate controlled. She takes Rythmol which I would continue as well as metoprolol. I would also continue her on Coumadin at present since she has history of TIA and stroke when she was off anticoagulation. She has not taken her Coumadin since last 2 days, as before that her INR was supratherapeutic. Was NG tube is in I would continue her on this medication with NG tube.  Consults: General surgery  DVT Prophylaxis: mechanical compression device Nutrition: N.p.o.  Code Status: Full  Family Communication: Family was present at bedside, opportunity was given to ask question and all questions were answered satisfactorily at the time of interview. Disposition: Admitted to inpatient in med-surge unit.  Author: Berle Mull, MD [facility] Pager: [phone] 09/11/2013, 12:03 AM    If 7PM-7AM, please contact night-coverage [email or web address] Password [email or web address]

## 2013-09-12 DIAGNOSIS — I119 Hypertensive heart disease without heart failure: Secondary | ICD-10-CM

## 2013-09-12 DIAGNOSIS — I440 Atrioventricular block, first degree: Secondary | ICD-10-CM

## 2013-09-12 LAB — URINE CULTURE: Colony Count: >=100000

## 2013-09-12 LAB — PROTIME-INR
INR: 2.14 — AB (ref 0.00–1.49)
Prothrombin Time: 23.2 seconds — ABNORMAL HIGH (ref 11.6–15.2)

## 2013-09-12 MED ORDER — WARFARIN SODIUM 2 MG PO TABS
2.0000 mg | ORAL_TABLET | Freq: Once | ORAL | Status: AC
  Administered 2013-09-12: 2 mg via ORAL
  Filled 2013-09-12: qty 1

## 2013-09-12 NOTE — Progress Notes (Signed)
Subjective: STates feels better today.  No further diarrhea, had a "normal" BM this AM. CT scan results noted.  Objective: Vital signs in last 24 hours: Temp:  [97.8 F (36.6 C)-98 F (36.7 C)] 98 F (36.7 C) (01/06 0521) Pulse Rate:  [66-83] 83 (01/06 0521) Resp:  [18-20] 20 (01/06 0521) BP: (145-157)/(59-65) 154/65 mmHg (01/06 0521) SpO2:  [95 %-97 %] 95 % (01/06 0521) Last BM Date: 09/11/13  Intake/Output from previous day: 01/05 0701 - 01/06 0700 In: 903.8 [P.O.:30; I.V.:873.8] Out: 2 [Stool:2] Intake/Output this shift:    General appearance: alert and cooperative GI: soft, non-tender; bowel sounds normal; no masses,  no organomegaly  Lab Results:   Recent Labs  09/10/13 2119 09/11/13 0440  WBC 8.2 8.1  HGB 13.2 11.5*  HCT 39.3 35.8*  PLT 220 185   BMET  Recent Labs  09/10/13 2119 09/11/13 0440  NA 143 140  K 4.1 4.2  CL 103 104  CO2 26 26  GLUCOSE 119* 105*  BUN 14 15  CREATININE 0.86 0.92  CALCIUM 9.0 8.1*   PT/INR  Recent Labs  09/11/13 0500 09/12/13 0330  LABPROT 23.9* 23.2*  INR 2.22* 2.14*   ABG No results found for this basename: PHART, PCO2, PO2, HCO3,  in the last 72 hours  Studies/Results: Ct Abdomen Pelvis Wo Contrast  09/11/2013   CLINICAL DATA:  Small bowel obstruction in a patient with a history of endometrial and breast cancer.  EXAM: CT ABDOMEN AND PELVIS WITHOUT CONTRAST  TECHNIQUE: Multidetector CT imaging of the abdomen and pelvis was performed following the standard protocol without intravenous contrast.  COMPARISON:  09/14/2011  FINDINGS: Study was specifically ordered without intravenous contrast material.  Subtle small focus of micro nodularity identified in the extreme right lung base. There is dependent collapse/ consolidation in the posterior left lower lobe with a tiny left pleural effusion.  No focal abnormality is seen in the liver on this study without IV contrast material. Granulomata noted in the splenic  parenchyma but no associated splenomegaly. No enlargement of the portal vein or venous collateralization in the abdomen to suggest portal venous hypertension.  The stomach, duodenum, pancreas, and adrenal glands are unremarkable. Gallbladder is surgically absent. Cortical scarring is identified in both kidneys, left greater than right. No hydronephrosis or renal stones.  Dense atherosclerotic calcification noted in the wall of the abdominal aorta without aneurysm.  Multiple adhesions are identified in the abdomen and upper pelvis. There are distended small bowel loops interspersed with areas of nondistended small bowel. The distended loops measure up to about 3.5 cm in maximum diameter. 1 of the more distended small bowel loops leads into a long transition zone followed by nondilated small bowel and this is well visualized on image 59 of series 2. The bowel wall in the transition area appears circumferentially thickened to a mild degree. This loop bears a striking similarity to a bowel loop visualized on the previous study 09/24/2011 (see image 62 of series 2 on that exam). There was a more modest degree of bowel wall thickening associated with this transition area on the previous study. Contrast material does flow beyond this transition into the decompressed more distal small bowel loops. There is contrast material visualized in the colon.  Imaging through the pelvis shows a trace amount of intraperitoneal free fluid. No pelvic sidewall lymphadenopathy. No adnexal mass. Diverticular changes are noted in the left colon without diverticulitis. Terminal ileum and appendix have normal imaging features.  Bone windows reveal no  worrisome lytic or sclerotic osseous lesions.  IMPRESSION: Numerous adhesions in the abdomen and upper pelvis in this patient status post hysterectomy and radiation therapy for endometrial carcinoma. CT imaging features today all consistent with partial small bowel obstruction. There is a long  transition zone from distended small bowel measuring about 3.5 cm down to nondistended small bowel and this area shows mild circumferential wall thickening through the transition. Overall appearance of the transition zone is strikingly similar to the appearance on the 09/14/2011 exam and given the lack of change in small appearance/orientation between the 2 exams, adhesions are almost certainly involved. The mild circumferential wall thickening today may be secondary to an associated degree of infectious or inflammatory enteritis. Given the relatively remote timing of the endometrial cancer and the smooth circumferential wall thickening today, metastatic involvement of the small bowel is considered unlikely. Of particular note, the oral contrast on today's study has migrated through this transition zone into the more distal small bowel and colon.   Electronically Signed   By: Misty Stanley M.D.   On: 09/11/2013 10:23   Dg Abd Acute W/chest  09/10/2013   CLINICAL DATA:  Abdominal pain.  EXAM: ACUTE ABDOMEN SERIES (ABDOMEN 2 VIEW & CHEST 1 VIEW)  COMPARISON:  03/23/2012  FINDINGS: Left approach pacer wires are in stable orientation. Chronic cardiomegaly. Chronic small volume pleural effusion or pleural thickening at the left base. No edema or infiltrate. No effusion or pneumothorax. Rounded calcification in the right thoracic inlet this thyroidal based on CT 03/21/2011.  Dilated small bowel in the left abdomen, with relative paucity of colonic gas. There is mild fold thickening of the same segments. No evidence of bowel perforation.  No evidence of urolithiasis. Cholecystectomy changes. Diffuse spondylosis.  IMPRESSION: Dilated small bowel with mild fold thickening - most likely bowel obstruction.   Electronically Signed   By: Jorje Guild M.D.   On: 09/10/2013 22:48    Anti-infectives: Anti-infectives   None      Assessment/Plan: s/p * No surgery found * No signs or SBO. Start clears Pt with  thickening of SB, no overt obstruction at this time and no need for urgent surgical tx. Would rec f/u with GI  LOS: 2 days    Rosario Jacks., [employer] 09/12/2013

## 2013-09-12 NOTE — Progress Notes (Signed)
ANTICOAGULATION CONSULT NOTE - Follow up [place] for Coumadin Indication: atrial fibrillation  Allergies  Allergen Reactions  . Other     Contrast dye   . Penicillins Itching  . Shellfish-Derived Products Nausea And Vomiting  . Statins     Myalgias,elevated LFT studies  . Iohexol Hives and Itching    Itching in eyes, one hive on face.    Patient Measurements: Height: 5\' 7"  (170.2 cm) Weight: 201 lb 8 oz (91.4 kg) IBW/kg (Calculated) : 61.6  Vital Signs: Temp: 98 F (36.7 C) (01/06 0521) Temp src: Oral (01/06 0521) BP: 154/65 mmHg (01/06 0521) Pulse Rate: 83 (01/06 0521)  Labs:  Recent Labs  09/10/13 2109 09/10/13 2119 09/11/13 0440 09/11/13 0500 09/12/13 0330  HGB  --  13.2 11.5*  --   --   HCT  --  39.3 35.8*  --   --   PLT  --  220 185  --   --   LABPROT 22.8*  --   --  23.9* 23.2*  INR 2.09*  --   --  2.22* 2.14*  CREATININE  --  0.86 0.92  --   --     Estimated Creatinine Clearance: 60.4 ml/min (by C-G formula based on Cr of 0.92).    Assessment: 79yo female c/o constipation since Friday, has h/o SBO and concerned for new SBO, Xray suggests obstruction, to continue Coumadin for Afib; admitted with therapeutic INR at low end of goal though had INR of 4 at anticoag clinic on 1/2 (likely b/c pt was taking cranberry pills) and had held at least one dose. INR today at lower end of goal at 2.14. No bleeding reported by patient.   Home coumadin dose: 0.5 mg on Mon, Fri; 1 mg on all other days   Goal of Therapy:  INR 2-3   Plan:  1) Coumadin 2 mg x 1 dose today. 2) Monitor daily PT/INR  3) Monitor for s/s of bleeding   Albertina Parr, PharmD.  Clinical Pharmacist Pager [phone]

## 2013-09-12 NOTE — Care Management Note (Unsigned)
    Page 1 of 1   09/12/2013     3:13:01 PM   CARE MANAGEMENT NOTE 09/12/2013  Patient:  Yolanda Spears, Yolanda Spears   Account Number:  [id number]  Date Initiated:  09/12/2013  Documentation initiated by:  Jadalynn Burr  Subjective/Objective Assessment:   PT ADM ON 09/10/13 WITH SBO, NOW RESOLVED.  PTA, PT INDEPENDENT, LIVES ALONE.     Action/Plan:   WILL FOLLOW FOR DC NEEDS AS PT PROGRESSES.   Anticipated DC Date:  09/14/2013   Anticipated DC Plan:  [place]  CM consult      Choice offered to / List presented to:             Status of service:  In process, will continue to follow Medicare Important Message given?   (If response is "NO", the following Medicare IM given date fields will be blank) Date Medicare IM given:   Date Additional Medicare IM given:    Discharge Disposition:    Per UR Regulation:  Reviewed for med. necessity/level of care/duration of stay  If discussed at [place] of Stay Meetings, dates discussed:    Comments:

## 2013-09-12 NOTE — Discharge Instructions (Signed)

## 2013-09-12 NOTE — Progress Notes (Signed)
[facility] PROGRESS NOTE  Yolanda Spears ZOX:[id number] DOB: 09-21-1934 DOA: 09/10/2013 PCP: Thressa Sheller, MD  Brief narrative: 79 year old female with past medical history of A fib on coumadin, endometroal carcinoma nd status post RT and hysterectomy who presented to [facility] ED 09/11/2103 with worsening abdominal pain, N/V and poor oral intake for past 2-3 days prior to this admission. Pt was subsequently found to have SBO based on abdominal x ray and CT abd. She is being treated conservatively per surgery recommendations.   Assessment/Plan:   Principal Problem:  SBO (small bowel obstruction)  - secondary to adhesion status post hysterectomy, RT for endometrial carcinoma  - conservative management per surgery; pt does not have NG tube  - clear liquids today  - pain management with dilaudid 0.5 mg IV every 3 hours PRN severe pain  - continue current IV fluids   Active Problems:  Atrial fibrillation  - on coumadin Per pharmacy  - also takes Rythmol every 8 hours (propafenone)   Code Status: full code  Family Communication: no family at the bedside  Disposition Plan: home when stable   Consultants:  Surgery  Procedures:  None  Antibiotics:  None    Leisa Lenz, MD  [facility] Pager [phone]  If 7PM-7AM, please contact night-coverage [email or web address] Password [email or web address] 09/12/2013, 2:44 PM   LOS: 2 days    HPI/Subjective: Feels better this am.  Objective: Filed Vitals:   09/11/13 0444 09/11/13 1350 09/11/13 2006 09/12/13 0521  BP: 149/55 157/61 145/59 154/65  Pulse: 70 74 66 83  Temp: 98.7 F (37.1 C) 98 F (36.7 C) 97.8 F (36.6 C) 98 F (36.7 C)  TempSrc: Oral Oral Oral Oral  Resp: 18 18 18 20   Height:      Weight:      SpO2: 98% 97% 97% 95%    Intake/Output Summary (Last 24 hours) at 09/12/13 1444 Last data filed at 09/12/13 1100  Gross per 24 hour  Intake 1473.75 ml  Output      0 ml  Net 1473.75 ml    Exam:  General: Pt is alert, follows  commands appropriately, not in acute distress  Cardiovascular: irregular rhythm, rate controlled, S1/S2 appreciated  Respiratory: Clear to auscultation bilaterally, no wheezing, no crackles, no rhonchi  Abdomen: Soft, bowel sounds present, no guarding  Extremities: No edema, pulses DP and PT palpable bilaterally  Neuro: Grossly nonfocal   Data Reviewed: Basic Metabolic Panel:  Recent Labs Lab 09/10/13 2119 09/11/13 0440  NA 143 140  K 4.1 4.2  CL 103 104  CO2 26 26  GLUCOSE 119* 105*  BUN 14 15  CREATININE 0.86 0.92  CALCIUM 9.0 8.1*   Liver Function Tests:  Recent Labs Lab 09/10/13 2119 09/11/13 0440  AST 22 118*  ALT 18 60*  ALKPHOS 73 82  BILITOT 0.8 1.6*  PROT 7.3 6.1  ALBUMIN 3.3* 2.8*   No results found for this basename: LIPASE, AMYLASE,  in the last 168 hours No results found for this basename: AMMONIA,  in the last 168 hours CBC:  Recent Labs Lab 09/10/13 2119 09/11/13 0440  WBC 8.2 8.1  NEUTROABS 6.6  --   HGB 13.2 11.5*  HCT 39.3 35.8*  MCV 90.6 91.3  PLT 220 185   Cardiac Enzymes: No results found for this basename: CKTOTAL, CKMB, CKMBINDEX, TROPONINI,  in the last 168 hours BNP: No components found with this basename: POCBNP,  CBG: No results found for this basename: GLUCAP,  in the last 168 hours  Recent Results (from the past 240 hour(s))  URINE CULTURE     Status: None   Collection Time    09/11/13 12:02 AM      Result Value Range Status   Specimen Description URINE, CLEAN CATCH   Final   Special Requests ADDED [phone]   Final   Culture  Setup Time     Final   Value: 09/11/2013 07:07     Performed at [facility]     Final   Value: >=100,000 COLONIES/ML     Performed at [facility]   Culture     Final   Value: Multiple bacterial morphotypes present, none predominant. Suggest appropriate recollection if clinically indicated.     Performed at [facility]   Report Status 09/12/2013 FINAL    Final     Studies: Ct Abdomen Pelvis Wo Contrast  09/11/2013   CLINICAL DATA:  Small bowel obstruction in a patient with a history of endometrial and breast cancer.  EXAM: CT ABDOMEN AND PELVIS WITHOUT CONTRAST  TECHNIQUE: Multidetector CT imaging of the abdomen and pelvis was performed following the standard protocol without intravenous contrast.  COMPARISON:  09/14/2011  FINDINGS: Study was specifically ordered without intravenous contrast material.  Subtle small focus of micro nodularity identified in the extreme right lung base. There is dependent collapse/ consolidation in the posterior left lower lobe with a tiny left pleural effusion.  No focal abnormality is seen in the liver on this study without IV contrast material. Granulomata noted in the splenic parenchyma but no associated splenomegaly. No enlargement of the portal vein or venous collateralization in the abdomen to suggest portal venous hypertension.  The stomach, duodenum, pancreas, and adrenal glands are unremarkable. Gallbladder is surgically absent. Cortical scarring is identified in both kidneys, left greater than right. No hydronephrosis or renal stones.  Dense atherosclerotic calcification noted in the wall of the abdominal aorta without aneurysm.  Multiple adhesions are identified in the abdomen and upper pelvis. There are distended small bowel loops interspersed with areas of nondistended small bowel. The distended loops measure up to about 3.5 cm in maximum diameter. 1 of the more distended small bowel loops leads into a long transition zone followed by nondilated small bowel and this is well visualized on image 59 of series 2. The bowel wall in the transition area appears circumferentially thickened to a mild degree. This loop bears a striking similarity to a bowel loop visualized on the previous study 09/24/2011 (see image 62 of series 2 on that exam). There was a more modest degree of bowel wall thickening associated with this  transition area on the previous study. Contrast material does flow beyond this transition into the decompressed more distal small bowel loops. There is contrast material visualized in the colon.  Imaging through the pelvis shows a trace amount of intraperitoneal free fluid. No pelvic sidewall lymphadenopathy. No adnexal mass. Diverticular changes are noted in the left colon without diverticulitis. Terminal ileum and appendix have normal imaging features.  Bone windows reveal no worrisome lytic or sclerotic osseous lesions.  IMPRESSION: Numerous adhesions in the abdomen and upper pelvis in this patient status post hysterectomy and radiation therapy for endometrial carcinoma. CT imaging features today all consistent with partial small bowel obstruction. There is a long transition zone from distended small bowel measuring about 3.5 cm down to nondistended small bowel and this area shows mild circumferential wall thickening through the transition. Overall appearance of the transition zone  is strikingly similar to the appearance on the 09/14/2011 exam and given the lack of change in small appearance/orientation between the 2 exams, adhesions are almost certainly involved. The mild circumferential wall thickening today may be secondary to an associated degree of infectious or inflammatory enteritis. Given the relatively remote timing of the endometrial cancer and the smooth circumferential wall thickening today, metastatic involvement of the small bowel is considered unlikely. Of particular note, the oral contrast on today's study has migrated through this transition zone into the more distal small bowel and colon.   Electronically Signed   By: Misty Stanley M.D.   On: 09/11/2013 10:23   Dg Abd Acute W/chest  09/10/2013   CLINICAL DATA:  Abdominal pain.  EXAM: ACUTE ABDOMEN SERIES (ABDOMEN 2 VIEW & CHEST 1 VIEW)  COMPARISON:  03/23/2012  FINDINGS: Left approach pacer wires are in stable orientation. Chronic  cardiomegaly. Chronic small volume pleural effusion or pleural thickening at the left base. No edema or infiltrate. No effusion or pneumothorax. Rounded calcification in the right thoracic inlet this thyroidal based on CT 03/21/2011.  Dilated small bowel in the left abdomen, with relative paucity of colonic gas. There is mild fold thickening of the same segments. No evidence of bowel perforation.  No evidence of urolithiasis. Cholecystectomy changes. Diffuse spondylosis.  IMPRESSION: Dilated small bowel with mild fold thickening - most likely bowel obstruction.   Electronically Signed   By: Jorje Guild M.D.   On: 09/10/2013 22:48    Scheduled Meds: . pantoprazole (PROTONIX) IV  40 mg Intravenous Q24H  . propafenone  225 mg Per NG tube Q8H  . sodium chloride  3 mL Intravenous Q12H  . warfarin  2 mg Oral ONCE-1800  . Warfarin - Pharmacist Dosing Inpatient   Does not apply q1800   Continuous Infusions: . 0.9 % NaCl with KCl 20 mEq / L 50 mL/hr at 09/12/13 1031

## 2013-09-13 DIAGNOSIS — I10 Essential (primary) hypertension: Secondary | ICD-10-CM

## 2013-09-13 LAB — PROTIME-INR
INR: 3.23 — ABNORMAL HIGH (ref 0.00–1.49)
Prothrombin Time: 31.8 seconds — ABNORMAL HIGH (ref 11.6–15.2)

## 2013-09-13 MED ORDER — FUROSEMIDE 20 MG PO TABS
20.0000 mg | ORAL_TABLET | Freq: Every day | ORAL | Status: DC
  Administered 2013-09-13: 20 mg via ORAL
  Filled 2013-09-13: qty 1

## 2013-09-13 MED ORDER — METOPROLOL SUCCINATE ER 100 MG PO TB24
100.0000 mg | ORAL_TABLET | Freq: Every day | ORAL | Status: DC
  Administered 2013-09-13: 100 mg via ORAL
  Filled 2013-09-13: qty 1

## 2013-09-13 MED ORDER — LISINOPRIL 10 MG PO TABS
10.0000 mg | ORAL_TABLET | Freq: Two times a day (BID) | ORAL | Status: DC
  Administered 2013-09-13: 10 mg via ORAL
  Filled 2013-09-13 (×2): qty 1

## 2013-09-13 MED ORDER — PANTOPRAZOLE SODIUM 40 MG PO TBEC
40.0000 mg | DELAYED_RELEASE_TABLET | Freq: Every day | ORAL | Status: DC
  Administered 2013-09-13: 40 mg via ORAL
  Filled 2013-09-13 (×2): qty 1

## 2013-09-13 MED ORDER — HYDRALAZINE HCL 25 MG PO TABS
25.0000 mg | ORAL_TABLET | Freq: Two times a day (BID) | ORAL | Status: DC
  Administered 2013-09-13: 25 mg via ORAL
  Filled 2013-09-13 (×3): qty 1

## 2013-09-13 NOTE — Plan of Care (Signed)
Problem: Food- and Nutrition-Related Knowledge Deficit (NB-1.1) Goal: Nutrition education Formal process to instruct or train a patient/client in a skill or to impart knowledge to help patients/clients voluntarily manage or modify food choices and eating behavior to maintain or improve health. Outcome: Completed/Met Date Met:  09/13/13  RD consulted for diet education.  RD provided "General, Healthful Nutrition Therapy" handout from the Academy of [employer]etics.  Body mass index is 31.55 kg/(m^2). Pt meets criteria for Obesity Class I based on current BMI.    Wt Readings from Last 3 Encounters:  09/11/13 201 lb 8 oz (91.4 kg)  07/31/13 206 lb (93.441 kg)  05/18/13 221 lb (100.245 kg)    Current diet order is Dysphagia 3-thin liquids, patient's average meal consumption is 85% at this time.  Labs and medications reviewed.   No nutrition interventions warranted at this time. If nutrition issues arise, please consult RD.   Arthur Holms, RD, LDN Pager #: [phone] After-Hours Pager #: [phone]

## 2013-09-13 NOTE — Progress Notes (Signed)
ANTICOAGULATION CONSULT NOTE - Follow up [place] for Coumadin Indication: atrial fibrillation  Allergies  Allergen Reactions  . Other     Contrast dye   . Penicillins Itching  . Shellfish-Derived Products Nausea And Vomiting  . Statins     Myalgias,elevated LFT studies  . Iohexol Hives and Itching    Itching in eyes, one hive on face.    Patient Measurements: Height: 5\' 7"  (170.2 cm) Weight: 201 lb 8 oz (91.4 kg) IBW/kg (Calculated) : 61.6  Vital Signs: Temp: 98.2 F (36.8 C) (01/07 0520) Temp src: Oral (01/07 0520) BP: 181/63 mmHg (01/07 0520) Pulse Rate: 83 (01/07 0520)  Labs:  Recent Labs  09/10/13 2119 09/11/13 0440 09/11/13 0500 09/12/13 0330 09/13/13 0315  HGB 13.2 11.5*  --   --   --   HCT 39.3 35.8*  --   --   --   PLT 220 185  --   --   --   LABPROT  --   --  23.9* 23.2* 31.8*  INR  --   --  2.22* 2.14* 3.23*  CREATININE 0.86 0.92  --   --   --     Estimated Creatinine Clearance: 60.4 ml/min (by C-G formula based on Cr of 0.92).    Assessment: 79yo female c/o constipation since Friday, has h/o SBO and concerned for new SBO, Xray suggests obstruction, to continue Coumadin for Afib; admitted with therapeutic INR at low end of goal though had INR of 4 at anticoag clinic on 1/2 (likely b/c pt was taking cranberry pills) and had held at least one dose.INR has increased significantly today from 2.14>>3.23. No bleeding reported by patient.   Home coumadin dose: 0.5 mg on Mon, Fri; 1 mg on all other days   Goal of Therapy:  INR 2-3   Plan:  1) Hold Coumadin today  2) Monitor daily PT/INR  3) Monitor for s/s of bleeding   Albertina Parr, PharmD.  Clinical Pharmacist Pager [phone]

## 2013-09-13 NOTE — Progress Notes (Addendum)
Pt to d/c home this afternoon; Spoke with MD and pharmacy; pt to hold dose of Coumadin tonight and resume home doses tomorrow evening; pt verbalized understanding; will cont. To monitor.

## 2013-09-13 NOTE — Progress Notes (Signed)
Pt given d/c instructions; IV and tele monitor d/c at this time; son to pick pt up; will cont. To monitor.

## 2013-09-13 NOTE — Progress Notes (Signed)
Subjective: Pt tol reg diet. Havign bowel function  Objective: Vital signs in last 24 hours: Temp:  [97.6 F (36.4 C)-98.2 F (36.8 C)] 98.2 F (36.8 C) (01/07 0520) Pulse Rate:  [83-87] 83 (01/07 0520) Resp:  [18-20] 20 (01/07 0520) BP: (163-194)/(60-78) 181/63 mmHg (01/07 0520) SpO2:  [94 %-97 %] 94 % (01/07 0520) Last BM Date: 09/12/13  Intake/Output from previous day: 01/06 0701 - 01/07 0700 In: 2693.7 [P.O.:1560; I.V.:1133.7] Out: -  Intake/Output this shift: Total I/O In: 133.3 [I.V.:133.3] Out: -   General appearance: alert and cooperative GI: soft, non-tender; bowel sounds normal; no masses,  no organomegaly  Lab Results:   Recent Labs  09/10/13 2119 09/11/13 0440  WBC 8.2 8.1  HGB 13.2 11.5*  HCT 39.3 35.8*  PLT 220 185   BMET  Recent Labs  09/10/13 2119 09/11/13 0440  NA 143 140  K 4.1 4.2  CL 103 104  CO2 26 26  GLUCOSE 119* 105*  BUN 14 15  CREATININE 0.86 0.92  CALCIUM 9.0 8.1*   PT/INR  Recent Labs  09/12/13 0330 09/13/13 0315  LABPROT 23.2* 31.8*  INR 2.14* 3.23*   ABG No results found for this basename: PHART, PCO2, PO2, HCO3,  in the last 72 hours  Studies/Results: Ct Abdomen Pelvis Wo Contrast  09/11/2013   CLINICAL DATA:  Small bowel obstruction in a patient with a history of endometrial and breast cancer.  EXAM: CT ABDOMEN AND PELVIS WITHOUT CONTRAST  TECHNIQUE: Multidetector CT imaging of the abdomen and pelvis was performed following the standard protocol without intravenous contrast.  COMPARISON:  09/14/2011  FINDINGS: Study was specifically ordered without intravenous contrast material.  Subtle small focus of micro nodularity identified in the extreme right lung base. There is dependent collapse/ consolidation in the posterior left lower lobe with a tiny left pleural effusion.  No focal abnormality is seen in the liver on this study without IV contrast material. Granulomata noted in the splenic parenchyma but no associated  splenomegaly. No enlargement of the portal vein or venous collateralization in the abdomen to suggest portal venous hypertension.  The stomach, duodenum, pancreas, and adrenal glands are unremarkable. Gallbladder is surgically absent. Cortical scarring is identified in both kidneys, left greater than right. No hydronephrosis or renal stones.  Dense atherosclerotic calcification noted in the wall of the abdominal aorta without aneurysm.  Multiple adhesions are identified in the abdomen and upper pelvis. There are distended small bowel loops interspersed with areas of nondistended small bowel. The distended loops measure up to about 3.5 cm in maximum diameter. 1 of the more distended small bowel loops leads into a long transition zone followed by nondilated small bowel and this is well visualized on image 59 of series 2. The bowel wall in the transition area appears circumferentially thickened to a mild degree. This loop bears a striking similarity to a bowel loop visualized on the previous study 09/24/2011 (see image 62 of series 2 on that exam). There was a more modest degree of bowel wall thickening associated with this transition area on the previous study. Contrast material does flow beyond this transition into the decompressed more distal small bowel loops. There is contrast material visualized in the colon.  Imaging through the pelvis shows a trace amount of intraperitoneal free fluid. No pelvic sidewall lymphadenopathy. No adnexal mass. Diverticular changes are noted in the left colon without diverticulitis. Terminal ileum and appendix have normal imaging features.  Bone windows reveal no worrisome lytic or sclerotic osseous  lesions.  IMPRESSION: Numerous adhesions in the abdomen and upper pelvis in this patient status post hysterectomy and radiation therapy for endometrial carcinoma. CT imaging features today all consistent with partial small bowel obstruction. There is a long transition zone from distended  small bowel measuring about 3.5 cm down to nondistended small bowel and this area shows mild circumferential wall thickening through the transition. Overall appearance of the transition zone is strikingly similar to the appearance on the 09/14/2011 exam and given the lack of change in small appearance/orientation between the 2 exams, adhesions are almost certainly involved. The mild circumferential wall thickening today may be secondary to an associated degree of infectious or inflammatory enteritis. Given the relatively remote timing of the endometrial cancer and the smooth circumferential wall thickening today, metastatic involvement of the small bowel is considered unlikely. Of particular note, the oral contrast on today's study has migrated through this transition zone into the more distal small bowel and colon.   Electronically Signed   By: Misty Stanley M.D.   On: 09/11/2013 10:23    Anti-infectives: Anti-infectives   None      Assessment/Plan: Resolved pSBO Reg diet F/u as needed with surgery Should have f/u with Dr. Hilarie Fredrickson for possible endoscopy Call if needed.  LOS: 3 days    Yolanda Spears., [facility] 09/13/2013

## 2013-09-13 NOTE — Discharge Summary (Signed)
Physician Discharge Summary  Yolanda Spears:[id number] DOB: 05-Jul-1935 DOA: 09/10/2013  PCP: Thressa Sheller, MD  Admit date: 09/10/2013 Discharge date: 09/13/2013  Time spent: 40 minutes  Recommendations for Outpatient Follow-up:  1. Follow up with PCP within two weeks. 2. Follow up with gastroenterology in next 2-4 weeks.  Discharge Diagnoses:  Principal Problem:   SBO (small bowel obstruction) Active Problems:   Atrial fibrillation   Pacemaker- Dual-chamber-mdt   TIA (transient ischemic attack)   Discharge Condition: good  Diet recommendation: advance as tolerated.  Filed Weights   09/11/13 0128  Weight: 91.4 kg (201 lb 8 oz)    History of present illness:  79 y.o. female with Past medical history of atrial fibrillation, biventricular pacemaker, hypertension, history of partial small bowel obstruction, breast cancer, endometrial cancer.  The patient presents with complaints of abdominal pain that has been ongoing since last 3 days.    Hospital Course:  1. Partial small bowel obstruction: Treated conservatively with fluids, diet restriction, pain management.  No NG tube or other intervention.  At the time of discharge is tolerating diet, passing stool.  Surgery followed in hospital.  Will need endoscopy in the next few months to evaluate. 2. Hypertension: Hypertensive off of home BP regimen.  Improved once back on regimen with no changes. 3. Atrial fibrillation: rate controled. On coumadin.  INR elevated today, will need to follow up with her outpatient INR lab.  Procedures:  CT abd pelvis 09/11/13  Consultations:  Surgery Dr. Rosendo Gros  Discharge Exam: Filed Vitals:   09/13/13 1057  BP: 180/73  Pulse: 75  Temp:   Resp:     General: NAD Cardiovascular: irreg, rate controled, no MRG noted Respiratory: CTAB, shallow, poor effort ABD: BS positive, soft, distended obese, non tender  Discharge Instructions  Discharge Orders   Future Appointments Provider  Department Dept Phone   09/18/2013 1:45 PM Kendell Bane, [place] [facility] [place]   09/22/2013 2:00 PM [facility] [place] [facility] [phone]   09/29/2013 10:10 AM [facility] [place] [facility] [phone]   11/29/2013 2:00 PM Darlin Coco, MD [place] [facility] [phone]   Future Orders Complete By Expires   Call MD for:  persistant nausea and vomiting  As directed    Call MD for:  severe uncontrolled pain  As directed    Call MD for:  temperature >100.4  As directed    Diet - low sodium heart healthy  As directed    Discharge instructions  As directed    Comments:     Advance diet slowly.  Be sure to drink plenty of fluids.  Be sure to walk and be as mobile as possible to aid bowel motility.   Increase activity slowly  As directed        Medication List         aspirin 81 MG tablet  Take 81 mg by mouth daily.     calcium carbonate 500 MG chewable tablet  Commonly known as:  TUMS - dosed in mg elemental calcium  Chew 1 tablet by mouth daily.     ezetimibe 10 MG tablet  Commonly known as:  ZETIA  Take 0.5 tablets (5 mg total) by mouth daily.     furosemide 20 MG tablet  Commonly known as:  LASIX  Take 20 mg by mouth daily.     hydrALAZINE 25 MG tablet  Commonly known as:  APRESOLINE  Take 25  mg by mouth 2 (two) times daily.     lisinopril 10 MG tablet  Commonly known as:  PRINIVIL,ZESTRIL  Take 10 mg by mouth 2 (two) times daily.     METANX PO  Take 1 tablet by mouth daily.     metoprolol succinate 100 MG 24 hr tablet  Commonly known as:  TOPROL-XL  Take 1 tablet (100 mg total) by mouth daily. Take with or immediately following a meal.     pregabalin 75 MG capsule  Commonly known as:  LYRICA  Take 75 mg by mouth 2 (two) times daily.     propafenone 225 MG tablet  Commonly known as:  RYTHMOL  Take 1 tablet (225 mg total) by mouth every 8 (eight) hours.      Vitamin D-3 5000 UNITS Tabs  Take 1 tablet by mouth 4 (four) times a week. Takes on Saturday, Sunday, Tuesday and Thursday     warfarin 2.5 MG tablet  Commonly known as:  COUMADIN  Take 1.25-2.5 mg by mouth See admin instructions. Take 0.5 tablet on Monday and Friday. Take 1 tablet all other days.       Allergies  Allergen Reactions  . Other     Contrast dye   . Penicillins Itching  . Shellfish-Derived Products Nausea And Vomiting  . Statins     Myalgias,elevated LFT studies  . Iohexol Hives and Itching    Itching in eyes, one hive on face.       Follow-up Information   Follow up with PYRTLE, Lajuan Lines, MD In 2 weeks.   Specialty:  Gastroenterology   Contact information:   [address] [place] [address] [phone]       Follow up with Thressa Sheller, MD In 2 weeks.   Specialty:  Internal Medicine   Contact information:   [place], [place] [address] [phone]        The results of significant diagnostics from this hospitalization (including imaging, microbiology, ancillary and laboratory) are listed below for reference.    Significant Diagnostic Studies: Ct Abdomen Pelvis Wo Contrast  09/11/2013   CLINICAL DATA:  Small bowel obstruction in a patient with a history of endometrial and breast cancer.  EXAM: CT ABDOMEN AND PELVIS WITHOUT CONTRAST  TECHNIQUE: Multidetector CT imaging of the abdomen and pelvis was performed following the standard protocol without intravenous contrast.  COMPARISON:  09/14/2011  FINDINGS: Study was specifically ordered without intravenous contrast material.  Subtle small focus of micro nodularity identified in the extreme right lung base. There is dependent collapse/ consolidation in the posterior left lower lobe with a tiny left pleural effusion.  No focal abnormality is seen in the liver on this study without IV contrast material. Granulomata noted in the splenic parenchyma but no associated splenomegaly. No  enlargement of the portal vein or venous collateralization in the abdomen to suggest portal venous hypertension.  The stomach, duodenum, pancreas, and adrenal glands are unremarkable. Gallbladder is surgically absent. Cortical scarring is identified in both kidneys, left greater than right. No hydronephrosis or renal stones.  Dense atherosclerotic calcification noted in the wall of the abdominal aorta without aneurysm.  Multiple adhesions are identified in the abdomen and upper pelvis. There are distended small bowel loops interspersed with areas of nondistended small bowel. The distended loops measure up to about 3.5 cm in maximum diameter. 1 of the more distended small bowel loops leads into a long transition zone followed by nondilated small bowel and this is  well visualized on image 59 of series 2. The bowel wall in the transition area appears circumferentially thickened to a mild degree. This loop bears a striking similarity to a bowel loop visualized on the previous study 09/24/2011 (see image 62 of series 2 on that exam). There was a more modest degree of bowel wall thickening associated with this transition area on the previous study. Contrast material does flow beyond this transition into the decompressed more distal small bowel loops. There is contrast material visualized in the colon.  Imaging through the pelvis shows a trace amount of intraperitoneal free fluid. No pelvic sidewall lymphadenopathy. No adnexal mass. Diverticular changes are noted in the left colon without diverticulitis. Terminal ileum and appendix have normal imaging features.  Bone windows reveal no worrisome lytic or sclerotic osseous lesions.  IMPRESSION: Numerous adhesions in the abdomen and upper pelvis in this patient status post hysterectomy and radiation therapy for endometrial carcinoma. CT imaging features today all consistent with partial small bowel obstruction. There is a long transition zone from distended small bowel  measuring about 3.5 cm down to nondistended small bowel and this area shows mild circumferential wall thickening through the transition. Overall appearance of the transition zone is strikingly similar to the appearance on the 09/14/2011 exam and given the lack of change in small appearance/orientation between the 2 exams, adhesions are almost certainly involved. The mild circumferential wall thickening today may be secondary to an associated degree of infectious or inflammatory enteritis. Given the relatively remote timing of the endometrial cancer and the smooth circumferential wall thickening today, metastatic involvement of the small bowel is considered unlikely. Of particular note, the oral contrast on today's study has migrated through this transition zone into the more distal small bowel and colon.   Electronically Signed   By: Misty Stanley M.D.   On: 09/11/2013 10:23   Dg Abd Acute W/chest  09/10/2013   CLINICAL DATA:  Abdominal pain.  EXAM: ACUTE ABDOMEN SERIES (ABDOMEN 2 VIEW & CHEST 1 VIEW)  COMPARISON:  03/23/2012  FINDINGS: Left approach pacer wires are in stable orientation. Chronic cardiomegaly. Chronic small volume pleural effusion or pleural thickening at the left base. No edema or infiltrate. No effusion or pneumothorax. Rounded calcification in the right thoracic inlet this thyroidal based on CT 03/21/2011.  Dilated small bowel in the left abdomen, with relative paucity of colonic gas. There is mild fold thickening of the same segments. No evidence of bowel perforation.  No evidence of urolithiasis. Cholecystectomy changes. Diffuse spondylosis.  IMPRESSION: Dilated small bowel with mild fold thickening - most likely bowel obstruction.   Electronically Signed   By: Jorje Guild M.D.   On: 09/10/2013 22:48    Microbiology: Recent Results (from the past 240 hour(s))  URINE CULTURE     Status: None   Collection Time    09/11/13 12:02 AM      Result Value Range Status   Specimen  Description URINE, CLEAN CATCH   Final   Special Requests ADDED 0039   Final   Culture  Setup Time     Final   Value: 09/11/2013 07:07     Performed at [facility]     Final   Value: >=100,000 COLONIES/ML     Performed at [facility]   Culture     Final   Value: Multiple bacterial morphotypes present, none predominant. Suggest appropriate recollection if clinically indicated.     Performed at [facility]  Report Status 09/12/2013 FINAL   Final     Labs: Basic Metabolic Panel:  Recent Labs Lab 09/10/13 2119 09/11/13 0440  NA 143 140  K 4.1 4.2  CL 103 104  CO2 26 26  GLUCOSE 119* 105*  BUN 14 15  CREATININE 0.86 0.92  CALCIUM 9.0 8.1*   Liver Function Tests:  Recent Labs Lab 09/10/13 2119 09/11/13 0440  AST 22 118*  ALT 18 60*  ALKPHOS 73 82  BILITOT 0.8 1.6*  PROT 7.3 6.1  ALBUMIN 3.3* 2.8*   No results found for this basename: LIPASE, AMYLASE,  in the last 168 hours No results found for this basename: AMMONIA,  in the last 168 hours CBC:  Recent Labs Lab 09/10/13 2119 09/11/13 0440  WBC 8.2 8.1  NEUTROABS 6.6  --   HGB 13.2 11.5*  HCT 39.3 35.8*  MCV 90.6 91.3  PLT 220 185   Cardiac Enzymes: No results found for this basename: CKTOTAL, CKMB, CKMBINDEX, TROPONINI,  in the last 168 hours BNP: BNP (last 3 results) No results found for this basename: PROBNP,  in the last 8760 hours CBG: No results found for this basename: GLUCAP,  in the last 168 hours     Signed:  Denna Fryberger  [facility] 09/13/2013, 12:17 PM

## 2013-09-18 ENCOUNTER — Ambulatory Visit: Payer: Medicare Other | Admitting: Podiatry

## 2013-09-19 ENCOUNTER — Encounter: Payer: Self-pay | Admitting: Internal Medicine

## 2013-09-20 ENCOUNTER — Ambulatory Visit (INDEPENDENT_AMBULATORY_CARE_PROVIDER_SITE_OTHER): Payer: Medicare Other | Admitting: Internal Medicine

## 2013-09-20 ENCOUNTER — Encounter: Payer: Self-pay | Admitting: Internal Medicine

## 2013-09-20 VITALS — BP 132/68 | HR 77 | Ht 67.0 in | Wt 205.0 lb

## 2013-09-20 DIAGNOSIS — R112 Nausea with vomiting, unspecified: Secondary | ICD-10-CM

## 2013-09-20 DIAGNOSIS — K56609 Unspecified intestinal obstruction, unspecified as to partial versus complete obstruction: Secondary | ICD-10-CM

## 2013-09-20 DIAGNOSIS — K56600 Partial intestinal obstruction, unspecified as to cause: Secondary | ICD-10-CM

## 2013-09-20 DIAGNOSIS — R109 Unspecified abdominal pain: Secondary | ICD-10-CM

## 2013-09-20 DIAGNOSIS — R748 Abnormal levels of other serum enzymes: Secondary | ICD-10-CM

## 2013-09-20 MED ORDER — ONDANSETRON 4 MG PO TBDP: 4.0000 mg | ORAL_TABLET | Freq: Three times a day (TID) | ORAL | Status: DC | PRN

## 2013-09-20 MED ORDER — HYOSCYAMINE SULFATE 0.125 MG SL SUBL: 0.1250 mg | SUBLINGUAL_TABLET | SUBLINGUAL | Status: DC | PRN

## 2013-09-20 NOTE — Patient Instructions (Addendum)
We have sent the following medications to your pharmacy for you to pick up at your convenience: Zofran, Levsin.  You have been given a Low fiber diet.   You have been scheduled for an appointment with _________________ at [facility]. Your appointment is on __________________ at ____________. Please arrive at __________________ for registration. Make certain to bring a list of current medications, including any over the counter medications or vitamins. Also bring your co-pay if you have one as well as your insurance cards. [facility] is located at [address]. Should you need to reschedule your appointment, please contact them at [phone].

## 2013-09-20 NOTE — Progress Notes (Signed)
Subjective:    Patient ID: Yolanda Spears, female    DOB: 10-06-1934, 79 y.o.   MRN: [id number]  HPI Yolanda Spears is a 79 yo female with PMH of breast cancer in situ status post double mastectomy, endometrial cancer status post hysterectomy with radiation in 2006, atrial fibrillation on warfarin, colon polyps with last colonoscopy in 2011, TIAs, HTN, and recurrent partial small bowel obstructions who is seen in followup. She is here today with her son. She was last seen in June of 2013. After her last visit it was felt that she was having intermittent partial small bowel obstructions from adhesive disease. A small bowel follow-through performed after that appointment was unremarkable.   She reports another recent attack of partial small bowel obstruction several days after new years 2015.  This was associated with sudden onset of abdominal pain followed by nausea and vomiting. CT scan was performed which again showed partial small bowel obstruction felt to be secondary to adhesions. She reports the episode always ends with diarrhea. There was about 20 months between hospitalizations for partial small bowel obstructions, but she reports approximately 10 mini attacks which occur and she treats at home without seeking care. This is often described as abdominal pain and nausea with vomiting. She reduces her diet to clear liquids during these periods. She reports one such attack on 08/30/2013 with abdominal pain and vomiting for which she did not seek care. Her son feels like she probably should have.  Outside of these attacks she denies abdominal pain. She reports she is eating well, though her appetite fluctuates. She has not lost weight in fact she feels she has gained some weight. She reports occasional constipation but is having daily nonbloody nonbloody bowel movements. No significant heartburn. No dysphagia or odynophagia.  Of note with her episode in early January 2015 her son felt like she had  low-grade fevers.  She was given Levsin and Zofran after her last visit but never used this and is not sure she has these prescriptions at home  Review of Systems As per history of present illness, otherwise negative  Current Medications, Allergies, Past Medical History, Past Surgical History, Family History and Social History were reviewed in [employer] record.     Objective:   Physical Exam BP 132/68  Pulse 77  Ht 5\' 7"  (1.702 m)  Wt 205 lb (92.987 kg)  BMI 32.10 kg/m2 Constitutional: Well-developed and well-nourished. No distress. HEENT: Normocephalic and atraumatic. Oropharynx is clear and moist. No oropharyngeal exudate. Conjunctivae are normal.  No scleral icterus. Neck: Neck supple. Trachea midline. Cardiovascular: Normal rate, regular rhythm and intact distal pulses. No M/R/G Pulmonary/chest: Effort normal and breath sounds normal. No wheezing, rales or rhonchi. Abdominal: Soft, nontender, nondistended. Bowel sounds active throughout. Well healed abdominal scars Extremities: no clubbing, cyanosis,  trace pretibial edema Neurological: Alert and oriented to person place and time. Skin: Skin is warm and dry. No rashes noted. Psychiatric: Normal mood and affect. Behavior is normal.  CBC    Component Value Date/Time   WBC 8.1 09/11/2013 0440   RBC 3.92 09/11/2013 0440   HGB 11.5* 09/11/2013 0440   HCT 35.8* 09/11/2013 0440   PLT 185 09/11/2013 0440   MCV 91.3 09/11/2013 0440   MCH 29.3 09/11/2013 0440   MCHC 32.1 09/11/2013 0440   RDW 15.2 09/11/2013 0440   LYMPHSABS 0.8 09/10/2013 2119   MONOABS 0.7 09/10/2013 2119   EOSABS 0.0 09/10/2013 2119   BASOSABS 0.0 09/10/2013 2119  CMP     Component Value Date/Time   NA 140 09/11/2013 0440   K 4.2 09/11/2013 0440   CL 104 09/11/2013 0440   CO2 26 09/11/2013 0440   GLUCOSE 105* 09/11/2013 0440   BUN 15 09/11/2013 0440   CREATININE 0.92 09/11/2013 0440   CALCIUM 8.1* 09/11/2013 0440   PROT 6.1 09/11/2013 0440   ALBUMIN 2.8* 09/11/2013  0440   AST 118* 09/11/2013 0440   ALT 60* 09/11/2013 0440   ALKPHOS 82 09/11/2013 0440   BILITOT 1.6* 09/11/2013 0440   GFRNONAA 59* 09/11/2013 0440   GFRAA 68* 09/11/2013 0440   June 2013 UPPER GI W/ SMALL BOWEL HIGH DENSITY   Technique: Upper GI series performed with high density barium and effervescent agent. Thin barium also used. Subsequently, serial images of the small bowel were obtained including spot views of the terminal ileum.   Fluoroscopy Time: 3.26 minutes   Comparison: None.   Findings: Scout view of the abdomen shows nonspecific nonobstructive bowel gas pattern.  Mild degenerative changes lumbar spine.  Post cholecystectomy surgical clips are noted.  Stool noted in the right colon and transverse colon.   The esophagus shows normal contour, distensibility and peristalsis. No esophageal stricture.  No hiatal hernia. No gastroesophageal reflux was noted.   Stomach shows normal contour, distensibility and peristalsis.  No obstructing or constricting mass is noted.  Duodenal bulb and duodenal sweep are unremarkable.   Spot views and overheads were taken to evaluate the small bowel. The transit time was within normal limits.  No obstructing or constricting mass is noted.  Terminal ileum is well visualized and is unremarkable.  Some small bowel gas  in  distal small bowel probably from upper GI examination.   IMPRESSION: Unremarkable upper GI and small bowel follow-through examination. No evidence of small bowel obstruction.  No obstructing or constricting mass. _____________________________________________________________________________________ Jan 2015  CT ABDOMEN AND PELVIS WITHOUT CONTRAST   TECHNIQUE: Multidetector CT imaging of the abdomen and pelvis was performed following the standard protocol without intravenous contrast.   COMPARISON:  09/14/2011   FINDINGS: Study was specifically ordered without intravenous contrast material.   Subtle small focus of  micro nodularity identified in the extreme right lung base. There is dependent collapse/ consolidation in the posterior left lower lobe with a tiny left pleural effusion.   No focal abnormality is seen in the liver on this study without IV contrast material. Granulomata noted in the splenic parenchyma but no associated splenomegaly. No enlargement of the portal vein or venous collateralization in the abdomen to suggest portal venous hypertension.   The stomach, duodenum, pancreas, and adrenal glands are unremarkable. Gallbladder is surgically absent. Cortical scarring is identified in both kidneys, left greater than right. No hydronephrosis or renal stones.   Dense atherosclerotic calcification noted in the wall of the abdominal aorta without aneurysm.   Multiple adhesions are identified in the abdomen and upper pelvis. There are distended small bowel loops interspersed with areas of nondistended small bowel. The distended loops measure up to about 3.5 cm in maximum diameter. 1 of the more distended small bowel loops leads into a long transition zone followed by nondilated small bowel and this is well visualized on image 59 of series 2. The bowel wall in the transition area appears circumferentially thickened to a mild degree. This loop bears a striking similarity to a bowel loop visualized on the previous study 09/24/2011 (see image 62 of series 2 on that exam). There was a more modest  degree of bowel wall thickening associated with this transition area on the previous study. Contrast material does flow beyond this transition into the decompressed more distal small bowel loops. There is contrast material visualized in the colon.   Imaging through the pelvis shows a trace amount of intraperitoneal free fluid. No pelvic sidewall lymphadenopathy. No adnexal mass. Diverticular changes are noted in the left colon without diverticulitis. Terminal ileum and appendix have normal  imaging features.   Bone windows reveal no worrisome lytic or sclerotic osseous lesions.   IMPRESSION: Numerous adhesions in the abdomen and upper pelvis in this patient status post hysterectomy and radiation therapy for endometrial carcinoma. CT imaging features today all consistent with partial small bowel obstruction. There is a long transition zone from distended small bowel measuring about 3.5 cm down to nondistended small bowel and this area shows mild circumferential wall thickening through the transition. Overall appearance of the transition zone is strikingly similar to the appearance on the 09/14/2011 exam and given the lack of change in small appearance/orientation between the 2 exams, adhesions are almost certainly involved. The mild circumferential wall thickening today may be secondary to an associated degree of infectious or inflammatory enteritis. Given the relatively remote timing of the endometrial cancer and the smooth circumferential wall thickening today, metastatic involvement of the small bowel is considered unlikely. Of particular note, the oral contrast on today's study has migrated through this transition zone into the more distal small bowel and colon.      Assessment & Plan:  79 yo female with PMH of breast cancer in situ status post double mastectomy, endometrial cancer status post hysterectomy with radiation in 2006, atrial fibrillation on warfarin, colon polyps with last colonoscopy in 2011, TIAs, HTN, and recurrent partial small bowel obstructions who is seen in followup.  1.  Partial SBO -- she has had 2 episodes of small bowel obstruction requiring hospitalization in the last 18-20 months, though multiple other attacks which are very bothersome to her for which she has not sought care.  After reviewing her imaging and clinical symptoms, it seems that adhesive disease from prior abdominal surgery is the most likely cause of these transient obstructions. We  discussed other possibilities including inflammatory bowel disease, such as Crohn's disease, but this is felt to be much less likely. These attacks occur suddenly and resolves suddenly with conservative management. We discussed how the abrupt onset is not consistent with Crohn's.  I have recommended surgical referral for consideration of lysis of adhesion given the fact that she continues to be bothered by obstructions and it is impacting her life.  I will give her a prescription for Zofran and Levsin to be used as needed and as directed for nausea and abdominal discomfort. I have advised that she seek care if the pain becomes severe, the nausea and vomiting is intractable, or symptoms fail to resolve at home. Otherwise I have recommended a low fiber/low residue diet.  2.  Elevated liver enzymes -- liver enzymes were elevated on presentation in early January. They had not been elevated previously. I recommended a repeat hepatic function panel. She has labs planned for later this week with primary care, and I have asked that she notify primary care to ensure liver enzymes are performed. No further evaluation is felt necessary if her liver enzymes have normalized. The liver was unremarkable on recent noncontrasted CT.

## 2013-09-22 ENCOUNTER — Ambulatory Visit (INDEPENDENT_AMBULATORY_CARE_PROVIDER_SITE_OTHER): Payer: Medicare Other | Admitting: *Deleted

## 2013-09-22 DIAGNOSIS — I4891 Unspecified atrial fibrillation: Secondary | ICD-10-CM

## 2013-09-22 LAB — POCT INR: INR: 2.6

## 2013-09-29 ENCOUNTER — Encounter: Payer: Self-pay | Admitting: Internal Medicine

## 2013-09-29 ENCOUNTER — Ambulatory Visit (INDEPENDENT_AMBULATORY_CARE_PROVIDER_SITE_OTHER): Payer: Medicare Other | Admitting: *Deleted

## 2013-09-29 DIAGNOSIS — I4891 Unspecified atrial fibrillation: Secondary | ICD-10-CM

## 2013-09-29 DIAGNOSIS — I440 Atrioventricular block, first degree: Secondary | ICD-10-CM

## 2013-09-29 LAB — MDC_IDC_ENUM_SESS_TYPE_REMOTE
Battery Impedance: 133 Ohm
Brady Statistic AP VP Percent: 91 %
Brady Statistic AP VS Percent: 0 %
Brady Statistic AS VS Percent: 0 %
Date Time Interrogation Session: 20150123155844
Lead Channel Impedance Value: 529 Ohm
Lead Channel Impedance Value: 606 Ohm
Lead Channel Pacing Threshold Amplitude: 0.625 V
Lead Channel Pacing Threshold Pulse Width: 0.4 ms
Lead Channel Setting Pacing Amplitude: 2 V
Lead Channel Setting Pacing Amplitude: 2.5 V
MDC IDC MSMT BATTERY VOLTAGE: 2.78 V
MDC IDC SET LEADCHNL RV PACING PULSEWIDTH: 0.4 ms
MDC IDC SET LEADCHNL RV SENSING SENSITIVITY: 4 mV
MDC IDC STAT BRADY AS VP PERCENT: 8 %

## 2013-10-02 ENCOUNTER — Encounter: Payer: Self-pay | Admitting: Podiatry

## 2013-10-02 ENCOUNTER — Ambulatory Visit (INDEPENDENT_AMBULATORY_CARE_PROVIDER_SITE_OTHER): Payer: Medicare Other | Admitting: Podiatry

## 2013-10-02 VITALS — BP 158/70 | HR 82 | Resp 12

## 2013-10-02 DIAGNOSIS — B351 Tinea unguium: Secondary | ICD-10-CM

## 2013-10-02 DIAGNOSIS — M79609 Pain in unspecified limb: Secondary | ICD-10-CM

## 2013-10-03 NOTE — Progress Notes (Signed)
Patient ID: Yolanda Spears, female   DOB: 28-Aug-1935, 79 y.o.   MRN: [id number]  Subjective: This orientated x3 white female presents for ongoing debridement of painful mycotic toenails. The last visit for this service was 06/05/2013.  Objective: Elongated, hypertrophic, discolored toenails x10  Assessment: Symptomatic onychomycoses x10  Plan: Nails x10 debrided back without a bleeding. Reappoint at three-month intervals.

## 2013-10-05 ENCOUNTER — Ambulatory Visit (INDEPENDENT_AMBULATORY_CARE_PROVIDER_SITE_OTHER): Payer: Medicare Other | Admitting: Pharmacist

## 2013-10-05 DIAGNOSIS — Z5181 Encounter for therapeutic drug level monitoring: Secondary | ICD-10-CM

## 2013-10-05 DIAGNOSIS — I4891 Unspecified atrial fibrillation: Secondary | ICD-10-CM

## 2013-10-05 LAB — POCT INR: INR: 3.6

## 2013-10-17 ENCOUNTER — Encounter: Payer: Self-pay | Admitting: *Deleted

## 2013-10-18 ENCOUNTER — Ambulatory Visit (INDEPENDENT_AMBULATORY_CARE_PROVIDER_SITE_OTHER): Payer: Medicare Other | Admitting: Surgery

## 2013-10-18 ENCOUNTER — Ambulatory Visit (INDEPENDENT_AMBULATORY_CARE_PROVIDER_SITE_OTHER): Payer: Medicare Other | Admitting: *Deleted

## 2013-10-18 DIAGNOSIS — I4891 Unspecified atrial fibrillation: Secondary | ICD-10-CM

## 2013-10-18 DIAGNOSIS — Z5181 Encounter for therapeutic drug level monitoring: Secondary | ICD-10-CM

## 2013-10-18 LAB — POCT INR: INR: 4.2

## 2013-10-20 ENCOUNTER — Encounter (INDEPENDENT_AMBULATORY_CARE_PROVIDER_SITE_OTHER): Payer: Self-pay

## 2013-10-20 ENCOUNTER — Ambulatory Visit (INDEPENDENT_AMBULATORY_CARE_PROVIDER_SITE_OTHER): Payer: Medicare Other | Admitting: Surgery

## 2013-10-20 ENCOUNTER — Encounter (INDEPENDENT_AMBULATORY_CARE_PROVIDER_SITE_OTHER): Payer: Self-pay | Admitting: Surgery

## 2013-10-20 VITALS — BP 178/78 | HR 72 | Temp 98.7°F | Resp 20 | Ht 67.0 in | Wt 206.6 lb

## 2013-10-20 DIAGNOSIS — K6389 Other specified diseases of intestine: Secondary | ICD-10-CM

## 2013-10-20 DIAGNOSIS — K56600 Partial intestinal obstruction, unspecified as to cause: Secondary | ICD-10-CM

## 2013-10-20 DIAGNOSIS — K56609 Unspecified intestinal obstruction, unspecified as to partial versus complete obstruction: Secondary | ICD-10-CM

## 2013-10-20 DIAGNOSIS — K639 Disease of intestine, unspecified: Secondary | ICD-10-CM

## 2013-10-20 NOTE — Progress Notes (Signed)
Subjective:     Patient ID: Yolanda Spears, female   DOB: 1934/11/02, 79 y.o.   MRN: [id number]  HPI  Note: This dictation was prepared with Dragon/digital dictation along with Smartphrase technology. Any transcriptional errors that result from this process are unintentional.       Yolanda Spears  06-05-1935 [id number]  Patient Care Team: Thressa Sheller, MD as PCP - General [name], MD as Consulting Physician (Gynecologic Oncology) Darlin Coco, MD as Consulting Physician (Cardiology) Jerene Bears, MD as Consulting Physician (Gastroenterology) Adin Hector, MD as Consulting Physician (General Surgery)  This patient is a 79 y.o.female who presents today for surgical evaluation at the request of Dr. Hilarie Fredrickson.   Reason for visit: Recurring episodes of abdominal pain.  Probable chronic partial small bowel obstruction.  Consideration of surgery.  Pleasant obese elderly somewhat deconditioned female.  Developed endometrial cancer that required hysterectomy.  Develop some type of large pelvic cyst that required resection as well.  Eventually recovered.  She is to was intermittent episodes of abdominal pain and vomiting.  Goes through constipation and diarrhea.  Has had colonoscopies by Dr. Penne Lash.  She tells me they are normal.  Now followed by low-power gastroenterology.  In the past 5 years, she gets attacks of [place] pain.  The nausea and vomiting.  Pain reduces.  Then she has explosive diarrhea.  The episodes are happening much more frequently now.  More likely to have more intense episodes as well.  Gastroenterology has been trying to help troubleshoot this.  CT scan concerning for a transition point in the pelvis.  Concern for probable chronic intermittent obstruction.  She has been to the emergency room and admitted a few times for this.  Surgical consultation requested.  The patient comes in today with her son.  She walks with a cane.  Appetite okay.  Weight  relatively stable.  No personal nor family history of GI/colon cancer, inflammatory bowel disease, irritable bowel syndrome, allergy such as Celiac Sprue, dietary/dairy problems, colitis, ulcers nor gastritis.  No recent sick contacts/gastroenteritis.  No travel outside the country.  No changes in diet.  No dysphagia to solids or liquids.  No significant heartburn or reflux.  No hematochezia, hematemesis, coffee ground emesis.  No evidence of prior gastric/peptic ulceration.    Patient Active Problem List   Diagnosis Date Noted  . Thickened small bowel - ?radiation enteritis/scarring 10/20/2013  . Encounter for therapeutic drug monitoring 10/05/2013  . Sleep apnea 03/29/2013  . History of adenomatous polyp of colon 04/07/2012  . Orthostatic lightheadedness 11/27/2011  . Partial small bowel obstruction - recurrent 09/14/2011  . Vertigo 08/10/2011  . Hypercholesterolemia 08/10/2011  . TIA (transient ischemic attack) 04/28/2011  . Benign hypertensive heart disease without heart failure 01/09/2011  . First degree AV block 11/26/2010  . Pacemaker- Dual-chamber-mdt 11/26/2010  . Tachycardia-bradycardia syndrome 11/26/2010  . Atrial fibrillation 11/24/2010    Past Medical History  Diagnosis Date  . Pacemaker -[employer]   . Hypertension   . Peripheral neuropathy   . High cholesterol   . Heart murmur   . Shortness of breath on exertion   . Stroke     "small stroke & several TIA's"  . Atrial fibrillation   . History of radiation therapy   . Partial bowel obstruction JAN/2013  . Colon polyp 2011  . Endometrial cancer 2006    Stage IIIc  s/p radiation  . Breast cancer 1999    LOBULAR CARCINOMA IN SITU...  Marland Kitchen  Osteopenia 02/2013    T score -1.4 FRAX 11%/2.1%    Past Surgical History  Procedure Laterality Date  . Lymph node resection  2007  . Cholecystectomy  1990's  . Insert / replace / remove pacemaker  2007    initial placement  . Mastectomy  1999    bilaterally w/lymph node  dissection  . Abdominal hysterectomy  2006    BSO  . Oophorectomy      BSO  . Exploratory laparotomy  2007    remove abdominal cyst    History   Social History  . Marital Status: Widowed    Spouse Name: N/A    Number of Children: 2  . Years of Education: N/A   Occupational History  . Retired    Social History Main Topics  . Smoking status: Former Smoker -- 1.00 packs/day for 40 years    Types: Cigarettes    Quit date: 11/26/1998  . Smokeless tobacco: Never Used  . Alcohol Use: No     Comment: 09/14/11 "last drink ~ 08/31/2009"  . Drug Use: No  . Sexual Activity: No   Other Topics Concern  . Not on file   Social History Narrative   Widowed mother of 2    Family History  Problem Relation Age of Onset  . Cancer Mother 58    UTERINE  . Breast cancer Mother 61  . Hypertension Mother   . Uterine cancer Mother   . Cancer Father     PROSTATE  . Hypertension Father     Current Outpatient Prescriptions  Medication Sig Dispense Refill  . aspirin 81 MG tablet Take 81 mg by mouth daily.      . calcium carbonate (TUMS - DOSED IN MG ELEMENTAL CALCIUM) 500 MG chewable tablet Chew 1 tablet by mouth daily.      . Cholecalciferol (VITAMIN D-3) 5000 UNITS TABS Take 1 tablet by mouth 4 (four) times a week. Takes on Saturday, Sunday, Tuesday and Thursday      . ezetimibe (ZETIA) 10 MG tablet Take 0.5 tablets (5 mg total) by mouth daily.  30 tablet  5  . furosemide (LASIX) 20 MG tablet Take 20 mg by mouth daily.      . hydrALAZINE (APRESOLINE) 25 MG tablet Take 25 mg by mouth 2 (two) times daily.      . hyoscyamine (LEVSIN/SL) 0.125 MG SL tablet Place 1 tablet (0.125 mg total) under the tongue every 4 (four) hours as needed.  30 tablet  5  . L-Methylfolate-B6-B12 (METANX PO) Take 1 tablet by mouth daily.       Marland Kitchen lisinopril (PRINIVIL,ZESTRIL) 10 MG tablet Take 10 mg by mouth 2 (two) times daily.      . metoprolol succinate (TOPROL-XL) 100 MG 24 hr tablet Take 1 tablet (100 mg total)  by mouth daily. Take with or immediately following a meal.  90 tablet  3  . ondansetron (ZOFRAN ODT) 4 MG disintegrating tablet Take 1 tablet (4 mg total) by mouth every 8 (eight) hours as needed for nausea or vomiting.  30 tablet  1  . pregabalin (LYRICA) 75 MG capsule Take 75 mg by mouth 2 (two) times daily.      . propafenone (RYTHMOL) 225 MG tablet Take 1 tablet (225 mg total) by mouth every 8 (eight) hours.  270 tablet  3  . warfarin (COUMADIN) 2.5 MG tablet Take 1.25-2.5 mg by mouth See admin instructions. Take 0.5 tablet on Monday and Friday. Take 1 tablet all  other days.       No current facility-administered medications for this visit.     Allergies  Allergen Reactions  . Other     Contrast dye   . Penicillins Itching  . Shellfish-Derived Products Nausea And Vomiting  . Statins     Myalgias,elevated LFT studies  . Iohexol Hives and Itching    Itching in eyes, one hive on face.    BP 178/78  Pulse 72  Temp(Src) 98.7 F (37.1 C) (Oral)  Resp 20  Ht 5\' 7"  (1.702 m)  Wt 206 lb 9.6 oz (93.713 kg)  BMI 32.35 kg/m2  No results found.   Review of Systems  Constitutional: Negative for fever, chills, diaphoresis, appetite change and fatigue.  HENT: Negative for ear discharge, ear pain, sore throat and trouble swallowing.   Eyes: Negative for photophobia, discharge and visual disturbance.  Respiratory: Positive for shortness of breath. Negative for cough, choking, chest tightness, wheezing and stridor.   Cardiovascular: Positive for palpitations and leg swelling. Negative for chest pain.  Gastrointestinal: Positive for abdominal pain, diarrhea, constipation and abdominal distention. Negative for nausea, vomiting, anal bleeding and rectal pain.  Endocrine: Negative for cold intolerance and heat intolerance.  Genitourinary: Negative for dysuria, frequency and difficulty urinating.  Musculoskeletal: Negative for gait problem, myalgias and neck pain.  Skin: Negative for color  change, pallor and rash.  Allergic/Immunologic: Negative for environmental allergies, food allergies and immunocompromised state.  Neurological: Negative for dizziness, speech difficulty, weakness and numbness.  Hematological: Negative for adenopathy.  Psychiatric/Behavioral: Negative for confusion and agitation. The patient is not nervous/anxious.        Objective:   Physical Exam  Constitutional: She is oriented to person, place, and time. She appears well-developed and well-nourished. No distress.  HENT:  Head: Normocephalic.  Mouth/Throat: Oropharynx is clear and moist. No oropharyngeal exudate.  Eyes: Conjunctivae and EOM are normal. Pupils are equal, round, and reactive to light. No scleral icterus.  Neck: Normal range of motion. Neck supple. No tracheal deviation present.  Cardiovascular: Normal rate, regular rhythm and intact distal pulses.   Pulmonary/Chest: Effort normal and breath sounds normal. No stridor. No respiratory distress. She exhibits no tenderness.  Abdominal: Soft. She exhibits no distension and no mass. There is no tenderness. There is no rigidity, no rebound, no guarding, no CVA tenderness, no tenderness at McBurney's point and negative Murphy's sign. No hernia. Hernia confirmed negative in the ventral area, confirmed negative in the right inguinal area and confirmed negative in the left inguinal area.    Genitourinary: No vaginal discharge found.  Musculoskeletal: Normal range of motion. She exhibits no tenderness.       Right elbow: She exhibits normal range of motion.       Left elbow: She exhibits normal range of motion.       Right wrist: She exhibits normal range of motion.       Left wrist: She exhibits normal range of motion.       Right hand: Normal strength noted.       Left hand: Normal strength noted.  Lymphadenopathy:       Head (right side): No posterior auricular adenopathy present.       Head (left side): No posterior auricular adenopathy  present.    She has no cervical adenopathy.    She has no axillary adenopathy.       Right: No inguinal adenopathy present.       Left: No inguinal adenopathy present.  Neurological: She is alert and oriented to person, place, and time. No cranial nerve deficit. She exhibits normal muscle tone. Coordination normal.  Skin: Skin is warm and dry. No rash noted. She is not diaphoretic. No erythema.  Psychiatric: She has a normal mood and affect. Her behavior is normal. Judgment and thought content normal.       Assessment:     Recurrent episodes of bloating pain and diarrhea suspicious for partial small bowel obstruction that is chronic.  Probably related to prior radiation with thickened/strictured intestine & Adhesions from prior operations    Plan:     I spent an hour performing history and physical, reviewing films, and discussing options.  I think there is the potential benefit for a laparoscopic exploration and lysis of adhesions to find a transition point.  I suspect she may need a small bowel resection to remove the thickened intestine seen on the CAT scan.  Would do that if there was a short segment.  Would hold off if it is a massive section.  CAT scan implies a short region.  With her health issues, her risk is above-average.  The biggest concern I have is a risk of delayed injuries/week/ostomy if she has significant adhesions from her prior major pelvic surgery and radiation.  However, I see no other options since she has been compliant on about regimen and has had appropriate care with her primary care physician and gastroenterologist.  She seems quite miserable.  She understands the risks.  She wishes to give surgery a try:  The anatomy & physiology of the digestive tract was discussed.  The pathophysiology of intestinal obstruction was discussed.  Natural history risks without surgery was discussed.   I feel the patient has failed non-operative therapies.  The risks of no intervention  will lead to serious problems such as necrosis, perforation, dehydration, etc. that outweigh the operative risks; therefore, I recommended abdominal exploration to diagnose & treat the source of the problem.  Laparoscopic & open techniques were discussed.   I expressed a good likelihood that surgery will treat the problem.  Risks such as bleeding, infection, abscess, leak, reoperation, bowel resection, possible ostomy, fistula, hernia, heart attack, death, and other risks were discussed.   I noted a good likelihood this will help address the problem.  I did note that there is a possibility this does not help at all and she has other reasons for her digestive tract/abdominal complaints.  Goals of post-operative recovery were discussed as well.  We will work to minimize complications. Questions were answered.  The patient expresses understanding & wishes to proceed with surgery.

## 2013-10-20 NOTE — Patient Instructions (Signed)
Please consider the recommendations that we have given you today:  Consider laparoscopic exploration with possible bowel resection for chronic recurrent bowel obstructions.  He may have a short segment of thickened intestine from prior radiation that needs to be removed.  He will need clearance from her cardiologist, Dr. Mare Ferrari, to make sure it is safe to tolerate surgery.  Also help in managing her blood thinner warfarin around the time of surgery.  See the Handout(s) we have given you.  Please call our office at [phone] if you wish to schedule surgery or if you have further questions / concerns.   Small Bowel Obstruction A small bowel obstruction is a blockage (obstruction) of the small intestine (small bowel). The small bowel is a long, slender tube that connects the stomach to the colon. Its job is to absorb nutrients from the fluids and foods you consume into the bloodstream.  CAUSES  There are many causes of intestinal blockage. The most common ones include:  Hernias. This is a more common cause in children than adults.  Inflammatory bowel disease (enteritis and colitis).  Twisting of the bowel (volvulus).  Tumors.  Scar tissue (adhesions) from previous surgery or radiation treatment.  Recent surgery. This may cause an acute small bowel obstruction called an ileus. SYMPTOMS   Abdominal pain. This may be dull cramps or sharp pain. It may occur in one area or may be present in the entire abdomen. Pain can range from mild to severe, depending on the degree of obstruction.  Nausea and vomiting. Vomit may be greenish or yellow bile color.  Distended or swollen stomach. Abdominal bloating is a common symptom.  Constipation.  Lack of passing gas.  Frequent belching.  Diarrhea. This may occur if runny stool is able to leak around the obstruction. DIAGNOSIS  Your caregiver can usually diagnose small bowel obstruction by taking a history, doing a physical exam, and  taking X-rays. If the cause is unclear, a CT scan (computerized tomography) of your abdomen and pelvis may be needed. TREATMENT  Treatment of the blockage depends on the cause and how bad the problem is.   Sometimes, the obstruction improves with bed rest and intravenous (IV) fluids.  Resting the bowel is very important. This means following a simple diet. Sometimes, a clear liquid diet may be required for several days.  Sometimes, a small tube (nasogastric tube) is placed into the stomach to decompress the bowel. When the bowel is blocked, it usually swells up like a balloon filled with air and fluids. Decompression means that the air and fluids are removed by suction through that tube. This can help with pain, discomfort, and nausea. It can also help the obstruction resolve faster.  Surgery may be required if other treatments do not work. Bowel obstruction from a hernia may require early surgery and can be an emergency procedure. Adhesions that cause frequent or severe obstructions may also require surgery. HOME CARE INSTRUCTIONS If your bowel obstruction is only partial or incomplete, you may be allowed to go home.  Get plenty of rest.  Follow your diet as directed by your caregiver.  Only consume clear liquids until your condition improves.  Avoid solid foods as instructed. SEEK IMMEDIATE MEDICAL CARE IF:  You have increased pain or cramping.  You vomit blood.  You have uncontrolled vomiting or nausea.  You cannot drink fluids due to vomiting or pain.  You develop confusion.  You begin feeling very dry or thirsty (dehydrated).  You have severe bloating.  You have chills.  You have a fever.  You feel extremely weak or you faint. MAKE SURE YOU:  Understand these instructions.  Will watch your condition.  Will get help right away if you are not doing well or get worse. Document Released: 11/10/2005 Document Revised: 11/16/2011 Document Reviewed: 11/07/2010 [facility]  Patient Information 2014 [place].  GETTING TO GOOD BOWEL HEALTH. Irregular bowel habits such as constipation and diarrhea can lead to many problems over time.  Having one soft bowel movement a day is the most important way to prevent further problems.  The anorectal canal is designed to handle stretching and feces to safely manage our ability to get rid of solid waste (feces, poop, stool) out of our body.  BUT, hard constipated stools can act like ripping concrete bricks and diarrhea can be a burning fire to this very sensitive area of our body, causing inflamed hemorrhoids, anal fissures, increasing risk is perirectal abscesses, abdominal pain/bloating, an making irritable bowel worse.     The goal: ONE SOFT BOWEL MOVEMENT A DAY!  To have soft, regular bowel movements:    Drink at least 8 tall glasses of water a day.     Take plenty of fiber.  Fiber is the undigested part of plant food that passes into the colon, acting s "natures broom" to encourage bowel motility and movement.  Fiber can absorb and hold large amounts of water. This results in a larger, bulkier stool, which is soft and easier to pass. Work gradually over several weeks up to 6 servings a day of fiber (25g a day even more if needed) in the form of: o Vegetables -- Root (potatoes, carrots, turnips), leafy green (lettuce, salad greens, celery, spinach), or cooked high residue (cabbage, broccoli, etc) o Fruit -- Fresh (unpeeled skin & pulp), Dried (prunes, apricots, cherries, etc ),  or stewed ( applesauce)  o Whole grain breads, pasta, etc (whole wheat)  o Bran cereals    Bulking Agents -- This type of water-retaining fiber generally is easily obtained each day by one of the following:  o Psyllium bran -- The psyllium plant is remarkable because its ground seeds can retain so much water. This product is available as Metamucil, Konsyl, Effersyllium, Per Diem Fiber, or the less expensive generic preparation in drug and health food  stores. Although labeled a laxative, it really is not a laxative.  o Methylcellulose -- This is another fiber derived from wood which also retains water. It is available as Citrucel. o Polyethylene Glycol - and "artificial" fiber commonly called Miralax or Glycolax.  It is helpful for people with gassy or bloated feelings with regular fiber o Flax Seed - a less gassy fiber than psyllium   No reading or other relaxing activity while on the toilet. If bowel movements take longer than 5 minutes, you are too constipated   AVOID CONSTIPATION.  High fiber and water intake usually takes care of this.  Sometimes a laxative is needed to stimulate more frequent bowel movements, but    Laxatives are not a good long-term solution as it can wear the colon out. o Osmotics (Milk of Magnesia, Fleets phosphosoda, Magnesium citrate, MiraLax, GoLytely) are safer than  o Stimulants (Senokot, Castor Oil, Dulcolax, Ex Lax)    o Do not take laxatives for more than 7days in a row.    IF SEVERELY CONSTIPATED, try a Bowel Retraining Program: o Do not use laxatives.  o Eat a diet high in roughage, such as bran cereals  and leafy vegetables.  o Drink six (6) ounces of prune or apricot juice each morning.  o Eat two (2) large servings of stewed fruit each day.  o Take one (1) heaping tablespoon of a psyllium-based bulking agent twice a day. Use sugar-free sweetener when possible to avoid excessive calories.  o Eat a normal breakfast.  o Set aside 15 minutes after breakfast to sit on the toilet, but do not strain to have a bowel movement.  o If you do not have a bowel movement by the third day, use an enema and repeat the above steps.    Controlling diarrhea o Switch to liquids and simpler foods for a few days to avoid stressing your intestines further. o Avoid dairy products (especially milk & ice cream) for a short time.  The intestines often can lose the ability to digest lactose when stressed. o Avoid foods that cause  gassiness or bloating.  Typical foods include beans and other legumes, cabbage, broccoli, and dairy foods.  Every person has some sensitivity to other foods, so listen to our body and avoid those foods that trigger problems for you. o Adding fiber (Citrucel, Metamucil, psyllium, Miralax) gradually can help thicken stools by absorbing excess fluid and retrain the intestines to act more normally.  Slowly increase the dose over a few weeks.  Too much fiber too soon can backfire and cause cramping & bloating. o Probiotics (such as active yogurt, Align, etc) may help repopulate the intestines and colon with normal bacteria and calm down a sensitive digestive tract.  Most studies show it to be of mild help, though, and such products can be costly. o Medicines:   Bismuth subsalicylate (ex. Kayopectate, Pepto Bismol) every 30 minutes for up to 6 doses can help control diarrhea.  Avoid if pregnant.   Loperamide (Immodium) can slow down diarrhea.  Start with two tablets (4mg  total) first and then try one tablet every 6 hours.  Avoid if you are having fevers or severe pain.  If you are not better or start feeling worse, stop all medicines and call your doctor for advice o Call your doctor if you are getting worse or not better.  Sometimes further testing (cultures, endoscopy, X-ray studies, bloodwork, etc) may be needed to help diagnose and treat the cause of the diarrhea.  LAPAROSCOPIC SURGERY: POST OP INSTRUCTIONS  1. DIET: Follow a light bland diet the first 24 hours after arrival home, such as soup, liquids, crackers, etc.  Be sure to include lots of fluids daily.  Avoid fast food or heavy meals as your are more likely to get nauseated.  Eat a low fat the next few days after surgery.   2. Take your usually prescribed home medications unless otherwise directed. 3. PAIN CONTROL: a. Pain is best controlled by a usual combination of three different methods TOGETHER: i. Ice/Heat ii. Over the counter pain  medication iii. Prescription pain medication b. Most patients will experience some swelling and bruising around the incisions.  Ice packs or heating pads (30-60 minutes up to 6 times a day) will help. Use ice for the first few days to help decrease swelling and bruising, then switch to heat to help relax tight/sore spots and speed recovery.  Some people prefer to use ice alone, heat alone, alternating between ice & heat.  Experiment to what works for you.  Swelling and bruising can take several weeks to resolve.   c. It is helpful to take an over-the-counter pain medication regularly for  the first few weeks.  Choose one of the following that works best for you: i. Naproxen (Aleve, etc)  Two 220mg  tabs twice a day ii. Ibuprofen (Advil, etc) Three 200mg  tabs four times a day (every meal & bedtime) iii. Acetaminophen (Tylenol, etc) 500-650mg  four times a day (every meal & bedtime) d. A  prescription for pain medication (such as oxycodone, hydrocodone, etc) should be given to you upon discharge.  Take your pain medication as prescribed.  i. If you are having problems/concerns with the prescription medicine (does not control pain, nausea, vomiting, rash, itching, etc), please call us [phone] to see if we need to switch you to a different pain medicine that will work better for you and/or control your side effect better. ii. If you need a refill on your pain medication, please contact your pharmacy.  They will contact our office to request authorization. Prescriptions will not be filled after 5 pm or on week-ends. 4. Avoid getting constipated.  Between the surgery and the pain medications, it is common to experience some constipation.  Increasing fluid intake and taking a fiber supplement (such as Metamucil, Citrucel, FiberCon, MiraLax, etc) 1-2 times a day regularly will usually help prevent this problem from occurring.  A mild laxative (prune juice, Milk of Magnesia, MiraLax, etc) should be taken  according to package directions if there are no bowel movements after 48 hours.   5. Watch out for diarrhea.  If you have many loose bowel movements, simplify your diet to bland foods & liquids for a few days.  Stop any stool softeners and decrease your fiber supplement.  Switching to mild anti-diarrheal medications (Kayopectate, Pepto Bismol) can help.  If this worsens or does not improve, please call us. 6. Wash / shower every day.  You may shower over the dressings as they are waterproof.  Continue to shower over incision(s) after the dressing is off. 7. Remove your waterproof bandages 5 days after surgery.  You may leave the incision open to air.  You may replace a dressing/Band-Aid to cover the incision for comfort if you wish.  8. ACTIVITIES as tolerated:   a. You may resume regular (light) daily activities beginning the next day-such as daily self-care, walking, climbing stairs-gradually increasing activities as tolerated.  If you can walk 30 minutes without difficulty, it is safe to try more intense activity such as jogging, treadmill, bicycling, low-impact aerobics, swimming, etc. b. Save the most intensive and strenuous activity for last such as sit-ups, heavy lifting, contact sports, etc  Refrain from any heavy lifting or straining until you are off narcotics for pain control.   c. DO NOT PUSH THROUGH PAIN.  Let pain be your guide: If it hurts to do something, don't do it.  Pain is your body warning you to avoid that activity for another week until the pain goes down. d. You may drive when you are no longer taking prescription pain medication, you can comfortably wear a seatbelt, and you can safely maneuver your car and apply brakes. e. Dennis Bast may have sexual intercourse when it is comfortable.  9. FOLLOW UP in our office a. Please call [facility] at [phone] to set up an appointment to see your surgeon in the office for a follow-up appointment approximately 2-3 weeks after your surgery. b. Make  sure that you call for this appointment the day you arrive home to insure a convenient appointment time. 10. IF YOU HAVE DISABILITY OR FAMILY LEAVE FORMS, BRING THEM TO THE  OFFICE FOR PROCESSING.  DO NOT GIVE THEM TO YOUR DOCTOR.   WHEN TO CALL us [phone]: 1. Poor pain control 2. Reactions / problems with new medications (rash/itching, nausea, etc)  3. Fever over 101.5 F (38.5 C) 4. Inability to urinate 5. Nausea and/or vomiting 6. Worsening swelling or bruising 7. Continued bleeding from incision. 8. Increased pain, redness, or drainage from the incision   The clinic staff is available to answer your questions during regular business hours (8:30am-5pm).  Please don't hesitate to call and ask to speak to one of our nurses for clinical concerns.   If you have a medical emergency, go to the nearest emergency room or call 911.  A surgeon from [facility] is always on call at the [facility], [place], [place], [place], [place]  [address] ? MAIN: [phone] ? TOLL FREE: [phone] ?  FAX [phone] [email or web address]

## 2013-10-30 ENCOUNTER — Encounter (INDEPENDENT_AMBULATORY_CARE_PROVIDER_SITE_OTHER): Payer: Self-pay

## 2013-11-01 ENCOUNTER — Encounter (INDEPENDENT_AMBULATORY_CARE_PROVIDER_SITE_OTHER): Payer: Self-pay

## 2013-11-01 ENCOUNTER — Other Ambulatory Visit: Payer: Self-pay | Admitting: Cardiology

## 2013-11-01 ENCOUNTER — Telehealth (INDEPENDENT_AMBULATORY_CARE_PROVIDER_SITE_OTHER): Payer: Self-pay

## 2013-11-01 DIAGNOSIS — K56600 Partial intestinal obstruction, unspecified as to cause: Secondary | ICD-10-CM

## 2013-11-01 MED ORDER — NEOMYCIN SULFATE 500 MG PO TABS: ORAL_TABLET | ORAL | Status: DC

## 2013-11-01 MED ORDER — METRONIDAZOLE 500 MG PO TABS: ORAL_TABLET | ORAL | Status: DC

## 2013-11-01 NOTE — Telephone Encounter (Signed)
Called pt to notify her that she is cleared from Dr Mare Ferrari for surgery. The pt is advised to stop the Coumadin 5 days before surgery and stay on the baby aspirin 81mg  per Dr Mare Ferrari. I will mail the pt her colon prep of the upper colon instructions from Dr Johney Maine along with the two abx's attatched to the sheet. I will turn surgical orders into scheduling today. The pt is aware.

## 2013-11-06 ENCOUNTER — Other Ambulatory Visit: Payer: Self-pay | Admitting: Cardiology

## 2013-11-07 ENCOUNTER — Ambulatory Visit (INDEPENDENT_AMBULATORY_CARE_PROVIDER_SITE_OTHER): Payer: Medicare Other | Admitting: *Deleted

## 2013-11-07 DIAGNOSIS — I4891 Unspecified atrial fibrillation: Secondary | ICD-10-CM

## 2013-11-07 DIAGNOSIS — Z5181 Encounter for therapeutic drug level monitoring: Secondary | ICD-10-CM

## 2013-11-07 LAB — POCT INR: INR: 3.1

## 2013-11-07 MED ORDER — WARFARIN SODIUM 2.5 MG PO TABS
2.5000 mg | ORAL_TABLET | ORAL | Status: DC
Start: 2013-11-07 — End: 2013-12-18

## 2013-11-08 ENCOUNTER — Telehealth (INDEPENDENT_AMBULATORY_CARE_PROVIDER_SITE_OTHER): Payer: Self-pay | Admitting: Surgery

## 2013-11-08 NOTE — Telephone Encounter (Signed)
Talked to patient today, she is waiting for her [place] before setting a date for surgery as her daughter wants him here too.

## 2013-11-22 ENCOUNTER — Telehealth (INDEPENDENT_AMBULATORY_CARE_PROVIDER_SITE_OTHER): Payer: Self-pay | Admitting: General Surgery

## 2013-11-22 NOTE — Telephone Encounter (Signed)
Pt called with several generalized questions about her preoperative instructions.  Clarified them all to her understanding.

## 2013-11-29 ENCOUNTER — Ambulatory Visit (INDEPENDENT_AMBULATORY_CARE_PROVIDER_SITE_OTHER): Payer: Medicare Other | Admitting: Cardiology

## 2013-11-29 ENCOUNTER — Encounter: Payer: Self-pay | Admitting: Cardiology

## 2013-11-29 ENCOUNTER — Ambulatory Visit (INDEPENDENT_AMBULATORY_CARE_PROVIDER_SITE_OTHER): Payer: Medicare Other | Admitting: Pharmacist

## 2013-11-29 VITALS — BP 126/64 | HR 82 | Ht 67.0 in | Wt 199.0 lb

## 2013-11-29 DIAGNOSIS — I4891 Unspecified atrial fibrillation: Secondary | ICD-10-CM

## 2013-11-29 DIAGNOSIS — I119 Hypertensive heart disease without heart failure: Secondary | ICD-10-CM

## 2013-11-29 DIAGNOSIS — K56609 Unspecified intestinal obstruction, unspecified as to partial versus complete obstruction: Secondary | ICD-10-CM

## 2013-11-29 DIAGNOSIS — G459 Transient cerebral ischemic attack, unspecified: Secondary | ICD-10-CM

## 2013-11-29 DIAGNOSIS — Z5181 Encounter for therapeutic drug level monitoring: Secondary | ICD-10-CM

## 2013-11-29 DIAGNOSIS — I480 Paroxysmal atrial fibrillation: Secondary | ICD-10-CM

## 2013-11-29 DIAGNOSIS — K56600 Partial intestinal obstruction, unspecified as to cause: Secondary | ICD-10-CM

## 2013-11-29 LAB — POCT INR: INR: 2.8

## 2013-11-29 NOTE — Progress Notes (Signed)
Yolanda Spears Date of Birth:  05-08-1935 [address] [place], [place]  [address] [phone]         Fax   [phone]  History of Present Illness: This pleasant 79 year old woman is seen for a scheduled followup office visit. The patient has a past history of tachybradycardia syndrome and had a pacemaker implanted in 2007. She had a pacemaker generator change by Dr. Caryl Comes on 03/23/12. She is on chronic Coumadin anticoagulation. The patient had what sounds like a possible TIA during the summer. She has had no recurrence. She is now on a baby aspirin as well as chronic Coumadin. The patient has not been having any lightheadedness or syncope. She has not been aware of any racing of her heart. No palpitations. He does have chronic positional vertigo when she changes positions or rolls over in bed. She has a history of peripheral neuropathy and sees Dr. Krista Blue who has her on Lyrica.  She sleeps poorly.  She has been told that she has sleep apnea.  She tried a CPAP machine years ago but was unable to tolerate it.  She has issues with her balance and is considering hiring a Physiological scientist to come to her home to help her.  She does not drive.  She has had problems with recurrent small bowel obstruction and is scheduled for surgery by Dr. Michael Boston on 12/28/13.  Her surgery will be at [facility] long period she will continue her aspirin and she will hold her warfarin for 5 days preop.   Current Outpatient Prescriptions  Medication Sig Dispense Refill  . aspirin 81 MG tablet Take 81 mg by mouth daily.      . calcium carbonate (TUMS - DOSED IN MG ELEMENTAL CALCIUM) 500 MG chewable tablet Chew 1 tablet by mouth daily.      . Cholecalciferol (VITAMIN D-3) 5000 UNITS TABS Take 1 tablet by mouth 4 (four) times a week. Takes on Saturday, Sunday, Tuesday and Thursday      . ezetimibe (ZETIA) 10 MG tablet Take 0.5 tablets (5 mg total) by mouth daily.  30 tablet  5  . furosemide (LASIX) 20 MG  tablet Take 20 mg by mouth daily.      . hydrALAZINE (APRESOLINE) 25 MG tablet Take 25 mg by mouth 2 (two) times daily.      . hyoscyamine (LEVSIN/SL) 0.125 MG SL tablet Place 1 tablet (0.125 mg total) under the tongue every 4 (four) hours as needed.  30 tablet  5  . L-Methylfolate-B6-B12 (METANX PO) Take 1 tablet by mouth daily.       Marland Kitchen lisinopril (PRINIVIL,ZESTRIL) 10 MG tablet Take 10 mg by mouth 2 (two) times daily.      Marland Kitchen lisinopril (PRINIVIL,ZESTRIL) 10 MG tablet TAKE 1 TABLET BY MOUTH TWICE A DAY  60 tablet  3  . metoprolol succinate (TOPROL-XL) 100 MG 24 hr tablet Take 1 tablet (100 mg total) by mouth daily. Take with or immediately following a meal.  90 tablet  3  . metroNIDAZOLE (FLAGYL) 500 MG tablet Take 1 tablet (500mg  total) by mouth take as directed. Take 2 pills (=1000mg ) by mouth @ 1pm,3pm, and 10pm the day before your colorectal operation as discussed in [facility]. Call [phone] with questions.  6 tablet  0  . neomycin (MYCIFRADIN) 500 MG tablet Take 2 tablets (1,000mg  total) by mouth as directed. Take 2 pills (=1000mg ) by mouth @ 1pm,3pm,and 10pm the day before your colorectal operation as discussed in  [facility] office. Call [phone] with questions.  6 tablet  0  . ondansetron (ZOFRAN ODT) 4 MG disintegrating tablet Take 1 tablet (4 mg total) by mouth every 8 (eight) hours as needed for nausea or vomiting.  30 tablet  1  . pregabalin (LYRICA) 75 MG capsule Take 75 mg by mouth 2 (two) times daily.      . propafenone (RYTHMOL) 225 MG tablet Take 1 tablet (225 mg total) by mouth every 8 (eight) hours.  270 tablet  3  . warfarin (COUMADIN) 2.5 MG tablet Take 1 tablet (2.5 mg total) by mouth as directed. Take 0.5 tablet on Monday and Friday. Take 1 tablet all other days.  30 tablet  3   No current facility-administered medications for this visit.    Allergies  Allergen Reactions  . Other     Contrast dye   . Penicillins Itching  . Shellfish-Derived Products Nausea And Vomiting  . Statins       Myalgias,elevated LFT studies  . Iohexol Hives and Itching    Itching in eyes, one hive on face.    Patient Active Problem List   Diagnosis Date Noted  . Atrial fibrillation 11/24/2010    Priority: Medium  . Thickened small bowel - ?radiation enteritis/scarring 10/20/2013  . Encounter for therapeutic drug monitoring 10/05/2013  . Sleep apnea 03/29/2013  . History of adenomatous polyp of colon 04/07/2012  . Orthostatic lightheadedness 11/27/2011  . Partial small bowel obstruction - recurrent 09/14/2011  . Vertigo 08/10/2011  . Hypercholesterolemia 08/10/2011  . TIA (transient ischemic attack) 04/28/2011  . Benign hypertensive heart disease without heart failure 01/09/2011  . First degree AV block 11/26/2010  . Pacemaker- Dual-chamber-mdt 11/26/2010  . Tachycardia-bradycardia syndrome 11/26/2010    History  Smoking status  . Former Smoker -- 1.00 packs/day for 40 years  . Types: Cigarettes  . Quit date: 11/26/1998  Smokeless tobacco  . Never Used    History  Alcohol Use No    Comment: 09/14/11 "last drink ~ 08/31/2009"    Family History  Problem Relation Age of Onset  . Cancer Mother 54    UTERINE  . Breast cancer Mother 57  . Hypertension Mother   . Uterine cancer Mother   . Cancer Father     PROSTATE  . Hypertension Father     Review of Systems: Constitutional: no fever chills diaphoresis or fatigue or change in weight.  Head and neck: no hearing loss, no epistaxis, no photophobia or visual disturbance. Respiratory: No cough, shortness of breath or wheezing. Cardiovascular: No chest pain peripheral edema, palpitations. Gastrointestinal: No abdominal distention, no abdominal pain, no change in bowel habits hematochezia or melena. Genitourinary: No dysuria, no frequency, no urgency, no nocturia. Musculoskeletal:No arthralgias, no back pain, no gait disturbance or myalgias. Neurological: No dizziness, no headaches, no numbness, no seizures, no syncope, no  weakness, no tremors. Hematologic: No lymphadenopathy, no easy bruising. Psychiatric: No confusion, no hallucinations, no sleep disturbance.    Physical Exam: Filed Vitals:   11/29/13 1353  BP: 126/64  Pulse: 82   the general appearance reveals a well-developed well-nourished woman in no distress.The head and neck exam reveals pupils equal and reactive.  Extraocular movements are full.  There is no scleral icterus.  The mouth and pharynx are normal.  The neck is supple.  The carotids reveal no bruits.  The jugular venous pressure is normal.  The  thyroid is not enlarged.  There is no lymphadenopathy.  The chest is clear to  percussion and auscultation.  There are no rales or rhonchi.  Expansion of the chest is symmetrical.  The precordium is quiet.  The first heart sound is normal.  The second heart sound is physiologically split.  There is no murmur gallop rub or click.  There is no abnormal lift or heave.  The abdomen is soft and nontender.  The bowel sounds are normal.  The liver and spleen are not enlarged.  There are no abdominal masses.  There are no abdominal bruits.  Extremities reveal good pedal pulses.  There is no phlebitis or edema.  There is no cyanosis or clubbing.  Strength is normal and symmetrical in all extremities.  There is no lateralizing weakness.  There are no sensory deficits.  The skin is warm and dry.  There is no rash.     Assessment / Plan: Continue same medication.  Overall she is doing well.  Her weight is down 7 pounds since last visit. Recheck in 4 months for office visit and EKG

## 2013-11-29 NOTE — Assessment & Plan Note (Signed)
The patient has paroxysmal atrial fibrillation.  She has a paced ventricular rate.  She has not been aware of any racing of her heart.

## 2013-11-29 NOTE — Assessment & Plan Note (Signed)
The patient has had no further TIA spells since she went on aspirin and Coumadin.

## 2013-11-29 NOTE — Assessment & Plan Note (Signed)
The patient denies any chest pain or increased dyspnea.  No headaches.  Her dizziness is no worse.

## 2013-11-29 NOTE — Patient Instructions (Signed)
Your physician recommends that you continue on your current medications as directed. Please refer to the Current Medication list given to you today.  Your physician wants you to follow-up in: 4 month ov/ekg  You will receive a reminder letter in the mail two months in advance. If you don't receive a letter, please call our office to schedule the follow-up appointment.  

## 2013-11-29 NOTE — Assessment & Plan Note (Signed)
The patient is currently without any acute symptoms regarding her history of small bowel obstruction .  She will undergo laparoscopic surgery on 12/28/13 by Dr. Johney Maine.  From a cardiac standpoint she is cleared for surgery.  She will hold her warfarin for 5 days preoperatively while continuing her aspirin

## 2013-12-04 ENCOUNTER — Other Ambulatory Visit: Payer: Self-pay | Admitting: Neurology

## 2013-12-04 NOTE — Telephone Encounter (Signed)
Dr Krista Blue is out of the office,  Forwarding request to [facility]

## 2013-12-04 NOTE — Telephone Encounter (Signed)
Rx signed and faxed.

## 2013-12-06 ENCOUNTER — Other Ambulatory Visit: Payer: Self-pay | Admitting: Neurology

## 2013-12-18 ENCOUNTER — Encounter (HOSPITAL_COMMUNITY): Payer: Self-pay | Admitting: Pharmacy Technician

## 2013-12-20 ENCOUNTER — Inpatient Hospital Stay (HOSPITAL_COMMUNITY): Admission: RE | Admit: 2013-12-20 | Payer: Medicare Other | Source: Ambulatory Visit

## 2013-12-22 ENCOUNTER — Encounter (HOSPITAL_COMMUNITY)
Admission: RE | Admit: 2013-12-22 | Discharge: 2013-12-22 | Disposition: A | Discharge status: Home / Self Care | Payer: Medicare Other | Source: Ambulatory Visit | Attending: Surgery | Admitting: Surgery

## 2013-12-22 ENCOUNTER — Other Ambulatory Visit: Payer: Self-pay | Admitting: Cardiology

## 2013-12-22 ENCOUNTER — Encounter (HOSPITAL_COMMUNITY): Payer: Self-pay

## 2013-12-22 ENCOUNTER — Ambulatory Visit (INDEPENDENT_AMBULATORY_CARE_PROVIDER_SITE_OTHER): Payer: Medicare Other | Admitting: Pharmacist

## 2013-12-22 DIAGNOSIS — Z01812 Encounter for preprocedural laboratory examination: Secondary | ICD-10-CM | POA: Insufficient documentation

## 2013-12-22 DIAGNOSIS — I4891 Unspecified atrial fibrillation: Secondary | ICD-10-CM

## 2013-12-22 DIAGNOSIS — Z5181 Encounter for therapeutic drug level monitoring: Secondary | ICD-10-CM

## 2013-12-22 HISTORY — DX: Unspecified osteoarthritis, unspecified site: M19.90

## 2013-12-22 HISTORY — DX: Personal history of other malignant neoplasm of skin: Z85.828

## 2013-12-22 HISTORY — DX: Sleep apnea, unspecified: G47.30

## 2013-12-22 LAB — BASIC METABOLIC PANEL
BUN: 17 mg/dL (ref 6–23)
CALCIUM: 9.8 mg/dL (ref 8.4–10.5)
CO2: 29 mEq/L (ref 19–32)
Chloride: 101 mEq/L (ref 96–112)
Creatinine, Ser: 1.13 mg/dL — ABNORMAL HIGH (ref 0.50–1.10)
GFR calc Af Amer: 53 mL/min — ABNORMAL LOW (ref >90)
GFR, EST NON AFRICAN AMERICAN: 46 mL/min — AB (ref >90)
Glucose, Bld: 104 mg/dL — ABNORMAL HIGH (ref 70–99)
Potassium: 5.1 mEq/L (ref 3.7–5.3)
Sodium: 141 mEq/L (ref 137–147)

## 2013-12-22 LAB — APTT: APTT: 34 s (ref 24–37)

## 2013-12-22 LAB — CBC
HEMATOCRIT: 38.8 % (ref 36.0–46.0)
Hemoglobin: 12.6 g/dL (ref 12.0–15.0)
MCH: 29.5 pg (ref 26.0–34.0)
MCHC: 32.5 g/dL (ref 30.0–36.0)
MCV: 90.9 fL (ref 78.0–100.0)
PLATELETS: 209 10*3/uL (ref 150–400)
RBC: 4.27 MIL/uL (ref 3.87–5.11)
RDW: 15.2 % (ref 11.5–15.5)
WBC: 5.9 10*3/uL (ref 4.0–10.5)

## 2013-12-22 LAB — ABO/RH: ABO/RH(D): A POS

## 2013-12-22 LAB — PROTIME-INR
INR: 1.32 (ref 0.00–1.49)
PROTHROMBIN TIME: 16.1 s — AB (ref 11.6–15.2)

## 2013-12-22 LAB — POCT INR: INR: 1.5

## 2013-12-22 NOTE — Patient Instructions (Addendum)
Yolanda Spears  12/22/2013                           YOUR PROCEDURE IS SCHEDULED ON: 12/28/13               PLEASE REPORT TO SHORT STAY CENTER AT : 5:30 AM               CALL THIS NUMBER IF ANY PROBLEMS THE DAY OF SURGERY :               [phone]                                REMEMBER:   Do not eat food or drink liquids AFTER MIDNIGHT               Take these medicines the morning of surgery with A SIP OF WATER: HYDRALAZINE / METOPROLOL / RYTHMOL / LYRICA   Do not wear jewelry, make-up   Do not wear lotions, powders, or perfumes.   Do not shave legs or underarms 12 hrs. before surgery (men may shave face)  Do not bring valuables to the hospital.  Contacts, dentures or bridgework may not be worn into surgery.  Leave suitcase in the car. After surgery it may be brought to your room.  For patients admitted to the hospital more than one night, checkout time is            11:00 AM                                                     DISCONTINUE ASPIRIN (UNLESS  IT'S 81 MG) / HERBAL MEDS / COUMADIN [facility] - Preparing for Surgery Before surgery, you can play an important role.  Because skin is not sterile, your skin needs to be as free of germs as possible.  You can reduce the number of germs on your skin by washing with CHG (chlorahexidine gluconate) soap before surgery.  CHG is an antiseptic cleaner which kills germs and bonds with the skin to continue killing germs even after washing. Please DO NOT use if you have an allergy to CHG or antibacterial soaps.  If your skin becomes reddened/irritated stop using the CHG and inform your nurse when you arrive at Short Stay. Do not shave (including legs and underarms) for at least 48 hours prior to the first CHG shower.  You may shave your face. Please follow these instructions carefully:  1.  Shower with CHG Soap the night before surgery and the  morning of Surgery.   2.  If you choose  to wash your hair, wash your hair first as usual with your  normal  Shampoo.   3.  After you shampoo, rinse your hair and body thoroughly to remove the  shampoo.                                         4.  Use CHG as you would any other liquid soap.  You can apply chg directly  to the skin and wash . Gently wash with scrungie or clean wascloth    5.  Apply the CHG Soap to your body ONLY FROM THE NECK DOWN.   Do not use on open                           Wound or open sores. Avoid contact with eyes, ears mouth and genitals (private parts).                        Genitals (private parts) with your normal soap.              6.  Wash thoroughly, paying special attention to the area where your surgery  will be performed.   7.  Thoroughly rinse your body with warm water from the neck down.   8.  DO NOT shower/wash with your normal soap after using and rinsing off  the CHG Soap .                9.  Pat yourself dry with a clean towel.             10.  Wear clean pajamas.             11.  Place clean sheets on your bed the night of your first shower and do not  sleep with pets.  Day of Surgery : Do not apply any lotions/deodorants the morning of surgery.  Please wear clean clothes to the hospital/surgery center.  FAILURE TO FOLLOW THESE INSTRUCTIONS MAY RESULT IN THE CANCELLATION OF YOUR SURGERY    PATIENT SIGNATURE_________________________________

## 2013-12-25 ENCOUNTER — Encounter: Payer: Self-pay | Admitting: Podiatry

## 2013-12-25 ENCOUNTER — Ambulatory Visit (INDEPENDENT_AMBULATORY_CARE_PROVIDER_SITE_OTHER): Payer: Medicare Other | Admitting: Podiatry

## 2013-12-25 VITALS — BP 127/64 | HR 86 | Resp 18

## 2013-12-25 DIAGNOSIS — B351 Tinea unguium: Secondary | ICD-10-CM

## 2013-12-25 DIAGNOSIS — M79609 Pain in unspecified limb: Secondary | ICD-10-CM

## 2013-12-26 NOTE — Progress Notes (Signed)
Patient ID: Yolanda Spears, female   DOB: [birth year], 79 y.o.   MRN: [id number]  Subjective: This orientated x3 white female presents for ongoing debridement of painful mycotic  toenails. The last visit for this service was 10/03/2013  Objective: Elongated, hypertrophic, discolored toenails x10   Assessment: Symptomatic onychomycoses x10   Plan: Nails x10 debrided back without a bleeding. Reappoint at three-month intervals.

## 2013-12-27 ENCOUNTER — Telehealth (INDEPENDENT_AMBULATORY_CARE_PROVIDER_SITE_OTHER): Payer: Self-pay

## 2013-12-27 NOTE — Anesthesia Preprocedure Evaluation (Addendum)
Anesthesia Evaluation  Patient identified by MRN, date of birth, ID band Patient awake    Reviewed: Allergy & Precautions, H&P , NPO status , Patient's Chart, lab work & pertinent test results  Airway Mallampati: II TM Distance: <3 FB Neck ROM: Full    Dental no notable dental hx.    Pulmonary sleep apnea , former smoker,  breath sounds clear to auscultation  Pulmonary exam normal       Cardiovascular hypertension, Pt. on medications and Pt. on home beta blockers + dysrhythmias Atrial Fibrillation + pacemaker Rhythm:Regular Rate:Normal  Left ventricle: The cavity size was normal. Wall thickness   was normal. Systolic function was normal. The estimated   ejection fraction was in the range of 60% to 65%. Wall   motion was normal; there were no regional wall motion   abnormalities. Features are consistent with a pseudonormal   left ventricular filling pattern, with concomitant   abnormal relaxation and increased filling pressure (grade   2 diastolic dysfunction).    Neuro/Psych TIACVA negative psych ROS   GI/Hepatic negative GI ROS, Neg liver ROS,   Endo/Other  negative endocrine ROS  Renal/GU negative Renal ROS  negative genitourinary   Musculoskeletal negative musculoskeletal ROS (+)   Abdominal   Peds negative pediatric ROS (+)  Hematology negative hematology ROS (+)   Anesthesia Other Findings   Reproductive/Obstetrics negative OB ROS                        Anesthesia Physical Anesthesia Plan  ASA: III  Anesthesia Plan: General   Post-op Pain Management:    Induction: Intravenous  Airway Management Planned: Oral ETT  Additional Equipment:   Intra-op Plan:   Post-operative Plan: Extubation in OR  Informed Consent: I have reviewed the patients History and Physical, chart, labs and discussed the procedure including the risks, benefits and alternatives for the proposed anesthesia  with the patient or authorized representative who has indicated his/her understanding and acceptance.   Dental advisory given  Plan Discussed with: CRNA and Surgeon  Anesthesia Plan Comments:         Anesthesia Quick Evaluation

## 2013-12-27 NOTE — Telephone Encounter (Signed)
Patient called to ask for instruction for surgery for tomorrow , asking is it ok to eat dinner  tonight. Advised patient she is clear liquids only ,NO red foods,ie jello or Gatorade .She states she has  Started the ABT and MOM as instructed but she took the ABT with crackers and toast. Advised her not to eat anymore solids today,NPO after MN. Patient verbalized understanding .

## 2013-12-28 ENCOUNTER — Inpatient Hospital Stay (HOSPITAL_COMMUNITY)
Admission: RE | Admit: 2013-12-28 | Discharge: 2014-01-04 | DRG: 330 | Disposition: A | Discharge status: Skilled Nursing Facility | Payer: Medicare Other | Source: Ambulatory Visit | Attending: Surgery | Admitting: Surgery

## 2013-12-28 ENCOUNTER — Encounter (HOSPITAL_COMMUNITY): Payer: Medicare Other | Admitting: Anesthesiology

## 2013-12-28 ENCOUNTER — Inpatient Hospital Stay (HOSPITAL_COMMUNITY): Payer: Medicare Other | Admitting: Anesthesiology

## 2013-12-28 ENCOUNTER — Encounter (HOSPITAL_COMMUNITY)
Admission: RE | Disposition: A | Discharge status: Skilled Nursing Facility | Payer: Self-pay | Source: Ambulatory Visit | Attending: Surgery

## 2013-12-28 ENCOUNTER — Encounter (HOSPITAL_COMMUNITY): Payer: Self-pay

## 2013-12-28 DIAGNOSIS — R5381 Other malaise: Secondary | ICD-10-CM | POA: Diagnosis present

## 2013-12-28 DIAGNOSIS — K639 Disease of intestine, unspecified: Secondary | ICD-10-CM | POA: Diagnosis present

## 2013-12-28 DIAGNOSIS — Z8249 Family history of ischemic heart disease and other diseases of the circulatory system: Secondary | ICD-10-CM

## 2013-12-28 DIAGNOSIS — Z950 Presence of cardiac pacemaker: Secondary | ICD-10-CM

## 2013-12-28 DIAGNOSIS — M899 Disorder of bone, unspecified: Secondary | ICD-10-CM | POA: Diagnosis present

## 2013-12-28 DIAGNOSIS — E860 Dehydration: Secondary | ICD-10-CM | POA: Diagnosis not present

## 2013-12-28 DIAGNOSIS — R609 Edema, unspecified: Secondary | ICD-10-CM | POA: Diagnosis not present

## 2013-12-28 DIAGNOSIS — K520 Gastroenteritis and colitis due to radiation: Secondary | ICD-10-CM | POA: Diagnosis present

## 2013-12-28 DIAGNOSIS — T66XXXS Radiation sickness, unspecified, sequela: Secondary | ICD-10-CM

## 2013-12-28 DIAGNOSIS — Z87440 Personal history of urinary (tract) infections: Secondary | ICD-10-CM

## 2013-12-28 DIAGNOSIS — K565 Intestinal adhesions [bands], unspecified as to partial versus complete obstruction: Secondary | ICD-10-CM | POA: Diagnosis present

## 2013-12-28 DIAGNOSIS — I10 Essential (primary) hypertension: Secondary | ICD-10-CM | POA: Diagnosis present

## 2013-12-28 DIAGNOSIS — Z8049 Family history of malignant neoplasm of other genital organs: Secondary | ICD-10-CM

## 2013-12-28 DIAGNOSIS — G609 Hereditary and idiopathic neuropathy, unspecified: Secondary | ICD-10-CM | POA: Diagnosis present

## 2013-12-28 DIAGNOSIS — G4730 Sleep apnea, unspecified: Secondary | ICD-10-CM | POA: Diagnosis present

## 2013-12-28 DIAGNOSIS — H9319 Tinnitus, unspecified ear: Secondary | ICD-10-CM | POA: Diagnosis present

## 2013-12-28 DIAGNOSIS — E7800 Pure hypercholesterolemia, unspecified: Secondary | ICD-10-CM | POA: Diagnosis present

## 2013-12-28 DIAGNOSIS — Z880 Allergy status to penicillin: Secondary | ICD-10-CM

## 2013-12-28 DIAGNOSIS — Z803 Family history of malignant neoplasm of breast: Secondary | ICD-10-CM

## 2013-12-28 DIAGNOSIS — M129 Arthropathy, unspecified: Secondary | ICD-10-CM | POA: Diagnosis present

## 2013-12-28 DIAGNOSIS — Z860100 Personal history of colon polyps, unspecified: Secondary | ICD-10-CM

## 2013-12-28 DIAGNOSIS — Y842 Radiological procedure and radiotherapy as the cause of abnormal reaction of the patient, or of later complication, without mention of misadventure at the time of the procedure: Secondary | ICD-10-CM | POA: Diagnosis present

## 2013-12-28 DIAGNOSIS — R197 Diarrhea, unspecified: Secondary | ICD-10-CM | POA: Diagnosis not present

## 2013-12-28 DIAGNOSIS — Z8673 Personal history of transient ischemic attack (TIA), and cerebral infarction without residual deficits: Secondary | ICD-10-CM

## 2013-12-28 DIAGNOSIS — Z87891 Personal history of nicotine dependence: Secondary | ICD-10-CM

## 2013-12-28 DIAGNOSIS — R109 Unspecified abdominal pain: Secondary | ICD-10-CM | POA: Diagnosis present

## 2013-12-28 DIAGNOSIS — Z853 Personal history of malignant neoplasm of breast: Secondary | ICD-10-CM

## 2013-12-28 DIAGNOSIS — Z6832 Body mass index (BMI) 32.0-32.9, adult: Secondary | ICD-10-CM

## 2013-12-28 DIAGNOSIS — G589 Mononeuropathy, unspecified: Secondary | ICD-10-CM | POA: Diagnosis not present

## 2013-12-28 DIAGNOSIS — Z85828 Personal history of other malignant neoplasm of skin: Secondary | ICD-10-CM

## 2013-12-28 DIAGNOSIS — K56600 Partial intestinal obstruction, unspecified as to cause: Secondary | ICD-10-CM | POA: Diagnosis present

## 2013-12-28 DIAGNOSIS — Z01812 Encounter for preprocedural laboratory examination: Secondary | ICD-10-CM

## 2013-12-28 DIAGNOSIS — M949 Disorder of cartilage, unspecified: Secondary | ICD-10-CM

## 2013-12-28 DIAGNOSIS — I4891 Unspecified atrial fibrillation: Secondary | ICD-10-CM | POA: Diagnosis present

## 2013-12-28 DIAGNOSIS — N736 Female pelvic peritoneal adhesions (postinfective): Secondary | ICD-10-CM | POA: Diagnosis present

## 2013-12-28 DIAGNOSIS — Z91041 Radiographic dye allergy status: Secondary | ICD-10-CM

## 2013-12-28 DIAGNOSIS — Z5331 Laparoscopic surgical procedure converted to open procedure: Secondary | ICD-10-CM

## 2013-12-28 DIAGNOSIS — D649 Anemia, unspecified: Secondary | ICD-10-CM | POA: Diagnosis present

## 2013-12-28 DIAGNOSIS — K648 Other hemorrhoids: Secondary | ICD-10-CM

## 2013-12-28 DIAGNOSIS — Z9010 Acquired absence of unspecified breast and nipple: Secondary | ICD-10-CM

## 2013-12-28 DIAGNOSIS — Z8542 Personal history of malignant neoplasm of other parts of uterus: Secondary | ICD-10-CM

## 2013-12-28 DIAGNOSIS — E669 Obesity, unspecified: Secondary | ICD-10-CM | POA: Diagnosis present

## 2013-12-28 DIAGNOSIS — Z91013 Allergy to seafood: Secondary | ICD-10-CM

## 2013-12-28 DIAGNOSIS — Z888 Allergy status to other drugs, medicaments and biological substances status: Secondary | ICD-10-CM

## 2013-12-28 HISTORY — PX: LAPAROSCOPIC LYSIS INTESTINAL ADHESIONS: SUR778

## 2013-12-28 HISTORY — PX: SMALL INTESTINE SURGERY: SHX150

## 2013-12-28 HISTORY — PX: LAPAROSCOPIC LYSIS OF ADHESIONS: SHX5905

## 2013-12-28 HISTORY — PX: ABDOMINAL ADHESION SURGERY: SHX90

## 2013-12-28 HISTORY — DX: Intestinal adhesions (bands), unspecified as to partial versus complete obstruction: K56.50

## 2013-12-28 LAB — CREATININE, SERUM
Creatinine, Ser: 1.04 mg/dL (ref 0.50–1.10)
GFR calc non Af Amer: 51 mL/min — ABNORMAL LOW (ref >90)
GFR, EST AFRICAN AMERICAN: 59 mL/min — AB (ref >90)

## 2013-12-28 LAB — TYPE AND SCREEN
ABO/RH(D): A POS
Antibody Screen: NEGATIVE

## 2013-12-28 LAB — CBC
HEMATOCRIT: 37.5 % (ref 36.0–46.0)
Hemoglobin: 12.4 g/dL (ref 12.0–15.0)
MCH: 29.6 pg (ref 26.0–34.0)
MCHC: 33.1 g/dL (ref 30.0–36.0)
MCV: 89.5 fL (ref 78.0–100.0)
Platelets: 225 10*3/uL (ref 150–400)
RBC: 4.19 MIL/uL (ref 3.87–5.11)
RDW: 15.1 % (ref 11.5–15.5)
WBC: 16.7 10*3/uL — ABNORMAL HIGH (ref 4.0–10.5)

## 2013-12-28 LAB — PROTIME-INR
INR: 1.3 (ref 0.00–1.49)
Prothrombin Time: 15.9 seconds — ABNORMAL HIGH (ref 11.6–15.2)

## 2013-12-28 SURGERY — LYSIS, ADHESIONS, LAPAROSCOPIC
Anesthesia: General | Site: Abdomen

## 2013-12-28 MED ORDER — METOPROLOL TARTRATE 1 MG/ML IV SOLN
5.0000 mg | Freq: Four times a day (QID) | INTRAVENOUS | Status: DC | PRN
  Filled 2013-12-28: qty 5

## 2013-12-28 MED ORDER — MENTHOL 3 MG MT LOZG
1.0000 | LOZENGE | OROMUCOSAL | Status: DC | PRN
  Filled 2013-12-28: qty 9

## 2013-12-28 MED ORDER — ALUM & MAG HYDROXIDE-SIMETH 200-200-20 MG/5ML PO SUSP: 30.0000 mL | Freq: Four times a day (QID) | ORAL | Status: DC | PRN

## 2013-12-28 MED ORDER — BISACODYL 10 MG RE SUPP: 10.0000 mg | Freq: Two times a day (BID) | RECTAL | Status: DC | PRN

## 2013-12-28 MED ORDER — PROMETHAZINE HCL 25 MG/ML IJ SOLN: 6.2500 mg | INTRAMUSCULAR | Status: DC | PRN

## 2013-12-28 MED ORDER — HYDROMORPHONE HCL PF 1 MG/ML IJ SOLN
0.5000 mg | INTRAMUSCULAR | Status: DC | PRN
  Administered 2013-12-29: 1 mg via INTRAVENOUS
  Filled 2013-12-28: qty 1

## 2013-12-28 MED ORDER — GENTAMICIN SULFATE 40 MG/ML IJ SOLN
460.0000 mg | INTRAVENOUS | Status: AC
  Administered 2013-12-28: 460 mg via INTRAVENOUS
  Filled 2013-12-28: qty 11.5

## 2013-12-28 MED ORDER — DEXAMETHASONE SODIUM PHOSPHATE 10 MG/ML IJ SOLN
INTRAMUSCULAR | Status: DC | PRN
  Administered 2013-12-28: 10 mg via INTRAVENOUS

## 2013-12-28 MED ORDER — ROCURONIUM BROMIDE 100 MG/10ML IV SOLN
INTRAVENOUS | Status: DC | PRN
  Administered 2013-12-28 (×2): 5 mg via INTRAVENOUS
  Administered 2013-12-28: 30 mg via INTRAVENOUS
  Administered 2013-12-28: 5 mg via INTRAVENOUS

## 2013-12-28 MED ORDER — SODIUM CHLORIDE 0.9 % IR SOLN
Status: DC | PRN
  Administered 2013-12-28: 3000 mL

## 2013-12-28 MED ORDER — PROPOFOL 10 MG/ML IV BOLUS
INTRAVENOUS | Status: DC | PRN
  Administered 2013-12-28: 200 mg via INTRAVENOUS

## 2013-12-28 MED ORDER — FENTANYL CITRATE 0.05 MG/ML IJ SOLN
INTRAMUSCULAR | Status: AC
  Filled 2013-12-28: qty 5

## 2013-12-28 MED ORDER — HYDRALAZINE HCL 25 MG PO TABS
25.0000 mg | ORAL_TABLET | Freq: Two times a day (BID) | ORAL | Status: DC
  Administered 2013-12-28 – 2014-01-04 (×14): 25 mg via ORAL
  Filled 2013-12-28 (×15): qty 1

## 2013-12-28 MED ORDER — PHENYLEPHRINE HCL 10 MG/ML IJ SOLN
INTRAMUSCULAR | Status: DC | PRN
  Administered 2013-12-28 (×3): 40 ug via INTRAVENOUS
  Administered 2013-12-28: 80 ug via INTRAVENOUS
  Administered 2013-12-28: 20 ug via INTRAVENOUS
  Administered 2013-12-28: 80 ug via INTRAVENOUS

## 2013-12-28 MED ORDER — BUPIVACAINE ON-Q PAIN PUMP (FOR ORDER SET NO CHG)
INJECTION | Status: AC
  Filled 2013-12-28: qty 1

## 2013-12-28 MED ORDER — HEPARIN SODIUM (PORCINE) 5000 UNIT/ML IJ SOLN
INTRAMUSCULAR | Status: AC
  Filled 2013-12-28: qty 1

## 2013-12-28 MED ORDER — MAGIC MOUTHWASH
15.0000 mL | Freq: Four times a day (QID) | ORAL | Status: DC | PRN
  Filled 2013-12-28: qty 15

## 2013-12-28 MED ORDER — PHENOL 1.4 % MT LIQD
2.0000 | OROMUCOSAL | Status: DC | PRN
  Filled 2013-12-28: qty 177

## 2013-12-28 MED ORDER — HYDROMORPHONE HCL PF 1 MG/ML IJ SOLN
INTRAMUSCULAR | Status: DC | PRN
  Administered 2013-12-28 (×8): .2 mg via INTRAVENOUS

## 2013-12-28 MED ORDER — PROPOFOL 10 MG/ML IV BOLUS
INTRAVENOUS | Status: AC
  Filled 2013-12-28: qty 20

## 2013-12-28 MED ORDER — DIPHENHYDRAMINE HCL 50 MG/ML IJ SOLN: 12.5000 mg | Freq: Four times a day (QID) | INTRAMUSCULAR | Status: DC | PRN

## 2013-12-28 MED ORDER — DEXAMETHASONE SODIUM PHOSPHATE 10 MG/ML IJ SOLN
INTRAMUSCULAR | Status: AC
  Filled 2013-12-28: qty 1

## 2013-12-28 MED ORDER — ATROPINE SULFATE 0.4 MG/ML IJ SOLN
INTRAMUSCULAR | Status: AC
  Filled 2013-12-28: qty 2

## 2013-12-28 MED ORDER — GENTAMICIN IN SALINE 1-0.9 MG/ML-% IV SOLN: 100.0000 mg | INTRAVENOUS | Status: DC

## 2013-12-28 MED ORDER — SODIUM CHLORIDE 0.9 % IJ SOLN
INTRAMUSCULAR | Status: AC
  Filled 2013-12-28: qty 10

## 2013-12-28 MED ORDER — CLINDAMYCIN PHOSPHATE 900 MG/50ML IV SOLN
INTRAVENOUS | Status: AC
  Filled 2013-12-28: qty 50

## 2013-12-28 MED ORDER — NEOSTIGMINE METHYLSULFATE 1 MG/ML IJ SOLN
INTRAMUSCULAR | Status: DC | PRN
  Administered 2013-12-28: 5 mg via INTRAVENOUS

## 2013-12-28 MED ORDER — BUPIVACAINE-EPINEPHRINE 0.25% -1:200000 IJ SOLN
INTRAMUSCULAR | Status: DC | PRN
  Administered 2013-12-28: 50 mL

## 2013-12-28 MED ORDER — CHLORHEXIDINE GLUCONATE 4 % EX LIQD: 1.0000 "application " | Freq: Once | CUTANEOUS | Status: DC

## 2013-12-28 MED ORDER — GLYCOPYRROLATE 0.2 MG/ML IJ SOLN
INTRAMUSCULAR | Status: DC | PRN
  Administered 2013-12-28: .8 mg via INTRAVENOUS

## 2013-12-28 MED ORDER — EPHEDRINE SULFATE 50 MG/ML IJ SOLN
INTRAMUSCULAR | Status: AC
  Filled 2013-12-28: qty 1

## 2013-12-28 MED ORDER — ASPIRIN 81 MG PO CHEW
81.0000 mg | CHEWABLE_TABLET | Freq: Every day | ORAL | Status: DC
  Administered 2013-12-28 – 2014-01-04 (×8): 81 mg via ORAL
  Filled 2013-12-28 (×8): qty 1

## 2013-12-28 MED ORDER — HEMOSTATIC AGENTS (NO CHARGE) OPTIME
TOPICAL | Status: DC | PRN
  Administered 2013-12-28: 1 via TOPICAL

## 2013-12-28 MED ORDER — SACCHAROMYCES BOULARDII 250 MG PO CAPS
250.0000 mg | ORAL_CAPSULE | Freq: Two times a day (BID) | ORAL | Status: DC
  Administered 2013-12-28 – 2014-01-04 (×14): 250 mg via ORAL
  Filled 2013-12-28 (×15): qty 1

## 2013-12-28 MED ORDER — HEPARIN SODIUM (PORCINE) 5000 UNIT/ML IJ SOLN
5000.0000 [IU] | INTRAMUSCULAR | Status: AC
  Administered 2013-12-28: 5000 [IU] via SUBCUTANEOUS

## 2013-12-28 MED ORDER — SUCCINYLCHOLINE CHLORIDE 20 MG/ML IJ SOLN
INTRAMUSCULAR | Status: DC | PRN
  Administered 2013-12-28: 100 mg via INTRAVENOUS

## 2013-12-28 MED ORDER — KCL IN DEXTROSE-NACL 40-5-0.9 MEQ/L-%-% IV SOLN
INTRAVENOUS | Status: DC
  Administered 2013-12-28 – 2013-12-30 (×4): via INTRAVENOUS
  Filled 2013-12-28 (×10): qty 1000

## 2013-12-28 MED ORDER — CALCIUM CARBONATE ANTACID 500 MG PO CHEW
1.0000 | CHEWABLE_TABLET | Freq: Every day | ORAL | Status: DC
  Administered 2013-12-29: 500 via ORAL
  Administered 2013-12-30 – 2014-01-04 (×6): 200 mg via ORAL
  Filled 2013-12-28 (×7): qty 1

## 2013-12-28 MED ORDER — CLINDAMYCIN PHOSPHATE 900 MG/50ML IV SOLN
900.0000 mg | Freq: Three times a day (TID) | INTRAVENOUS | Status: AC
Start: 2013-12-28 — End: 2013-12-28
  Administered 2013-12-28: 900 mg via INTRAVENOUS
  Filled 2013-12-28: qty 50

## 2013-12-28 MED ORDER — DIPHENHYDRAMINE HCL 12.5 MG/5ML PO ELIX: 12.5000 mg | ORAL_SOLUTION | Freq: Four times a day (QID) | ORAL | Status: DC | PRN

## 2013-12-28 MED ORDER — GLYCOPYRROLATE 0.2 MG/ML IJ SOLN
INTRAMUSCULAR | Status: AC
  Filled 2013-12-28: qty 4

## 2013-12-28 MED ORDER — LIP MEDEX EX OINT
1.0000 "application " | TOPICAL_OINTMENT | Freq: Two times a day (BID) | CUTANEOUS | Status: DC
  Administered 2013-12-28 – 2014-01-04 (×14): 1 via TOPICAL
  Filled 2013-12-28: qty 7

## 2013-12-28 MED ORDER — NEOSTIGMINE METHYLSULFATE 1 MG/ML IJ SOLN
INTRAMUSCULAR | Status: AC
  Filled 2013-12-28: qty 10

## 2013-12-28 MED ORDER — LACTATED RINGERS IV BOLUS (SEPSIS)
1000.0000 mL | Freq: Three times a day (TID) | INTRAVENOUS | Status: AC | PRN
  Administered 2013-12-29: 1000 mL via INTRAVENOUS

## 2013-12-28 MED ORDER — BUPIVACAINE-EPINEPHRINE 0.25% -1:200000 IJ SOLN
INTRAMUSCULAR | Status: AC
  Filled 2013-12-28: qty 2

## 2013-12-28 MED ORDER — LACTATED RINGERS IV SOLN
INTRAVENOUS | Status: DC | PRN
  Administered 2013-12-28 (×2)
  Administered 2013-12-28: 07:00:00 via INTRAVENOUS

## 2013-12-28 MED ORDER — EPHEDRINE SULFATE 50 MG/ML IJ SOLN
INTRAMUSCULAR | Status: DC | PRN
  Administered 2013-12-28: 5 mg via INTRAVENOUS
  Administered 2013-12-28 (×2): 10 mg via INTRAVENOUS

## 2013-12-28 MED ORDER — CLINDAMYCIN PHOSPHATE 900 MG/50ML IV SOLN
900.0000 mg | INTRAVENOUS | Status: AC
  Administered 2013-12-28: 900 mg via INTRAVENOUS

## 2013-12-28 MED ORDER — HYDROMORPHONE HCL PF 1 MG/ML IJ SOLN: 0.2500 mg | INTRAMUSCULAR | Status: DC | PRN

## 2013-12-28 MED ORDER — HYDROMORPHONE HCL PF 2 MG/ML IJ SOLN
INTRAMUSCULAR | Status: AC
  Filled 2013-12-28: qty 1

## 2013-12-28 MED ORDER — BUPIVACAINE 0.25 % ON-Q PUMP DUAL CATH 300 ML
300.0000 mL | INJECTION | Status: DC
  Administered 2013-12-28: 300 mL
  Filled 2013-12-28: qty 300

## 2013-12-28 MED ORDER — FENTANYL CITRATE 0.05 MG/ML IJ SOLN
INTRAMUSCULAR | Status: DC | PRN
  Administered 2013-12-28 (×5): 50 ug via INTRAVENOUS

## 2013-12-28 MED ORDER — ACETAMINOPHEN 500 MG PO TABS
1000.0000 mg | ORAL_TABLET | Freq: Three times a day (TID) | ORAL | Status: DC
  Administered 2013-12-28 – 2014-01-04 (×21): 1000 mg via ORAL
  Filled 2013-12-28 (×25): qty 2

## 2013-12-28 MED ORDER — PROPAFENONE HCL 225 MG PO TABS
225.0000 mg | ORAL_TABLET | Freq: Three times a day (TID) | ORAL | Status: DC
  Administered 2013-12-28 – 2014-01-04 (×21): 225 mg via ORAL
  Filled 2013-12-28 (×28): qty 1

## 2013-12-28 MED ORDER — METOCLOPRAMIDE HCL 5 MG/ML IJ SOLN
INTRAMUSCULAR | Status: AC
  Filled 2013-12-28: qty 2

## 2013-12-28 MED ORDER — METOPROLOL TARTRATE 12.5 MG HALF TABLET
12.5000 mg | ORAL_TABLET | Freq: Two times a day (BID) | ORAL | Status: DC
  Administered 2013-12-28 – 2013-12-31 (×7): 12.5 mg via ORAL
  Filled 2013-12-28 (×9): qty 1

## 2013-12-28 MED ORDER — ALVIMOPAN 12 MG PO CAPS
12.0000 mg | ORAL_CAPSULE | Freq: Once | ORAL | Status: AC
  Administered 2013-12-28: 12 mg via ORAL
  Filled 2013-12-28: qty 1

## 2013-12-28 MED ORDER — LIDOCAINE HCL (CARDIAC) 20 MG/ML IV SOLN
INTRAVENOUS | Status: DC | PRN
  Administered 2013-12-28: 100 mg via INTRAVENOUS

## 2013-12-28 MED ORDER — ALVIMOPAN 12 MG PO CAPS
12.0000 mg | ORAL_CAPSULE | Freq: Two times a day (BID) | ORAL | Status: DC
  Administered 2013-12-29 – 2013-12-31 (×6): 12 mg via ORAL
  Filled 2013-12-28 (×10): qty 1

## 2013-12-28 MED ORDER — ONDANSETRON HCL 4 MG/2ML IJ SOLN
INTRAMUSCULAR | Status: DC | PRN
  Administered 2013-12-28: 4 mg via INTRAVENOUS

## 2013-12-28 MED ORDER — ONDANSETRON HCL 4 MG/2ML IJ SOLN
INTRAMUSCULAR | Status: AC
  Filled 2013-12-28: qty 2

## 2013-12-28 MED ORDER — LIDOCAINE HCL (CARDIAC) 20 MG/ML IV SOLN
INTRAVENOUS | Status: AC
  Filled 2013-12-28: qty 5

## 2013-12-28 MED ORDER — PREGABALIN 75 MG PO CAPS
75.0000 mg | ORAL_CAPSULE | Freq: Two times a day (BID) | ORAL | Status: DC
  Administered 2013-12-28 – 2014-01-04 (×14): 75 mg via ORAL
  Filled 2013-12-28 (×14): qty 1

## 2013-12-28 MED ORDER — PROMETHAZINE HCL 25 MG/ML IJ SOLN: 6.2500 mg | Freq: Four times a day (QID) | INTRAMUSCULAR | Status: DC | PRN

## 2013-12-28 MED ORDER — METOCLOPRAMIDE HCL 5 MG/ML IJ SOLN
INTRAMUSCULAR | Status: DC | PRN
  Administered 2013-12-28: 10 mg via INTRAVENOUS

## 2013-12-28 MED ORDER — ROCURONIUM BROMIDE 100 MG/10ML IV SOLN
INTRAVENOUS | Status: AC
  Filled 2013-12-28: qty 1

## 2013-12-28 MED ORDER — CLINDAMYCIN PHOSPHATE 600 MG/50ML IV SOLN
600.0000 mg | INTRAVENOUS | Status: DC
  Filled 2013-12-28: qty 50

## 2013-12-28 MED ORDER — HEPARIN SODIUM (PORCINE) 5000 UNIT/ML IJ SOLN
5000.0000 [IU] | Freq: Three times a day (TID) | INTRAMUSCULAR | Status: DC
  Administered 2013-12-28 – 2014-01-03 (×18): 5000 [IU] via SUBCUTANEOUS
  Filled 2013-12-28 (×21): qty 1

## 2013-12-28 SURGICAL SUPPLY — 80 items
APPLIER CLIP 5 13 M/L LIGAMAX5 (MISCELLANEOUS)
APPLIER CLIP ROT 10 11.4 M/L (STAPLE)
BLADE SURG SZ10 CARB STEEL (BLADE) ×3 IMPLANT
CABLE HIGH FREQUENCY MONO STRZ (ELECTRODE) ×6 IMPLANT
CATH KIT ON Q 7.5IN SLV (PAIN MANAGEMENT) ×6 IMPLANT
CELLS DAT CNTRL 66122 CELL SVR (MISCELLANEOUS) IMPLANT
CLIP APPLIE 5 13 M/L LIGAMAX5 (MISCELLANEOUS) IMPLANT
CLIP APPLIE ROT 10 11.4 M/L (STAPLE) IMPLANT
COUNTER NEEDLE 20 DBL MAG RED (NEEDLE) IMPLANT
COVER MAYO STAND STRL (DRAPES) ×3 IMPLANT
DECANTER SPIKE VIAL GLASS SM (MISCELLANEOUS) ×3 IMPLANT
DRAIN CHANNEL 19F RND (DRAIN) IMPLANT
DRAPE LAPAROSCOPIC ABDOMINAL (DRAPES) ×3 IMPLANT
DRAPE LG THREE QUARTER DISP (DRAPES) ×3 IMPLANT
DRAPE UTILITY XL STRL (DRAPES) ×6 IMPLANT
DRAPE WARM FLUID 44X44 (DRAPE) ×6 IMPLANT
DRSG OPSITE POSTOP 4X10 (GAUZE/BANDAGES/DRESSINGS) IMPLANT
DRSG OPSITE POSTOP 4X6 (GAUZE/BANDAGES/DRESSINGS) IMPLANT
DRSG OPSITE POSTOP 4X8 (GAUZE/BANDAGES/DRESSINGS) IMPLANT
DRSG TEGADERM 2-3/8X2-3/4 SM (GAUZE/BANDAGES/DRESSINGS) ×9 IMPLANT
DRSG TEGADERM 4X4.75 (GAUZE/BANDAGES/DRESSINGS) IMPLANT
ELECT PENCIL ROCKER SW 15FT (MISCELLANEOUS) ×6 IMPLANT
ELECT REM PT RETURN 15FT ADLT (MISCELLANEOUS) ×3 IMPLANT
ENDOLOOP SUT PDS II  0 18 (SUTURE)
ENDOLOOP SUT PDS II 0 18 (SUTURE) IMPLANT
GLOVE BIO SURGEON STRL SZ8 (GLOVE) ×6 IMPLANT
GLOVE BIOGEL PI IND STRL 7.0 (GLOVE) ×2 IMPLANT
GLOVE BIOGEL PI INDICATOR 7.0 (GLOVE) ×4
GLOVE ECLIPSE 8.0 STRL XLNG CF (GLOVE) ×6 IMPLANT
GLOVE INDICATOR 8.0 STRL GRN (GLOVE) ×6 IMPLANT
GLOVE SURG SIGNA 7.5 PF LTX (GLOVE) ×6 IMPLANT
GLOVE SURG SS PI 7.0 STRL IVOR (GLOVE) ×6 IMPLANT
GLOVE SURG SS PI 7.5 STRL IVOR (GLOVE) ×6 IMPLANT
GOWN STRL REUS W/TWL XL LVL3 (GOWN DISPOSABLE) ×21 IMPLANT
HEMOSTAT SNOW SURGICEL 2X4 (HEMOSTASIS) ×3 IMPLANT
KIT BASIN OR (CUSTOM PROCEDURE TRAY) ×3 IMPLANT
LEGGING LITHOTOMY PAIR STRL (DRAPES) ×3 IMPLANT
LUBRICANT JELLY K Y 4OZ (MISCELLANEOUS) IMPLANT
PACK GENERAL/GYN (CUSTOM PROCEDURE TRAY) IMPLANT
RELOAD PROXIMATE 75MM BLUE (ENDOMECHANICALS) ×3 IMPLANT
RTRCTR WOUND ALEXIS 18CM MED (MISCELLANEOUS)
SCISSORS LAP 5X35 DISP (ENDOMECHANICALS) ×3 IMPLANT
SEALER TISSUE G2 CVD JAW 35 (ENDOMECHANICALS) IMPLANT
SEALER TISSUE G2 CVD JAW 45CM (ENDOMECHANICALS)
SEALER TISSUE G2 STRG ARTC 35C (ENDOMECHANICALS) IMPLANT
SEPRAFILM PROCEDURAL PACK 3X5 (MISCELLANEOUS) ×3 IMPLANT
SET IRRIG TUBING LAPAROSCOPIC (IRRIGATION / IRRIGATOR) ×3 IMPLANT
SLEEVE SURGEON STRL (DRAPES) ×3 IMPLANT
SLEEVE XCEL OPT CAN 5 100 (ENDOMECHANICALS) ×9 IMPLANT
SPONGE GAUZE 4X4 12PLY (GAUZE/BANDAGES/DRESSINGS) ×3 IMPLANT
SPONGE LAP 18X18 X RAY DECT (DISPOSABLE) ×9 IMPLANT
STAPLER GUN LINEAR PROX 60 (STAPLE) ×3 IMPLANT
STAPLER PROXIMATE 75MM BLUE (STAPLE) ×9 IMPLANT
STAPLER VISISTAT 35W (STAPLE) ×3 IMPLANT
SUCTION POOLE TIP (SUCTIONS) ×6 IMPLANT
SUT MNCRL AB 4-0 PS2 18 (SUTURE) ×3 IMPLANT
SUT PDS AB 1 CTX 36 (SUTURE) IMPLANT
SUT PDS AB 1 TP1 96 (SUTURE) ×6 IMPLANT
SUT PROLENE 0 CT 2 (SUTURE) IMPLANT
SUT SILK 2 0 (SUTURE) ×2
SUT SILK 2 0 SH CR/8 (SUTURE) ×6 IMPLANT
SUT SILK 2-0 18XBRD TIE 12 (SUTURE) ×1 IMPLANT
SUT SILK 3 0 (SUTURE) ×2
SUT SILK 3 0 SH CR/8 (SUTURE) ×6 IMPLANT
SUT SILK 3-0 18XBRD TIE 12 (SUTURE) ×1 IMPLANT
SYR BULB IRRIGATION 50ML (SYRINGE) ×3 IMPLANT
SYS LAPSCP GELPORT 120MM (MISCELLANEOUS)
SYSTEM LAPSCP GELPORT 120MM (MISCELLANEOUS) IMPLANT
TAPE UMBILICAL COTTON 1/8X30 (MISCELLANEOUS) ×3 IMPLANT
TOWEL OR 17X26 10 PK STRL BLUE (TOWEL DISPOSABLE) ×6 IMPLANT
TOWEL OR NON WOVEN STRL DISP B (DISPOSABLE) ×6 IMPLANT
TRAY FOLEY CATH 14FRSI W/METER (CATHETERS) ×3 IMPLANT
TRAY LAP CHOLE (CUSTOM PROCEDURE TRAY) ×3 IMPLANT
TROCAR BLADELESS OPT 5 100 (ENDOMECHANICALS) ×3 IMPLANT
TROCAR XCEL NON-BLD 11X100MML (ENDOMECHANICALS) ×3 IMPLANT
TUBING CONNECTING 10 (TUBING) IMPLANT
TUBING CONNECTING 10' (TUBING)
TUBING FILTER THERMOFLATOR (ELECTROSURGICAL) ×3 IMPLANT
TUNNELER SHEATH ON-Q 16GX12 DP (PAIN MANAGEMENT) ×3 IMPLANT
YANKAUER SUCT BULB TIP 10FT TU (MISCELLANEOUS) ×6 IMPLANT

## 2013-12-28 NOTE — Transfer of Care (Signed)
Immediate Anesthesia Transfer of Care Note  Patient: Yolanda Spears  Procedure(s) Performed: Procedure(s): LAPAROSCOPIC LYSIS OF ADHESIONS converted OPEN SMALL BOWEL (N/A)  Patient Location: PACU  Anesthesia Type:General  Level of Consciousness: awake, alert , oriented and patient cooperative  Airway & Oxygen Therapy: Patient Spontanous Breathing and Patient connected to face mask oxygen  Post-op Assessment: Report given to PACU RN, Post -op Vital signs reviewed and stable and Patient moving all extremities  Post vital signs: Reviewed and stable  Complications: No apparent anesthesia complications

## 2013-12-28 NOTE — Op Note (Addendum)
12/28/2013  1:28 PM  PATIENT:  Yolanda Spears  79 y.o. 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  PROCEDURE:  Procedure(s): LAPAROSCOPIC LYSIS OF ADHESIONS x2 hours OPEN LYSIS OF ADHESIONS X 2 hours SMALL BOWEL RESECTION (ileum) SEROSAL REPAIRS X4 ON-Q PLACEMENT  SURGEON:  Surgeon(s): Adin Hector, MD Shann Medal, MD Georganna Skeans, MD  ANESTHESIA:   local and general, OnQ pump  EBL:  Total I/O In: 5277 [I.V.:4150] Out: 575 [Urine:250; Blood:325]  Delay start of Pharmacological VTE agent (>24hrs) due to surgical blood loss or risk of bleeding:  no  DRAINS: none   SPECIMEN:  Source of Specimen:  Distal ileum   DISPOSITION OF SPECIMEN:  PATHOLOGY  COUNTS:  YES  PLAN OF CARE: Admit to inpatient   PATIENT DISPOSITION:  PACU - hemodynamically stable.  INDICATION:    Pleasant elderly woman with numerous prior abdominal surgeries and pelvic radiation after hysterectomy for endometrial cancer.  She has had episodes of recurrent bowel obstruction.  She has evidence by CT scan of a transition point in the small intestine concerning for chronic obstruction.  No evidence of cancer current.  Therefore felt to be due to adhesions. Despite a bowel regimen & workup by gastroenterology, she still struggles.  I offered laparoscopic possible open lysis of adhesions.  Possibility of small bowel resection discussed as well.  She wished to proceed  I recommended segmental  resection:  The anatomy & physiology of the digestive tract was discussed.  The pathophysiology was discussed.  Natural history risks without surgery was discussed.   I worked to give an overview of the disease and the frequent need to have multispecialty involvement.  I feel the risks of no intervention will lead to serious problems that outweigh the operative risks; therefore, I recommended Abdominal expiration with lyses of adhesions.  Possible need for bowel resection discussed.  Laparoscopic & open techniques were discussed.    Risks such as bleeding, infection, abscess, leak, reoperation, possible ostomy, hernia, Leak, fistula, prolonged pain, heart attack, death, and other risks were discussed.  I noted a good likelihood this will help address the problem.   Goals of post-operative recovery were discussed as well.  We will work to minimize complications.  An educational handout on the pathology was given as well.  Questions were answered.    The patient expresses understanding & wishes to proceed with surgery.  OR FINDINGS:   Patient had Very dense adhesions of omentum and small intestine to the anterior abdominal wall.  Some soft but a fair percentage very hard and fibrotic.  The patient had very dense interloop adhesions some areas saw some areas very dense.  Worst area was in the retroperitoneum right greater than left along the iliacs.  Very dense concrete-like adhesions to the retroperitoneum.  Very significant interloop folds and adhesions here with a significant kinking.  Most likely transition point in the right lower quadrant.  Distal foot of ileum thickened with some creeping fat.  Rectum/pelvis quite spared.  No obvious metastatic disease on visceral parietal peritoneum or liver.  An ileoileal anastomosis that rests in  the RLQ pelvic brim.  DESCRIPTION:   Informed consent was confirmed.  The patient underwent general anaesthesia without difficulty.  The patient was positioned  with arms tucked & secured appropriately.  VTE prevention in place.  The patient's abdomen was clipped, prepped, & draped in a sterile fashion.  Surgical timeout confirmed our plan.  The patient was positioned in reverse Trendelenburg.  Abdominal entry was gained using optical entry technique in the  left upper abdomen.  Entry was clean.  I induced carbon dioxide insufflation.  Camera inspection revealed no injury.  Patient had very dense adhesions to the anterior abdominal wall.  I placed another port in the left subxiphoid region.  I did sharp dissection until I could place ports in the left flank and left lower quadrant.  I continued laparoscopic lysis of adhesions to free all greater omentum and small intestine off the anterior abdominal wall.  Mostly greater omentum supraumbilically.  However the right upper quadrant and especially right lower quadrant and anterior pelvis had dense small bowel adhesions to that anterior abdominal wall.  There were certain sections in the infraumbilical midline were added up going into the preperitoneal space to free things off to avoid bowel injury.  After a couple of hours, I was able to free them off.  I began to mobilize the greater omentum off the small intestine.  However the adhesions were even more dense there.  The whole small intestine was frozen and one large mass.  Therefore decided to convert to open through a periumbilical midline incision.  We carefully freed the greater omentum off the small intestine.  I used sharp cold scissors.  I then began to meticulously free off the very dense interloop adhesions.  The patient had very dense adhesions to the left retroperitoneum and pelvic brim.  I carefully was able to come behind the mesentery.  The worst adhesions were to small bowel serosa.  I did eventually mobilize the bowel out of the left retroperitoneum and lower quadrant.  I repaired some areas of thinning on the small bowel serosa.  We then focused  attention to the right lower quadrant.  The adhesions were worse there.  It took some time, but we eventually freed all that off.  Did have evidence of enterotomy in the distal ileum.  This area was somewhat thickened / inflamed.  While I did not see massive radiation thickening of the ileum, there was moderate creeping fat and some thickening there.  Therefore, I decided to resect the distal foot and a half of ileum, the worst of the small intestine.  More proximally, the small intestine was much softer with less creeping fat.  I did a GIA stapler at both ends.  This ileal resected mesentery was thinned out and quite twisted/corkscrewed.  Resected ileum using clamps, silk ties, and suture ligature.  There was just enough terminal ileum to do a side-to-side stapled anastomosis.  I stapled off the common defect using a TX 60 stapler.  I closed the mesenteric common defect using 2-0 silk stitches.  We did copious irrigation.  The left retroperitoneum the psoas muscle had some oozing on it.  I controlled a few areas with that point cautery and eventually laid a SNOW pad for hemostasis.  We ran the small intestine numerous time.  Small areas of serosa were repaired transversely in 4 locations.  The proximal half had had no adhesions.  The distal half of the small intestine had been involved with the dense adhesions to  the anterior abdominal wall, interloop, retroperitoneum.   Overall, the remaining SB looked healthy and intact.  The anastomosis looked healthy and intact.  I mobilized the greater omentum out of the right upper quadrant and liver.  At that help mobilize it much better down so it reached down into the pelvis.  Again more copious irrigation.  I used Seprafilm pack to lay over small intestine away from the anastomosis.  I then laid greater omentum over the bowel.  I placed On-Q catheter sheaths.  I closed the fascia using #1 looped PDS.  Close the port site and skin using 4 Vicryl stitch.  I did live  gaps in the midline closure to allow chlorhexidine-soaked umbilical tape wicks in the wound.  Sterile dressings applied.  On-Q catheters placed and sheath peeled away.  Patient's extubated recovery room.  She is stable making urine.  No evidence hematuria.  I discussed interoperative findings with the patient's family.  Goals the postop recovery were discussed.  Questions were answered.  They expressed understanding appreciation.

## 2013-12-28 NOTE — H&P (Signed)
[place], MD, [address] [place], [place] [address] Phone: [phone] FAX: [phone]     JOLAINE FRYBERGER  08-01-1935 [id number]  CARE TEAM:  PCP: Thressa Sheller, MD  Outpatient Care Team: Patient Care Team: Thressa Sheller, MD as PCP - General [name], MD as Consulting Physician (Gynecologic Oncology) Darlin Coco, MD as Consulting Physician (Cardiology) Jerene Bears, MD as Consulting Physician (Gastroenterology) Adin Hector, MD as Consulting Physician (General Surgery)  Inpatient Treatment Team: Treatment Team: Attending Provider: Adin Hector, MD  This patient is a 79 y.o.female who presents today for surgical evaluation at the request of Dr. Hilarie Fredrickson.   Reason for visit: Recurring episodes of abdominal pain. Probable chronic partial small bowel obstruction. Consideration of surgery.   Pleasant obese elderly somewhat deconditioned female. Developed endometrial cancer that required hysterectomy. Develop some type of large pelvic cyst that required resection as well. Eventually recovered.   She is to was intermittent episodes of abdominal pain and vomiting. Goes through constipation and diarrhea. Has had colonoscopies by Dr. Penne Lash. She tells me they are normal. Now followed by low-power gastroenterology. In the past 5 years, she gets attacks of lower [place] pain. The nausea and vomiting. Pain reduces. Then she has explosive diarrhea. The episodes are happening much more frequently now. More likely to have more intense episodes as well. Gastroenterology has been trying to help troubleshoot this. CT scan concerning for a transition point in the pelvis. Concern for probable chronic intermittent obstruction. She has been to the emergency room and admitted a few times for this. Surgical consultation requested.   The patient comes in today with her son. She walks with a cane. Appetite  okay. Weight relatively stable. No personal nor family history of GI/colon cancer, inflammatory bowel disease, irritable bowel syndrome, allergy such as Celiac Sprue, dietary/dairy problems, colitis, ulcers nor gastritis. No recent sick contacts/gastroenteritis. No travel outside the country. No changes in diet. No dysphagia to solids or liquids. No significant heartburn or reflux. No hematochezia, hematemesis, coffee ground emesis. No evidence of prior gastric/peptic ulceration.   No new events.   Past Medical History  Diagnosis Date  . Pacemaker -[employer]   . Hypertension   . Peripheral neuropathy   . High cholesterol   . Heart murmur   . Shortness of breath on exertion   . Atrial fibrillation   . History of radiation therapy   . Partial bowel obstruction JAN/2013    recurrent  . Colon polyp 2011  . Osteopenia 02/2013    T score -1.4 FRAX 11%/2.1%  . Stroke     "small stroke & several TIA's occasional visual problems / and occasional speach problems  . Arthritis   . Personal history of skin cancer   . Urinary leakage   . Hx: UTI (urinary tract infection)   . Sleep apnea     unaable to tolerate mask   . Endometrial cancer 2006    Stage IIIc  s/p radiation  . Breast cancer 1999    LOBULAR CARCINOMA IN SITU...  . Ringing in ears     Past Surgical History  Procedure Laterality Date  . Lymph node resection  2007  . Cholecystectomy  1990's  . Mastectomy  1999    bilaterally w/lymph node dissection  . Abdominal hysterectomy  2006    BSO  . Oophorectomy      BSO  . Exploratory laparotomy  2007    remove abdominal cyst  . Insert / replace / remove pacemaker  2007    initial placement  2007 / REPLACED 2014       History   Social History  . Marital Status: Widowed    Spouse Name: N/A    Number of Children: 2  . Years of Education: N/A   Occupational History  . Retired    Social History Main Topics  . Smoking status: Former Smoker -- 1.00 packs/day for 40 years     Types: Cigarettes    Quit date: 11/26/1998  . Smokeless tobacco: Never Used  . Alcohol Use: No     Comment: 09/14/11 "last drink ~ 08/31/2009"  . Drug Use: No  . Sexual Activity: No   Other Topics Concern  . Not on file   Social History Narrative   Widowed mother of 2    Family History  Problem Relation Age of Onset  . Cancer Mother 72    UTERINE  . Breast cancer Mother 22  . Hypertension Mother   . Uterine cancer Mother   . Cancer Father     PROSTATE  . Hypertension Father     Current Facility-Administered Medications  Medication Dose Route Frequency Provider Last Rate Last Dose  . chlorhexidine (HIBICLENS) 4 % liquid 1 application  1 application Topical Once Adin Hector, MD      . Derrill Memo ON 12/29/2013] chlorhexidine (HIBICLENS) 4 % liquid 1 application  1 application Topical Once Adin Hector, MD         Allergies  Allergen Reactions  . Other     Contrast dye   . Penicillins Itching  . Shellfish-Derived Products Nausea And Vomiting  . Statins     Myalgias,elevated LFT studies  . Iohexol Hives and Itching    Itching in eyes, one hive on face.    Review of Systems  Constitutional: Negative for fever, chills, diaphoresis, appetite change and fatigue.  HENT: Negative for ear discharge, ear pain, sore throat and trouble swallowing.  Eyes: Negative for photophobia, discharge and visual disturbance.  Respiratory: Positive for shortness of breath. Negative for cough, choking, chest tightness, wheezing and stridor.  Cardiovascular: Positive for palpitations and leg swelling. Negative for chest pain.  Gastrointestinal: Positive for abdominal pain, diarrhea, constipation and abdominal distention. Negative for nausea, vomiting, anal bleeding and rectal pain.  Endocrine: Negative for cold intolerance and heat intolerance.  Genitourinary: Negative for dysuria, frequency and difficulty urinating.  Musculoskeletal: Negative for gait problem, myalgias and neck pain.   Skin: Negative for color change, pallor and rash.  Allergic/Immunologic: Negative for environmental allergies, food allergies and immunocompromised state.  Neurological: Negative for dizziness, speech difficulty, weakness and numbness.  Hematological: Negative for adenopathy.  Psychiatric/Behavioral: Negative for confusion and agitation. The patient is not nervous/anxious.      BP 149/50  Pulse 83  Temp(Src) 97.7 F (36.5 C) (Oral)  Resp 16  SpO2 93%  Physical Exam: General: Pt awake/alert/oriented x4 in no major acute distress Eyes: PERRL, normal EOM. Sclera nonicteric Neuro: CN II-XII intact w/o focal sensory/motor deficits. Lymph: No head/neck/groin lymphadenopathy Psych:  No delerium/psychosis/paranoia HENT: Normocephalic, Mucus membranes moist.  No thrush Neck: Supple, No tracheal deviation Chest: No pain.  Good respiratory excursion. CV:  Pulses intact.  Regular rhythm Abdomen: Soft, Nondistended.  Min tender.  No incarcerated hernias. Ext:  SCDs BLE.  No significant edema.  No cyanosis Skin: No petechiae / purpurea.  No major sores Musculoskeletal: No  severe joint pain.  Good ROM major joints   Results:   Labs: No results found for this or any previous visit (from the past 48 hour(s)).  Imaging / Studies: No results found.  Medications / Allergies: per chart  Antibiotics: Anti-infectives   None      Assessment  Yolanda Spears  79 y.o. female  Day of Surgery  Procedure(s): LAPAROSCOPIC LYSIS OF ADHESIONS(POSSIBLE OPEN) POSSIBLE SMALL BOWEL RESECTION  Problem List:  Active Problems:   * No active hospital problems. *  Assessment:   Recurrent episodes of bloating pain and diarrhea suspicious for partial small bowel obstruction that is chronic. Probably related to prior radiation with thickened/strictured intestine & Adhesions from prior operations   Plan:   Laparoscopic exploration and lysis of adhesions to find a transition point. I suspect she  may need a small bowel resection to remove the thickened intestine seen on the CAT scan. Would do that if there was a short segment. Would hold off if it is a massive section. CAT scan implies a short region.   With her health issues, her risk is above-average. The biggest concern I have is a risk of delayed injuries/week/ostomy if she has significant adhesions from her prior major pelvic surgery and radiation. However, I see no other options since she has been compliant on about regimen and has had appropriate care with her primary care physician and gastroenterologist. She seems quite miserable. She understands the risks. She wishes to give surgery a try:  The anatomy & physiology of the digestive tract was discussed. The pathophysiology of intestinal obstruction was discussed. Natural history risks without surgery was discussed. I feel the patient has failed non-operative therapies. The risks of no intervention will lead to serious problems such as necrosis, perforation, dehydration, etc. that outweigh the operative risks; therefore, I recommended abdominal exploration to diagnose & treat the source of the problem. Laparoscopic & open techniques were discussed. I expressed a good likelihood that surgery will treat the problem.  Risks such as bleeding, infection, abscess, leak, reoperation, bowel resection, possible ostomy, fistula, hernia, heart attack, death, and other risks were discussed. I noted a good likelihood this will help address the problem. I did note that there is a possibility this does not help at all and she has other reasons for her digestive tract/abdominal complaints. Goals of post-operative recovery were discussed as well. We will work to minimize complications. Questions were answered. The patient expresses understanding & wishes to proceed with surgery.   -VTE prophylaxis- SCDs, etc  -mobilize as tolerated to help recovery    Adin Hector, M.D., F.A.C.S. Gastrointestinal and  Minimally Invasive Surgery [facility] [address] [place], [place] [address] [phone] Main / Paging   12/28/2013  Note: This dictation was prepared with Dragon/digital dictation along with [facility] technology. Any transcriptional errors that result from this process are unintentional.

## 2013-12-28 NOTE — Anesthesia Postprocedure Evaluation (Signed)
  Anesthesia Post-op Note  Patient: Yolanda Spears  Procedure(s) Performed: Procedure(s) (LRB): LAPAROSCOPIC LYSIS OF ADHESIONS converted OPEN SMALL BOWEL (N/A)  Patient Location: PACU  Anesthesia Type: General  Level of Consciousness: awake and alert   Airway and Oxygen Therapy: Patient Spontanous Breathing  Post-op Pain: mild  Post-op Assessment: Post-op Vital signs reviewed, Patient's Cardiovascular Status Stable, Respiratory Function Stable, Patent Airway and No signs of Nausea or vomiting  Last Vitals:  Filed Vitals:   12/28/13 1445  BP: 153/66  Pulse: 64  Temp:   Resp: 16    Post-op Vital Signs: stable   Complications: No apparent anesthesia complications

## 2013-12-29 ENCOUNTER — Encounter (HOSPITAL_COMMUNITY): Payer: Self-pay | Admitting: Surgery

## 2013-12-29 LAB — BASIC METABOLIC PANEL
BUN: 21 mg/dL (ref 6–23)
CO2: 23 mEq/L (ref 19–32)
Calcium: 8 mg/dL — ABNORMAL LOW (ref 8.4–10.5)
Chloride: 101 mEq/L (ref 96–112)
Creatinine, Ser: 1.3 mg/dL — ABNORMAL HIGH (ref 0.50–1.10)
GFR calc Af Amer: 45 mL/min — ABNORMAL LOW (ref >90)
GFR, EST NON AFRICAN AMERICAN: 39 mL/min — AB (ref >90)
GLUCOSE: 155 mg/dL — AB (ref 70–99)
POTASSIUM: 5.2 meq/L (ref 3.7–5.3)
Sodium: 134 mEq/L — ABNORMAL LOW (ref 137–147)

## 2013-12-29 LAB — CBC
HCT: 31.2 % — ABNORMAL LOW (ref 36.0–46.0)
Hemoglobin: 10.4 g/dL — ABNORMAL LOW (ref 12.0–15.0)
MCH: 29.9 pg (ref 26.0–34.0)
MCHC: 33.3 g/dL (ref 30.0–36.0)
MCV: 89.7 fL (ref 78.0–100.0)
Platelets: 196 10*3/uL (ref 150–400)
RBC: 3.48 MIL/uL — AB (ref 3.87–5.11)
RDW: 15.4 % (ref 11.5–15.5)
WBC: 11.9 10*3/uL — AB (ref 4.0–10.5)

## 2013-12-29 LAB — MAGNESIUM: MAGNESIUM: 1.6 mg/dL (ref 1.5–2.5)

## 2013-12-29 MED ORDER — FENTANYL CITRATE 0.05 MG/ML IJ SOLN
25.0000 ug | INTRAMUSCULAR | Status: DC | PRN
Start: 2013-12-29 — End: 2014-01-04
  Administered 2013-12-29 – 2013-12-30 (×2): 50 ug via INTRAVENOUS
  Filled 2013-12-29 (×2): qty 2

## 2013-12-29 MED ORDER — BIOTENE DRY MOUTH MT LIQD
15.0000 mL | Freq: Two times a day (BID) | OROMUCOSAL | Status: DC
  Administered 2013-12-29 – 2014-01-03 (×11): 15 mL via OROMUCOSAL

## 2013-12-29 NOTE — Progress Notes (Signed)
Patient's blood pressure measured in right lower arm was 94/29.  Consulted with RN and additional BP in each upper arm was to be performed.  R upper arm BP measured 107/48.  L upper arm BP measured 118/39.  Consult with RN about medication administration and need to withhold BP meds.   Filed Vitals:   12/29/13 0900  BP: 94/29  Pulse: 72  Temp:   Resp:     Amritha Yorke Hilliard-Ziemba SN, RCC

## 2013-12-29 NOTE — Progress Notes (Signed)
Patient was lying in bed watching tv when entered the room.  Patient had no complaints.  Patient was very talkative and eager to learn about condition and what she can do to further healing.  Patient was oriented x 4 and has used incentive spirometer several times this morning.  She has slightly diminished lower lung sounds bilaterally upon auscultation but performs breathing unlabored.  Inspection of incision showed no signs of redness or swelling.  Looked clean and dry.  Patient has urinary catheter that appears to be draining yellow, clear fluid.  Output has been measured at 160 mL. Patient has been drinking fluids and had a popsickle for lunch.  Patient has refused ice and has wanted to remain on heating pad over abdomen.  Patient is semi-fowlers with eyes shut by end of assessment.    Bri Wakeman [facility]illiard-[facility] SN, RCC

## 2013-12-29 NOTE — Progress Notes (Signed)
Patient walked down and around to the corner of the other hallway. Tolerated it well, got a little short breath on way back, but we stopped and she was ok after a brief rest. Will continue to encourage patient to walk. Patton Salles :) RN

## 2013-12-29 NOTE — Progress Notes (Signed)
I have reviewed all documentation from student nurse. 

## 2013-12-29 NOTE — Progress Notes (Signed)
PT Cancellation Note  Patient Details Name: Yolanda Spears MRN: [id number] DOB: 28-Apr-1935   Cancelled Treatment:    Reason Eval/Treat Not Completed: Fatigue/lethargy limiting ability to participate  Pt reports just getting up to chair and then back to bed.  States neuropathy pain in B LE is limiting her ability to mobilize at this time and requests PT check back later.   Junius Argyle 12/29/2013, 10:16 AM Carmelia Bake, PT, DPT 12/29/2013 Pager: [phone]

## 2013-12-29 NOTE — Evaluation (Signed)
Occupational Therapy Evaluation Patient Details Name: Yolanda Spears MRN: [id number] DOB: Nov 14, 1934 Today's Date: 12/29/2013    History of Present Illness pt was admitted for recurrent abdominal pain.  She is s/p lysis of adhesions, SBO resection for chronic partial SBO   Clinical Impression   This 79 year old female was admitted for the above.  She was mod I with ADLs prior to admission, living alone. She is now overall mod A x 2 for mobility and LB adls. She will benefit from skilled OT to increase safety and independence with adls.  Goals in acute are for min A overall.      Follow Up Recommendations  SNF    Equipment Recommendations   (possibly 3:1 commode; son states he will install higher toilet if needed)    Recommendations for Other Services       Precautions / Restrictions Precautions Precautions: Fall Restrictions Weight Bearing Restrictions: No      Mobility Bed Mobility Overal bed mobility: Needs Assistance;+2 for physical assistance Bed Mobility: Rolling;Sidelying to Sit Rolling: Mod assist Sidelying to sit: +2 for physical assistance;Mod assist       General bed mobility comments: cued for arm use for bed mobility, but pt asked son to come over and help pull her up  Transfers Overall transfer level: Needs assistance Equipment used: 2 person hand held assist;None Transfers: Sit to/from Stand Sit to Stand: Mod assist         General transfer comment: cues for hand placement    Balance                                            ADL Overall ADL's : Needs assistance/impaired     Grooming: Set up;Sitting   Upper Body Bathing: Supervision/ safety;Sitting   Lower Body Bathing: +2 for physical assistance;Moderate assistance;Sit to/from stand   Upper Body Dressing : Minimal assistance;Sitting (lines)   Lower Body Dressing: +2 for physical assistance;Moderate assistance;With adaptive equipment;Sit to/from stand   Toilet  Transfer: +2 for physical assistance;Moderate assistance (mod A to stand; min A pivotal steps to chair)             General ADL Comments: pt asked son to assist with bed mobility and transfers.  Preferred to hold/pull up on his hand rather than both hands on bed.  Educated on Public affairs consultant for adls.  Pt has a Secondary school teacher at home.  Needs reinforcement.     Vision                     Perception     Praxis      Pertinent Vitals/Pain Sore, abdomen.  repositioned     Hand Dominance     Extremity/Trunk Assessment Upper Extremity Assessment Upper Extremity Assessment: Overall WFL for tasks assessed    Pt has a h/o bil peripheral neuropathy in LEs       Communication Communication Communication: No difficulties   Cognition Arousal/Alertness: Awake/alert Behavior During Therapy: WFL for tasks assessed/performed Overall Cognitive Status: Within Functional Limits for tasks assessed                     General Comments       Exercises       Shoulder Instructions      Home Living Family/patient expects to be discharged to:: Private residence Living  Arrangements: Alone Available Help at Discharge:  (daughter available 24/7 x 1 week) Type of Home: House Home Access: Stairs to enter [employer] of Steps: 1   Home Layout: One level     Bathroom Shower/Tub: Occupational psychologist: Standard     Home Equipment: Cane - single point;Grab bars - toilet;Grab bars - tub/shower;Shower seat          Prior Functioning/Environment Level of Independence: Independent with assistive device(s)        Comments: uses cane outside; furniture walks inside    OT Diagnosis: Generalized weakness   OT Problem List: Decreased strength;Decreased activity tolerance;Impaired balance (sitting and/or standing);Decreased knowledge of use of DME or AE;Decreased knowledge of precautions;Pain;Impaired sensation   OT Treatment/Interventions:  Self-care/ADL training;DME and/or AE instruction;Therapeutic activities;Patient/family education;Balance training    OT Goals(Current goals can be found in the care plan section) Acute Rehab OT Goals Patient Stated Goal: get moving better, get stronger, go home OT Goal Formulation: With patient Time For Goal Achievement: 01/12/14 Potential to Achieve Goals: Good ADL Goals Pt Will Perform Lower Body Bathing: with min assist;with adaptive equipment;sit to/from stand Pt Will Perform Lower Body Dressing: with min assist;with adaptive equipment;sit to/from stand Pt Will Transfer to Toilet: with min assist;ambulating;bedside commode;grab bars (vs comfort height commode) Pt Will Perform Toileting - Clothing Manipulation and hygiene: with min assist;sit to/from stand Additional ADL Goal #1: pt will perform bed mobility with min A in preparation for toilet transfers  OT Frequency: Min 2X/week   Barriers to D/C:            Co-evaluation              End of Session Nurse Communication: Mobility status  Activity Tolerance: Patient tolerated treatment well Patient left: in chair;with call bell/phone within reach;with family/visitor present   Time: 1007-1219 OT Time Calculation (min): 29 min Charges:  OT General Charges $OT Visit: 1 Procedure OT Evaluation $Initial OT Evaluation Tier I: 1 Procedure OT Treatments $Self Care/Home Management : 8-22 mins $Therapeutic Activity: 8-22 mins G-Codes:    Lesle Chris 2014/01/10, 4:10 PM  Lesle Chris, OTR/L [phone] Jan 10, 2014

## 2013-12-29 NOTE — Progress Notes (Signed)
Patient lying in bed when entered the room.  Patient agreed to try to walk.  Patient with assistance was able to get out of bed and walk several steps to chair in room.  Patient was fatigued from exertion.  Patient sat in chair for 10 minutes and then asked to be put back in bed.  Linens were changed and patient performed facial hygiene while seated in chair. She complained of only incisional pain of 1 (scale 1-10).   SCD and oxygen reapplied with positioned in bed.    Michiah Mudry Hilliard-[facility]iemba SN, RCC

## 2013-12-29 NOTE — Progress Notes (Signed)
Went to d/c foley cath this am and patient only had 195 cc of urine.  Will give LR bolus per prn orders and then reassess urine output before taking foley cath out.   [place]

## 2013-12-29 NOTE — Progress Notes (Signed)
[place], MD, [address] [place], [place] [address] Phone: [phone] FAX: [phone]    Yolanda Spears [id number] September 24, 1934  CARE TEAM:  PCP: Thressa Sheller, MD  Outpatient Care Team: Patient Care Team: Thressa Sheller, MD as PCP - General [name], MD as Consulting Physician (Gynecologic Oncology) Darlin Coco, MD as Consulting Physician (Cardiology) Jerene Bears, MD as Consulting Physician (Gastroenterology) Adin Hector, MD as Consulting Physician (General Surgery)  Inpatient Treatment Team: Treatment Team: Attending Provider: Adin Hector, MD; Technician: Naomie Dean, NT; Registered Nurse: Clenton Pare, RN  Subjective:  Loopy with Dilaudid Tolerating sips Sat at bedside Low UOP - IVF bolus Dry mouth - using lip balm  Objective:  Vital signs:  Filed Vitals:   12/28/13 1624 12/28/13 2217 12/29/13 0233 12/29/13 0531  BP: 145/80 101/38 123/39 119/34  Pulse: 79 65 71 74  Temp: 97.5 F (36.4 C) 97.8 F (36.6 C) 97.9 F (36.6 C) 98.4 F (36.9 C)  TempSrc:  Oral Oral Oral  Resp: 18 16 16 16   Height: 5' 7"  (1.702 m)     Weight: 214 lb 11.7 oz (97.4 kg)   208 lb 1.8 oz (94.4 kg)  SpO2: 96% 100% 99% 100%    Last BM Date: 12/28/13  Intake/Output   Yesterday:  04/23 0701 - 04/24 0700 In: 6106.7 [P.O.:140; I.V.:5916.7; IV Piggyback:50] Out: 905 [Urine:580; Blood:325] This shift:  Total I/O In: 346.7 [P.O.:80; I.V.:216.7; IV Piggyback:50] Out: 270 [Urine:270]  Bowel function:  Flatus: n  BM: n  Drain: n/a  Physical Exam:  General: Pt awake/alert/oriented x4 in no acute distress Eyes: PERRL, normal EOM.  Sclera clear.  No icterus Neuro: CN II-XII intact w/o focal sensory/motor deficits. Lymph: No head/neck/groin lymphadenopathy Psych:  No delerium/psychosis/paranoia.  Very chatty but pleasant HENT: Normocephalic, Mucus membranes moist.  No  thrush Neck: Supple, No tracheal deviation Chest: No chest wall pain w good excursion CV:  Pulses intact.  Regular rhythm MS: Normal AROM mjr joints.  No obvious deformity Abdomen: Soft.  Nondistended.  Mod tender at incision.  No blood. No evidence of peritonitis.  No incarcerated hernias. Ext:  SCDs BLE.  No mjr edema.  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 female  1 Day Post-Op  Procedure(s):  POST-OPERATIVE DIAGNOSIS: recurrent bowel obstructions with possible ileal thickening from prior radiation enteritis   PROCEDURE: Procedure(s):  LAPAROSCOPIC LYSIS OF ADHESIONS x2 hours  OPEN LYSIS OF ADHESIONS X 2 hours  SMALL BOWEL RESECTION (ileum)  SEROSAL REPAIRS X4  ON-Q PLACEMENT  SURGEON: Surgeon(s):  Adin Hector, MD  Shann Medal, MD  Georganna Skeans, MD   Stable  Plan:  -clears - adv slowly.  Anti-ileus protocol. -change narcotics -follow BP -low UOP and inc Cr- prob dehydrated - IVF bolus & follow.  Keep foley for now -VTE prophylaxis- SCDs, etc -mobilize as tolerated to help recovery - had issues w BLE neuropathy/standing - PT/OT evals  Adin Hector, M.D., F.A.C.S. Gastrointestinal and Minimally Invasive Surgery [facility] [address], [place], [place] [address] [phone] Main / Paging  12/29/2013   Results:   Labs: Results for orders placed during the hospital encounter of 12/28/13 (from the past 48 hour(s))  PROTIME-INR     Status: Abnormal   Collection Time    12/28/13  6:50 AM      Result Value Ref Range   Prothrombin Time 15.9 (*) 11.6 - 15.2 seconds   INR 1.30  0.00 - 1.49  CBC     Status:  Abnormal   Collection Time    12/28/13  5:06 PM      Result Value Ref Range   WBC 16.7 (*) 4.0 - 10.5 K/uL   RBC 4.19  3.87 - 5.11 MIL/uL   Hemoglobin 12.4  12.0 - 15.0 g/dL   HCT 37.5  36.0 - 46.0 %   MCV 89.5  78.0 - 100.0 fL   MCH 29.6  26.0 - 34.0 pg   MCHC 33.1  30.0 - 36.0 g/dL   RDW 15.1  11.5 - 15.5 %   Platelets 225  150 - 400 K/uL  CREATININE, SERUM     Status: Abnormal   Collection Time    12/28/13  5:06 PM      Result Value Ref Range   Creatinine, Ser 1.04  0.50 - 1.10 mg/dL   GFR calc non Af Amer 51 (*) >90 mL/min   GFR calc Af Amer 59 (*) >90 mL/min   Comment: (NOTE)     The eGFR has been calculated using the CKD EPI equation.     This calculation has not been validated in all clinical situations.     eGFR's persistently <90 mL/min signify possible Chronic Kidney     Disease.  BASIC METABOLIC PANEL     Status: Abnormal   Collection Time    12/29/13  3:25 AM      Result Value Ref Range   Sodium 134 (*) 137 - 147 mEq/L   Potassium 5.2  3.7 - 5.3 mEq/L   Chloride 101  96 - 112 mEq/L   CO2 23  19 - 32 mEq/L   Glucose, Bld 155 (*) 70 - 99 mg/dL   BUN 21  6 - 23 mg/dL   Creatinine, Ser 1.30 (*) 0.50 - 1.10 mg/dL   Calcium 8.0 (*) 8.4 - 10.5 mg/dL   GFR calc non Af Amer 39 (*) >90 mL/min   GFR calc Af Amer 45 (*) >90 mL/min   Comment: (NOTE)     The eGFR has been calculated using the CKD EPI equation.     This calculation has not been validated in all clinical situations.     eGFR's persistently <90 mL/min signify possible Chronic Kidney     Disease.  CBC     Status: Abnormal   Collection Time    12/29/13  3:25 AM      Result Value Ref Range   WBC 11.9 (*) 4.0 - 10.5 K/uL   RBC 3.48 (*) 3.87 - 5.11 MIL/uL   Hemoglobin 10.4 (*) 12.0 - 15.0 g/dL   HCT 31.2 (*) 36.0 - 46.0 %   MCV 89.7  78.0 - 100.0 fL   MCH 29.9  26.0 - 34.0 pg   MCHC 33.3  30.0 - 36.0 g/dL   RDW 15.4  11.5 - 15.5 %   Platelets 196  150 - 400 K/uL  MAGNESIUM     Status: None   Collection  Time    12/29/13  3:25 AM      Result Value Ref Range  Magnesium 1.6  1.5 - 2.5 mg/dL    Imaging / Studies: No results found.  Medications / Allergies: per chart  Antibiotics: Anti-infectives   Start     Dose/Rate Route Frequency Ordered Stop   12/28/13 2000  clindamycin (CLEOCIN) IVPB 900 mg     900 mg 100 mL/hr over 30 Minutes Intravenous 3 times per day 12/28/13 1634 12/28/13 2106   12/28/13 0745  clindamycin (CLEOCIN) IVPB 900 mg     900 mg 100 mL/hr over 30 Minutes Intravenous On call 12/28/13 0732 12/28/13 0751   12/28/13 0730  clindamycin (CLEOCIN) IVPB 600 mg  Status:  Discontinued    Comments:  Pharmacy may adjust dosing strength, interval, or rate of medication as needed for optimal therapy for the patient Send with patient on call to the OR.  Anesthesia to complete antibiotic administration <53mn [facility]   600 mg 100 mL/hr over 30 Minutes Intravenous On call to O.R. 12/28/13 0689504/23/15 0731   12/28/13 0730  gentamicin (GARAMYCIN) IVPB 100 mg  Status:  Discontinued    Comments:  Pharmacy may adjust dosing strength, schedule, rate of infusion, etc as needed to optimize therapy Send with patient on call to the OR.  Anesthesia to complete antibiotic administration <685m [facility]  100 mg 200 mL/hr over 30 Minutes Intravenous On call to O.R. 12/28/13 0717 12/28/13 0725   12/28/13 0730  gentamicin (GARAMYCIN) 460 mg in dextrose 5 % 100 mL IVPB     460 mg 223 mL/hr over 30 Minutes Intravenous On call 12/28/13 0727 12/28/13 0802       Note: This dictation was prepared with Dragon/digital dictation along with Smartphrase technology. Any transcriptional errors that result from this process are unintentional.

## 2013-12-30 LAB — GLUCOSE, CAPILLARY: Glucose-Capillary: 116 mg/dL — ABNORMAL HIGH (ref 70–99)

## 2013-12-30 NOTE — Progress Notes (Signed)
Clinical Social Work Department BRIEF PSYCHOSOCIAL ASSESSMENT 12/30/2013  Patient:  Yolanda Spears, Yolanda Spears     Account Number:  [id number]     Admit date:  12/28/2013  Clinical Social Worker:  Levie Heritage  Date/Time:  12/30/2013 03:06 PM  Referred by:  Physician  Date Referred:  12/30/2013 Referred for  SNF Placement   Other Referral:   Interview type:  Patient Other interview type:   Pt's brother and sister-in-law at bedside    PSYCHOSOCIAL DATA Living Status:  ALONE Admitted from facility:   Level of care:   Primary support name:  Garfield Cornea Primary support relationship to patient:  SIBLING Degree of support available:   strong    CURRENT CONCERNS Current Concerns  Post-Acute Placement   Other Concerns:    SOCIAL WORK ASSESSMENT / PLAN Met with Pt and family at bedside to discuss d/c plan.    Pt stated that she lives alone and that she feels that SNF may be the best option for her.  She stated that she has good familial support but that she doesn't think that HHPT is enough, at this time.  Pt stated that she received outpt PT several years ago and that she didn't see any benefit.    Pt's family were a source of support to Pt and encouraged her to follow MD and PT's recommendation.    CSW provided Pt with a SNF list and discussed SNF process. Pt gave CSW permissiont to begin the SNF search.    CSW thanked Pt and her family for their time.   Assessment/plan status:  Psychosocial Support/Ongoing Assessment of Needs Other assessment/ plan:   Information/referral to community resources:   SNF list    PATIENT'S/FAMILY'S RESPONSE TO PLAN OF CARE: Pt and family were calm, cooperative and pleasant.    Pt stated that, although she's rather go home, she sees the benefit and value to SNF and stated that she's happy to seriously consider SNF as her d/c plan.    Pt and family thanked CSW for time and assistance.   Bernita Raisin, [place]  Work [phone]

## 2013-12-30 NOTE — Evaluation (Signed)
Physical Therapy Evaluation Patient Details Name: Yolanda Spears MRN: [id number] DOB: 04/13/1935 Today's Date: 12/30/2013   History of Present Illness  Pt is a 79 year old female admitted due to recurrent bowel obstructions with possible ileal thickening from prior radiation enteritis and s/p SBO resection.  Pt with PMHx of stroke, recurrent SBO, breast cancer, endometrial cancer s/p radiation, arthritis, pacemaker and peripheral neuropathy  Clinical Impression  Pt currently with functional limitations due to the deficits listed below (see PT Problem List).  Pt will benefit from skilled PT to increase their independence and safety with mobility to allow discharge to the venue listed below.  Pt ambulated in hallway with RW and reports pain tolerable.  Pt reports her daughter is coming on Sunday and can assist for a week.  Recommend SNF at this time however if d/c home, will need RW.     Follow Up Recommendations SNF    Equipment Recommendations  Rolling walker with 5" wheels    Recommendations for Other Services       Precautions / Restrictions Precautions Precautions: Fall Precaution Comments: OnQ pump      Mobility  Bed Mobility Overal bed mobility: Needs Assistance Bed Mobility: Supine to Sit;Sit to Supine     Supine to sit: Min assist Sit to supine: Min assist   General bed mobility comments: pt performed LEs over EOB then assist for trunk upright with 2 HHA (requested visitor to assist giving her a hand to pull herself upright)  Transfers Overall transfer level: Needs assistance Equipment used: Rolling walker (2 wheeled) Transfers: Sit to/from Stand Sit to Stand: Min assist         General transfer comment: verbal cues for hand placement, assist to rise  Ambulation/Gait Ambulation/Gait assistance: Min assist Ambulation Distance (Feet): 120 Feet Assistive device: Rolling walker (2 wheeled) Gait Pattern/deviations: Step-through pattern;Trunk flexed     General  Gait Details: verbal cues for safe use of RW as pt reports not having experience using RW before, max cues for RW distance and posture  Stairs            Wheelchair Mobility    Modified Rankin (Stroke Patients Only)       Balance                                             Pertinent Vitals/Pain Activity to tolerance, OnQ, RN with meds end of session    Home Living Family/patient expects to be discharged to:: Private residence Living Arrangements: Alone Available Help at Discharge: Family (daughter available 24/7 x 1 week (coming Sun)) Type of Home: House Home Access: Stairs to enter   [employer] of Steps: 1 Home Layout: One level Home Equipment: Cane - single point;Grab bars - toilet;Grab bars - tub/shower;Shower seat      Prior Function Level of Independence: Independent with assistive device(s)         Comments: uses cane outside; furniture walks inside     Hand Dominance        Extremity/Trunk Assessment               Lower Extremity Assessment: Generalized weakness;RLE deficits/detail;LLE deficits/detail   LLE Deficits / Details: pt states hx of L foot dragging occasionally however able to perform PF/DF without difficulty     Communication   Communication: No difficulties  Cognition Arousal/Alertness: Awake/alert Behavior During  Therapy: WFL for tasks assessed/performed Overall Cognitive Status: Within Functional Limits for tasks assessed                      General Comments      Exercises        Assessment/Plan    PT Assessment Patient needs continued PT services  PT Diagnosis Difficulty walking   PT Problem List Decreased strength;Decreased mobility;Decreased activity tolerance;Decreased knowledge of use of DME  PT Treatment Interventions DME instruction;Gait training;Functional mobility training;Therapeutic activities;Therapeutic exercise;Patient/family education   PT Goals (Current  goals can be found in the Care Plan section) Acute Rehab PT Goals PT Goal Formulation: With patient Time For Goal Achievement: 01/06/14 Potential to Achieve Goals: Good    Frequency Min 3X/week   Barriers to discharge        Co-evaluation               End of Session   Activity Tolerance: Patient tolerated treatment well Patient left: in bed;with call bell/phone within reach;with family/visitor present;with nursing/sitter in room           Time: 0952-1010 PT Time Calculation (min): 18 min   Charges:   PT Evaluation $Initial PT Evaluation Tier I: 1 Procedure PT Treatments $Gait Training: 8-22 mins   PT G CodesJunius Argyle 12/30/2013, 12:19 PM Carmelia Bake, PT, DPT 12/30/2013 Pager: [phone]

## 2013-12-30 NOTE — Progress Notes (Signed)
[place], MD, [address] [place], [place] [address] Phone: [phone] FAX: [phone]    Yolanda Spears [id number] 04/05/1935  CARE TEAM:  PCP: Thressa Sheller, MD  Outpatient Care Team: Patient Care Team: Thressa Sheller, MD as PCP - General [name], MD as Consulting Physician (Gynecologic Oncology) Darlin Coco, MD as Consulting Physician (Cardiology) Jerene Bears, MD as Consulting Physician (Gastroenterology) Adin Hector, MD as Consulting Physician (General Surgery)  Inpatient Treatment Team: Treatment Team: Attending Provider: Adin Hector, MD; Technician: Naomie Dean, NT; Registered Nurse: Clenton Pare, RN; Registered Nurse: Atilano Median, RN; Student Nurse: Carver Fila, Student-RN; Registered Nurse: Patton Salles, RN; Registered Nurse: Morrison Old, RN; Physical Therapist: Junius Argyle, PT  Subjective:  More alert Tolerating clears  Working w PT/OT Wants bedside commode Dry mouth - using lip balm  Objective:  Vital signs:  Filed Vitals:   12/29/13 1451 12/29/13 1817 12/29/13 2130 12/30/13 0544  BP: 117/35  119/87 143/44  Pulse: 73  78 77  Temp: 98.1 F (36.7 C)  98.4 F (36.9 C) 98.4 F (36.9 C)  TempSrc: Oral  Oral Oral  Resp: 18  18 18   Height:      Weight:    220 lb 3.8 oz (99.9 kg)  SpO2: 96% 96% 95% 96%    Last BM Date: 12/28/13  Intake/Output   Yesterday:  04/24 0701 - 04/25 0700 In: 1755 [P.O.:660; I.V.:1095] Out: 1200 [Urine:1200] This shift:  Total I/O In: 120 [P.O.:120] Out: -   Bowel function:  Flatus: n  BM: n  Drain: n/a  Physical Exam:  General: Pt awake/alert/oriented x4 in no acute distress Eyes: PERRL, normal EOM.  Sclera clear.  No icterus Neuro: CN II-XII intact w/o focal sensory/motor deficits. Lymph: No head/neck/groin lymphadenopathy Psych:  No  delerium/psychosis/paranoia.  Talkative/inquisitve.  Pleasant HENT: Normocephalic, Mucus membranes moist.  No thrush Neck: Supple, No tracheal deviation Chest: No chest wall pain w good excursion CV:  Pulses intact.  Regular rhythm MS: Normal AROM mjr joints.  No obvious deformity Abdomen: Soft.  Nondistended.  Likes wearing an abbd binder.  Min tender at incision.  No blood. No evidence of peritonitis.  No incarcerated hernias. Ext:  SCDs BLE.  No mjr edema.  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 female  2 Days Post-Op  Procedure(s):  POST-OPERATIVE DIAGNOSIS: recurrent bowel obstructions with possible ileal thickening from prior radiation enteritis   PROCEDURE: Procedure(s):  LAPAROSCOPIC LYSIS OF ADHESIONS x2 hours  OPEN LYSIS OF ADHESIONS X 2 hours  SMALL BOWEL RESECTION (ileum)  SEROSAL REPAIRS X4  ON-Q PLACEMENT  SURGEON: Surgeon(s):  Adin Hector, MD  Shann Medal, MD  Georganna Skeans, MD   Stable  Plan:  -clears - adv to fulls slowly.  Anti-ileus protocol. -change narcotics -follow BP -low UOP and inc Cr- prob dehydrated - IVF bolus & follow.  Keep foley for now -VTE prophylaxis- SCDs, etc -mobilize as tolerated to help recovery - had issues w BLE neuropathy/standing - PT/OT evals -d/c tele - move to surg floor -path on SB benign -  d/w pt  Adin Hector, M.D., F.A.C.S. Gastrointestinal and Minimally Invasive Surgery [facility] [address], [place], [place] [address] [phone] Main / Paging   12/30/2013   Results:   Labs: Results for orders placed during the hospital encounter of 12/28/13 (from the  past 48 hour(s))  CBC     Status: Abnormal   Collection Time    12/28/13  5:06 PM      Result Value Ref Range   WBC 16.7 (*) 4.0 - 10.5 K/uL   RBC 4.19  3.87 - 5.11 MIL/uL   Hemoglobin 12.4  12.0 - 15.0 g/dL   HCT 37.5  36.0 - 46.0 %   MCV 89.5  78.0 - 100.0 fL   MCH 29.6  26.0 - 34.0 pg   MCHC 33.1  30.0 - 36.0 g/dL   RDW 15.1  11.5 - 15.5 %   Platelets 225  150 - 400 K/uL  CREATININE, SERUM     Status: Abnormal   Collection Time    12/28/13  5:06 PM      Result Value Ref Range   Creatinine, Ser 1.04  0.50 - 1.10 mg/dL   GFR calc non Af Amer 51 (*) >90 mL/min   GFR calc Af Amer 59 (*) >90 mL/min   Comment: (NOTE)     The eGFR has been calculated using the CKD EPI equation.     This calculation has not been validated in all clinical situations.     eGFR's persistently <90 mL/min signify possible Chronic Kidney     Disease.  BASIC METABOLIC PANEL     Status: Abnormal   Collection Time    12/29/13  3:25 AM      Result Value Ref Range   Sodium 134 (*) 137 - 147 mEq/L   Potassium 5.2  3.7 - 5.3 mEq/L   Chloride 101  96 - 112 mEq/L   CO2 23  19 - 32 mEq/L   Glucose, Bld 155 (*) 70 - 99 mg/dL   BUN 21  6 - 23 mg/dL   Creatinine, Ser 1.30 (*) 0.50 - 1.10 mg/dL   Calcium 8.0 (*) 8.4 - 10.5 mg/dL   GFR calc non Af Amer 39 (*) >90 mL/min   GFR calc Af Amer 45 (*) >90 mL/min   Comment: (NOTE)     The eGFR has been calculated using the CKD EPI equation.     This calculation has not been validated in all clinical situations.     eGFR's persistently <90 mL/min signify possible Chronic Kidney     Disease.  CBC     Status: Abnormal   Collection Time    12/29/13  3:25 AM      Result Value Ref Range   WBC 11.9 (*) 4.0 - 10.5 K/uL   RBC 3.48 (*) 3.87 - 5.11 MIL/uL   Hemoglobin 10.4 (*) 12.0 - 15.0 g/dL   HCT 31.2 (*) 36.0 - 46.0 %   MCV 89.7  78.0 - 100.0 fL   MCH 29.9  26.0 - 34.0 pg   MCHC 33.3  30.0 - 36.0 g/dL   RDW 15.4  11.5 - 15.5 %   Platelets 196  150 - 400 K/uL   MAGNESIUM     Status: None   Collection Time    12/29/13  3:25 AM      Result Value Ref Range   Magnesium 1.6  1.5 - 2.5 mg/dL    Imaging /  Studies: No results found.  Medications / Allergies: per chart  Antibiotics: Anti-infectives   Start     Dose/Rate Route Frequency Ordered Stop   12/28/13 2000  clindamycin (CLEOCIN) IVPB 900 mg     900 mg 100 mL/hr over 30 Minutes Intravenous 3 times per day 12/28/13 1634 12/28/13 2106   12/28/13 0745  clindamycin (CLEOCIN) IVPB 900 mg     900 mg 100 mL/hr over 30 Minutes Intravenous On call 12/28/13 0732 12/28/13 0751   12/28/13 0730  clindamycin (CLEOCIN) IVPB 600 mg  Status:  Discontinued    Comments:  Pharmacy may adjust dosing strength, interval, or rate of medication as needed for optimal therapy for the patient Send with patient on call to the OR.  Anesthesia to complete antibiotic administration [facility]90mn [facility]   600 mg 100 mL/hr over 30 Minutes Intravenous On call to O.R. 12/28/13 0503504/23/15 0731   12/28/13 0730  gentamicin (GARAMYCIN) IVPB 100 mg  Status:  Discontinued    Comments:  Pharmacy may adjust dosing strength, schedule, rate of infusion, etc as needed to optimize therapy Send with patient on call to the OR.  Anesthesia to complete antibiotic administration <671m [facility]  100 mg 200 mL/hr over 30 Minutes Intravenous On call to O.R. 12/28/13 0717 12/28/13 0725   12/28/13 0730  gentamicin (GARAMYCIN) 460 mg in dextrose 5 % 100 mL IVPB     460 mg 223 mL/hr over 30 Minutes Intravenous On call 12/28/13 0727 12/28/13 0802       Note: This dictation was prepared with Dragon/digital dictation along with Smartphrase technology. Any transcriptional errors that result from this process are unintentional.

## 2013-12-30 NOTE — Progress Notes (Signed)
Clinical Social Work Department CLINICAL SOCIAL WORK PLACEMENT NOTE 12/30/2013  Patient:  ALANEE, TING  Account Number:  [id number] Admit date:  12/28/2013  Clinical Social Worker:  Levie Heritage  Date/time:  12/30/2013 03:05 PM  Clinical Social Work is seeking post-discharge placement for this patient at the following level of care:   SKILLED NURSING   (*CSW will update this form in Epic as items are completed)   12/30/2013  Patient/family provided with [facility] Department of Clinical Social Work's list of facilities offering this level of care within the geographic area requested by the patient (or if unable, by the patient's family).  12/30/2013  Patient/family informed of their freedom to choose among providers that offer the needed level of care, that participate in Medicare, Medicaid or managed care program needed by the patient, have an available bed and are willing to accept the patient.  12/30/2013  Patient/family informed of [facility]' ownership interest in [facility], as well as of the fact that they are under no obligation to receive care at this facility.  PASARR submitted to EDS on 12/30/2013 PASARR number received from EDS on 12/30/2013  FL2 transmitted to all facilities in geographic area requested by pt/family on  12/30/2013 FL2 transmitted to all facilities within larger geographic area on   Patient informed that his/her managed care company has contracts with or will negotiate with  certain facilities, including the following:     Patient/family informed of bed offers received:   Patient chooses bed at  Physician recommends and patient chooses bed at    Patient to be transferred to  on   Patient to be transferred to facility by   The following physician request were entered in Epic:   Additional Comments:  Bernita Raisin, [place] Work [phone]

## 2013-12-31 LAB — CBC
HEMATOCRIT: 27.3 % — AB (ref 36.0–46.0)
HEMOGLOBIN: 8.6 g/dL — AB (ref 12.0–15.0)
MCH: 29.5 pg (ref 26.0–34.0)
MCHC: 31.5 g/dL (ref 30.0–36.0)
MCV: 93.5 fL (ref 78.0–100.0)
Platelets: 162 10*3/uL (ref 150–400)
RBC: 2.92 MIL/uL — ABNORMAL LOW (ref 3.87–5.11)
RDW: 16.3 % — ABNORMAL HIGH (ref 11.5–15.5)
WBC: 9.7 10*3/uL (ref 4.0–10.5)

## 2013-12-31 LAB — BASIC METABOLIC PANEL
BUN: 13 mg/dL (ref 6–23)
CHLORIDE: 105 meq/L (ref 96–112)
CO2: 24 mEq/L (ref 19–32)
CREATININE: 1.02 mg/dL (ref 0.50–1.10)
Calcium: 8.3 mg/dL — ABNORMAL LOW (ref 8.4–10.5)
GFR calc Af Amer: 60 mL/min — ABNORMAL LOW (ref >90)
GFR calc non Af Amer: 52 mL/min — ABNORMAL LOW (ref >90)
GLUCOSE: 95 mg/dL (ref 70–99)
POTASSIUM: 4.8 meq/L (ref 3.7–5.3)
Sodium: 137 mEq/L (ref 137–147)

## 2013-12-31 NOTE — Progress Notes (Signed)
Patient ID: Yolanda Spears, female   DOB: Oct 08, 1934, 79 y.o.   MRN: [id number] 3 Days Post-Op  Subjective: Up in chair. For some incisional pain with moving about. Generally feeling a little better and stronger. Has flatus but no bowel movements. Tolerating some full liquids.  Objective: Vital signs in last 24 hours: Temp:  [98.2 F (36.8 C)-98.4 F (36.9 C)] 98.2 F (36.8 C) (04/26 0453) Pulse Rate:  [78-82] 80 (04/26 0453) Resp:  [18-20] 20 (04/26 0453) BP: (157-161)/(47-68) 161/62 mmHg (04/26 0453) SpO2:  [91 %-94 %] 91 % (04/26 0453) Weight:  [215 lb 13.3 oz (97.9 kg)] 215 lb 13.3 oz (97.9 kg) (04/26 0300) Last BM Date: 12/28/13  Intake/Output from previous day: 04/25 0701 - 04/26 0700 In: 1920 [P.O.:720; I.V.:1200] Out: 1600 [Urine:1600] Intake/Output this shift: Total I/O In: -  Out: 375 [Urine:375]  General appearance: alert, cooperative and no distress GI: normal findings: soft, non-tender Incision/Wound: clean and dry  Lab Results:   Recent Labs  12/29/13 0325 12/31/13 0402  WBC 11.9* 9.7  HGB 10.4* 8.6*  HCT 31.2* 27.3*  PLT 196 162   BMET  Recent Labs  12/29/13 0325 12/31/13 0402  NA 134* 137  K 5.2 4.8  CL 101 105  CO2 23 24  GLUCOSE 155* 95  BUN 21 13  CREATININE 1.30* 1.02  CALCIUM 8.0* 8.3*     Studies/Results: No results found.  Anti-infectives: Anti-infectives   Start     Dose/Rate Route Frequency Ordered Stop   12/28/13 2000  clindamycin (CLEOCIN) IVPB 900 mg     900 mg 100 mL/hr over 30 Minutes Intravenous 3 times per day 12/28/13 1634 12/28/13 2106   12/28/13 0745  clindamycin (CLEOCIN) IVPB 900 mg     900 mg 100 mL/hr over 30 Minutes Intravenous On call 12/28/13 0732 12/28/13 0751   12/28/13 0730  clindamycin (CLEOCIN) IVPB 600 mg  Status:  Discontinued    Comments:  Pharmacy may adjust dosing strength, interval, or rate of medication as needed for optimal therapy for the patient Send with patient on call to the OR.   Anesthesia to complete antibiotic administration <58min prior to incision per [facility].   600 mg 100 mL/hr over 30 Minutes Intravenous On call to O.R. 12/28/13 3500 12/28/13 0731   12/28/13 0730  gentamicin (GARAMYCIN) IVPB 100 mg  Status:  Discontinued    Comments:  Pharmacy may adjust dosing strength, schedule, rate of infusion, etc as needed to optimize therapy Send with patient on call to the OR.  Anesthesia to complete antibiotic administration <36min prior to incision per [facility].   100 mg 200 mL/hr over 30 Minutes Intravenous On call to O.R. 12/28/13 0717 12/28/13 0725   12/28/13 0730  gentamicin (GARAMYCIN) 460 mg in dextrose 5 % 100 mL IVPB     460 mg 223 mL/hr over 30 Minutes Intravenous On call 12/28/13 0727 12/28/13 0802      Assessment/Plan: s/p Procedure(s): LAPAROSCOPIC LYSIS OF ADHESIONS converted OPEN SMALL BOWEL Overall improvement. Renal function normalized. White blood count normal. DC Foley. Continue full liquid diet. Continue PT OT   LOS: 3 days    Edward Jolly 12/31/2013

## 2013-12-31 NOTE — Progress Notes (Signed)
2115 removed on q pump and dressing. Replaced with ABD and a 2x2 wet to dry at bottom of incision due to still a little open. Place a 2x2 with paper tape over incision port #3. The rest of the incision looked remarkable. Wash patient up and replaced ABD binder. Patton Salles :) RN

## 2014-01-01 DIAGNOSIS — I10 Essential (primary) hypertension: Secondary | ICD-10-CM

## 2014-01-01 DIAGNOSIS — D649 Anemia, unspecified: Secondary | ICD-10-CM

## 2014-01-01 LAB — PROTIME-INR
INR: 1.34 (ref 0.00–1.49)
Prothrombin Time: 16.3 seconds — ABNORMAL HIGH (ref 11.6–15.2)

## 2014-01-01 MED ORDER — METOPROLOL TARTRATE 25 MG PO TABS
25.0000 mg | ORAL_TABLET | Freq: Two times a day (BID) | ORAL | Status: DC
  Administered 2014-01-01 – 2014-01-04 (×7): 25 mg via ORAL
  Filled 2014-01-01 (×8): qty 1

## 2014-01-01 MED ORDER — BOOST PLUS PO LIQD
237.0000 mL | Freq: Three times a day (TID) | ORAL | Status: DC
  Administered 2014-01-01 – 2014-01-03 (×7): 237 mL via ORAL
  Filled 2014-01-01 (×11): qty 237

## 2014-01-01 MED ORDER — BISMUTH SUBSALICYLATE 262 MG/15ML PO SUSP
30.0000 mL | Freq: Three times a day (TID) | ORAL | Status: DC | PRN
  Filled 2014-01-01: qty 236

## 2014-01-01 MED ORDER — WARFARIN 1.25 MG HALF TABLET: 1.2500 mg | ORAL_TABLET | Freq: Every evening | ORAL | Status: DC

## 2014-01-01 MED ORDER — PSYLLIUM 95 % PO PACK
1.0000 | PACK | Freq: Two times a day (BID) | ORAL | Status: DC
  Administered 2014-01-01 – 2014-01-04 (×6): 1 via ORAL
  Filled 2014-01-01 (×8): qty 1

## 2014-01-01 MED ORDER — LISINOPRIL 10 MG PO TABS
10.0000 mg | ORAL_TABLET | Freq: Two times a day (BID) | ORAL | Status: DC
  Administered 2014-01-01 – 2014-01-04 (×7): 10 mg via ORAL
  Filled 2014-01-01 (×8): qty 1

## 2014-01-01 MED ORDER — WARFARIN - PHARMACIST DOSING INPATIENT: Freq: Every day | Status: DC

## 2014-01-01 MED ORDER — EZETIMIBE 10 MG PO TABS
5.0000 mg | ORAL_TABLET | Freq: Every evening | ORAL | Status: DC
  Administered 2014-01-01 – 2014-01-03 (×3): 5 mg via ORAL
  Filled 2014-01-01 (×4): qty 0.5

## 2014-01-01 MED ORDER — WARFARIN SODIUM 4 MG PO TABS
4.0000 mg | ORAL_TABLET | Freq: Once | ORAL | Status: AC
  Administered 2014-01-01: 4 mg via ORAL
  Filled 2014-01-01: qty 1

## 2014-01-01 MED ORDER — OXYCODONE HCL 5 MG PO TABS: 5.0000 mg | ORAL_TABLET | ORAL | Status: DC | PRN

## 2014-01-01 MED ORDER — LACTATED RINGERS IV BOLUS (SEPSIS): 1000.0000 mL | Freq: Three times a day (TID) | INTRAVENOUS | Status: AC | PRN

## 2014-01-01 NOTE — Progress Notes (Signed)
Occupational Therapy Treatment Patient Details Name: Yolanda Spears MRN: [id number] DOB: 03/13/1935 Today's Date: 01/01/2014    History of present illness Pt is a 79 year old female admitted due to recurrent bowel obstructions with possible ileal thickening from prior radiation enteritis and s/p SBO resection.  Pt with PMHx of stroke, recurrent SBO, breast cancer, endometrial cancer s/p radiation, arthritis, pacemaker and peripheral neuropathy   OT comments  Pt motivated to learn more about AE this session and worked on bathing/toileting tasks. She will benefit from continued OT at SNF to progress ADL independence.    Follow Up Recommendations  SNF    Equipment Recommendations   (possibly 3in1 versus higher toilet installed by family)    Recommendations for Other Services      Precautions / Restrictions Precautions Precautions: Fall Precaution Comments: abdominal binder Restrictions Weight Bearing Restrictions: No       Mobility Bed Mobility                  Transfers Overall transfer level: Needs assistance Equipment used: None Transfers: Sit to/from Stand Sit to Stand: Min assist         General transfer comment: verbal cues hand placement assist to rise and steady, control descent to chair    Balance                                   ADL               Lower Body Bathing: Maximal assistance;Sit to/from stand (without AE; min assist sit to stand)           Toilet Transfer: Minimal assistance;BSC (stand from [facility] and several backwards steps to recliner)   Toileting- Clothing Manipulation and Hygiene: Total assistance;Sit to/from stand (for posterior periarea as pt unable to reach and clean after BM)         General ADL Comments: Discussed and educated on AE options for LB self care and coverage. Pt interestedin obtaining AE kit. Pt having trouble with reaching posterior periarea and would benefit from LHS to wash as well as toilet  aid. Pt did well with sit to stand from [facility] with min assist.       Vision                     Perception     Praxis      Cognition   Behavior During Therapy: [facility] for tasks assessed/performed Overall Cognitive Status: Within Functional Limits for tasks assessed                       Extremity/Trunk Assessment               Exercises     Shoulder Instructions       General Comments      Pertinent Vitals/ Pain       Pt reports 4.5/10. Informed nursing  Home Living                                          Prior Functioning/Environment              Frequency Min 2X/week     Progress Toward Goals  OT Goals(current goals can now be found in the care plan section)  Progress towards OT goals: Progressing toward goals     Plan Discharge plan remains appropriate    Co-evaluation                 End of Session     Activity Tolerance Patient tolerated treatment well   Patient Left in chair;with call bell/phone within reach   Nurse Communication          Time: 3300-7622 OT Time Calculation (min): 27 min  Charges: OT General Charges $OT Visit: 1 Procedure OT Treatments $Self Care/Home Management : 8-22 mins $Therapeutic Activity: 8-22 mins  Jules Schick [phone] 01/01/2014, 9:34 AM

## 2014-01-01 NOTE — Progress Notes (Signed)
ANTICOAGULATION CONSULT NOTE - Initial Consult  Pharmacy Consult for Warfarin Indication: atrial fibrillation  Allergies  Allergen Reactions  . Other     Contrast dye   . Penicillins Itching  . Shellfish-Derived Products Nausea And Vomiting  . Statins     Myalgias,elevated LFT studies  . Iohexol Hives and Itching    Itching in eyes, one hive on face.    Patient Measurements: Height: 5\' 7"  (170.2 cm) Weight: 210 lb 1.6 oz (95.301 kg) IBW/kg (Calculated) : 61.6   Vital Signs: Temp: 98.2 F (36.8 C) (04/27 0551) Temp src: Oral (04/27 0551) BP: 185/60 mmHg (04/27 0551) Pulse Rate: 84 (04/27 0551)  Labs:  Recent Labs  12/31/13 0402  HGB 8.6*  HCT 27.3*  PLT 162  CREATININE 1.02    Estimated Creatinine Clearance: 55.6 ml/min (by C-G formula based on Cr of 1.02).   Medical History: Past Medical History  Diagnosis Date  . Pacemaker -Medtronic   . Hypertension   . Peripheral neuropathy   . High cholesterol   . Heart murmur   . Shortness of breath on exertion   . Atrial fibrillation   . History of radiation therapy   . Partial bowel obstruction JAN/2013    recurrent  . Colon polyp 2011  . Osteopenia 02/2013    T score -1.4 FRAX 11%/2.1%  . Stroke     "small stroke & several TIA's occasional visual problems / and occasional speach problems  . Arthritis   . Personal history of skin cancer   . Urinary leakage   . Hx: UTI (urinary tract infection)   . Sleep apnea     unaable to tolerate mask   . Endometrial cancer 2006    Stage IIIc  s/p radiation  . Breast cancer 1999    LOBULAR CARCINOMA IN SITU...  . Ringing in ears   . Orthostatic lightheadedness 11/27/2011    Medications:  Scheduled:  . acetaminophen  1,000 mg Oral TID  . alvimopan  12 mg Oral BID  . antiseptic oral rinse  15 mL Mouth Rinse BID  . aspirin  81 mg Oral Daily  . calcium carbonate  1 tablet Oral Daily  . ezetimibe  5 mg Oral QPM  . heparin subcutaneous  5,000 Units Subcutaneous 3  times per day  . hydrALAZINE  25 mg Oral BID  . lactose free nutrition  237 mL Oral TID WC  . lip balm  1 application Topical BID  . lisinopril  10 mg Oral BID  . metoprolol tartrate  25 mg Oral BID  . pregabalin  75 mg Oral BID  . propafenone  225 mg Oral 3 times per day  . psyllium  1 packet Oral BID  . saccharomyces boulardii  250 mg Oral BID    Assessment: 79 YO presented with partial small bowel obstruction-recurrent-s/p lysis of adhesions on 4/23. HX of AFib, takes warfarin 1.25mg  on MWF and 2.5mg  the rest of the week, last dose on 4/17. Has been on SQ heparin since 4/23. INR today=1.34 Goal of Therapy:  INR 2-3    Plan:  1. Give warfarin 4mg  tonight @ 1800 2. Daily PT/INR 3. Monitor CBC's   Dolly Rias RPh 01/01/2014, 11:22 AM Pager [phone]

## 2014-01-01 NOTE — Progress Notes (Signed)
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  Pt with PMHx of stroke, recurrent SBO, breast cancer, endometrial cancer s/p radiation, arthritis, pacemaker and peripheral neuropathy    PT Comments    Assisted pt OOB to amb to BR to void then amb in hallway.    Follow Up Recommendations  SNF     Equipment Recommendations       Recommendations for Other Services       Precautions / Restrictions Precautions Precautions: Fall Precaution Comments: abdominal binder Restrictions Weight Bearing Restrictions: No    Mobility  Bed Mobility Overal bed mobility: Needs Assistance       Supine to sit: Min assist     General bed mobility comments: increased time and use of rail  Transfers Overall transfer level: Needs assistance Equipment used: None Transfers: Sit to/from Stand Sit to Stand: Min assist         General transfer comment: verbal cues hand placement assist to rise and steady, control descent to toilet  Ambulation/Gait Ambulation/Gait assistance: Min assist;Min guard Ambulation Distance (Feet): 220 Feet Assistive device: Rolling walker (2 wheeled) Gait Pattern/deviations: Step-through pattern;Trunk flexed Gait velocity: decreased   General Gait Details: 25% vc'S ON PROPER USE OF WALKER ESP IN BR.  Believe pt will not need a RW in her home.    Stairs            Wheelchair Mobility    Modified Rankin (Stroke Patients Only)       Balance                                    Cognition                            Exercises      General Comments        Pertinent Vitals/Pain     Home Living                      Prior Function            PT  Goals (current goals can now be found in the care plan section) Progress towards PT goals: Progressing toward goals    Frequency  Min 3X/week    PT Plan      Co-evaluation             End of Session Equipment Utilized During Treatment: Gait belt Activity Tolerance: Patient tolerated treatment well Patient left: in bed;with call bell/phone within reach;with family/visitor present;with nursing/sitter in room     Time: 1455-1520 PT Time Calculation (min): 25 min  Charges:  $Gait Training: 8-22 mins $Therapeutic Activity: 8-22 mins                    G Codes:      Rica Koyanagi  PTA [facility]  Acute  Rehab Pager      [phone]

## 2014-01-01 NOTE — Progress Notes (Signed)
CSW provided patient with SNF bed offers - patient to review list with her sister this afternoon.   CSW will check back in the morning re: SNF decision.   *Dr. Johney Maine - please sign FL2 on shadow chart in [facility].    Clinical Social Work Department CLINICAL SOCIAL WORK PLACEMENT NOTE 01/01/2014  Patient:  Yolanda Spears, Yolanda Spears  Account Number:  [id number] Admit date:  12/28/2013  Clinical Social Worker:  Levie Heritage  Date/time:  12/30/2013 03:05 PM  Clinical Social Work is seeking post-discharge placement for this patient at the following level of care:   SKILLED NURSING   (*CSW will update this form in Epic as items are completed)   12/30/2013  Patient/family provided with [place] Department of Clinical Social Work's list of facilities offering this level of care within the geographic area requested by the patient (or if unable, by the patient's family).  12/30/2013  Patient/family informed of their freedom to choose among providers that offer the needed level of care, that participate in Medicare, Medicaid or managed care program needed by the patient, have an available bed and are willing to accept the patient.  12/30/2013  Patient/family informed of [facility]' ownership interest in [facility], as well as of the fact that they are under no obligation to receive care at this facility.  PASARR submitted to EDS on 12/30/2013 PASARR number received from [employer] on 12/30/2013  FL2 transmitted to all facilities in geographic area requested by pt/family on  12/30/2013 FL2 transmitted to all facilities within larger geographic area on   Patient informed that his/her managed care company has contracts with or will negotiate with  certain facilities, including the following:     Patient/family informed of bed offers received:  01/01/2014 Patient chooses bed at  Physician recommends and patient chooses bed at    Patient to be transferred to  on   Patient to be  transferred to facility by   The following physician request were entered in Epic:   Additional Comments:   Raynaldo Opitz, [place] Social Worker cell #: [phone]

## 2014-01-01 NOTE — Progress Notes (Signed)
INITIAL NUTRITION ASSESSMENT  DOCUMENTATION CODES Per approved criteria  -Obesity Unspecified -Not Applicable   INTERVENTION: -Provided "Fiber Restricted Nutrition Therapy" handout; consider modifying to Soft/Low fiber diet -Reviewed s/p SBO resection nutrition recommendations -Encouraged gradual introduction of fiber foods -Continue with Boost TID  NUTRITION DIAGNOSIS: Food and nutrition knowledge deficit related to low fiber/post op diet as evidenced by diet recall and s/p SBO resection.   Goal: Pt to meet >/= 90% of their estimated nutrition needs, and recommended diet adherence  Monitor:  Total protein/energy intake, diet order, GI profile, labs, weight, diet education needs  Reason for Assessment: Consult  79 y.o. female  Admitting Dx: Thickened small bowel  ASSESSMENT: Recurrent episodes of bloating pain and diarrhea suspicious for partial small bowel obstruction that is chronic. Probably related to prior radiation with thickened/strictured intestine & Adhesions from prior operations   -Pt denied recent unintentional wt loss -W/nausea/vomiting and abd pain pta. Had been trying to comply with low fiber diet; however per diet recall, pt had been consuming large amount of high fiber foods (beans, popcorn, oatmeal) -Pt s/p lysis of adhesions and SBO resection. Continues with loose stools. Tolerating approx 50% PO intake. -Does not consume much protein sources-encourage intake of yogurt, eggs and Boost supplements  -Educated pt on importance of low fiber diet-white foods with softened/well cooked fruits and vegetables. Pt may benefit from complying with diet for 2-3 weeks with gradual incorporation of fiber foods once loose stools improve -Provided pt with educational handout -Encouraged pt to comply with diet upon d/c at rehab facility  Height: Ht Readings from Last 1 Encounters:  12/28/13 5\' 7"  (1.702 m)    Weight: Wt Readings from Last 1 Encounters:  01/01/14 210 lb  1.6 oz (95.301 kg)    Ideal Body Weight: 135 lbs  % Ideal Body Weight: 156%  Wt Readings from Last 10 Encounters:  01/01/14 210 lb 1.6 oz (95.301 kg)  01/01/14 210 lb 1.6 oz (95.301 kg)  12/22/13 203 lb 6 oz (92.25 kg)  11/29/13 199 lb (90.266 kg)  10/20/13 206 lb 9.6 oz (93.713 kg)  09/20/13 205 lb (92.987 kg)  09/11/13 201 lb 8 oz (91.4 kg)  07/31/13 206 lb (93.441 kg)  05/18/13 221 lb (100.245 kg)  03/29/13 205 lb (92.987 kg)    Usual Body Weight: 200-210 lbs per previous med records  % Usual Body Weight: 100%  BMI:  Body mass index is 32.9 kg/(m^2).Obesity I  Estimated Nutritional Needs: Kcal: 1900-2100 Protein: 105-115 gram Fluid: >/=2100 ml/daily  Skin: surgical wounds on abd  Diet Order: Cardiac  EDUCATION NEEDS: -Education needs addressed   Intake/Output Summary (Last 24 hours) at 01/01/14 1126 Last data filed at 01/01/14 0910  Gross per 24 hour  Intake   1550 ml  Output    325 ml  Net   1225 ml    Last BM: 4/27   Labs:   Recent Labs Lab 12/28/13 1706 12/29/13 0325 12/31/13 0402  NA  --  134* 137  K  --  5.2 4.8  CL  --  101 105  CO2  --  23 24  BUN  --  21 13  CREATININE 1.04 1.30* 1.02  CALCIUM  --  8.0* 8.3*  MG  --  1.6  --   GLUCOSE  --  155* 95    CBG (last 3)   Recent Labs  12/30/13 2155  GLUCAP 116*    Scheduled Meds: . acetaminophen  1,000 mg Oral TID  .  alvimopan  12 mg Oral BID  . antiseptic oral rinse  15 mL Mouth Rinse BID  . aspirin  81 mg Oral Daily  . calcium carbonate  1 tablet Oral Daily  . ezetimibe  5 mg Oral QPM  . heparin subcutaneous  5,000 Units Subcutaneous 3 times per day  . hydrALAZINE  25 mg Oral BID  . lactose free nutrition  237 mL Oral TID WC  . lip balm  1 application Topical BID  . lisinopril  10 mg Oral BID  . metoprolol tartrate  25 mg Oral BID  . pregabalin  75 mg Oral BID  . propafenone  225 mg Oral 3 times per day  . psyllium  1 packet Oral BID  . saccharomyces boulardii  250 mg  Oral BID  . warfarin  4 mg Oral ONCE-1800  . Warfarin - Pharmacist Dosing Inpatient   Does not apply q1800    Continuous Infusions:   Past Medical History  Diagnosis Date  . Pacemaker -Medtronic   . Hypertension   . Peripheral neuropathy   . High cholesterol   . Heart murmur   . Shortness of breath on exertion   . Atrial fibrillation   . History of radiation therapy   . Partial bowel obstruction JAN/2013    recurrent  . Colon polyp 2011  . Osteopenia 02/2013    T score -1.4 FRAX 11%/2.1%  . Stroke     "small stroke & several TIA's occasional visual problems / and occasional speach problems  . Arthritis   . Personal history of skin cancer   . Urinary leakage   . Hx: UTI (urinary tract infection)   . Sleep apnea     unaable to tolerate mask   . Endometrial cancer 2006    Stage IIIc  s/p radiation  . Breast cancer 1999    LOBULAR CARCINOMA IN SITU...  . Ringing in ears   . Orthostatic lightheadedness 11/27/2011    Past Surgical History  Procedure Laterality Date  . Lymph node resection  2007  . Cholecystectomy  1990's  . Mastectomy  1999    bilaterally w/lymph node dissection  . Abdominal hysterectomy  2006    BSO  . Oophorectomy      BSO  . Exploratory laparotomy  2007    remove abdominal cyst  . Insert / replace / remove pacemaker  2007    initial placement  2007 / REPLACED 2014     . Laparoscopic lysis intestinal adhesions  12/28/2013  . Abdominal adhesion surgery  12/28/2013  . Small intestine surgery  12/28/2013  . Laparoscopic lysis of adhesions N/A 12/28/2013    Procedure: LAPAROSCOPIC LYSIS OF ADHESIONS converted OPEN SMALL BOWEL;  Surgeon: Adin Hector, MD;  Location: [facility];  Service: General;  Laterality: N/A;    Atlee Abide MS RD LDN Clinical Dietitian [phone]

## 2014-01-01 NOTE — Progress Notes (Signed)
Yolanda Spears [id number] May 25, 1935  CARE TEAM:  PCP: Thressa Sheller, MD  Outpatient Care Team: Patient Care Team: Thressa Sheller, MD as PCP - General [name], MD as Consulting Physician (Gynecologic Oncology) Darlin Coco, MD as Consulting Physician (Cardiology) Jerene Bears, MD as Consulting Physician (Gastroenterology) Adin Hector, MD as Consulting Physician (General Surgery)  Inpatient Treatment Team: Treatment Team: Attending Provider: Adin Hector, MD; Technician: Naomie Dean, NT; Registered Nurse: Clenton Pare, RN; Registered Nurse: Atilano Median, RN; Student Nurse: Carver Fila, Student-RN; Registered Nurse: Patton Salles, RN; Registered Nurse: Morrison Old, RN; Physician Assistant: Clinton Gallant, NT; Physical Therapist: Nathanial Rancher, PTA; Respiratory Therapist: Lissa Merlin, RRT; Occupational Therapist: Jules Schick, OT; Student Nurse: Assunta Curtis, Student-RN  Subjective:  Alert Tolerating solid foods, but eating very small amounts due to lack of appetite Pain minimal, around midline incision and left of umbilicus Working w PT/OT Feet felt a little tingly yesterday evening Walking around hallways and sitting up in chair  Objective:  Vital signs:  Filed Vitals:   12/31/13 1429 12/31/13 2015 01/01/14 0551 01/01/14 0900  BP: 125/66 161/72 185/60 142/81  Pulse: 80 85 84 89  Temp: 98.3 F (36.8 C) 98 F (36.7 C) 98.2 F (36.8 C) 98.1 F (36.7 C)  TempSrc: Oral Oral Oral Oral  Resp: 19 20 20 20   Height:      Weight:   210 lb 1.6 oz (95.301 kg)   SpO2: 96% 96% 96% 96%      Intake/Output  -urinated several times throughout the day of 4/26  Yesterday:  04/26 0701 - 04/27 0700 In: 23 [P.O.:360; I.V.:1200] Out: 700 [Urine:700] This shift:  Total I/O In: 110 [P.O.:60; I.V.:50] Out: -   Bowel function:             Flatus: y             Last BM- 4/27 am  Drain: n/a  -Small amounts of liquid BM several times throughout day, after each time eating or drinking, no blood noticed    Physical Exam:  General: Pt awake/alert/oriented x4 in no acute distress Eyes: PERRL, normal EOM.  Sclera clear.  No icterus Neuro: CN II-XII intact w/o focal sensory/motor deficits. Lymph: No head/neck/groin lymphadenopathy Psych:  No delerium/psychosis/paranoia.  Talkative/inquisitve.  Pleasant HENT: Normocephalic, Mucus membranes moist.  No thrush Neck: Supple, No tracheal deviation Chest: No chest wall pain w good excursion CV:  Pulses intact.  Regular rhythm MS: Normal AROM mjr joints.  No obvious deformity  Abdomen: Soft.  Nondistended. Midline incision starting to open up at inferior portion. Approx 4 cm in length, 2 cm diameter, Likes wearing an abbd binder.  Mildly tender at midline surgical incision site and left of the umbilicus.  No blood. No evidence of peritonitis.  No incarcerated hernias. No drainage. No excessive erythema or warmth Ext:  SCDs BLE.  Noted mild edema in both feet, felt mild tingle sensation yesterday. 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 female  4 Days Post-Op  Procedure(s):  POST-OPERATIVE DIAGNOSIS: recurrent bowel obstructions with possible ileal thickening from prior radiation enteritis   PROCEDURE: Procedure(s):  LAPAROSCOPIC LYSIS OF ADHESIONS x2 hours  OPEN LYSIS OF ADHESIONS X 2 hours  SMALL BOWEL RESECTION (ileum)  SEROSAL REPAIRS X4  ON-Q PLACEMENT  SURGEON: Surgeon(s):  Adin Hector, MD  Shann Medal, MD  Georganna Skeans, MD   -Stable  -Pain is decreasing  -More mobile, walk  -Appetite is low  -Bowels are very active     Plan:  -continue solid food diet. Supplement with Boost shakes when unable to eat much solid food.  -Anti-ileus protocol. -change narcotics -follow BP -Monitor pedal edema- look for decrease day by day -low UOP and inc Cr- prob dehydrated - IVF bolus & follow.  Keep foley for now -VTE prophylaxis- SCDs, etc -mobilize as tolerated to help recovery - had issues w BLE neuropathy/standing - PT/OT evals -d/c tele - move to surg floor -path on SB benign - d/w pt   Darcella Gasman, PA-S [facility]  Adin Hector, M.D., F.A.C.S. Gastrointestinal and Minimally Invasive Surgery [facility] [address], [place], [place] [address] [phone] Main / Paging   01/01/2014   Results:   Labs: Results for orders placed during the hospital encounter of 12/28/13 (from the past 48 hour(s))  GLUCOSE, CAPILLARY     Status: Abnormal   Collection Time    12/30/13  9:55 PM      Result Value Ref Range   Glucose-Capillary 116 (*) 70 - 99 mg/dL   Comment 1 Documented in Chart     Comment 2 Notify RN    BASIC METABOLIC PANEL     Status: Abnormal   Collection Time    12/31/13  4:02 AM      Result Value Ref Range   Sodium 137  137 - 147 mEq/L   Potassium 4.8  3.7 - 5.3 mEq/L   Chloride 105  96 - 112 mEq/L   CO2 24  19 - 32 mEq/L   Glucose, Bld 95  70 - 99 mg/dL   BUN 13  6 - 23 mg/dL   Creatinine, Ser 1.02  0.50 - 1.10 mg/dL   Calcium 8.3 (*) 8.4 - 10.5 mg/dL   GFR calc non Af Amer 52 (*) >90 mL/min   GFR calc Af Amer 60 (*) >90 mL/min   Comment: (NOTE)     The eGFR has been calculated using the CKD EPI equation.     This calculation has not been validated in all clinical situations.     eGFR's persistently <90 mL/min signify possible Chronic Kidney     Disease.  CBC     Status: Abnormal   Collection Time     12/31/13  4:02 AM      Result Value Ref Range   WBC 9.7  4.0 - 10.5 K/uL   RBC 2.92 (*) 3.87 - 5.11 MIL/uL   Hemoglobin 8.6 (*) 12.0 - 15.0 g/dL   HCT 27.3 (*) 36.0 - 46.0 %   MCV 93.5  78.0 - 100.0 fL   MCH 29.5  26.0 - 34.0 pg   MCHC 31.5  30.0 - 36.0 g/dL   RDW 16.3 (*) 11.5 - 15.5 %   Platelets 162  150 - 400 K/uL    Imaging / Studies: No results found.  Medications / Allergies: per chart  Antibiotics: Anti-infectives   Start     Dose/Rate Route Frequency Ordered Stop  12/28/13 2000  clindamycin (CLEOCIN) IVPB 900 mg     900 mg 100 mL/hr over 30 Minutes Intravenous 3 times per day 12/28/13 1634 12/28/13 2106   12/28/13 0745  clindamycin (CLEOCIN) IVPB 900 mg     900 mg 100 mL/hr over 30 Minutes Intravenous On call 12/28/13 0732 12/28/13 0751   12/28/13 0730  clindamycin (CLEOCIN) IVPB 600 mg  Status:  Discontinued    Comments:  Pharmacy may adjust dosing strength, interval, or rate of medication as needed for optimal therapy for the patient Send with patient on call to the OR.  Anesthesia to complete antibiotic administration [facility]13mn [facility]   600 mg 100 mL/hr over 30 Minutes Intravenous On call to O.R. 12/28/13 [id number]/23/15 0731   12/28/13 0730  gentamicin (GARAMYCIN) IVPB 100 mg  Status:  Discontinued    Comments:  Pharmacy may adjust dosing strength, schedule, rate of infusion, etc as needed to optimize therapy Send with patient on call to the OR.  Anesthesia to complete antibiotic administration <675m [facility]  100 mg 200 mL/hr over 30 Minutes Intravenous On call to O.R. 12/28/13 0717 12/28/13 0725   12/28/13 0730  gentamicin (GARAMYCIN) 460 mg in dextrose 5 % 100 mL IVPB     460 mg 223 mL/hr over 30 Minutes Intravenous On call 12/28/13 0727 12/28/13 0802       Note: This dictation was prepared with Dragon/digital dictation along with Smartphrase technology. Any transcriptional errors that result from  this process are unintentional.

## 2014-01-01 NOTE — Progress Notes (Signed)
Patient ambulated in halls with nursing staff x3 and was up in chair x1. Tolerated activity well. Pain controlled, no PRN pain medications given during day shift. Will continue to monitor patient and encourage ambulation. J.Harbor Vanover, RN

## 2014-01-02 LAB — BASIC METABOLIC PANEL
BUN: 11 mg/dL (ref 6–23)
CALCIUM: 8.5 mg/dL (ref 8.4–10.5)
CHLORIDE: 102 meq/L (ref 96–112)
CO2: 25 meq/L (ref 19–32)
Creatinine, Ser: 0.9 mg/dL (ref 0.50–1.10)
GFR calc Af Amer: 70 mL/min — ABNORMAL LOW (ref >90)
GFR calc non Af Amer: 61 mL/min — ABNORMAL LOW (ref >90)
Glucose, Bld: 97 mg/dL (ref 70–99)
Potassium: 4 mEq/L (ref 3.7–5.3)
SODIUM: 138 meq/L (ref 137–147)

## 2014-01-02 LAB — CBC
HEMATOCRIT: 27.6 % — AB (ref 36.0–46.0)
Hemoglobin: 8.8 g/dL — ABNORMAL LOW (ref 12.0–15.0)
MCH: 29.4 pg (ref 26.0–34.0)
MCHC: 31.9 g/dL (ref 30.0–36.0)
MCV: 92.3 fL (ref 78.0–100.0)
PLATELETS: 225 10*3/uL (ref 150–400)
RBC: 2.99 MIL/uL — ABNORMAL LOW (ref 3.87–5.11)
RDW: 16 % — ABNORMAL HIGH (ref 11.5–15.5)
WBC: 8.7 10*3/uL (ref 4.0–10.5)

## 2014-01-02 LAB — CLOSTRIDIUM DIFFICILE BY PCR: Toxigenic C. Difficile by PCR: NEGATIVE

## 2014-01-02 LAB — PROTIME-INR
INR: 1.63 — ABNORMAL HIGH (ref 0.00–1.49)
PROTHROMBIN TIME: 18.9 s — AB (ref 11.6–15.2)

## 2014-01-02 MED ORDER — FERROUS SULFATE 325 (65 FE) MG PO TABS
325.0000 mg | ORAL_TABLET | Freq: Every day | ORAL | Status: DC
  Administered 2014-01-02 – 2014-01-03 (×2): 325 mg via ORAL
  Filled 2014-01-02 (×3): qty 1

## 2014-01-02 MED ORDER — WARFARIN SODIUM 4 MG PO TABS
4.0000 mg | ORAL_TABLET | Freq: Once | ORAL | Status: AC
  Administered 2014-01-02: 4 mg via ORAL
  Filled 2014-01-02: qty 1

## 2014-01-02 MED ORDER — BISMUTH SUBSALICYLATE 262 MG/15ML PO SUSP
30.0000 mL | Freq: Two times a day (BID) | ORAL | Status: AC
  Administered 2014-01-02 – 2014-01-03 (×4): 30 mL via ORAL
  Filled 2014-01-02: qty 236

## 2014-01-02 NOTE — Progress Notes (Signed)
Inc BP - clowly inc BP meds Bowel regimen to thicken stools. Wean off alvimopan per colectomy protocol  I updated the patient's status to the patient.  Recommendations were made.  Questions were answered.  The patient expressed understanding & appreciation.   Adin Hector, M.D., F.A.C.S. Gastrointestinal and Minimally Invasive Surgery [facility] [address], [place], [place] [address] [phone] Main / Paging

## 2014-01-02 NOTE — Progress Notes (Signed)
[place], MD, [address] [place], [place] [address] Phone: [phone] FAX: [phone]    Yolanda Spears [id number] 24-Sep-1934  CARE TEAM:  PCP: Thressa Sheller, MD  Outpatient Care Team: Patient Care Team: Thressa Sheller, MD as PCP - General [name], MD as Consulting Physician (Gynecologic Oncology) Darlin Coco, MD as Consulting Physician (Cardiology) Jerene Bears, MD as Consulting Physician (Gastroenterology) Adin Hector, MD as Consulting Physician (General Surgery)  Inpatient Treatment Team: Treatment Team: Attending Provider: Adin Hector, MD; Registered Nurse: Clenton Pare, RN; Student Nurse: Carver Fila, Student-RN; Registered Nurse: Patton Salles, RN; Registered Nurse: Morrison Old, RN; Physician Assistant: Clinton Gallant, NT; Student Nurse: Assunta Curtis, Student-RN; Technician: Andres Labrum, NT; Registered Nurse: Lyda Jester, RN  Subjective:  Appetite fair.  Taking some PO supp Still w loose BMs - didn't want Pepto/psyillium.  RNs ordered Cdiff & placed on enteric precautions Walking w nursing/PT No abd pain Mild nausea x 1 Likes wearing an abd binder, esp w cough, getting up  Objective:  Vital signs:  Filed Vitals:   01/01/14 0900 01/01/14 1300 01/01/14 2037 01/02/14 0300  BP: 142/81 162/56 160/68 138/61  Pulse: 89 88 80 71  Temp: 98.1 F (36.7 C)  98.2 F (36.8 C) 98.4 F (36.9 C)  TempSrc: Oral  Oral Oral  Resp: 20  20 20   Height:      Weight:    206 lb 6.4 oz (93.622 kg)  SpO2: 96%  96% 98%    Last BM Date: 01/01/14  Intake/Output   Yesterday:  04/27 0701 - 04/28 0700 In: 430 [P.O.:380; I.V.:50] Out: -  This shift:     Bowel function:  Flatus: y  BM: loose x5 last evening  Drain: n/a  Physical Exam:  General: Pt awake/alert/oriented x4 in no acute distress Eyes: PERRL, normal EOM.  Sclera  clear.  No icterus Neuro: CN II-XII intact w/o focal sensory/motor deficits. Lymph: No head/neck/groin lymphadenopathy Psych:  No delerium/psychosis/paranoia.  Talkative.  Pleasant HENT: Normocephalic, Mucus membranes moist.  No thrush Neck: Supple, No tracheal deviation Chest: No chest wall pain w good excursion CV:  Pulses intact.  Regular rhythm MS: Normal AROM mjr joints.  No obvious deformity Abdomen: Soft.  Nondistended.  .  Min tender at incision.  Mild skin separation 1cm distal 1/4 of incision - stable.  No blood. No evidence of peritonitis.  No incarcerated hernias. Ext:  SCDs BLE.  No mjr edema.  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 female  5 Days Post-Op  Procedure(s):  POST-OPERATIVE DIAGNOSIS: recurrent bowel obstructions with possible ileal thickening from prior radiation enteritis   PROCEDURE: Procedure(s):  LAPAROSCOPIC LYSIS OF ADHESIONS x2 hours  OPEN LYSIS OF ADHESIONS X 2 hours  SMALL BOWEL RESECTION (ileum)  SEROSAL REPAIRS X4  ON-Q PLACEMENT  SURGEON: Surgeon(s):  Adin Hector, MD  Shann Medal, MD  Georganna Skeans, MD   Post-op diarrhea.  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  Enetereg not stopped - i d/c'd -retry pepto/low fiber.  Add iron for anemia &  constipation -change narcotics -SBP better w inc metoprolol & restarting lisinopril.  Cr OK.  Keep off diuretics & just medlock for now -VTE prophylaxis- SCDs, etc -mobilize as tolerated to help recovery - had issues w BLE neuropathy/standing - PT/OT evals.  Set up for SNF.  I d/w her at length yesterday  -stable off tele - move to surg floor -path on SB benign - d/w  pt  Adin Hector, M.D., F.A.C.S. Gastrointestinal and Minimally Invasive Surgery [facility] [address] [place], [place] [address] [phone] Main / Paging   01/02/2014   Results:   Labs: Results for orders placed during the hospital encounter of 12/28/13 (from the past 48 hour(s))  PROTIME-INR     Status: Abnormal   Collection Time    01/01/14  9:50 AM      Result Value Ref Range   Prothrombin Time 16.3 (*) 11.6 - 15.2 seconds   INR 1.34  0.00 - 7.20  BASIC METABOLIC PANEL     Status: Abnormal   Collection Time    01/02/14  3:45 AM      Result Value Ref Range   Sodium 138  137 - 147 mEq/L   Potassium 4.0  3.7 - 5.3 mEq/L   Chloride 102  96 - 112 mEq/L   CO2 25  19 - 32 mEq/L   Glucose, Bld 97  70 - 99 mg/dL   BUN 11  6 - 23 mg/dL   Creatinine, Ser 0.90  0.50 - 1.10 mg/dL   Calcium 8.5  8.4 - 10.5 mg/dL   GFR calc non Af Amer 61 (*) >90 mL/min   GFR calc Af Amer 70 (*) >90 mL/min   Comment: (NOTE)     The eGFR has been calculated using the CKD EPI equation.     This calculation has not been validated in all clinical situations.     eGFR's persistently <90 mL/min signify possible Chronic Kidney     Disease.  CBC     Status: Abnormal   Collection Time    01/02/14  3:45 AM      Result Value Ref Range   WBC 8.7  4.0 - 10.5 K/uL   RBC 2.99 (*) 3.87 - 5.11 MIL/uL   Hemoglobin 8.8 (*) 12.0 - 15.0 g/dL   HCT 27.6 (*) 36.0 - 46.0 %   MCV 92.3  78.0 - 100.0 fL   MCH 29.4  26.0 - 34.0 pg   MCHC 31.9  30.0 - 36.0 g/dL   RDW 16.0 (*) 11.5 - 15.5 %   Platelets 225  150 - 400 K/uL   Comment: DELTA CHECK NOTED     REPEATED TO VERIFY  PROTIME-INR     Status: Abnormal   Collection Time    01/02/14  3:45 AM      Result Value Ref Range   Prothrombin Time 18.9 (*) 11.6 - 15.2 seconds   INR 1.63 (*) 0.00 - 1.49    Imaging / Studies: No results found.  Medications / Allergies: per chart  Antibiotics: Anti-infectives   Start      Dose/Rate Route Frequency Ordered Stop   12/28/13 2000  clindamycin (CLEOCIN) IVPB 900 mg     900 mg 100 mL/hr over 30 Minutes Intravenous 3 times per day 12/28/13 1634 12/28/13 2106   12/28/13 0745  clindamycin (CLEOCIN) IVPB 900 mg     900 mg 100 mL/hr over 30 Minutes Intravenous On call 12/28/13 0732 12/28/13  [id number]   12/28/13 0730  clindamycin (CLEOCIN) IVPB 600 mg  Status:  Discontinued    Comments:  Pharmacy may adjust dosing strength, interval, or rate of medication as needed for optimal therapy for the patient Send with patient on call to the OR.  Anesthesia to complete antibiotic administration [facility]60mn [facility]   600 mg 100 mL/hr over 30 Minutes Intravenous On call to O.R. 12/28/13 0633304/23/15 0731   12/28/13 0730  gentamicin (GARAMYCIN) IVPB 100 mg  Status:  Discontinued    Comments:  Pharmacy may adjust dosing strength, schedule, rate of infusion, etc as needed to optimize therapy Send with patient on call to the OR.  Anesthesia to complete antibiotic administration <672m [facility]  100 mg 200 mL/hr over 30 Minutes Intravenous On call to O.R. 12/28/13 0717 12/28/13 0725   12/28/13 0730  gentamicin (GARAMYCIN) 460 mg in dextrose 5 % 100 mL IVPB     460 mg 223 mL/hr over 30 Minutes Intravenous On call 12/28/13 0727 12/28/13 0802       Note: This dictation was prepared with Dragon/digital dictation along with Smartphrase technology. Any transcriptional errors that result from this process are unintentional.

## 2014-01-02 NOTE — Progress Notes (Signed)
ANTICOAGULATION CONSULT NOTE - Initial Consult  Pharmacy Consult for Warfarin Indication: atrial fibrillation  Allergies  Allergen Reactions  . Other     Contrast dye   . Penicillins Itching  . Shellfish-Derived Products Nausea And Vomiting  . Statins     Myalgias,elevated LFT studies  . Iohexol Hives and Itching    Itching in eyes, one hive on face.   Patient Measurements: Height: 5\' 7"  (170.2 cm) Weight: 206 lb 6.4 oz (93.622 kg) IBW/kg (Calculated) : 61.6  Vital Signs: Temp: 98.2 F (36.8 C) (04/28 1003) Temp src: Oral (04/28 1003) BP: 195/84 mmHg (04/28 1003) Pulse Rate: 96 (04/28 1003)  Labs:  Recent Labs  12/31/13 0402 01/01/14 0950 01/02/14 0345  HGB 8.6*  --  8.8*  HCT 27.3*  --  27.6*  PLT 162  --  225  LABPROT  --  16.3* 18.9*  INR  --  1.34 1.63*  CREATININE 1.02  --  0.90   Medications:  Scheduled:  . acetaminophen  1,000 mg Oral TID  . antiseptic oral rinse  15 mL Mouth Rinse BID  . aspirin  81 mg Oral Daily  . bismuth subsalicylate  30 mL Oral BID  . calcium carbonate  1 tablet Oral Daily  . ezetimibe  5 mg Oral QPM  . ferrous sulfate  325 mg Oral Q breakfast  . heparin subcutaneous  5,000 Units Subcutaneous 3 times per day  . hydrALAZINE  25 mg Oral BID  . lactose free nutrition  237 mL Oral TID WC  . lip balm  1 application Topical BID  . lisinopril  10 mg Oral BID  . metoprolol tartrate  25 mg Oral BID  . pregabalin  75 mg Oral BID  . propafenone  225 mg Oral 3 times per day  . psyllium  1 packet Oral BID  . saccharomyces boulardii  250 mg Oral BID  . Warfarin - Pharmacist Dosing Inpatient   Does not apply q1800   Assessment: 79 YO presented with partial small bowel obstruction-recurrent-s/p lysis of adhesions on 4/23. Hx of AFib, chronic Warfarin 1.25mg  on MWF and 2.5mg  the rest of the week, last dose on 4/17. Admit INR 1.30  SQ heparin 5000 units q8h since 4/23.   INR 1.63 today after Warfarin 4mg , resumed yesterday  Regular  diet, good po intake, no sign of bleed  Loose stools, CDiff negative, Pepto-Bismol added   Goal of Therapy:  INR 2-3   Plan:   Repeat Warfarin 4mg  today  Daily PT/INR  Continue Heparin SQ  Minda Ditto PharmD Pager [phone] 01/02/2014, 11:14 AM

## 2014-01-02 NOTE — Progress Notes (Signed)
CSW met with pt / sister to offer support and assist with d/c planning. Pt has chosen [facility] for ST Rehab. D/C date has not yet been determined. Pt / family encouraged to have another SNF choice in mind incase [facility] doesn't have an opening at time of d/c. Insurance  coverage for SNF and ambulance transport reviewed. Pt / family are in agreement with d/c plan.  Werner Lean LCSW [phone]

## 2014-01-03 LAB — PROTIME-INR
INR: 3.06 — ABNORMAL HIGH (ref 0.00–1.49)
Prothrombin Time: 30.5 seconds — ABNORMAL HIGH (ref 11.6–15.2)

## 2014-01-03 MED ORDER — FERROUS SULFATE 325 (65 FE) MG PO TABS
325.0000 mg | ORAL_TABLET | Freq: Two times a day (BID) | ORAL | Status: DC
  Administered 2014-01-03 – 2014-01-04 (×2): 325 mg via ORAL
  Filled 2014-01-03 (×4): qty 1

## 2014-01-03 NOTE — Progress Notes (Signed)
[place], MD, [address] [place], [place] [address] Phone: [phone] FAX: [phone]    JASLENE MARSTELLER [id number] Sep 09, 1934  CARE TEAM:  PCP: Thressa Sheller, MD  Outpatient Care Team: Patient Care Team: Thressa Sheller, MD as PCP - General [name], MD as Consulting Physician (Gynecologic Oncology) Darlin Coco, MD as Consulting Physician (Cardiology) Jerene Bears, MD as Consulting Physician (Gastroenterology) Adin Hector, MD as Consulting Physician (General Surgery)  Inpatient Treatment Team: Treatment Team: Attending Provider: Adin Hector, MD; Student Nurse: Carver Fila, Student-RN; Registered Nurse: Patton Salles, RN; Registered Nurse: Morrison Old, RN; Student Nurse: Assunta Curtis, Student-RN; Physical Therapist: Nathanial Rancher, PTA  Subjective:  Appetite a little better.  Taking some PO supp Still w loose BMs but thickening- taking Pepto/psyillium.   Walking w nursing/PT Mild RLQ abd pain Mild nausea x 1 Likes wearing an abd binder, esp w cough, getting up Many questions  Objective:  Vital signs:  Filed Vitals:   01/02/14 1003 01/02/14 1356 01/03/14 0607 01/03/14 0645  BP: 195/84 172/75 136/65   Pulse: 96 73 74   Temp: 98.2 F (36.8 C) 97.9 F (36.6 C) 98.3 F (36.8 C)   TempSrc: Oral Oral Oral   Resp:  18 18   Height:      Weight:    204 lb 3.2 oz (92.625 kg)  SpO2: 97% 97% 92%     Last BM Date: 01/02/14  Intake/Output   Yesterday:  04/28 0701 - 04/29 0700 In: 600 [P.O.:600] Out: -  This shift:     Bowel function:  Flatus: y  BM: loose x2 last evening  Drain: n/a  Physical Exam:  General: Pt awake/alert/oriented x4 in no acute distress Eyes: PERRL, normal EOM.  Sclera clear.  No icterus Neuro: CN II-XII intact w/o focal sensory/motor deficits. Lymph: No head/neck/groin lymphadenopathy Psych:  No  delerium/psychosis/paranoia.  Talkative - interrupting a lot but not quite pressured speech.  Pleasant HENT: Normocephalic, Mucus membranes moist.  No thrush Neck: Supple, No tracheal deviation Chest: No chest wall pain w good excursion CV:  Pulses intact.  Regular rhythm MS: Normal AROM mjr joints.  No obvious deformity Abdomen: Soft.  Nondistended.  .  Min tender at incision.  Mild skin separation 1cm wide distal 1/4 of incision - stable.  No blood. No evidence of peritonitis.  No incarcerated hernias. Ext:  SCDs BLE.  No mjr edema.  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 female  6 Days Post-Op  Procedure(s):  POST-OPERATIVE DIAGNOSIS: recurrent bowel obstructions with possible ileal thickening from prior radiation enteritis   PROCEDURE: Procedure(s):  LAPAROSCOPIC LYSIS OF ADHESIONS x2 hours  OPEN LYSIS OF ADHESIONS X 2 hours  SMALL BOWEL RESECTION (ileum)  SEROSAL REPAIRS X4  ON-Q PLACEMENT  SURGEON: Surgeon(s):  Adin Hector, MD  Shann Medal, MD  Georganna Skeans, MD   Post-op diarrhea resolving, prob from chronic partial PSBO  Plan:  -Heart healthy diet -retry pepto/low fiber.  Add iron for anemia & constipation -change narcotics -SBP better w inc metoprolol & restarting lisinopril.  Cr OK.  Keep off diuretics & just medlock for now -VTE prophylaxis- SCDs, etc -mobilize as tolerated to help recovery - had issues w BLE neuropathy/standing - PT/OT evals.   -stable off tele - move to surg floor -path on SB benign - d/w pt -INR 3 - stop heparin & prob back off warfarin  Set up for SNF.  Prob 4/30 if diarrhea continues to resolve & eating better.   I updated the  patient's status to the patient.  OR findings & goals of post-op care.  Interrupting w more questions but I think she understood after repeated explanations.  Recommendations were made.  The patient expressed understanding & appreciation.     Adin Hector, M.D., F.A.C.S. Gastrointestinal and Minimally Invasive Surgery [facility] [address] [place],  [address] [phone] Main / Paging   01/03/2014   Results:   Labs: Results for orders placed during the hospital encounter of 12/28/13 (from the past 48 hour(s))  PROTIME-INR     Status: Abnormal   Collection Time    01/01/14  9:50 AM      Result Value Ref Range   Prothrombin Time 16.3 (*) 11.6 - 15.2 seconds   INR 1.34  0.00 - 4.65  BASIC METABOLIC PANEL     Status: Abnormal   Collection Time    01/02/14  3:45 AM      Result Value Ref Range   Sodium 138  137 - 147 mEq/L   Potassium 4.0  3.7 - 5.3 mEq/L   Chloride 102  96 - 112 mEq/L   CO2 25  19 - 32 mEq/L   Glucose, Bld 97  70 - 99 mg/dL   BUN 11  6 - 23 mg/dL   Creatinine, Ser 0.90  0.50 - 1.10 mg/dL   Calcium 8.5  8.4 - 10.5 mg/dL   GFR calc non Af Amer 61 (*) >90 mL/min   GFR calc Af Amer 70 (*) >90 mL/min   Comment: (NOTE)     The eGFR has been calculated using the CKD EPI equation.     This calculation has not been validated in all clinical situations.     eGFR's persistently <90 mL/min signify possible Chronic Kidney     Disease.  CBC     Status: Abnormal   Collection Time    01/02/14  3:45 AM      Result Value Ref Range   WBC 8.7  4.0 - 10.5 K/uL   RBC 2.99 (*) 3.87 - 5.11 MIL/uL   Hemoglobin 8.8 (*) 12.0 - 15.0 g/dL   HCT 27.6 (*) 36.0 - 46.0 %   MCV 92.3  78.0 - 100.0 fL   MCH 29.4  26.0 - 34.0 pg   MCHC 31.9  30.0 - 36.0 g/dL   RDW 16.0 (*) 11.5 - 15.5 %   Platelets 225  150 - 400 K/uL   Comment: DELTA CHECK NOTED     REPEATED TO VERIFY  PROTIME-INR     Status: Abnormal   Collection Time     01/02/14  3:45 AM      Result Value Ref Range   Prothrombin Time 18.9 (*) 11.6 - 15.2 seconds   INR 1.63 (*) 0.00 - 1.49  CLOSTRIDIUM DIFFICILE BY PCR     Status: None   Collection Time    01/02/14  6:08 AM      Result Value Ref Range   C difficile by pcr NEGATIVE  NEGATIVE  Comment: Performed at [facility]     Status: Abnormal   Collection Time    01/03/14  4:10 AM      Result Value Ref Range   Prothrombin Time 30.5 (*) 11.6 - 15.2 seconds   INR 3.06 (*) 0.00 - 1.49    Imaging / Studies: No results found.  Medications / Allergies: per chart  Antibiotics: Anti-infectives   Start     Dose/Rate Route Frequency Ordered Stop   12/28/13 2000  clindamycin (CLEOCIN) IVPB 900 mg     900 mg 100 mL/hr over 30 Minutes Intravenous 3 times per day 12/28/13 1634 12/28/13 2106   12/28/13 0745  clindamycin (CLEOCIN) IVPB 900 mg     900 mg 100 mL/hr over 30 Minutes Intravenous On call 12/28/13 0732 12/28/13 0751   12/28/13 0730  clindamycin (CLEOCIN) IVPB 600 mg  Status:  Discontinued    Comments:  Pharmacy may adjust dosing strength, interval, or rate of medication as needed for optimal therapy for the patient Send with patient on call to the OR.  Anest[facility] to complete antibiotic administration [facility]mn [facility]   600 mg 100 mL/hr over 30 Minutes Intravenous On call to O.R. 12/28/13 0887104/23/15 0731   12/28/13 0730  gentamicin (GARAMYCIN) IVPB 100 mg  Status:  Discontinued    Comments:  Pharmacy may adjust dosing strength, schedule, rate of infusion, etc as needed to optimize therapy Send with patient on call to the OR.  Anesthesia to complete antibiotic administration <678m [facility]  100 mg 200 mL/hr over 30 Minutes Intravenous On call to O.R. 12/28/13 0717 12/28/13 0725   12/28/13 0730  gentamicin (GARAMYCIN) 460 mg in dextrose 5 % 100 mL IVPB     460 mg 223 mL/hr over 30 Minutes Intravenous On call  12/28/13 0727 12/28/13 0802       Note: This dictation was prepared with Dragon/digital dictation along with Smartphrase technology. Any transcriptional errors that result from this process are unintentional.

## 2014-01-03 NOTE — Progress Notes (Signed)
Physical Therapy Treatment Patient Details Name: Yolanda Spears MRN: [id number] DOB: 05-13-1935 Today's Date: 01/03/2014    History of Present Illness Pt is a 79 year old female admitted due to recurrent bowel obstructions with possible ileal thickening from prior radiation enteritis and s/p SBO resection.  Pt with PMHx of stroke, recurrent SBO, breast cancer, endometrial cancer s/p radiation, arthritis, pacemaker and peripheral neuropathy    PT Comments    Pt feeling better.  Amb with 4 WW this session.  Stated her son is looking to buy one off the internet.  Follow Up Recommendations  SNF     Equipment Recommendations       Recommendations for Other Services       Precautions / Restrictions Precautions Precautions: Fall Precaution Comments: abdominal binder Restrictions Weight Bearing Restrictions: No    Mobility  Bed Mobility Overal bed mobility: Needs Assistance Bed Mobility: Supine to Sit;Sit to Supine     Supine to sit: Min guard;Min assist Sit to supine: Min guard;Min assist   General bed mobility comments: increased time and use of rail  Transfers Overall transfer level: Needs assistance Equipment used: None Transfers: Sit to/from Stand Sit to Stand: Min guard;Min assist         General transfer comment: verbal cues hand placement assist to rise and steady, control descent to toilet  Ambulation/Gait Ambulation/Gait assistance: Supervision;Min guard Ambulation Distance (Feet): 145 Feet Assistive device: 4-wheeled walker Gait Pattern/deviations: Step-through pattern;Trunk flexed Gait velocity: decreased   General Gait Details: amb with 4 WW this session with ,25% VC's safety esp with turns and 50% VC's onproper use of brakes. Pt liked the 4 WW and stated her son is looking for one on the internet.   Stairs            Wheelchair Mobility    Modified Rankin (Stroke Patients Only)       Balance                                     Cognition                            Exercises      General Comments        Pertinent Vitals/Pain     Home Living                      Prior Function            PT Goals (current goals can now be found in the care plan section) Progress towards PT goals: Progressing toward goals    Frequency  Min 3X/week    PT Plan      Co-evaluation             End of Session Equipment Utilized During Treatment: Gait belt Activity Tolerance: Patient tolerated treatment well Patient left: in bed;with call bell/phone within reach;with family/visitor present;with nursing/sitter in room     Time: 1000-1025 PT Time Calculation (min): 25 min  Charges:  $Gait Training: 8-22 mins $Therapeutic Activity: 8-22 mins                    G Codes:      Yolanda Spears  PTA [facility]  Acute  Rehab Pager      [phone]

## 2014-01-03 NOTE — Progress Notes (Signed)
ANTICOAGULATION CONSULT NOTE - Follow up  Pharmacy Consult for Warfarin Indication: atrial fibrillation  Allergies  Allergen Reactions  . Other Hives    Contrast dye   . Penicillins Itching  . Shellfish-Derived Products Nausea And Vomiting  . Statins     Myalgias,elevated LFT studies  . Iohexol Hives and Itching    Itching in eyes, one hive on face.   Patient Measurements: Height: 5\' 7"  (170.2 cm) Weight: 204 lb 3.2 oz (92.625 kg) IBW/kg (Calculated) : 61.6  Vital Signs: Temp: 98.3 F (36.8 C) (04/29 0607) Temp src: Oral (04/29 0607) BP: 136/65 mmHg (04/29 0607) Pulse Rate: 74 (04/29 0607)  Labs:  Recent Labs  01/01/14 0950 01/02/14 0345 01/03/14 0410  HGB  --  8.8*  --   HCT  --  27.6*  --   PLT  --  225  --   LABPROT 16.3* 18.9* 30.5*  INR 1.34 1.63* 3.06*  CREATININE  --  0.90  --    Medications:  Scheduled:  . acetaminophen  1,000 mg Oral TID  . antiseptic oral rinse  15 mL Mouth Rinse BID  . aspirin  81 mg Oral Daily  . bismuth subsalicylate  30 mL Oral BID  . calcium carbonate  1 tablet Oral Daily  . ezetimibe  5 mg Oral QPM  . ferrous sulfate  325 mg Oral BID WC  . hydrALAZINE  25 mg Oral BID  . lactose free nutrition  237 mL Oral TID WC  . lip balm  1 application Topical BID  . lisinopril  10 mg Oral BID  . metoprolol tartrate  25 mg Oral BID  . pregabalin  75 mg Oral BID  . propafenone  225 mg Oral 3 times per day  . psyllium  1 packet Oral BID  . saccharomyces boulardii  250 mg Oral BID  . Warfarin - Pharmacist Dosing Inpatient   Does not apply q1800   Assessment: 79 YO presented with partial small bowel obstruction-recurrent-s/p lysis of adhesions on 4/23. Hx of AFib, chronic Warfarin 1.25mg  on MWF and 2.5mg  the rest of the week, last dose on 4/17. Admit INR 1.30.  INR rose quickly to 3.06 today after 4mg  x 2.   SQ heparin stopped by MD.  Also on ASA 81mg  and Pepto(bismuth subsalicylate). No PPI or H2RA currently or PTA.  CBC was ok  4/28. No bleeding reported/documented.  Regular diet, pretty good intake charted.  Loose stools, CDiff negative, Pepto-Bismol added.  Goal of Therapy:  INR 2-3   Plan:   Hold warfarin today.  Daily PT/INR.  Romeo Rabon, PharmD, pager [phone]. 01/03/2014,11:40 AM.

## 2014-01-04 DIAGNOSIS — K648 Other hemorrhoids: Secondary | ICD-10-CM

## 2014-01-04 LAB — PROTIME-INR
INR: 4.05 — ABNORMAL HIGH (ref 0.00–1.49)
Prothrombin Time: 37.8 seconds — ABNORMAL HIGH (ref 11.6–15.2)

## 2014-01-04 MED ORDER — WITCH HAZEL-GLYCERIN EX PADS
1.0000 "application " | MEDICATED_PAD | CUTANEOUS | Status: DC | PRN
  Filled 2014-01-04: qty 100

## 2014-01-04 MED ORDER — HYDROCORTISONE 2.5 % RE CREA: 1.0000 "application " | TOPICAL_CREAM | Freq: Two times a day (BID) | RECTAL | Status: DC

## 2014-01-04 MED ORDER — HYDROCORTISONE 2.5 % RE CREA
TOPICAL_CREAM | Freq: Three times a day (TID) | RECTAL | Status: DC
  Administered 2014-01-04: 10:00:00 via RECTAL
  Filled 2014-01-04: qty 28.35

## 2014-01-04 MED ORDER — FERROUS SULFATE 325 (65 FE) MG PO TABS: 325.0000 mg | ORAL_TABLET | Freq: Two times a day (BID) | ORAL | Status: DC

## 2014-01-04 MED ORDER — WARFARIN SODIUM 2.5 MG PO TABS: 1.2500 mg | ORAL_TABLET | Freq: Every evening | ORAL | Status: DC

## 2014-01-04 MED ORDER — OXYCODONE HCL 5 MG PO TABS: 5.0000 mg | ORAL_TABLET | Freq: Four times a day (QID) | ORAL | Status: DC | PRN

## 2014-01-04 NOTE — Progress Notes (Signed)
Report called to [place] at [facility].

## 2014-01-04 NOTE — Discharge Summary (Signed)
Physician Discharge Summary  Patient ID: Yolanda Spears MRN: [id number] DOB/AGE: 1934/11/17 79 y.o.  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  She underwent lap/open lysis of adhesions with SB resection.  Postoperatively, the patient mobilized in the hallways and advanced to a solid diet gradually.  No telemetry events so transferred to the floor.  Pain was well-controlled and transitioned off IV medications.  She did struggle with post-operative diarrhea.  C. Diff was negative.  BMs loose but thickening.  Warfarin restarted  By the time of discharge, the patient was walking well the hallways, eating food well, having flatus.  Pain was-controlled on an oral regimen.  PT/OT consults done - SNF recommended.  Based on meeting DC criteria and recovering well, I felt it was safe for the patient to be discharged to SNF with close followup.  Instructions were discussed in detail.  They are written as well.  Consults: None  Significant Diagnostic Studies:   Results for orders placed during the hospital encounter of 12/28/13 (from the past 72 hour(s))  PROTIME-INR     Status: Abnormal   Collection Time    01/01/14  9:50 AM      Result Value Ref Range   Prothrombin Time 16.3 (*) 11.6 - 15.2 seconds   INR 1.34  0.00 - 7.06  BASIC METABOLIC PANEL     Status: Abnormal   Collection Time    01/02/14  3:45 AM      Result Value Ref Range   Sodium 138  137 - 147 mEq/L   Potassium 4.0  3.7 - 5.3 mEq/L   Chloride 102  96 - 112 mEq/L   CO2 25  19 - 32 mEq/L   Glucose, Bld 97  70  - 99 mg/dL   BUN 11  6 - 23 mg/dL   Creatinine, Ser 0.90  0.50 - 1.10 mg/dL   Calcium 8.5  8.4 - 10.5 mg/dL   GFR calc non Af Amer 61 (*) >90 mL/min   GFR calc Af Amer 70 (*) >90 mL/min   Comment: (NOTE)     The eGFR has been calculated using the CKD EPI equation.     This calculation has not been validated in all clinical situations.     eGFR's persistently <90 mL/min signify possible Chronic Kidney     Disease.  CBC     Status: Abnormal   Collection Time    01/02/14  3:45 AM      Result Value Ref Range   WBC 8.7  4.0 - 10.5 K/uL   RBC 2.99 (*) 3.87 - 5.11 MIL/uL   Hemoglobin 8.8 (*) 12.0 - 15.0 g/dL   HCT 27.6 (*) 36.0 - 46.0 %   MCV 92.3  78.0 - 100.0 fL   MCH 29.4  26.0 - 34.0 pg   MCHC 31.9  30.0 - 36.0 g/dL   RDW 16.0 (*) 11.5 - 15.5 %   Platelets 225  150 - 400 K/uL   Comment: DELTA CHECK NOTED     REPEATED TO VERIFY  PROTIME-INR     Status: Abnormal   Collection Time  01/02/14  3:45 AM      Result Value Ref Range   Prothrombin Time 18.9 (*) 11.6 - 15.2 seconds   INR 1.63 (*) 0.00 - 1.49  CLOSTRIDIUM DIFFICILE BY PCR     Status: None   Collection Time    01/02/14  6:08 AM      Result Value Ref Range   C difficile by pcr NEGATIVE  NEGATIVE   Comment: Performed at [place]     Status: Abnormal   Collection Time    01/03/14  4:10 AM      Result Value Ref Range   Prothrombin Time 30.5 (*) 11.6 - 15.2 seconds   INR 3.06 (*) 0.00 - 1.49  PROTIME-INR     Status: Abnormal   Collection Time    01/04/14  4:00 AM      Result Value Ref Range   Prothrombin Time 37.8 (*) 11.6 - 15.2 seconds   INR 4.05 (*) 0.00 - 1.49   Treatments:   POST-OPERATIVE DIAGNOSIS: recurrent bowel obstructions with possible ileal thickening from prior radiation enteritis   PROCEDURE:12/28/2013 LAPAROSCOPIC LYSIS OF ADHESIONS x2 hours  OPEN LYSIS OF ADHESIONS X 2 hours  SMALL BOWEL RESECTION (ileum)  SEROSAL REPAIRS X4  ON-Q PLACEMENT  SURGEON: Surgeon(s):   Adin Hector, MD   Discharge Exam: Blood pressure 137/81, pulse 72, temperature 98.2 F (36.8 C), temperature source Oral, resp. rate 18, height 5' 7"  (1.702 m), weight 204 lb 9.4 oz (92.8 kg), SpO2 97.00%.  General: Pt awake/alert/oriented x4 in no major acute distress Eyes: PERRL, normal EOM. Sclera nonicteric Neuro: CN II-XII intact w/o focal sensory/motor deficits. Lymph: No head/neck/groin lymphadenopathy Psych:  No delerium/psychosis/paranoia.  Very talkative with many appropriate questions HENT: Normocephalic, Mucus membranes moist.  No thrush Neck: Supple, No tracheal deviation Chest: No pain.  Good respiratory excursion. CV:  Pulses intact.  Irrregular rhythm MS: Normal AROM mjr joints.  No obvious deformity Abdomen: Soft, Nondistended.  Min tender at incision.  Mild separation at base of incision.  No incarcerated hernias. Ext:  SCDs BLE.  No significant edema.  No cyanosis Skin: No petechiae / purpura   Disposition: 01-Home or Self Care  Discharge Orders   Future Appointments Provider Department Dept Phone   01/08/2014 11:35 AM [facility] [place] [facility] [phone]   02/27/2014 2:30 PM Anastasio Auerbach, MD [facility] [phone]   03/26/2014 1:30 PM Kendell Bane, [place] [facility]   Future Orders Complete By Expires   Call MD for:  extreme fatigue  As directed    Call MD for:  hives  As directed    Call MD for:  persistant nausea and vomiting  As directed    Call MD for:  redness, tenderness, or signs of infection (pain, swelling, redness, odor or green/yellow discharge around incision site)  As directed    Call MD for:  severe uncontrolled pain  As directed    Call MD for:  As directed    Diet - low sodium heart healthy  As directed    Discharge instructions  As directed    Discharge wound care:  As directed    Driving Restrictions  As directed    Increase activity slowly   As directed    Lifting restrictions  As directed    May shower / Bathe  As directed    May walk up steps  As directed    Sexual  Activity Restrictions  As directed    Walk with assistance  As directed        Medication List    STOP taking these medications       metroNIDAZOLE 500 MG tablet  Commonly known as:  FLAGYL     neomycin 500 MG tablet  Commonly known as:  MYCIFRADIN      TAKE these medications       aspirin 81 MG tablet  Take 81 mg by mouth daily.     calcium carbonate 500 MG chewable tablet  Commonly known as:  TUMS - dosed in mg elemental calcium  Chew 1 tablet by mouth daily.     ezetimibe 10 MG tablet  Commonly known as:  ZETIA  Take 5 mg by mouth every evening. Takes 1/2 tablet     ferrous sulfate 325 (65 FE) MG tablet  Take 1 tablet (325 mg total) by mouth 2 (two) times daily with a meal.     furosemide 20 MG tablet  Commonly known as:  LASIX  Take 20 mg by mouth every morning.     hydrALAZINE 25 MG tablet  Commonly known as:  APRESOLINE  Take 25 mg by mouth 2 (two) times daily.     hydrocortisone 2.5 % rectal cream  Commonly known as:  PROCTOSOL HC  Place 1 application rectally 2 (two) times daily.     lisinopril 10 MG tablet  Commonly known as:  PRINIVIL,ZESTRIL  Take 10 mg by mouth 2 (two) times daily.     METANX PO  Take 1 tablet by mouth daily.     metoprolol succinate 100 MG 24 hr tablet  Commonly known as:  TOPROL-XL  Take 100 mg by mouth every morning. Take with or immediately following a meal.     metoprolol succinate 100 MG 24 hr tablet  Commonly known as:  TOPROL-XL  TAKE 1 TABLET BY MOUTH EVERY DAY WITH OR IMMEDIATELY FOLLOWING A MEAL     oxyCODONE 5 MG immediate release tablet  Commonly known as:  Oxy IR/ROXICODONE  Take 1-2 tablets (5-10 mg total) by mouth every 6 (six) hours as needed for moderate pain, severe pain or breakthrough pain.     pregabalin 75 MG capsule  Commonly known as:  LYRICA  Take 75 mg by mouth 2  (two) times daily.     propafenone 225 MG tablet  Commonly known as:  RYTHMOL  Take 225 mg by mouth every 8 (eight) hours.     Vitamin D-3 5000 UNITS Tabs  Take 1 tablet by mouth 4 (four) times a week. Takes on Saturday, Sunday, Tuesday and Thursday     warfarin 2.5 MG tablet  Commonly known as:  COUMADIN  Take 1.25-2.5 mg by mouth every evening. Takes 1/2 tablet on Monday Wednesday and Friday and 1 tablet the rest of the week           Follow-up Information   Follow up with Hargis Vandyne C., MD In 2 weeks. (To follow up after your operation, To follow up after your hospital stay)    Specialty:  General Surgery   Contact information:   [place] [address] [phone]       Follow up with Darlin Coco, MD In 3 days. (Call to check when to restart & follow testing of your warfain anticoagulation)    Specialty:  Cardiology   Contact information:   [address] [place] [address] [phone]  Signed: Adin Hector 01/04/2014, 7:30 AM

## 2014-01-04 NOTE — Progress Notes (Signed)
Occupational Therapy Treatment Patient Details Name: Yolanda Spears MRN: [id number] DOB: [birth year] Today's Date: 01/04/2014    History of present illness Pt is a 79 year old female admitted due to recurrent bowel obstructions with possible ileal thickening from prior radiation enteritis and s/p SBO resection.  Pt with PMHx of stroke, recurrent SBO, breast cancer, endometrial cancer s/p radiation, arthritis, pacemaker and peripheral neuropathy   OT comments  Pt awaiting discharge to SNF today. Pt able to demonstrate use of AE for LB ADL and improvement in toilet transfers.    Follow Up Recommendations  SNF    Equipment Recommendations       Recommendations for Other Services      Precautions / Restrictions Precautions Precautions: Fall Precaution Comments: abdominal binder       Mobility Bed Mobility Overal bed mobility: Needs Assistance Bed Mobility: Supine to Sit;Sit to Supine     Supine to sit: Min assist Sit to supine: Min assist   General bed mobility comments: increased time and use of rail  Transfers Overall transfer level: Needs assistance   Transfers: Sit to/from Stand Sit to Stand: Min guard;Min assist (min from bed, min guard from bsc)              Balance                                   ADL Overall ADL's : Needs assistance/impaired     Grooming: Wash/dry hands;Min guard;Standing                   Toilet Transfer: Min guard;Stand-pivot;BSC (pt with diarrhea, used BSC)   Toileting- Clothing Manipulation and Hygiene: Maximal assistance;Sit to/from stand (educated in availability of toilet aide)         General ADL Comments: Pt has purchased AE in gift shop. Practiced use of reacher and sock aide.       Vision                     Perception     Praxis      Cognition   Behavior During Therapy: WFL for tasks assessed/performed Overall Cognitive Status: Within Functional Limits for tasks assessed                       Extremity/Trunk Assessment               Exercises     Shoulder Instructions       General Comments      Pertinent Vitals/ Pain       VSS, soreness is in abdomen with bed mobility  Home Living                                          Prior Functioning/Environment              Frequency Min 2X/week     Progress Toward Goals  OT Goals(current goals can now be found in the care plan section)  Progress towards OT goals: Progressing toward goals     Plan Discharge plan remains appropriate    Co-evaluation                 End of Session     Activity Tolerance Patient tolerated treatment well  Patient Left in bed;with call bell/phone within reach;with nursing/sitter in room   Nurse Communication          Time: [phone] OT Time Calculation (min): 30 min  Charges: OT General Charges $OT Visit: 1 Procedure OT Treatments $Self Care/Home Management : 23-37 mins  Haze Boyden Skyelyn Scruggs 01/04/2014, 1:05 PM [phone]

## 2014-01-04 NOTE — Discharge Instructions (Signed)
ABDOMINAL SURGERY: POST OP INSTRUCTIONS  1. DIET: Follow a light bland diet the first 24 hours after arrival home, such as soup, liquids, crackers, etc.  Be sure to include lots of fluids daily.  Avoid fast food or heavy meals as your are more likely to get nauseated.  Eat a low fat the next few days after surgery.   2. Take your usually prescribed home medications unless otherwise directed. 3. PAIN CONTROL: a. Pain is best controlled by a usual combination of three different methods TOGETHER: i. Ice/Heat ii. Over the counter pain medication iii. Prescription pain medication b. Most patients will experience some swelling and bruising around the incisions.  Ice packs or heating pads (30-60 minutes up to 6 times a day) will help. Use ice for the first few days to help decrease swelling and bruising, then switch to heat to help relax tight/sore spots and speed recovery.  Some people prefer to use ice alone, heat alone, alternating between ice & heat.  Experiment to what works for you.  Swelling and bruising can take several weeks to resolve.   c. It is helpful to take an over-the-counter pain medication regularly for the first few weeks.  Choose one of the following that works best for you: i. Naproxen (Aleve, etc)  Two 220mg  tabs twice a day ii. Ibuprofen (Advil, etc) Three 200mg  tabs four times a day (every meal & bedtime) iii. Acetaminophen (Tylenol, etc) 500-650mg  four times a day (every meal & bedtime) d. A  prescription for pain medication (such as oxycodone, hydrocodone, etc) should be given to you upon discharge.  Take your pain medication as prescribed.  i. If you are having problems/concerns with the prescription medicine (does not control pain, nausea, vomiting, rash, itching, etc), please call us [phone] to see if we need to switch you to a different pain medicine that will work better for you and/or control your side effect better. ii. If you need a refill on your pain medication,  please contact your pharmacy.  They will contact our office to request authorization. Prescriptions will not be filled after 5 pm or on week-ends. 4. Avoid getting constipated.  Between the surgery and the pain medications, it is common to experience some constipation.  Increasing fluid intake and taking a fiber supplement (such as Metamucil, Citrucel, FiberCon, MiraLax, etc) 1-2 times a day regularly will usually help prevent this problem from occurring.  A mild laxative (prune juice, Milk of Magnesia, MiraLax, etc) should be taken according to package directions if there are no bowel movements after 48 hours.   5. Watch out for diarrhea.  If you have many loose bowel movements, simplify your diet to bland foods & liquids for a few days.  Stop any stool softeners and decrease your fiber supplement.  Switching to mild anti-diarrheal medications (Kayopectate, Pepto Bismol) can help.  If this worsens or does not improve, please call us. 6. Wash / shower every day.  You may shower over the incision / wound.  Avoid baths until the skin is fully healed.  Continue to shower over incision(s) after the dressing is off. 7. Remove your waterproof bandages 5 days after surgery.  You may leave the incision open to air.  You may replace a dressing/Band-Aid to cover the incision for comfort if you wish. 8. ACTIVITIES as tolerated:   a. You may resume regular (light) daily activities beginning the next day--such as daily self-care, walking, climbing stairs--gradually increasing activities as tolerated.  If you can  walk 30 minutes without difficulty, it is safe to try more intense activity such as jogging, treadmill, bicycling, low-impact aerobics, swimming, etc. b. Save the most intensive and strenuous activity for last such as sit-ups, heavy lifting, contact sports, etc  Refrain from any heavy lifting or straining until you are off narcotics for pain control.   c. DO NOT PUSH THROUGH PAIN.  Let pain be your guide: If it  hurts to do something, don't do it.  Pain is your body warning you to avoid that activity for another week until the pain goes down. d. You may drive when you are no longer taking prescription pain medication, you can comfortably wear a seatbelt, and you can safely maneuver your car and apply brakes. e. Dennis Bast may have sexual intercourse when it is comfortable.  9. FOLLOW UP in our office a. Please call [facility] at [phone] to set up an appointment to see your surgeon in the office for a follow-up appointment approximately 1-2 weeks after your surgery. b. Make sure that you call for this appointment the day you arrive home to insure a convenient appointment time. 10. IF YOU HAVE DISABILITY OR FAMILY LEAVE FORMS, BRING THEM TO THE OFFICE FOR PROCESSING.  DO NOT GIVE THEM TO YOUR DOCTOR.   WHEN TO CALL us [phone]: 1. Poor pain control 2. Reactions / problems with new medications (rash/itching, nausea, etc)  3. Fever over 101.5 F (38.5 C) 4. Inability to urinate 5. Nausea and/or vomiting 6. Worsening swelling or bruising 7. Continued bleeding from incision. 8. Increased pain, redness, or drainage from the incision  The clinic staff is available to answer your questions during regular business hours (8:30am-5pm).  Please dont hesitate to call and ask to speak to one of our nurses for clinical concerns.   A surgeon from [facility] is always on call at the hospitals   If you have a medical emergency, go to the nearest emergency room or call 911.    [facility], [address], [place], [place], [place]  [address] ? MAIN: [phone] ? TOLL FREE: [phone] ? FAX [phone] [email or web address]   GETTING TO GOOD BOWEL HEALTH. Irregular bowel habits such as constipation and diarrhea can lead to many problems over time.  Having one soft bowel movement a day is the most important way to prevent further problems.  The anorectal canal  is designed to handle stretching and feces to safely manage our ability to get rid of solid waste (feces, poop, stool) out of our body.  BUT, hard constipated stools can act like ripping concrete bricks and diarrhea can be a burning fire to this very sensitive area of our body, causing inflamed hemorrhoids, anal fissures, increasing risk is perirectal abscesses, abdominal pain/bloating, an making irritable bowel worse.     The goal: ONE SOFT BOWEL MOVEMENT A DAY!  To have soft, regular bowel movements:    Drink at least 8 tall glasses of water a day.     Take plenty of fiber.  Fiber is the undigested part of plant food that passes into the colon, acting s natures broom to encourage bowel motility and movement.  Fiber can absorb and hold large amounts of water. This results in a larger, bulkier stool, which is soft and easier to pass. Work gradually over several weeks up to 6 servings a day of fiber (25g a day even more if needed) in the form of: o Vegetables -- Root (potatoes, carrots,  turnips), leafy green (lettuce, salad greens, celery, spinach), or cooked high residue (cabbage, broccoli, etc) o Fruit -- Fresh (unpeeled skin & pulp), Dried (prunes, apricots, cherries, etc ),  or stewed ( applesauce)  o Whole grain breads, pasta, etc (whole wheat)  o Bran cereals    Bulking Agents -- This type of water-retaining fiber generally is easily obtained each day by one of the following:  o Psyllium bran -- The psyllium plant is remarkable because its ground seeds can retain so much water. This product is available as Metamucil, Konsyl, Effersyllium, Per Diem Fiber, or the less expensive generic preparation in drug and health food stores. Although labeled a laxative, it really is not a laxative.  o Methylcellulose -- This is another fiber derived from wood which also retains water. It is available as Citrucel. o Polyethylene Glycol - and artificial fiber commonly called Miralax or Glycolax.  It is helpful  for people with gassy or bloated feelings with regular fiber o Flax Seed - a less gassy fiber than psyllium   No reading or other relaxing activity while on the toilet. If bowel movements take longer than 5 minutes, you are too constipated   AVOID CONSTIPATION.  High fiber and water intake usually takes care of this.  Sometimes a laxative is needed to stimulate more frequent bowel movements, but    Laxatives are not a good long-term solution as it can wear the colon out. o Osmotics (Milk of Magnesia, Fleets phosphosoda, Magnesium citrate, MiraLax, GoLytely) are safer than  o Stimulants (Senokot, Castor Oil, Dulcolax, Ex Lax)    o Do not take laxatives for more than 7days in a row.    IF SEVERELY CONSTIPATED, try a Bowel Retraining Program: o Do not use laxatives.  o Eat a diet high in roughage, such as bran cereals and leafy vegetables.  o Drink six (6) ounces of prune or apricot juice each morning.  o Eat two (2) large servings of stewed fruit each day.  o Take one (1) heaping tablespoon of a psyllium-based bulking agent twice a day. Use sugar-free sweetener when possible to avoid excessive calories.  o Eat a normal breakfast.  o Set aside 15 minutes after breakfast to sit on the toilet, but do not strain to have a bowel movement.  o If you do not have a bowel movement by the third day, use an enema and repeat the above steps.    Controlling diarrhea o Switch to liquids and simpler foods for a few days to avoid stressing your intestines further. o Avoid dairy products (especially milk & ice cream) for a short time.  The intestines often can lose the ability to digest lactose when stressed. o Avoid foods that cause gassiness or bloating.  Typical foods include beans and other legumes, cabbage, broccoli, and dairy foods.  Every person has some sensitivity to other foods, so listen to our body and avoid those foods that trigger problems for you. o Adding fiber (Citrucel, Metamucil, psyllium,  Miralax) gradually can help thicken stools by absorbing excess fluid and retrain the intestines to act more normally.  Slowly increase the dose over a few weeks.  Too much fiber too soon can backfire and cause cramping & bloating. o Probiotics (such as active yogurt, Align, etc) may help repopulate the intestines and colon with normal bacteria and calm down a sensitive digestive tract.  Most studies show it to be of mild help, though, and such products can be costly. o Medicines:  Bismuth subsalicylate (ex. Kayopectate, Pepto Bismol) every 30 minutes for up to 6 doses can help control diarrhea.  Avoid if pregnant.   Loperamide (Immodium) can slow down diarrhea.  Start with two tablets (4mg  total) first and then try one tablet every 6 hours.  Avoid if you are having fevers or severe pain.  If you are not better or start feeling worse, stop all medicines and call your doctor for advice o Call your doctor if you are getting worse or not better.  Sometimes further testing (cultures, endoscopy, X-ray studies, bloodwork, etc) may be needed to help diagnose and treat the cause of the diarrhea.  Managing Pain  Pain after surgery or related to activity is often due to strain/injury to muscle, tendon, nerves and/or incisions.  This pain is usually short-term and will improve in a few months.   Many people find it helpful to do the following things TOGETHER to help speed the process of healing and to get back to regular activity more quickly:  1. Avoid heavy physical activity a.  no lifting greater than 20 pounds b. Do not push through the pain.  Listen to your body and avoid positions and maneuvers than reproduce the pain c. Walking is okay as tolerated, but go slowly and stop when getting sore.  d. Remember: If it hurts to do it, then dont do it! 2. Take Anti-inflammatory medication  a. Take with food/snack around the clock for 1-2 weeks i. This helps the muscle and nerve tissues become less irritable  and calm down faster ii. Choose Acetaminophen 500mg  tabs (Tylenol) 1-2 pills with every meal and just before bedtime 3. Use a Heating pad or Ice/Cold Pack a. 4-6 times a day b. May use warm bath/hottub  or showers 4. Try Gentle Massage and/or Stretching  a. at the area of pain many times a day b. stop if you feel pain - do not overdo it  Try these steps together to help you body heal faster and avoid making things get worse.  Doing just one of these things may not be enough.    If you are not getting better after two weeks or are noticing you are getting worse, contact our office for further advice; we may need to re-evaluate you & see what other things we can do to help.  Warfarin: What You Need to Know Warfarin is an anticoagulant. Anticoagulants help prevent the formation of blood clots. They also help stop the growth of blood clots. Warfarin is sometimes referred to as a "blood thinner."  Normally, when body tissues are cut or damaged, the blood clots in order to prevent blood loss. Sometimes clots form inside your blood vessels and obstruct the flow of blood through your circulatory system (thrombosis). These clots may travel through your bloodstream and become lodged in smaller blood vessels in your brain, which can cause a stroke, or your lungs (pulmonary embolism). WHO SHOULD USE WARFARIN? Warfarin is prescribed for people at risk of developing harmful blood clots:  People with surgically implanted mechanical heart valves, irregular heart rhythms called atrial fibrillation, and certain clotting disorders.  People who have developed harmful blood clotting in the past, including those who have had a stroke or a pulmonary embolism, or thrombosis in their legs (deep vein thrombosis [DVT]).  People with an existing blood clot such as a pulmonary embolism. WARFARIN DOSING Warfarin tablets come in different strengths. Each tablet strength is a different color, with the amount of warfarin  (in milligrams)  clearly printed on the tablet. If the color of your tablet is different than usual when you receive a new prescription, report it immediately to your pharmacist or health care provider. WARFARIN MONITORING The goal of warfarin therapy is to lessen the clotting tendency of blood but not to prevent clotting completely. Your health care provider will monitor the anticoagulation effect of warfarin closely and adjust your dose as needed. For your safety, blood tests called prothrombin time (PT) or international normalized ratio (INR) are used to measure the effects of warfarin. Both of these tests can be done with a finger stick or a blood draw. The longer it takes the blood to clot, the higher the PT or INR. Your health care provider will inform you of your "target" PT or INR range. If, at any time, your PT or INR is above the target range, there is a risk of bleeding. If your PT or INR is below the target range, there is a risk of clotting. Whether you are started on warfarin while you are in the hospital, or in your health care provider's office, you will need to have your PT or INR checked within one week of starting the medicine. Initially, some people are asked to have their PT or INR checked as much as twice a week. Once you are on a stable maintenance dose, the PT or INR is checked less often, usually once every 2 to 4 weeks. The warfarin dose may be adjusted if the PT or INR is not within the target range. It is important to keep all laboratory and health care provider follow-up appointments.  WHAT ARE THE SIDE EFFECTS OF WARFARIN?  Too much warfarin can cause bleeding (hemorrhage) from any part of the body. This may include bleeding from the gums, blood in the urine, bloody or dark stools, a nosebleed that is not easily stopped, coughing up blood, or vomiting blood.  Too little warfarin can increase the risk of blood clots.  Too little or too much warfarin can also increase the risk  of a stroke.  Warfarin use may cause a skin rash or irritation, an unusual fever, continual nausea or stomach upset, or severe pain in your joints or back. SPECIAL PRECAUTIONS WHILE TAKING WARFARIN Warfarin should be taken exactly as directed:  Take your medicine at the same time every day. If you forget to take your dose, you can take it if it is within 6 hours of when it was due.  Do not change the dose of warfarin on your own to make up for missed or extra doses.  If you miss more than 2 doses in a row, you should contact your health care provider for advice. Avoid situations that cause bleeding. You may have a tendency to bleed more easily than usual while taking warfarin. The following actions can limit bleeding:  Using a softer toothbrush.  Flossing with waxed floss rather than unwaxed floss.  Shaving with an Copy rather than a blade.  Limiting the use of sharp objects.  Avoiding potentially harmful activities such as contact sports. Warfarin and Pregnancy or Breastfeeding  Warfarin is not advised during the first trimester of pregnancy due to an increased risk of birth defects. In certain situations, a woman may take warfarin after her first trimester of pregnancy. A woman who becomes pregnant or plans to become pregnant while taking warfarin should notify her health care provider immediately.  Although warfarin does not pass into breast milk, a woman who wishes to  breastfeed while taking warfarin should also consult with her health care provider. Alcohol, Smoking, and Illicit Drug Use  Alcohol affects how warfarin works in the body. It is best to avoid alcoholic drinks or consume very small amounts while taking warfarin. In general, alcohol intake should be limited to 1 oz (30 mL) of liquor, 6 oz (180 mL) of wine, or 12 oz (360 mL) of beer each day. Notify your health care provider if you change your alcohol intake.  Smoking affects how warfarin works. It is best to  avoid smoking while taking warfarin. Notify your health care provider if you change your smoking habits.  It is best to avoid all illicit drugs while taking warfarin since there are few studies that show how warfarin interacts with these drugs. Other Medicines and Dietary Supplements Many prescription and over-the-counter medicines can interfere with warfarin. Be sure all of your health care providers know you are taking warfarin. Notify your health care provider who prescribed warfarin for you before starting or stopping any new medicines, including over-the-counter vitamins, dietary supplements, and pain medicines. Your warfarin dose may need to be adjusted. Some common over-the-counter medicines that may increase the risk of bleeding while taking warfarin include:   Acetaminophen.  Aspirin.  Nonsteroidal anti-inflammatory medicines such as ibuprofen or naproxen.  Vitamin E. Dietary Considerations  Foods that have moderate or high amounts of vitamin K can interfere with warfarin. Avoid major changes in your diet or notify your health care provider before changing your diet. Eat a consistent amount of foods that have moderate or high amounts of vitamin K.Eating less foods containing vitamin K can increase the risk of bleeding. Eating more foods containing vitamin K can increase the risk of blood clots. Additional questions about dietary considerations can be discussed with a dietitian. The serving size for foods containing moderate or high amounts of vitamin K are  cup cooked (120 mL or noted gram weight) or 1 cup raw (240 mL or noted gram weight), unless otherwise noted. These foods include: Proteins  Beef liver, 3.5 oz (100 g).  Pork liver, 3.5 oz (100 g). Legumes  Soybean oil.  Soybeans.  Garbanzo beans.  Green peas.  Black-eyed peas. Leafy green vegetables  Kale.  Spinach.  Nettle greens.  Swiss chard.  Watercress.  Endive.  Parsley, 1 tbsp (4 g).  Turnip  greens.  Collard greens.  Seaweed, limit 2 sheets.  Beet greens.  Dandelion greens.  Mustard greens.  Green Lead and Romaine lettuce. Cruciferous vegetables  Broccoli.  Cabbage (green or [place]).  Brussels sprouts.  Cauliflower.  Asparagus. Miscellaneous  Onions, green onions, or spring onions.  Green tea made with  oz (14 g) or more of dried tea.  Herbal teas containing coumarin.  Spinach noodles.  Okra.  Prunes.  Angie Fava. CALL YOUR CLINIC OR HEALTH CARE PROVIDER IF YOU:  Plan to have any surgery or procedure.  Feel sick, especially if you have diarrhea or vomiting.  Experience or anticipate any major changes in your diet.  Start or stop a prescription or over-the-counter medicine.  Become, plan to become, or think you may be pregnant.  Are having heavier than usual menstrual periods.  Have had a fall, accident, or any symptoms of bleeding or unusual bruising.  An unusual fever. CALL 911 IN THE [place] OR GO TO THE EMERGENCY DEPARTMENT IF YOU:   Think you may be having an allergic reaction to warfarin. The signs of an allergic reaction could include itching, rash, hives, swelling,  chest tightness, or trouble breathing.  See signs of blood in your urine. The signs could include reddish, pinkish, or tea-colored urine.  See signs of blood in your stools. The signs could include bright red or black stools.  Vomit or cough up blood. In these instances, the blood could have either a bright red or a "coffee-grounds" appearance.  Have bleeding that will not stop after applying pressure for 30 minutes such as cuts, nosebleeds, other injuries.  Have severe pain in your joints or back.  Have a new and severe headache.  Have sudden weakness or numbness of your face, arm, or leg, especially on one side of your body.  Have sudden confusion or trouble understanding.  Have sudden trouble seeing in one or both eyes.  Have sudden trouble walking, dizziness,  loss of balance, or coordination.  Have aphasia. Document Released: 08/24/2005 Document Revised: 05/18/2012 Document Reviewed: 02/17/2013 [facility] Patient Information 2014 [place].  HEMORRHOIDS  The rectum is the last foot of your colon, and it naturally stretches to hold stool.  Hemorrhoidal piles are natural clusters of blood vessels that help the rectum and anal canal stretch to hold stool and allow bowel movements to eliminate feces.   Hemorrhoids are abnormally swollen blood vessels in the rectum.  Too much pressure in the rectum causes hemorrhoids by forcing blood to stretch and bulge the walls of the veins, sometimes even rupturing them.  Hemorrhoids can become like varicose veins you might see on a person's legs.  Most people will develop a flare of hemorrhoids in their lifetime.  When bulging hemorrhoidal veins are irritated, they can swell, burn, itch, cause pain, and bleed.  Most flares will calm down gradually own within a few weeks.  However, once hemorrhoids are created, they are difficult to get rid of completely and tend to flare more easily than the first flare.   Fortunately, good habits and simple medical treatment usually control hemorrhoids well, and surgery is needed only in severe cases. Types of Hemorrhoids:  Internal hemorrhoids usually don't initially hurt or itch; they are deep inside the rectum and usually have no sensation. If they begin to push out (prolapse), pain and burning can occur.  However, internal hemorrhoids can bleed.  Anal bleeding should not be ignored since bleeding could come from a dangerous source like colorectal cancer, so persistent rectal bleeding should be investigated by a doctor, sometimes with a colonoscopy.  External hemorrhoids cause most of the symptoms - pain, burning, and itching. Nonirritated hemorrhoids can look like small skin tags coming out of the anus.   Thrombosed hemorrhoids can form when a hemorrhoid blood vessel bursts and  causes the hemorrhoid to suddenly swell.  A purple blood clot can form in it and become an excruciatingly painful lump at the anus. Because of these unpleasant symptoms, immediate incision and drainage by a surgeon at an office visit can provide much relief of the pain.    PREVENTION Avoiding the most frequent causes listed below will prevent most cases of hemorrhoids: Constipation Hard stools Diarrhea  Constant sitting  Straining with bowel movements Sitting on the toilet for a long time  Severe coughing  episodes Pregnancy / Childbirth  Heavy Lifting  Sometimes avoiding the above triggers is difficult:  How can you avoid sitting all day if you have a seated job? Also, we try to avoid coughing and diarrhea, but sometimes its beyond your control.  Still, there are some practical hints to help: Keep the anal and genital  area clean.  Moistened tissues such as flushable wet wipes are less irritating than toilet paper.  Using irrigating showers or bottle irrigation washing gently cleans this sensitive area.   Avoid dry toilet paper when cleaning after bowel movements.  Marland Kitchen Keep the anal and genital area dry.  Lightly pat the rectal area dry.  Avoid rubbing.  Talcum or baby powders can help GET YOUR STOOLS SOFT.   This is the most important way to prevent irritated hemorrhoids.  Hard stools are like sandpaper to the anorectal canal and will cause more problems.  The goal: ONE SOFT BOWEL MOVEMENT A DAY!  BMs from every other day to 3 times a day is a tolerable range Treat coughing, diarrhea and constipation early since irritated hemorrhoids may soon follow.  If your main job activity is seated, always stand or walk during your breaks. Make it a point to stand and walk at least 5 minutes every hour and try to shift frequently in your chair to avoid direct rectal pressure.  Always exhale as you strain or lift. Don't hold your breath.  Do not delay or try to prevent a bowel movement when the urge is  present. Exercise regularly (walking or jogging 60 minutes a day) to stimulate the bowels to move. No reading or other activity while on the toilet. If bowel movements take longer than 5 minutes, you are too constipated. AVOID CONSTIPATION Drink plenty of liquids (1 1/2 to 2 quarts of water and other fluids a day unless fluid restricted for another medical condition). Liquids that contain caffeine (coffee a, tea, soft drinks) can be dehydrating and should be avoided until constipation is controlled. Consider minimizing milk, as dairy products may be constipating. Eat plenty of fiber (30g a day ideal, more if needed).  Fiber is the undigested part of plant food that passes into the colon, acting as natures broom to encourage bowel motility and movement.  Fiber can absorb and hold large amounts of water. This results in a larger, bulkier stool, which is soft and easier to pass.  Eating foods high in fiber - 12 servings - such as  Vegetables: Root (potatoes, carrots, turnips), Leafy green (lettuce, salad greens, celery, spinach), High residue (cabbage, broccoli, etc.) Fruit: Fresh, Dried (prunes, apricots, cherries), Stewed (applesauce)  Whole grain breads, pasta, whole wheat Bran cereals, muffins, etc. Consider adding supplemental bulking fiber which retains large volumes of water: Psyllium ground seeds (native plant from [place])--available as Metamucil, Konsyl, Effersyllium, Per Diem Fiber, or the less expensive generic forms.  Citrucel  (methylcellulose wood fiber) . FiberCon (Polycarbophil) Polyethylene Glycol - and artificial fiber commonly called Miralax or Glycolax.  It is helpful for people with gassy or bloated feelings with regular fiber Flax Seed - a less gassy natural fiber  Laxatives can be useful for a short period if constipation is severe Osmotics (Milk of Magnesia, Fleets Phospho-Soda, Magnesium Citrate)  Stimulants (Senokot,   Castor Oil,  Dulcolax, Ex-Lax)    Laxatives  are not a good long-term solution as it can stress the bowels and cause too much mineral loss and dehydration.   Avoid taking laxatives for more than 7 days in a row.  AVOID DIARRHEA Switch to liquids and simpler foods for a few days to avoid stressing your intestines further. Avoid dairy products (especially milk & ice cream) for a short time.  The intestines often can lose the ability to digest lactose when stressed. Avoid foods that cause gassiness or bloating.  Typical foods include  beans and other legumes, cabbage, broccoli, and dairy foods.  Every person has some sensitivity to other foods, so listen to your body and avoid those foods that trigger problems for you. Adding fiber (Citrucel, Metamucil, FiberCon, Flax seed, Miralax) gradually can help thicken stools by absorbing excess fluid and retrain the intestines to act more normally.  Slowly increase the dose over a few weeks.  Too much fiber too soon can backfire and cause cramping & bloating. Probiotics (such as active yogurt, Align, etc) may help repopulate the intestines and colon with normal bacteria and calm down a sensitive digestive tract.  Most studies show it to be of mild help, though, and such products can be costly. Medicines: Bismuth subsalicylate (ex. Kayopectate, Pepto Bismol) every 30 minutes for up to 6 doses can help control diarrhea.  Avoid if pregnant. Loperamide (Immodium) can slow down diarrhea.  Start with two tablets (4mg  total) first and then try one tablet every 6 hours.  Avoid if you are having fevers or severe pain.  If you are not better or start feeling worse, stop all medicines and call your doctor for advice Call your doctor if you are getting worse or not better.  Sometimes further testing (cultures, endoscopy, X-ray studies, bloodwork, etc) may be needed to help diagnose and treat the cause of the diarrhea. TREATMENT OF HEMORRHOID FLARE If these preventive measures fail, you must take action right away!  Hemorrhoids are one condition that can be mild in the morning and become intolerable by nightfall. Most hemorrhoidal flares take several weeks to calm down.  These suggestions can help: Warm soaks.  This helps more than any topical medication.  Use up to 8 times a day.  Usually sitz baths or sitting in a warm bathtub helps.  Sitting on moist warm towels are helpful.  Switching to ice packs/cool compresses can be helpful  Use a Sitz Bath 4-8 times a day for relief A sitz bath is a warm water bath taken in the sitting position that covers only the hips and buttocks. It may be used for either healing or hygiene purposes. Sitz baths are also used to relieve pain, itching, or muscle spasms. The water may contain medicine. Moist heat will help you heal and relax.  HOME CARE INSTRUCTIONS  Take 3 to 4 sitz baths a day. 1. Fill the bathtub half full with warm water. 2. Sit in the water and open the drain a little. 3. Turn on the warm water to keep the tub half full. Keep the water running constantly. 4. Soak in the water for 15 to 20 minutes. 5. After the sitz bath, pat the affected area dry first. SEEK MEDICAL CARE IF:  You get worse instead of better. Stop the sitz baths if you get worse.  Normalize your bowels.  Extremes of diarrhea or constipation will make hemorrhoids worse.  One soft bowel movement a day is the goal.  Fiber can help get your bowels regular Wet wipes instead of toilet paper Pain control with a NSAID such as ibuprofen (Advil) or naproxen (Aleve) or acetaminophen (Tylenol) around the clock.  Narcotics are constipating and should be minimized if possible Topical creams contain steroids (bydrocortisone) or local anesthetic (xylocaine) can help make pain and itching more tolerable.   EVALUATION If hemorrhoids are still causing problems, you could benefit by an evaluation by a surgeon.  The surgeon will obtain a history and examine you.  If hemorrhoids are diagnosed, some therapies can be  offered in the  office, usually with an anoscope into the less sensitive area of the rectum: -injection of hemorrhoids (sclerotherapy) can scar the blood vessels of the swollen/enlarged hemorrhoids to help shrink them down to a more normal size -rubber banding of the enlarged hemorrhoids to help shrink them down to a more normal size -drainage of the blood clot causing a thrombosed hemorrhoid,  to relieve the severe pain   While 90% of the time such problems from hemorrhoids can be managed without preceding to surgery, sometimes the hemorrhoids require a operation to control the problem (uncontrolled bleeding, prolapse, pain, etc.).   This involves being placed under general anesthesia where the surgeon can confirm the diagnosis and remove, suture, or staple the hemorrhoid(s).  Your surgeon can help you treat the problem appropriately.     Diarrhea Diarrhea is frequent loose and watery bowel movements. It can cause you to feel weak and dehydrated. Dehydration can cause you to become tired and thirsty, have a dry mouth, and have decreased urination that often is dark yellow. Diarrhea is a sign of another problem, most often an infection that will not last long. In most cases, diarrhea typically lasts 2 3 days. However, it can last longer if it is a sign of something more serious. It is important to treat your diarrhea as directed by your caregive to lessen or prevent future episodes of diarrhea. CAUSES  Some common causes include:  Gastrointestinal infections caused by viruses, bacteria, or parasites.  Food poisoning or food allergies.  Certain medicines, such as antibiotics, chemotherapy, and laxatives.  Artificial sweeteners and fructose.  Digestive disorders. HOME CARE INSTRUCTIONS  Ensure adequate fluid intake (hydration): have 1 cup (8 oz) of fluid for each diarrhea episode. Avoid fluids that contain simple sugars or sports drinks, fruit juices, whole milk products, and sodas. Your urine  should be clear or pale yellow if you are drinking enough fluids. Hydrate with an oral rehydration solution that you can purchase at pharmacies, retail stores, and online. You can prepare an oral rehydration solution at home by mixing the following ingredients together:    tsp table salt.   tsp baking soda.   tsp salt substitute containing potassium chloride.  1  tablespoons sugar.  1 L (34 oz) of water.  Certain foods and beverages may increase the speed at which food moves through the gastrointestinal (GI) tract. These foods and beverages should be avoided and include:  Caffeinated and alcoholic beverages.  High-fiber foods, such as raw fruits and vegetables, nuts, seeds, and whole grain breads and cereals.  Foods and beverages sweetened with sugar alcohols, such as xylitol, sorbitol, and mannitol.  Some foods may be well tolerated and may help thicken stool including:  Starchy foods, such as rice, toast, pasta, low-sugar cereal, oatmeal, grits, baked potatoes, crackers, and bagels.  Bananas.  Applesauce.  Add probiotic-rich foods to help increase healthy bacteria in the GI tract, such as yogurt and fermented milk products.  Wash your hands well after each diarrhea episode.  Only take over-the-counter or prescription medicines as directed by your caregiver.  Take a warm bath to relieve any burning or pain from frequent diarrhea episodes. SEEK IMMEDIATE MEDICAL CARE IF:   You are unable to keep fluids down.  You have persistent vomiting.  You have blood in your stool, or your stools are black and tarry.  You do not urinate in 6 8 hours, or there is only a small amount of very dark urine.  You have abdominal pain that increases  or localizes.  You have weakness, dizziness, confusion, or lightheadedness.  You have a severe headache.  Your diarrhea gets worse or does not get better.  You have a fever or persistent symptoms for more than 2 3 days.  You have a fever  and your symptoms suddenly get worse. MAKE SURE YOU:   Understand these instructions.  Will watch your condition.  Will get help right away if you are not doing well or get worse. Document Released: 08/14/2002 Document Revised: 08/10/2012 Document Reviewed: 05/01/2012 [facility] Patient Information 2014 [place], [place].

## 2014-01-04 NOTE — Progress Notes (Signed)
Clinical Social Work Department CLINICAL SOCIAL WORK PLACEMENT NOTE 01/04/2014  Patient:  Yolanda Spears, Yolanda Spears  Account Number:  [id number] Admit date:  12/28/2013  Clinical Social Worker:  Levie Heritage  Date/time:  12/30/2013 03:05 PM  Clinical Social Work is seeking post-discharge placement for this patient at the following level of care:   SKILLED NURSING   (*CSW will update this form in Epic as items are completed)   12/30/2013  Patient/family provided with [facility] Department of Clinical Social Work's list of facilities offering this level of care within the geographic area requested by the patient (or if unable, by the patient's family).  12/30/2013  Patient/family informed of their freedom to choose among providers that offer the needed level of care, that participate in Medicare, Medicaid or managed care program needed by the patient, have an available bed and are willing to accept the patient.  12/30/2013  Patient/family informed of [facility]' ownership interest in [facility], as well as of the fact that they are under no obligation to receive care at this facility.  PASARR submitted to EDS on 12/30/2013 PASARR number received from [facility] on 12/30/2013  FL2 transmitted to all facilities in geographic area requested by pt/family on  12/30/2013 FL2 transmitted to all facilities within larger geographic area on   Patient informed that his/her managed care company has contracts with or will negotiate with  certain facilities, including the following:     Patient/family informed of bed offers received:  01/01/2014 Patient chooses bed at [facility] Physician recommends and patient chooses bed at    Patient to be transferred to [facility] on  01/04/2014 Patient to be transferred to facility by [facility]  The following physician request were entered in Epic:   Additional Comments:  Werner Lean LCSW  [phone]

## 2014-01-04 NOTE — Progress Notes (Signed)
ANTICOAGULATION CONSULT NOTE - Follow up  Pharmacy Consult for Warfarin Indication: atrial fibrillation  Allergies  Allergen Reactions  . Other Hives    Contrast dye   . Penicillins Itching  . Shellfish-Derived Products Nausea And Vomiting  . Statins     Myalgias,elevated LFT studies  . Iohexol Hives and Itching    Itching in eyes, one hive on face.   Patient Measurements: Height: 5\' 7"  (170.2 cm) Weight: 204 lb 9.4 oz (92.8 kg) IBW/kg (Calculated) : 61.6  Vital Signs: Temp: 98.2 F (36.8 C) (04/30 0608) Temp src: Oral (04/30 0608) BP: 137/81 mmHg (04/30 0608) Pulse Rate: 72 (04/30 0608)  Labs:  Recent Labs  01/02/14 0345 01/03/14 0410 01/04/14 0400  HGB 8.8*  --   --   HCT 27.6*  --   --   PLT 225  --   --   LABPROT 18.9* 30.5* 37.8*  INR 1.63* 3.06* 4.05*  CREATININE 0.90  --   --    Medications:  Scheduled:  . acetaminophen  1,000 mg Oral TID  . aspirin  81 mg Oral Daily  . calcium carbonate  1 tablet Oral Daily  . ezetimibe  5 mg Oral QPM  . ferrous sulfate  325 mg Oral BID WC  . hydrALAZINE  25 mg Oral BID  . hydrocortisone   Rectal TID  . lactose free nutrition  237 mL Oral TID WC  . lip balm  1 application Topical BID  . lisinopril  10 mg Oral BID  . metoprolol tartrate  25 mg Oral BID  . pregabalin  75 mg Oral BID  . propafenone  225 mg Oral 3 times per day  . psyllium  1 packet Oral BID  . saccharomyces boulardii  250 mg Oral BID  . Warfarin - Pharmacist Dosing Inpatient   Does not apply q1800   Assessment: 79 YO presented with partial small bowel obstruction-recurrent-s/p lysis of adhesions on 4/23. Hx of AFib, chronic Warfarin 1.25mg  on MWF and 2.5mg  the rest of the week, last dose on 4/17. Admit INR 1.30.  INR 4.05, continue to increase quickly today after 4mg  x 2. Dose held yesterday.  SQ heparin stopped by MD.  Also on ASA 81mg  and Pepto(bismuth subsalicylate). No PPI or H2RA currently or PTA.  CBC was ok 4/28. No bleeding  reported/documented.  Regular diet, pretty good intake charted.  Goal of Therapy:  INR 2-3   Plan:  Hold warfarin again today due to high INR.  Discussed this with patient today and would recommend holding warfarin doses today and tomorrow and resuming home dosing on Saturday with close INR follow up.  Patient will be discharge today to [facility] for rehab and INR should be monitored there.  Hershal Coria, PharmD, BCPS Pager: [phone] 01/04/2014 10:33 AM

## 2014-01-08 ENCOUNTER — Telehealth (INDEPENDENT_AMBULATORY_CARE_PROVIDER_SITE_OTHER): Payer: Self-pay

## 2014-01-08 ENCOUNTER — Encounter: Payer: Medicare Other | Admitting: *Deleted

## 2014-01-08 NOTE — Telephone Encounter (Signed)
Pt calling in b/c she needed a f/u appt in 2 weeks for Dr Johney [place]. I made her a f/u appt with Dr Johney [place] for 01/18/14 before urgent office. The pt is also wanting something to slow her loose stools down. The pt is currently taking iron pills but even that is not slowing the BM's down. I advised  Pt that I would check with Dr Johney [place] to see if we could get an order for some Immodium. The pt is currently staying at [facility] for her recovery period. Please advise.

## 2014-01-08 NOTE — Telephone Encounter (Signed)
LMOM for pt notifying her that I did speak to pt's son and nurse Christy at [facility].

## 2014-01-08 NOTE — Telephone Encounter (Signed)
Alyse Low is the nurse taking care of the pt at [facility] today. I wanted Alyse Low to go over the med list b/c pt calling wanting to get something for her loose stools and Dr Johney [place] stated that he discharged pt with orders for Pepto Bismol. The pt said she was given nothing except iron pills. Alyse Low will get pt's chart and call me back to clarify orders by Dr Johney [place].

## 2014-01-08 NOTE — Telephone Encounter (Signed)
She has orders for pepto bismol PRN/BID - she should try that 1st.  If she is having >3 BMs a day on iron & pepto BID, then OK to add imodium QHS only & see how she does

## 2014-01-08 NOTE — Telephone Encounter (Signed)
Called Christy back at [facility] [employer]. I asked for her to check on the med list for the pt if there were orders for Pepto and there was not any orders. I asked to fax Dr Clyda Greener instructions with the Pepto and Immodium to [phone] attn Christy. I will call pt to let her know that I spoke to [place].

## 2014-01-18 ENCOUNTER — Encounter (INDEPENDENT_AMBULATORY_CARE_PROVIDER_SITE_OTHER): Payer: Self-pay | Admitting: Surgery

## 2014-01-18 ENCOUNTER — Ambulatory Visit (INDEPENDENT_AMBULATORY_CARE_PROVIDER_SITE_OTHER): Payer: Medicare Other | Admitting: Surgery

## 2014-01-18 VITALS — BP 132/70 | HR 83 | Temp 98.0°F | Ht 67.0 in | Wt 207.0 lb

## 2014-01-18 DIAGNOSIS — K56609 Unspecified intestinal obstruction, unspecified as to partial versus complete obstruction: Secondary | ICD-10-CM

## 2014-01-18 DIAGNOSIS — K6389 Other specified diseases of intestine: Secondary | ICD-10-CM

## 2014-01-18 DIAGNOSIS — K56600 Partial intestinal obstruction, unspecified as to cause: Secondary | ICD-10-CM

## 2014-01-18 DIAGNOSIS — I4891 Unspecified atrial fibrillation: Secondary | ICD-10-CM

## 2014-01-18 DIAGNOSIS — K639 Disease of intestine, unspecified: Secondary | ICD-10-CM

## 2014-01-18 LAB — CBC WITH DIFFERENTIAL/PLATELET
BASOS ABS: 0 10*3/uL (ref 0.0–0.1)
Basophils Relative: 0 % (ref 0–1)
Eosinophils Absolute: 0.1 10*3/uL (ref 0.0–0.7)
Eosinophils Relative: 3 % (ref 0–5)
HEMATOCRIT: 27.5 % — AB (ref 36.0–46.0)
HEMOGLOBIN: 8.8 g/dL — AB (ref 12.0–15.0)
LYMPHS PCT: 14 % (ref 12–46)
Lymphs Abs: 0.6 10*3/uL — ABNORMAL LOW (ref 0.7–4.0)
MCH: 28.3 pg (ref 26.0–34.0)
MCHC: 32 g/dL (ref 30.0–36.0)
MCV: 88.4 fL (ref 78.0–100.0)
MONO ABS: 0.6 10*3/uL (ref 0.1–1.0)
Monocytes Relative: 13 % — ABNORMAL HIGH (ref 3–12)
NEUTROS ABS: 3.1 10*3/uL (ref 1.7–7.7)
Neutrophils Relative %: 70 % (ref 43–77)
Platelets: 315 10*3/uL (ref 150–400)
RBC: 3.11 MIL/uL — ABNORMAL LOW (ref 3.87–5.11)
RDW: 16 % — ABNORMAL HIGH (ref 11.5–15.5)
WBC: 4.4 10*3/uL (ref 4.0–10.5)

## 2014-01-18 LAB — COMPREHENSIVE METABOLIC PANEL
ALBUMIN: 3 g/dL — AB (ref 3.5–5.2)
ALT: 18 U/L (ref 0–35)
AST: 17 U/L (ref 0–37)
Alkaline Phosphatase: 84 U/L (ref 39–117)
BUN: 10 mg/dL (ref 6–23)
CHLORIDE: 102 meq/L (ref 96–112)
CO2: 28 mEq/L (ref 19–32)
Calcium: 8.5 mg/dL (ref 8.4–10.5)
Creat: 1.04 mg/dL (ref 0.50–1.10)
Glucose, Bld: 90 mg/dL (ref 70–99)
Potassium: 3.8 mEq/L (ref 3.5–5.3)
SODIUM: 142 meq/L (ref 135–145)
TOTAL PROTEIN: 6.1 g/dL (ref 6.0–8.3)
Total Bilirubin: 0.7 mg/dL (ref 0.2–1.2)

## 2014-01-18 NOTE — Patient Instructions (Signed)
Please consider the recommendations that we have given you today:  We will get blood work today to make sure you are not anemic or dehydrated.  Please call us to get results.  Restart iron.  325 mg.  3 times a day.  Okay to do a Imodium 2 mg twice a day.  Can take additional 2mg  every 6 hours for diarrhea (>4BMs/day).  Start fiber supplement.  Metamucil.  One packet 2 times a day.  This should help thicken stools  Sent appointment to see her primary care physician concerning diuretics and other medications.  See the Handout(s) we have given you.  Please call our office at [phone] if you wish to schedule surgery or if you have further questions / concerns.   GETTING TO GOOD BOWEL HEALTH. Irregular bowel habits such as constipation and diarrhea can lead to many problems over time.  Having one soft bowel movement a day is the most important way to prevent further problems.  The anorectal canal is designed to handle stretching and feces to safely manage our ability to get rid of solid waste (feces, poop, stool) out of our body.  BUT, hard constipated stools can act like ripping concrete bricks and diarrhea can be a burning fire to this very sensitive area of our body, causing inflamed hemorrhoids, anal fissures, increasing risk is perirectal abscesses, abdominal pain/bloating, an making irritable bowel worse.     The goal: ONE SOFT BOWEL MOVEMENT A DAY!  To have soft, regular bowel movements:    Drink at least 8 tall glasses of water a day.     Take plenty of fiber.  Fiber is the undigested part of plant food that passes into the colon, acting s "natures broom" to encourage bowel motility and movement.  Fiber can absorb and hold large amounts of water. This results in a larger, bulkier stool, which is soft and easier to pass. Work gradually over several weeks up to 6 servings a day of fiber (25g a day even more if needed) in the form of: o Vegetables -- Root (potatoes, carrots, turnips),  leafy green (lettuce, salad greens, celery, spinach), or cooked high residue (cabbage, broccoli, etc) o Fruit -- Fresh (unpeeled skin & pulp), Dried (prunes, apricots, cherries, etc ),  or stewed ( applesauce)  o Whole grain breads, pasta, etc (whole wheat)  o Bran cereals    Bulking Agents -- This type of water-retaining fiber generally is easily obtained each day by one of the following:  o Psyllium bran -- The psyllium plant is remarkable because its ground seeds can retain so much water. This product is available as Metamucil, Konsyl, Effersyllium, Per Diem Fiber, or the less expensive generic preparation in drug and health food stores. Although labeled a laxative, it really is not a laxative.  o Methylcellulose -- This is another fiber derived from wood which also retains water. It is available as Citrucel. o Polyethylene Glycol - and "artificial" fiber commonly called Miralax or Glycolax.  It is helpful for people with gassy or bloated feelings with regular fiber o Flax Seed - a less gassy fiber than psyllium   No reading or other relaxing activity while on the toilet. If bowel movements take longer than 5 minutes, you are too constipated   AVOID CONSTIPATION.  High fiber and water intake usually takes care of this.  Sometimes a laxative is needed to stimulate more frequent bowel movements, but    Laxatives are not a good long-term solution as it  can wear the colon out. o Osmotics (Milk of Magnesia, Fleets phosphosoda, Magnesium citrate, MiraLax, GoLytely) are safer than  o Stimulants (Senokot, Castor Oil, Dulcolax, Ex Lax)    o Do not take laxatives for more than 7days in a row.    IF SEVERELY CONSTIPATED, try a Bowel Retraining Program: o Do not use laxatives.  o Eat a diet high in roughage, such as bran cereals and leafy vegetables.  o Drink six (6) ounces of prune or apricot juice each morning.  o Eat two (2) large servings of stewed fruit each day.  o Take one (1) heaping tablespoon of  a psyllium-based bulking agent twice a day. Use sugar-free sweetener when possible to avoid excessive calories.  o Eat a normal breakfast.  o Set aside 15 minutes after breakfast to sit on the toilet, but do not strain to have a bowel movement.  o If you do not have a bowel movement by the third day, use an enema and repeat the above steps.    Controlling diarrhea o Switch to liquids and simpler foods for a few days to avoid stressing your intestines further. o Avoid dairy products (especially milk & ice cream) for a short time.  The intestines often can lose the ability to digest lactose when stressed. o Avoid foods that cause gassiness or bloating.  Typical foods include beans and other legumes, cabbage, broccoli, and dairy foods.  Every person has some sensitivity to other foods, so listen to our body and avoid those foods that trigger problems for you. o Adding fiber (Citrucel, Metamucil, psyllium, Miralax) gradually can help thicken stools by absorbing excess fluid and retrain the intestines to act more normally.  Slowly increase the dose over a few weeks.  Too much fiber too soon can backfire and cause cramping & bloating. o Probiotics (such as active yogurt, Align, etc) may help repopulate the intestines and colon with normal bacteria and calm down a sensitive digestive tract.  Most studies show it to be of mild help, though, and such products can be costly. o Medicines:   Bismuth subsalicylate (ex. Kayopectate, Pepto Bismol) every 30 minutes for up to 6 doses can help control diarrhea.  Avoid if pregnant.   Loperamide (Immodium) can slow down diarrhea.  Start with two tablets (4mg  total) first and then try one tablet every 6 hours.  Avoid if you are having fevers or severe pain.  If you are not better or start feeling worse, stop all medicines and call your doctor for advice o Call your doctor if you are getting worse or not better.  Sometimes further testing (cultures, endoscopy, X-ray studies,  bloodwork, etc) may be needed to help diagnose and treat the cause of the diarrhea.  Diarrhea Diarrhea is frequent loose and watery bowel movements. It can cause you to feel weak and dehydrated. Dehydration can cause you to become tired and thirsty, have a dry mouth, and have decreased urination that often is dark yellow. Diarrhea is a sign of another problem, most often an infection that will not last long. In most cases, diarrhea typically lasts 2 3 days. However, it can last longer if it is a sign of something more serious. It is important to treat your diarrhea as directed by your caregive to lessen or prevent future episodes of diarrhea. CAUSES  Some common causes include:  Gastrointestinal infections caused by viruses, bacteria, or parasites.  Food poisoning or food allergies.  Certain medicines, such as antibiotics, chemotherapy, and laxatives.  Artificial sweeteners and fructose.  Digestive disorders. HOME CARE INSTRUCTIONS  Ensure adequate fluid intake (hydration): have 1 cup (8 oz) of fluid for each diarrhea episode. Avoid fluids that contain simple sugars or sports drinks, fruit juices, whole milk products, and sodas. Your urine should be clear or pale yellow if you are drinking enough fluids. Hydrate with an oral rehydration solution that you can purchase at pharmacies, retail stores, and online. You can prepare an oral rehydration solution at home by mixing the following ingredients together:    tsp table salt.   tsp baking soda.   tsp salt substitute containing potassium chloride.  1  tablespoons sugar.  1 L (34 oz) of water.  Certain foods and beverages may increase the speed at which food moves through the gastrointestinal (GI) tract. These foods and beverages should be avoided and include:  Caffeinated and alcoholic beverages.  High-fiber foods, such as raw fruits and vegetables, nuts, seeds, and whole grain breads and cereals.  Foods and beverages sweetened with  sugar alcohols, such as xylitol, sorbitol, and mannitol.  Some foods may be well tolerated and may help thicken stool including:  Starchy foods, such as rice, toast, pasta, low-sugar cereal, oatmeal, grits, baked potatoes, crackers, and bagels.  Bananas.  Applesauce.  Add probiotic-rich foods to help increase healthy bacteria in the GI tract, such as yogurt and fermented milk products.  Wash your hands well after each diarrhea episode.  Only take over-the-counter or prescription medicines as directed by your caregiver.  Take a warm bath to relieve any burning or pain from frequent diarrhea episodes. SEEK IMMEDIATE MEDICAL CARE IF:   You are unable to keep fluids down.  You have persistent vomiting.  You have blood in your stool, or your stools are black and tarry.  You do not urinate in 6 8 hours, or there is only a small amount of very dark urine.  You have abdominal pain that increases or localizes.  You have weakness, dizziness, confusion, or lightheadedness.  You have a severe headache.  Your diarrhea gets worse or does not get better.  You have a fever or persistent symptoms for more than 2 3 days.  You have a fever and your symptoms suddenly get worse. MAKE SURE YOU:   Understand these instructions.  Will watch your condition.  Will get help right away if you are not doing well or get worse. Document Released: 08/14/2002 Document Revised: 08/10/2012 Document Reviewed: 05/01/2012 [facility] Patient Information 2014 [place], [place].

## 2014-01-18 NOTE — Progress Notes (Signed)
Subjective:     Patient ID: Yolanda Spears, female   DOB: 1935-04-17, 79 y.o.   MRN: [id number]  HPI  Note: This dictation was prepared with Dragon/digital dictation along with Smartphrase technology. Any transcriptional errors that result from this process are unintentional.       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  She is here today with her son.  They are somewhat frustrated at the SNF.  We have not heard from her and almost 2 weeks.  She is having significant diarrhea still.  8-10 bowel movements a day.  She has noted some black stools.  Occasionally sticky as well.  No bright red blood. She wondered if the iron was not helping.  She stopped.  The diarrhea got worse.   Continues on her warfarin.  She took 2 Imodium last night.  Has not had a bowel movement in 12 hours.  Does not feel sick, nauseated or bloated.  Has tried Pepto-Bismol occasionally but does not think it helps.  She is eating okay but it runs right through.  She claims she is not had a bath in 4 days with the dressing still on.  No fevers or chills.  They are disappointed at the nursing facility as the care seems to be sporadic.  Hesitant to go home though as I think  the rehabilitation part is helping.  She is using an exercise bike but she says her favorite thing to do is sit and play checkers.  She does still have moderate edema on her legs.  They wonder if she can restart her diuretics.  Patient Active Problem List   Diagnosis Date Noted  . Internal hemorrhoids with irritation 01/04/2014  . 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 Encounter for therapeutic drug monitoring 10/05/2013  . Sleep apnea 03/29/2013  . History of adenomatous polyp of colon 04/07/2012  . Orthostatic lightheadedness 11/27/2011  . Partial small bowel obstruction - recurrent - s/p lysis of adhesions 12/28/2013 09/14/2011  . Vertigo 08/10/2011  . Hypercholesterolemia 08/10/2011  . TIA (transient ischemic attack) 04/28/2011  . Benign hypertensive heart disease without heart failure 01/09/2011  . First degree AV block 11/26/2010  . Pacemaker- Dual-chamber-mdt 11/26/2010  . Tachycardia-bradycardia syndrome 11/26/2010  . Atrial fibrillation 11/24/2010    Past Medical History  Diagnosis Date  . Pacemaker -[employer]   . Hypertension   . Peripheral neuropathy   . High cholesterol   . Heart murmur   . Shortness of breath on exertion   . Atrial fibrillation   . History of radiation therapy   . Partial bowel obstruction JAN/2013    recurrent  . Colon polyp 2011  . Osteopenia 02/2013    T score -1.4 FRAX 11%/2.1%  . Stroke     "small stroke &  several TIA's occasional visual problems / and occasional speach problems  . Arthritis   . Personal history of skin cancer   . Urinary leakage   . Hx: UTI (urinary tract infection)   . Sleep apnea     unaable to tolerate mask   . Endometrial cancer 2006    Stage IIIc  s/p radiation  . Breast cancer 1999    LOBULAR CARCINOMA IN SITU...  . Ringing in ears   . Orthostatic lightheadedness 11/27/2011  . Small bowel obstruction due to adhesions 12/28/2013    Past Surgical History   Procedure Laterality Date  . Lymph node resection  2007  . Cholecystectomy  1990's  . Mastectomy  1999    bilaterally w/lymph node dissection  . Abdominal hysterectomy  2006    BSO  . Oophorectomy      BSO  . Exploratory laparotomy  2007    remove abdominal cyst  . Insert / replace / remove pacemaker  2007    initial placement  2007 / REPLACED 2014     . Laparoscopic lysis intestinal adhesions  12/28/2013  . Abdominal adhesion surgery  12/28/2013  . Small intestine surgery  12/28/2013  . Laparoscopic lysis of adhesions N/A 12/28/2013    Procedure: LAPAROSCOPIC LYSIS OF ADHESIONS converted OPEN SMALL BOWEL;  Surgeon: Adin Hector, MD;  Location: [facility];  Service: General;  Laterality: N/A;    History   Social History  . Marital Status: Widowed    Spouse Name: N/A    Number of Children: 2  . Years of Education: N/A   Occupational History  . Retired    Social History Main Topics  . Smoking status: Former Smoker -- 1.00 packs/day for 40 years    Types: Cigarettes    Quit date: 11/26/1998  . Smokeless tobacco: Never Used  . Alcohol Use: No     Comment: 09/14/11 "last drink ~ 08/31/2009"  . Drug Use: No  . Sexual Activity: No   Other Topics Concern  . Not on file   Social History Narrative   Widowed mother of 2    Family History  Problem Relation Age of Onset  . Cancer Mother 79    UTERINE  . Breast cancer Mother 31  . Hypertension Mother   . Uterine cancer Mother   . Cancer Father     PROSTATE  . Hypertension Father     Current Outpatient Prescriptions  Medication Sig Dispense Refill  . aspirin 81 MG tablet Take 81 mg by mouth daily.      . calcium carbonate (TUMS - DOSED IN MG ELEMENTAL CALCIUM) 500 MG chewable tablet Chew 1 tablet by mouth daily.      . Cholecalciferol (VITAMIN D-3) 5000 UNITS TABS Take 1 tablet by mouth 4 (four) times a week. Takes on Saturday, Sunday, Tuesday and Thursday      . ezetimibe (ZETIA) 10 MG tablet Take 5 mg by mouth every  evening. Takes 1/2 tablet      . furosemide (LASIX) 20 MG tablet Take 20 mg by mouth every morning.       . hydrALAZINE (APRESOLINE) 25 MG tablet Take 25 mg by mouth 2 (two) times daily.      . hydrocortisone (PROCTOSOL HC) 2.5 % rectal cream Place 1 application rectally 2 (two) times daily.  30 g  0  . L-Methylfolate-B6-B12 (METANX PO) Take 1 tablet by mouth daily.       Marland Kitchen lisinopril (PRINIVIL,ZESTRIL) 10 MG tablet Take  10 mg by mouth 2 (two) times daily.      . metoprolol succinate (TOPROL-XL) 100 MG 24 hr tablet Take 100 mg by mouth every morning. Take with or immediately following a meal.      . metoprolol succinate (TOPROL-XL) 100 MG 24 hr tablet TAKE 1 TABLET BY MOUTH EVERY DAY WITH OR IMMEDIATELY FOLLOWING A MEAL  90 tablet  1  . pregabalin (LYRICA) 75 MG capsule Take 75 mg by mouth 2 (two) times daily.      . propafenone (RYTHMOL) 225 MG tablet Take 225 mg by mouth every 8 (eight) hours.      Marland Kitchen warfarin (COUMADIN) 2.5 MG tablet Take 0.5-1 tablets (1.25-2.5 mg total) by mouth every evening. Takes 1/2 tablet on Monday Wednesday and Friday and 1 tablet the rest of the week  60 tablet  0   No current facility-administered medications for this visit.     Allergies  Allergen Reactions  . Other Hives    Contrast dye   . Penicillins Itching  . Shellfish-Derived Products Nausea And Vomiting  . Statins     Myalgias,elevated LFT studies  . Iohexol Hives and Itching    Itching in eyes, one hive on face.    BP 132/70  Pulse 83  Temp(Src) 98 F (36.7 C)  Ht 5\' 7"  (1.702 m)  Wt 207 lb (93.895 kg)  BMI 32.41 kg/m2  No results found.   Review of Systems  Constitutional: Negative for fever, chills and diaphoresis.  HENT: Negative for ear pain, sore throat and trouble swallowing.   Eyes: Negative for photophobia and visual disturbance.  Respiratory: Negative for cough and choking.   Cardiovascular: Negative for chest pain and palpitations.  Gastrointestinal: Negative for nausea,  vomiting, abdominal pain, diarrhea, constipation, anal bleeding and rectal pain.  Genitourinary: Negative for dysuria, frequency and difficulty urinating.  Musculoskeletal: Negative for gait problem and myalgias.  Skin: Negative for color change, pallor and rash.  Neurological: Negative for dizziness, speech difficulty, weakness and numbness.  Hematological: Negative for adenopathy.  Psychiatric/Behavioral: Negative for confusion and agitation. The patient is not nervous/anxious.        Objective:   Physical Exam  Constitutional: She is oriented to person, place, and time. She appears well-developed and well-nourished. No distress.  HENT:  Head: Normocephalic.  Mouth/Throat: Oropharynx is clear and moist. No oropharyngeal exudate.  Eyes: Conjunctivae and EOM are normal. Pupils are equal, round, and reactive to light. No scleral icterus.  Neck: Normal range of motion. No tracheal deviation present.  Cardiovascular: Normal rate and intact distal pulses.   Pulmonary/Chest: Effort normal. No respiratory distress. She exhibits no tenderness.  Abdominal: Soft. She exhibits no distension. There is no tenderness. Hernia confirmed negative in the ventral area, confirmed negative in the right inguinal area and confirmed negative in the left inguinal area.    Incisions clean with normal healing ridges.  No hernias  Genitourinary: No vaginal discharge found.  Musculoskeletal: Normal range of motion. She exhibits edema. She exhibits no tenderness.  Lymphadenopathy:       Right: No inguinal adenopathy present.       Left: No inguinal adenopathy present.  Neurological: She is alert and oriented to person, place, and time. No cranial nerve deficit. She exhibits normal muscle tone. Coordination normal.  Skin: Skin is warm and dry. No rash noted. She is not diaphoretic.  Psychiatric: She has a normal mood and affect. Her behavior is normal.       Assessment:  Struggling with postoperative  diarrhea.  Question of melena.     Plan:     I think she needs to get her diarrhea under better control.  I would go more slowly.  Use Imodium 2 mg twice a day.  Add PRN 2-4mg  q6 hours when necessary breakthrough diarrhea.  Restart iron.  Helps with anemia and is also constipating.  TID.  Check Hgb level  I recommend to do a fiber bowel regimen.  Metamucil twice a day.  Should palpable stools.  If she is not feeling, discuss [facility].  See if she should start a PPI or other more aggressive antidiarrheal regimen.  Lab work.  Make sure she is not severely anemic.  Check INR.  Make sure she is not dehydrated.  If her labs look okay and her diarrhea is under better control, restart her furosemide diuretics.  I think it would be good idea for her discussed with her primary care physician and her cardiologist and she is quite complicated medically.  They need to wash the incision and change the dressing every day.  I noted that if she feels strong enough, she can go home with close followup.  She does not look in any distress & is at her usual baseline, albeit with diarrhea.    They want to try and stay a little bit longer for the benefits of rehabilitation.   Follow her in 2 weeks.  Have her come sooner if there are issues.

## 2014-01-19 ENCOUNTER — Telehealth (INDEPENDENT_AMBULATORY_CARE_PROVIDER_SITE_OTHER): Payer: Self-pay

## 2014-01-19 LAB — PROTIME-INR
INR: 2.78 — ABNORMAL HIGH (ref <1.50)
Prothrombin Time: 28.6 seconds — ABNORMAL HIGH (ref 11.6–15.2)

## 2014-01-19 NOTE — Telephone Encounter (Signed)
Pt's son returned my call. I advised him that I did call the nurse taking care of the pt and gave her verbal orders per Dr Johney [place]. I also faxed the office note of Dr Johney [place] office visit [phone] attn: Gabriel Cirri. I advised that I did go over the bathing issue and dressing changes that need to be done daily. I advised that the labs were ok and Dr Johney [place] did give the ok for the pt to restart her Furosemide along with the iron medication. I advised the son if any questions after checking with [facility] to please call me on Monday.

## 2014-01-19 NOTE — Telephone Encounter (Signed)
Called to speak to [place] at [facility] but they told me to speak to [place] who is the nurse for the pt. I wanted to clarify about the pt's dressing change orders along with getting bathed daily per Dr Johney [place]. I advised that Dr Johney [place] wants the surgical area washed daily with soap and water along with daily dressing changes. I advised that I was going to send Dr Clyda Greener office notes from yesterday that has all this in the note that I am giving to her verbal. I advised we did draw labs on the pt that I am going to fax over to her as well with orders on the labs about the pt starting her Furosemide and iron per Dr Johney [place]. I advised Jeani Hawking if any questions to please call me once she views the office notes from Dr Johney [place]. Jeani Hawking understands.

## 2014-01-19 NOTE — Telephone Encounter (Signed)
LMOM for pt's son to call me.

## 2014-01-30 ENCOUNTER — Other Ambulatory Visit: Payer: Self-pay | Admitting: Pharmacist Clinician (PhC)/ Clinical Pharmacy Specialist

## 2014-01-30 MED ORDER — WARFARIN SODIUM 2.5 MG PO TABS: ORAL_TABLET | ORAL | Status: DC

## 2014-01-31 ENCOUNTER — Encounter: Payer: Self-pay | Admitting: Physician Assistant

## 2014-01-31 ENCOUNTER — Ambulatory Visit (INDEPENDENT_AMBULATORY_CARE_PROVIDER_SITE_OTHER): Payer: Medicare Other | Admitting: Physician Assistant

## 2014-01-31 ENCOUNTER — Ambulatory Visit
Admission: RE | Admit: 2014-01-31 | Discharge: 2014-01-31 | Disposition: A | Discharge status: Home / Self Care | Payer: Medicare Other | Source: Ambulatory Visit | Attending: Physician Assistant | Admitting: Physician Assistant

## 2014-01-31 ENCOUNTER — Telehealth: Payer: Self-pay | Admitting: *Deleted

## 2014-01-31 ENCOUNTER — Ambulatory Visit (INDEPENDENT_AMBULATORY_CARE_PROVIDER_SITE_OTHER): Payer: Medicare Other | Admitting: *Deleted

## 2014-01-31 VITALS — BP 145/60 | HR 81 | Ht 67.0 in | Wt 194.0 lb

## 2014-01-31 DIAGNOSIS — I4891 Unspecified atrial fibrillation: Secondary | ICD-10-CM

## 2014-01-31 DIAGNOSIS — Z950 Presence of cardiac pacemaker: Secondary | ICD-10-CM

## 2014-01-31 DIAGNOSIS — I509 Heart failure, unspecified: Secondary | ICD-10-CM

## 2014-01-31 DIAGNOSIS — R0602 Shortness of breath: Secondary | ICD-10-CM

## 2014-01-31 DIAGNOSIS — G459 Transient cerebral ischemic attack, unspecified: Secondary | ICD-10-CM

## 2014-01-31 DIAGNOSIS — I5033 Acute on chronic diastolic (congestive) heart failure: Secondary | ICD-10-CM

## 2014-01-31 DIAGNOSIS — K56609 Unspecified intestinal obstruction, unspecified as to partial versus complete obstruction: Secondary | ICD-10-CM

## 2014-01-31 DIAGNOSIS — R011 Cardiac murmur, unspecified: Secondary | ICD-10-CM

## 2014-01-31 DIAGNOSIS — Z5181 Encounter for therapeutic drug level monitoring: Secondary | ICD-10-CM

## 2014-01-31 DIAGNOSIS — I495 Sick sinus syndrome: Secondary | ICD-10-CM

## 2014-01-31 DIAGNOSIS — K56600 Partial intestinal obstruction, unspecified as to cause: Secondary | ICD-10-CM

## 2014-01-31 DIAGNOSIS — G4730 Sleep apnea, unspecified: Secondary | ICD-10-CM

## 2014-01-31 DIAGNOSIS — I110 Hypertensive heart disease with heart failure: Secondary | ICD-10-CM

## 2014-01-31 LAB — PROTIME-INR
INR: 9.6 ratio (ref 0.8–1.0)
Prothrombin Time: 100.1 s (ref 9.6–13.1)

## 2014-01-31 LAB — BASIC METABOLIC PANEL
BUN: 13 mg/dL (ref 6–23)
CALCIUM: 9.3 mg/dL (ref 8.4–10.5)
CO2: 35 meq/L — AB (ref 19–32)
CREATININE: 1.2 mg/dL (ref 0.4–1.2)
Chloride: 96 mEq/L (ref 96–112)
GFR: 45.86 mL/min — ABNORMAL LOW (ref >60.00)
GLUCOSE: 115 mg/dL — AB (ref 70–99)
Potassium: 4.5 mEq/L (ref 3.5–5.1)
Sodium: 139 mEq/L (ref 135–145)

## 2014-01-31 LAB — BRAIN NATRIURETIC PEPTIDE: Pro B Natriuretic peptide (BNP): 357 pg/mL — ABNORMAL HIGH (ref 0.0–100.0)

## 2014-01-31 MED ORDER — IRON 325 (65 FE) MG PO TABS: 1.0000 | ORAL_TABLET | Freq: Every day | ORAL | Status: DC

## 2014-01-31 MED ORDER — LOPERAMIDE HCL 2 MG PO TABS: 2.0000 mg | ORAL_TABLET | Freq: Two times a day (BID) | ORAL | Status: DC

## 2014-01-31 MED ORDER — POTASSIUM CHLORIDE CRYS ER 20 MEQ PO TBCR: 20.0000 meq | EXTENDED_RELEASE_TABLET | Freq: Two times a day (BID) | ORAL | Status: DC

## 2014-01-31 MED ORDER — FUROSEMIDE 40 MG PO TABS: 40.0000 mg | ORAL_TABLET | Freq: Two times a day (BID) | ORAL | Status: DC

## 2014-01-31 NOTE — Patient Instructions (Signed)
Your physician has recommended you make the following change in your medication:  INCREASE LASIX TO 40 MG TWICE DAILY; NEW RX SENT IN INCREASE POTASSIUM TO 20 MEQ TWICE DAILY; NEW RX SENT IN  LAB WORK TODAY; BMET, BNP  REPEAT BMET IN 1 WEEK  Your physician has requested that you have an echocardiogram. Echocardiography is a painless test that uses sound waves to create images of your heart. It provides your doctor with information about the size and shape of your heart and how well your heart's chambers and valves are working. This procedure takes approximately one hour. There are no restrictions for this procedure.  A chest x-ray takes a picture of the organs and structures inside the chest, including the heart, lungs, and blood vessels. This test can show several things, including, whether the heart is enlarges; whether fluid is building up in the lungs; and whether pacemaker / defibrillator leads are still in place.  3Your physician recommends that you schedule a follow-up appointment in: [place] ECHO IS COMPLETED ;WITH [facility]ER, PAC SAME DAY DR. Mare Ferrari IS IN THE OFFICE

## 2014-01-31 NOTE — Telephone Encounter (Signed)
pt's son notified about lab and cxr results and to make sure continue increased dose of lasix and K+, bmet next week. Advised son if pt seems more sob or does not seem to be loosing any fluid call office to let us know or call the on call for advice.

## 2014-01-31 NOTE — Progress Notes (Signed)
Cardiology Office Note   Date:  01/31/2014   ID:  Yolanda Spears, DOB 1935/02/05, MRN [id number]  PCP:  Thressa Sheller, MD  Cardiologist:  Dr. Darlin Coco   Electrophysiologist:  Dr. Virl Axe    History of Present Illness: Yolanda Spears is a 79 y.o. female paroxysmal AFib, tachy-brady syndrome s/p pacemaker, prior TIA, carotid stenosis, vertigo, OSA.  She underwent adhesiolysis in 12/2013 with Dr. Johney Maine 2/2 recurrent SBO.  Last seen by Dr. Darlin Coco prior to her surgery (in 11/2013).    The patient was discharged to [facility] after her surgery.  She spent 20 days there.  While there, she developed worsening dyspnea, LE edema.  She has been NYHA Class 3.  Her weight increased to 218 lbs.  She notes 4 pillow orthopnea.  No PND.  She denies chest pain or syncope.  She was told that she had "fluid" on a CXR.  She was also told her O2 was in the 80s after ambulation and she was put on O2 prn.  She notes occasional wheezing.  She has no hx of Asthma.  She denies cough.  She tells me her Lasix was increased from 20 mg to 40 mg QD.  She has noted minimal improvement with this.  She eventually took herself out of [facility] and is back home.  She is here with her son today.     Studies:  - Echo (04/2012):  EF 60-65%, no RWMA, Gr 2 DD, mild AI, mild MR, mild LAE, PASP 41 mmHg.  - Carotid US (05/2013):  Bilateral ICA 40-59% - f/u 1 year   Recent Labs: 01/18/2014: ALT 18; Creatinine 1.04; Hemoglobin 8.8*; Potassium 3.8   Wt Readings from Last 3 Encounters:  01/31/14 194 lb (87.998 kg)  01/18/14 207 lb (93.895 kg)  01/04/14 204 lb 9.4 oz (92.8 kg)     Past Medical History  Diagnosis Date  . Pacemaker -Medtronic   . Hypertension   . Peripheral neuropathy   . High cholesterol   . Heart murmur   . Shortness of breath on exertion   . Atrial fibrillation   . History of radiation therapy   . Partial bowel obstruction JAN/2013    recurrent  . Colon polyp 2011  . Osteopenia  02/2013    T score -1.4 FRAX 11%/2.1%  . Stroke     "small stroke & several TIA's occasional visual problems / and occasional speach problems  . Arthritis   . Personal history of skin cancer   . Urinary leakage   . Hx: UTI (urinary tract infection)   . Sleep apnea     unaable to tolerate mask   . Endometrial cancer 2006    Stage IIIc  s/p radiation  . Breast cancer 1999    LOBULAR CARCINOMA IN SITU...  . Ringing in ears   . Orthostatic lightheadedness 11/27/2011  . Small bowel obstruction due to adhesions 12/28/2013    Current Outpatient Prescriptions  Medication Sig Dispense Refill  . aspirin 81 MG tablet Take 81 mg by mouth daily.      . calcium carbonate (TUMS - DOSED IN MG ELEMENTAL CALCIUM) 500 MG chewable tablet Chew 1 tablet by mouth daily.      . Cholecalciferol (VITAMIN D-3) 5000 UNITS TABS Take 1 tablet by mouth 4 (four) times a week. Takes on Saturday, Sunday, Tuesday and Thursday      . ezetimibe (ZETIA) 10 MG tablet Take 5 mg by mouth every evening. Takes 1/2 tablet      .  furosemide (LASIX) 20 MG tablet Take 40 mg by mouth daily.       . hydrALAZINE (APRESOLINE) 25 MG tablet Take 25 mg by mouth 3 (three) times daily.       . hydrocortisone (PROCTOSOL HC) 2.5 % rectal cream Place 1 application rectally 2 (two) times daily.  30 g  0  . lisinopril (PRINIVIL,ZESTRIL) 10 MG tablet Take 10 mg by mouth 2 (two) times daily.      . metoprolol succinate (TOPROL-XL) 100 MG 24 hr tablet Take 100 mg by mouth every morning. Take with or immediately following a meal.      . pregabalin (LYRICA) 75 MG capsule Take 75 mg by mouth 2 (two) times daily.      . propafenone (RYTHMOL) 225 MG tablet Take 225 mg by mouth every 8 (eight) hours.      Marland Kitchen warfarin (COUMADIN) 2.5 MG tablet Take 1 tablet by mouth daily or as directed  30 tablet  5  . Ferrous Sulfate (IRON) 325 (65 FE) MG TABS Take 1 tablet by mouth at bedtime.      Marland Kitchen loperamide (IMODIUM A-D) 2 MG tablet Take 1 tablet (2 mg total) by  mouth 2 (two) times daily.       No current facility-administered medications for this visit.    Allergies:   Other; Penicillins; Shellfish-derived products; Statins; and Iohexol   Social History:  The patient  reports that she quit smoking about 15 years ago. Her smoking use included Cigarettes. She has a 40 pack-year smoking history. She has never used smokeless tobacco. She reports that she does not drink alcohol or use illicit drugs.   Family History:  The patient's family history includes Breast cancer (age of onset: 20) in her mother; Cancer in her father; Cancer (age of onset: 65) in her mother; Hypertension in her father and mother; Uterine cancer in her mother.   ROS:  Please see the history of present illness.   She has had persistent post op diarrhea.  This is slowly resolving.  She denies melena or hematochezia.   All other systems reviewed and negative.   PHYSICAL EXAM: VS:  BP 145/60  Pulse 81  Ht 5\' 7"  (1.702 m)  Wt 194 lb (87.998 kg)  BMI 30.38 kg/m2 Well nourished, well developed, in no acute distress HEENT: normal Neck: no JVDat 90 degrees Cardiac:  normal S1, S2; RRR; 1/6 harsh systolic murmurat the LLSB Lungs:  Decreased breath sounds bilaterally, no wheezing, rhonchi or ; I cannot appreciate egophony Abd: soft, nontender, no hepatomegaly Ext: trace bilateral LE edema Skin: warm and dry Neuro:  CNs 2-12 intact, no focal abnormalities noted  EKG:  AV paced, HR 81     ASSESSMENT AND PLAN:  1. Acute on chronic diastolic heart failure:  She has not had a hx of CHF in the past but now has evidence of volume overload.  Prior echo demonstrated mod diastolic dysfunction.  She is hypoxic.  Recent INRs have been > 2.  I have asked her to remain on O2 at all times.  I will increase Lasix to 40 mg bid and K+ to 20 mEq bid.  Check a BMET, BNP today and repeat in 1 week.  Check a CXR today.  Obtain an echo.  Arrange close follow up.  I reviewed her case with Dr. Sherren Mocha  (DOD) who agreed with the plan. 2. Atrial fibrillation:  Maintaining NSR on Rythmol.  continue coumadin.  3. Tachycardia-bradycardia syndrome s/p Pacemaker-  Dual-chamber-mdt:  F/u with EP as planned.  4. Benign hypertensive heart disease:  BP near target.  Increase Lasix as noted. 5. TIA (transient ischemic attack):  Continue ASA, coumadin. 6. Partial small bowel obstruction - recurrent - s/p lysis of adhesions 12/28/2013:  F/u with gen surgery as planned. 7. Sleep apnea:  Continue CPAP. 8. Disposition:  F/u with Dr. Darlin Coco or me in 1 week.   Signed, Versie Starks, MHS 01/31/2014 2:41 PM    [facility] [place], [place], [place]  [address] Phone: [phone]; Fax: [phone]

## 2014-02-02 ENCOUNTER — Ambulatory Visit (INDEPENDENT_AMBULATORY_CARE_PROVIDER_SITE_OTHER): Payer: Medicare Other | Admitting: Surgery

## 2014-02-02 ENCOUNTER — Encounter (INDEPENDENT_AMBULATORY_CARE_PROVIDER_SITE_OTHER): Payer: Medicare Other | Admitting: Surgery

## 2014-02-02 DIAGNOSIS — I4891 Unspecified atrial fibrillation: Secondary | ICD-10-CM

## 2014-02-02 DIAGNOSIS — Z5181 Encounter for therapeutic drug level monitoring: Secondary | ICD-10-CM

## 2014-02-02 LAB — POCT INR: INR: 7.5

## 2014-02-05 ENCOUNTER — Ambulatory Visit (HOSPITAL_COMMUNITY)
Admission: RE | Admit: 2014-02-05 | Discharge: 2014-02-05 | Disposition: A | Discharge status: Home / Self Care | Payer: Medicare Other | Source: Ambulatory Visit | Attending: Physician Assistant | Admitting: Physician Assistant

## 2014-02-05 ENCOUNTER — Ambulatory Visit (INDEPENDENT_AMBULATORY_CARE_PROVIDER_SITE_OTHER): Payer: Medicare Other | Admitting: Pharmacist Clinician (PhC)/ Clinical Pharmacy Specialist

## 2014-02-05 DIAGNOSIS — R011 Cardiac murmur, unspecified: Secondary | ICD-10-CM | POA: Insufficient documentation

## 2014-02-05 DIAGNOSIS — R0602 Shortness of breath: Secondary | ICD-10-CM

## 2014-02-05 DIAGNOSIS — I5033 Acute on chronic diastolic (congestive) heart failure: Secondary | ICD-10-CM

## 2014-02-05 DIAGNOSIS — Z5181 Encounter for therapeutic drug level monitoring: Secondary | ICD-10-CM

## 2014-02-05 DIAGNOSIS — I359 Nonrheumatic aortic valve disorder, unspecified: Secondary | ICD-10-CM

## 2014-02-05 DIAGNOSIS — I495 Sick sinus syndrome: Secondary | ICD-10-CM

## 2014-02-05 DIAGNOSIS — I4891 Unspecified atrial fibrillation: Secondary | ICD-10-CM

## 2014-02-05 LAB — POCT INR: INR: 1.9

## 2014-02-05 NOTE — Progress Notes (Signed)
2D Echocardiogram Complete.  02/05/2014   Suha Schoenbeck, RDCS 

## 2014-02-06 ENCOUNTER — Telehealth: Payer: Self-pay | Admitting: *Deleted

## 2014-02-06 ENCOUNTER — Encounter: Payer: Self-pay | Admitting: Physician Assistant

## 2014-02-06 NOTE — Telephone Encounter (Signed)
pt notified about echo results with verbal understanding 

## 2014-02-07 ENCOUNTER — Other Ambulatory Visit (INDEPENDENT_AMBULATORY_CARE_PROVIDER_SITE_OTHER): Payer: Medicare Other

## 2014-02-07 DIAGNOSIS — I5033 Acute on chronic diastolic (congestive) heart failure: Secondary | ICD-10-CM

## 2014-02-07 LAB — BASIC METABOLIC PANEL
BUN: 15 mg/dL (ref 6–23)
CHLORIDE: 100 meq/L (ref 96–112)
CO2: 33 meq/L — AB (ref 19–32)
Calcium: 9.3 mg/dL (ref 8.4–10.5)
Creatinine, Ser: 1.2 mg/dL (ref 0.4–1.2)
GFR: 47.21 mL/min — ABNORMAL LOW (ref >60.00)
GLUCOSE: 116 mg/dL — AB (ref 70–99)
POTASSIUM: 5 meq/L (ref 3.5–5.1)
SODIUM: 139 meq/L (ref 135–145)

## 2014-02-08 ENCOUNTER — Telehealth: Payer: Self-pay | Admitting: *Deleted

## 2014-02-08 NOTE — Telephone Encounter (Signed)
s/w pt's sister [name] since there was no answer on pt's home # nor no machine to lmom. Sister said she will let pt know of the lab results. I gave [phone] if pt does have nay questions .

## 2014-02-12 ENCOUNTER — Encounter: Payer: Self-pay | Admitting: Physician Assistant

## 2014-02-12 ENCOUNTER — Ambulatory Visit (INDEPENDENT_AMBULATORY_CARE_PROVIDER_SITE_OTHER): Payer: Medicare Other | Admitting: Physician Assistant

## 2014-02-12 ENCOUNTER — Ambulatory Visit (INDEPENDENT_AMBULATORY_CARE_PROVIDER_SITE_OTHER): Payer: Medicare Other | Admitting: Surgery

## 2014-02-12 VITALS — BP 110/50 | HR 74 | Ht 67.0 in | Wt 186.0 lb

## 2014-02-12 DIAGNOSIS — I5032 Chronic diastolic (congestive) heart failure: Secondary | ICD-10-CM

## 2014-02-12 DIAGNOSIS — I4891 Unspecified atrial fibrillation: Secondary | ICD-10-CM

## 2014-02-12 DIAGNOSIS — K56609 Unspecified intestinal obstruction, unspecified as to partial versus complete obstruction: Secondary | ICD-10-CM

## 2014-02-12 DIAGNOSIS — K56600 Partial intestinal obstruction, unspecified as to cause: Secondary | ICD-10-CM

## 2014-02-12 DIAGNOSIS — G459 Transient cerebral ischemic attack, unspecified: Secondary | ICD-10-CM

## 2014-02-12 DIAGNOSIS — I119 Hypertensive heart disease without heart failure: Secondary | ICD-10-CM

## 2014-02-12 DIAGNOSIS — Z5181 Encounter for therapeutic drug level monitoring: Secondary | ICD-10-CM

## 2014-02-12 DIAGNOSIS — J90 Pleural effusion, not elsewhere classified: Secondary | ICD-10-CM

## 2014-02-12 DIAGNOSIS — D649 Anemia, unspecified: Secondary | ICD-10-CM

## 2014-02-12 LAB — POCT INR: INR: 2.3

## 2014-02-12 NOTE — Patient Instructions (Signed)
Your physician recommends that you continue on your current medications as directed. Please refer to the Current Medication list given to you today.  LAB WORK TODAY BMET, CBC W/DIFF  CHEST X-RAY IN 1 WEEK AT [place] [facility] WITH THE RESULTS TO BE FAXED TO [place], [place]  Your physician recommends that you schedule a follow-up appointment in: [place] DR. Mare Ferrari

## 2014-02-12 NOTE — Progress Notes (Signed)
Cardiology Office Note   Date:  02/12/2014   ID:  Yolanda Spears, DOB 08/17/1935, MRN [id number]  PCP:  Thressa Sheller, MD  Cardiologist:  Dr. Darlin Coco   Electrophysiologist:  Dr. Virl Axe    History of Present Illness: Yolanda Spears is a 79 y.o. female paroxysmal AFib, tachy-brady syndrome s/p pacemaker, prior TIA, carotid stenosis, vertigo, OSA.  She underwent adhesiolysis in 12/2013 with Dr. Johney Maine 2/2 recurrent SBO.  Last seen by Dr. Darlin Coco prior to her surgery (in 11/2013).    I saw her 01/31/14.  She had developed significant volume overload while at [facility].   I adjusted her Lasix.  F/u echo demonstrated normal LVF.  CXR demonstrated bilateral pleural effusions.  She returns for follow up.   Her breathing has improved. She is probably NYHA class IIb. She still sleeps on 4 pillows. She has not tried to lay flat. She denies PND. LE edema is improved. She denies chest pain or syncope. She continues to have problems with diarrhea.   Studies:  - Echo (04/2012):  EF 60-65%, no RWMA, Gr 2 DD, mild AI, mild MR, mild LAE, PASP 41 mmHg.  - Echo (02/05/14):  EF 55-60%, no RWMA, mild AI, mild MR, mild LAE, PASP 44 mmHg.  - Carotid US (05/2013):  Bilateral ICA 40-59% - f/u 1 year   CXR (01/31/14): IMPRESSION: 1. Bilateral pleural effusions left slightly larger than right. 2. Emphysema. 3. Cardiomegaly with permanent pacemaker.   Recent Labs: 01/18/2014: ALT 18; Hemoglobin 8.8*  01/31/2014: Pro B Natriuretic peptide (BNP) 357.0*  02/07/2014: Creatinine 1.2; Potassium 5.0   Wt Readings from Last 3 Encounters:  02/12/14 186 lb (84.369 kg)  01/31/14 194 lb (87.998 kg)  01/18/14 207 lb (93.895 kg)     Past Medical History  Diagnosis Date  . Pacemaker -Medtronic   . Hypertension   . Peripheral neuropathy   . High cholesterol   . Heart murmur   . Shortness of breath on exertion   . Atrial fibrillation   . History of radiation therapy   . Partial bowel  obstruction JAN/2013    recurrent  . Colon polyp 2011  . Osteopenia 02/2013    T score -1.4 FRAX 11%/2.1%  . Stroke     "small stroke & several TIA's occasional visual problems / and occasional speach problems  . Arthritis   . Personal history of skin cancer   . Urinary leakage   . Hx: UTI (urinary tract infection)   . Sleep apnea     unaable to tolerate mask   . Endometrial cancer 2006    Stage IIIc  s/p radiation  . Breast cancer 1999    LOBULAR CARCINOMA IN SITU...  . Ringing in ears   . Orthostatic lightheadedness 11/27/2011  . Small bowel obstruction due to adhesions 12/28/2013  . Hx of echocardiogram     Echo (02/2014):  EF 55-60%, no RWMA, mild AI, mild MR, mild LAE, PASP 44 mmHg    Current Outpatient Prescriptions  Medication Sig Dispense Refill  . aspirin 81 MG tablet Take 81 mg by mouth daily.      . calcium carbonate (TUMS - DOSED IN MG ELEMENTAL CALCIUM) 500 MG chewable tablet Chew 1 tablet by mouth daily.      . Cholecalciferol (VITAMIN D-3) 5000 UNITS TABS Take 1 tablet by mouth 4 (four) times a week. Takes on Saturday, Sunday, Tuesday and Thursday      . ezetimibe (ZETIA) 10 MG tablet Take  5 mg by mouth every evening. Takes 1/2 tablet      . Ferrous Sulfate (IRON) 325 (65 FE) MG TABS Take 1 tablet by mouth at bedtime.      . furosemide (LASIX) 40 MG tablet Take 1 tablet (40 mg total) by mouth 2 (two) times daily.  60 tablet  11  . hydrALAZINE (APRESOLINE) 25 MG tablet Take 25 mg by mouth 3 (three) times daily.       Marland Kitchen lisinopril (PRINIVIL,ZESTRIL) 10 MG tablet Take 10 mg by mouth 2 (two) times daily.      Marland Kitchen loperamide (IMODIUM A-D) 2 MG tablet Take 1 tablet (2 mg total) by mouth 2 (two) times daily.      . metoprolol succinate (TOPROL-XL) 100 MG 24 hr tablet Take 100 mg by mouth every morning. Take with or immediately following a meal.      . potassium chloride SA (K-DUR,KLOR-CON) 20 MEQ tablet Take 1 tablet (20 mEq total) by mouth 2 (two) times daily.  60 tablet  11    . pregabalin (LYRICA) 75 MG capsule Take 75 mg by mouth 2 (two) times daily.      . propafenone (RYTHMOL) 225 MG tablet Take 225 mg by mouth every 8 (eight) hours.      . saccharomyces boulardii (FLORASTOR) 250 MG capsule Take 250 mg by mouth daily.      Marland Kitchen warfarin (COUMADIN) 2.5 MG tablet Take 1 tablet by mouth daily or as directed  30 tablet  5   No current facility-administered medications for this visit.    Allergies:   Other; Penicillins; Shellfish-derived products; Statins; and Iohexol   Social History:  The patient  reports that she quit smoking about 15 years ago. Her smoking use included Cigarettes. She has a 40 pack-year smoking history. She has never used smokeless tobacco. She reports that she does not drink alcohol or use illicit drugs.   Family History:  The patient's family history includes Breast cancer (age of onset: 49) in her mother; Cancer in her father; Cancer (age of onset: 54) in her mother; Hypertension in her father and mother; Uterine cancer in her mother.   ROS:  Please see the history of present illness.     All other systems reviewed and negative.   PHYSICAL EXAM: VS:  BP 110/50  Pulse 74  Ht 5\' 7"  (1.702 m)  Wt 186 lb (84.369 kg)  BMI 29.12 kg/m2  SpO2 93% Well nourished, well developed, in no acute distress HEENT: normal Neck: no JVDat 90 degrees Cardiac:  normal S1, S2; RRR; 1/6 systolic murmurat the RUSB Lungs:  Decreased breath sounds bilaterally, no wheezing, rhonchi or ; I cannot appreciate egophony Abd: soft, nontender, no hepatomegaly Ext: trace bilateral LE edema Skin: warm and dry Neuro:  CNs 2-12 intact, no focal abnormalities noted  EKG:  AV paced HR 74     ASSESSMENT AND PLAN:  1. Chronic diastolic heart failure:  Volume is improved. She is down 8 lbs.  Continue current Rx. Check BMET today.  Oxygen on RA is ok.  Continue with O2 with ambulation for now.  Can consider DC O2 at follow up.   2. Pleural Effusions:  Likely related to  volume excess.  Will repeat CXR in 1 week.  If effusions reduced, plan to decrease Lasix back to QD at that time.   3. Atrial fibrillation:  Maintaining NSR on Rythmol.  continue coumadin.  4. Tachycardia-bradycardia syndrome s/p Pacemaker- Dual-chamber-mdt:  F/u with EP as  planned.  5. Benign hypertensive heart disease:  HTN controlled. 6. TIA (transient ischemic attack):  Continue ASA, coumadin. 7. Partial small bowel obstruction - recurrent - s/p lysis of adhesions 12/28/2013:   F/u with gen surgery as planned.  She would like to know if her Hgb has come up.  She was as low as 8.8 after surgery.  Will repeat CBC today.   8. Sleep apnea:  Continue CPAP. 9. Disposition:  F/u with Dr. Darlin Coco in 1 month.   Signed, Versie Starks, MHS 02/12/2014 3:57 PM    [facility] [address], [place], [place]  [address] Phone: [phone]; Fax: [phone]

## 2014-02-13 ENCOUNTER — Ambulatory Visit: Payer: Medicare Other | Admitting: Physician Assistant

## 2014-02-13 LAB — CBC WITH DIFFERENTIAL/PLATELET
Basophils Absolute: 0.1 10*3/uL (ref 0.0–0.1)
Basophils Relative: 0.8 % (ref 0.0–3.0)
EOS ABS: 0.2 10*3/uL (ref 0.0–0.7)
EOS PCT: 3.2 % (ref 0.0–5.0)
HEMATOCRIT: 33.8 % — AB (ref 36.0–46.0)
Hemoglobin: 10.7 g/dL — ABNORMAL LOW (ref 12.0–15.0)
LYMPHS ABS: 1.6 10*3/uL (ref 0.7–4.0)
Lymphocytes Relative: 24.1 % (ref 12.0–46.0)
MCHC: 31.8 g/dL (ref 30.0–36.0)
MCV: 89.4 fl (ref 78.0–100.0)
MONO ABS: 0.6 10*3/uL (ref 0.1–1.0)
Monocytes Relative: 9.5 % (ref 3.0–12.0)
NEUTROS PCT: 62.4 % (ref 43.0–77.0)
Neutro Abs: 4.1 10*3/uL (ref 1.4–7.7)
Platelets: 213 10*3/uL (ref 150.0–400.0)
RBC: 3.78 Mil/uL — AB (ref 3.87–5.11)
RDW: 17.6 % — ABNORMAL HIGH (ref 11.5–15.5)
WBC: 6.5 10*3/uL (ref 4.0–10.5)

## 2014-02-13 LAB — BASIC METABOLIC PANEL
BUN: 18 mg/dL (ref 6–23)
CHLORIDE: 101 meq/L (ref 96–112)
CO2: 31 meq/L (ref 19–32)
CREATININE: 1.4 mg/dL — AB (ref 0.4–1.2)
Calcium: 9.4 mg/dL (ref 8.4–10.5)
GFR: 40.07 mL/min — ABNORMAL LOW (ref >60.00)
Glucose, Bld: 91 mg/dL (ref 70–99)
POTASSIUM: 5.1 meq/L (ref 3.5–5.1)
Sodium: 139 mEq/L (ref 135–145)

## 2014-02-14 ENCOUNTER — Telehealth: Payer: Self-pay | Admitting: *Deleted

## 2014-02-14 DIAGNOSIS — R0602 Shortness of breath: Secondary | ICD-10-CM

## 2014-02-14 DIAGNOSIS — I119 Hypertensive heart disease without heart failure: Secondary | ICD-10-CM

## 2014-02-14 DIAGNOSIS — I4891 Unspecified atrial fibrillation: Secondary | ICD-10-CM

## 2014-02-14 DIAGNOSIS — I5033 Acute on chronic diastolic (congestive) heart failure: Secondary | ICD-10-CM

## 2014-02-14 MED ORDER — POTASSIUM CHLORIDE CRYS ER 20 MEQ PO TBCR: 10.0000 meq | EXTENDED_RELEASE_TABLET | Freq: Every day | ORAL | Status: DC

## 2014-02-14 MED ORDER — FUROSEMIDE 40 MG PO TABS: 40.0000 mg | ORAL_TABLET | Freq: Every day | ORAL | Status: DC

## 2014-02-14 NOTE — Telephone Encounter (Signed)
pt notified about lab results and to decrease lasix to 40 mg daily and to decrease K+ to 10 meq daily, bmet 6/26 when she comes in for CVRR appt. Pt will get x-ray one day next week. Went over all instructions again x 3 w/pt since she seemed confused, pt did verbalize understanding.

## 2014-02-19 ENCOUNTER — Ambulatory Visit
Admission: RE | Admit: 2014-02-19 | Discharge: 2014-02-19 | Disposition: A | Discharge status: Home / Self Care | Payer: Medicare Other | Source: Ambulatory Visit | Attending: Physician Assistant | Admitting: Physician Assistant

## 2014-02-19 DIAGNOSIS — J90 Pleural effusion, not elsewhere classified: Secondary | ICD-10-CM

## 2014-02-19 DIAGNOSIS — I5032 Chronic diastolic (congestive) heart failure: Secondary | ICD-10-CM

## 2014-02-20 ENCOUNTER — Telehealth: Payer: Self-pay | Admitting: *Deleted

## 2014-02-20 DIAGNOSIS — R0602 Shortness of breath: Secondary | ICD-10-CM

## 2014-02-20 DIAGNOSIS — I5033 Acute on chronic diastolic (congestive) heart failure: Secondary | ICD-10-CM

## 2014-02-20 MED ORDER — POTASSIUM CHLORIDE CRYS ER 10 MEQ PO TBCR: 10.0000 meq | EXTENDED_RELEASE_TABLET | Freq: Every day | ORAL | Status: DC

## 2014-02-20 NOTE — Telephone Encounter (Signed)
see phone note from 6/10. Pt states she accidentally threw away her blister pak that her K+ was in started taking otc 550 mg K+. She said she cannot get Rx K+ until thursday. I looked up what 550 mg = to meq, which = 2.53 meq K+, I said can take 1 tablet today and tomorrow and to make sure to get the K+ 10 meq Rx Thursday and to make sure she stops the OTC K+. Pt verbalized understanding to all instructions. I sent in new Rx for K+ 10 meq daily for the pt.

## 2014-02-22 ENCOUNTER — Ambulatory Visit (INDEPENDENT_AMBULATORY_CARE_PROVIDER_SITE_OTHER): Payer: Medicare Other | Admitting: Surgery

## 2014-02-22 ENCOUNTER — Encounter (INDEPENDENT_AMBULATORY_CARE_PROVIDER_SITE_OTHER): Payer: Self-pay | Admitting: Surgery

## 2014-02-22 VITALS — BP 136/80 | HR 80 | Temp 97.9°F | Ht 67.0 in | Wt 188.0 lb

## 2014-02-22 DIAGNOSIS — K56609 Unspecified intestinal obstruction, unspecified as to partial versus complete obstruction: Secondary | ICD-10-CM

## 2014-02-22 DIAGNOSIS — K6389 Other specified diseases of intestine: Secondary | ICD-10-CM

## 2014-02-22 DIAGNOSIS — K56600 Partial intestinal obstruction, unspecified as to cause: Secondary | ICD-10-CM

## 2014-02-22 DIAGNOSIS — K639 Disease of intestine, unspecified: Secondary | ICD-10-CM

## 2014-02-22 NOTE — Patient Instructions (Signed)
Please consider the recommendations that we have given you today:  Continue diuretics and close followup with her cardiologist/internist concerning her chronic heart failure.  Control your diarrhea better.  Increase Imodium to 22 pills at bedtime.  Consider increasing from 1 to 2 pills before meals to slow diarrhea down.  Consider switching from Citrucel to flaxseed to find a less flatulent fiber alternative.  Consider small or more frequent meals to avoid dumping when you eat.  See the Handout(s) we have given you.  Please call our office at [phone] if you wish to schedule surgery or if you have further questions / concerns.   GETTING TO GOOD BOWEL HEALTH. Irregular bowel habits such as constipation and diarrhea can lead to many problems over time.  Having one soft bowel movement a day is the most important way to prevent further problems.  The anorectal canal is designed to handle stretching and feces to safely manage our ability to get rid of solid waste (feces, poop, stool) out of our body.  BUT, hard constipated stools can act like ripping concrete bricks and diarrhea can be a burning fire to this very sensitive area of our body, causing inflamed hemorrhoids, anal fissures, increasing risk is perirectal abscesses, abdominal pain/bloating, an making irritable bowel worse.     The goal: ONE SOFT BOWEL MOVEMENT A DAY!  To have soft, regular bowel movements:    Drink at least 8 tall glasses of water a day.     Take plenty of fiber.  Fiber is the undigested part of plant food that passes into the colon, acting s "natures broom" to encourage bowel motility and movement.  Fiber can absorb and hold large amounts of water. This results in a larger, bulkier stool, which is soft and easier to pass. Work gradually over several weeks up to 6 servings a day of fiber (25g a day even more if needed) in the form of: o Vegetables -- Root (potatoes, carrots, turnips), leafy green (lettuce, salad greens,  celery, spinach), or cooked high residue (cabbage, broccoli, etc) o Fruit -- Fresh (unpeeled skin & pulp), Dried (prunes, apricots, cherries, etc ),  or stewed ( applesauce)  o Whole grain breads, pasta, etc (whole wheat)  o Bran cereals    Bulking Agents -- This type of water-retaining fiber generally is easily obtained each day by one of the following:  o Psyllium bran -- The psyllium plant is remarkable because its ground seeds can retain so much water. This product is available as Metamucil, Konsyl, Effersyllium, Per Diem Fiber, or the less expensive generic preparation in drug and health food stores. Although labeled a laxative, it really is not a laxative.  o Methylcellulose -- This is another fiber derived from wood which also retains water. It is available as Citrucel. o Polyethylene Glycol - and "artificial" fiber commonly called Miralax or Glycolax.  It is helpful for people with gassy or bloated feelings with regular fiber o Flax Seed - a less gassy fiber than psyllium   No reading or other relaxing activity while on the toilet. If bowel movements take longer than 5 minutes, you are too constipated   AVOID CONSTIPATION.  High fiber and water intake usually takes care of this.  Sometimes a laxative is needed to stimulate more frequent bowel movements, but    Laxatives are not a good long-term solution as it can wear the colon out. o Osmotics (Milk of Magnesia, Fleets phosphosoda, Magnesium citrate, MiraLax, GoLytely) are safer than  o  Stimulants (Senokot, Castor Oil, Dulcolax, Ex Lax)    o Do not take laxatives for more than 7days in a row.    IF SEVERELY CONSTIPATED, try a Bowel Retraining Program: o Do not use laxatives.  o Eat a diet high in roughage, such as bran cereals and leafy vegetables.  o Drink six (6) ounces of prune or apricot juice each morning.  o Eat two (2) large servings of stewed fruit each day.  o Take one (1) heaping tablespoon of a psyllium-based bulking agent  twice a day. Use sugar-free sweetener when possible to avoid excessive calories.  o Eat a normal breakfast.  o Set aside 15 minutes after breakfast to sit on the toilet, but do not strain to have a bowel movement.  o If you do not have a bowel movement by the third day, use an enema and repeat the above steps.    Controlling diarrhea o Switch to liquids and simpler foods for a few days to avoid stressing your intestines further. o Avoid dairy products (especially milk & ice cream) for a short time.  The intestines often can lose the ability to digest lactose when stressed. o Avoid foods that cause gassiness or bloating.  Typical foods include beans and other legumes, cabbage, broccoli, and dairy foods.  Every person has some sensitivity to other foods, so listen to our body and avoid those foods that trigger problems for you. o Adding fiber (FLAX SEED) gradually can help thicken stools by absorbing excess fluid and retrain the intestines to act more normally.  Slowly increase the dose over a few weeks.  Too much fiber too soon can backfire and cause cramping & bloating. o Probiotics (such as active yogurt, Align, etc) may help repopulate the intestines and colon with normal bacteria and calm down a sensitive digestive tract.  Most studies show it to be of mild help, though, and such products can be costly. o Medicines:   Bismuth subsalicylate (ex. Kayopectate, Pepto Bismol) every 30 minutes for up to 6 doses can help control diarrhea.  Avoid if pregnant.   Loperamide (Immodium) can slow down diarrhea.  Start with two tablets (4mg  total) first and then try one tablet every 6 hours.  Avoid if you are having fevers or severe pain.  If you are not better or start feeling worse, stop all medicines and call your doctor for advice o Call your doctor if you are getting worse or not better.  Sometimes further testing (cultures, endoscopy, X-ray studies, bloodwork, etc) may be needed to help diagnose and treat  the cause of the diarrhea.

## 2014-02-22 NOTE — Progress Notes (Signed)
Subjective:     Patient ID: Yolanda Spears, female   DOB: 1934-11-19, 79 y.o.   MRN: [id number]  HPI   Note: This dictation was prepared with Dragon/digital dictation along with Smartphrase technology. Any transcriptional errors that result from this process are unintentional.       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  She is here today by herself.  She is still having loose BMs.  Usually has multiple bowel movements after every meal, 6-8 bowel movements a day.  No bright red blood. She is taking iron but only once a day.  She takes Imodium about 3 times a day.  Usually does not wake up at night.  He is eating pretty much anything she wants.  Feels rather gassy on the Citrucel.  Did not like the taste of Metamucil.   Does not feel sick, nauseated or bloated.     She thinks that the incision has healed over.   No fevers or chills.  When I saw her last time, I was worried she was developing edema.  Recheck labs.  No dehydration.  I recommend she restart her Lasix. I gave orders to go back to the skilled nursing facility.   She does not recall getting it until she went home he with worsening edema.  She saw her cardiologist.  They increase  to twice a day.  She had much improved diuresis and felt better.  Her creatinine increased though so she is back down to once a day.  She feels that is better.  Patient Active Problem List   Diagnosis Date Noted  . Internal hemorrhoids with irritation 01/04/2014  . 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 Encounter for therapeutic drug monitoring 10/05/2013  . Sleep apnea 03/29/2013  . History of adenomatous polyp of colon 04/07/2012  . Orthostatic lightheadedness 11/27/2011  . Partial small bowel obstruction - recurrent - s/p lysis of adhesions 12/28/2013 09/14/2011  . Vertigo 08/10/2011  . Hypercholesterolemia 08/10/2011  . TIA (transient ischemic attack) 04/28/2011  . Benign hypertensive heart disease without heart failure 01/09/2011  . First degree AV block 11/26/2010  . Pacemaker- Dual-chamber-mdt 11/26/2010  . Tachycardia-bradycardia syndrome 11/26/2010  . Atrial fibrillation 11/24/2010    Past Medical History  Diagnosis Date  . Pacemaker -Medtronic   . Hypertension   . Peripheral neuropathy   . High cholesterol   . Heart murmur   . Shortness of breath on exertion   . Atrial fibrillation   . History of radiation therapy   . Partial bowel obstruction JAN/2013    recurrent  . Colon polyp 2011  . Osteopenia 02/2013    T score -1.4 FRAX 11%/2.1%  . Stroke     "small stroke & several TIA's occasional visual problems / and occasional speach  problems  . Arthritis   . Personal history of skin cancer   . Urinary leakage   . Hx: UTI (urinary tract infection)   . Sleep apnea     unaable to tolerate mask   . Endometrial cancer 2006    Stage IIIc  s/p radiation  . Breast cancer 1999    LOBULAR CARCINOMA IN SITU...  . Ringing in ears   . Orthostatic lightheadedness 11/27/2011  . Small bowel obstruction due to adhesions 12/28/2013  . Hx of echocardiogram     Echo (02/2014):  EF 55-60%, no RWMA, mild AI, mild MR, mild LAE,  PASP 44 mmHg    Past Surgical History  Procedure Laterality Date  . Lymph node resection  2007  . Cholecystectomy  1990's  . Mastectomy  1999    bilaterally w/lymph node dissection  . Abdominal hysterectomy  2006    BSO  . Oophorectomy      BSO  . Exploratory laparotomy  2007    remove abdominal cyst  . Insert / replace / remove pacemaker  2007    initial placement  2007 / REPLACED 2014     . Laparoscopic lysis intestinal adhesions  12/28/2013  . Abdominal adhesion surgery  12/28/2013  . Small intestine surgery  12/28/2013  . Laparoscopic lysis of adhesions N/A 12/28/2013    Procedure: LAPAROSCOPIC LYSIS OF ADHESIONS converted OPEN SMALL BOWEL;  Surgeon: Adin Hector, MD;  Location: [facility];  Service: General;  Laterality: N/A;    History   Social History  . Marital Status: Widowed    Spouse Name: N/A    Number of Children: 2  . Years of Education: N/A   Occupational History  . Retired    Social History Main Topics  . Smoking status: Former Smoker -- 1.00 packs/day for 40 years    Types: Cigarettes    Quit date: 11/26/1998  . Smokeless tobacco: Never Used  . Alcohol Use: No     Comment: 09/14/11 "last drink ~ 08/31/2009"  . Drug Use: No  . Sexual Activity: No   Other Topics Concern  . Not on file   Social History Narrative   Widowed mother of 2    Family History  Problem Relation Age of Onset  . Cancer Mother 41    UTERINE  . Breast cancer Mother 34  . Hypertension Mother   . Uterine cancer Mother   . Cancer Father     PROSTATE  . Hypertension Father     Current Outpatient Prescriptions  Medication Sig Dispense Refill  . aspirin 81 MG tablet Take 81 mg by mouth daily.      . calcium carbonate (TUMS - DOSED IN MG ELEMENTAL CALCIUM) 500 MG chewable tablet Chew 1 tablet by mouth daily.      . Cholecalciferol (VITAMIN D-3) 5000 UNITS TABS Take 1 tablet by mouth 4 (four) times a week. Takes on Saturday, Sunday, Tuesday and Thursday      . ezetimibe (ZETIA)  10 MG tablet Take 5 mg by mouth every evening. Takes 1/2 tablet      . Ferrous Sulfate (IRON) 325 (65 FE) MG TABS Take 1 tablet by mouth at bedtime.      . furosemide (LASIX) 40 MG tablet Take 1 tablet (40 mg total) by mouth daily.      . hydrALAZINE (APRESOLINE) 25 MG tablet Take 25 mg by mouth 3 (three) times daily.       Marland Kitchen lisinopril (PRINIVIL,ZESTRIL) 10 MG tablet  Take 10 mg by mouth 2 (two) times daily.      Marland Kitchen loperamide (IMODIUM A-D) 2 MG tablet Take 1 tablet (2 mg total) by mouth 2 (two) times daily.      . metoprolol succinate (TOPROL-XL) 100 MG 24 hr tablet Take 100 mg by mouth every morning. Take with or immediately following a meal.      . potassium chloride SA (K-DUR,KLOR-CON) 10 MEQ tablet Take 1 tablet (10 mEq total) by mouth daily.  30 tablet  11  . pregabalin (LYRICA) 75 MG capsule Take 75 mg by mouth 2 (two) times daily.      . propafenone (RYTHMOL) 225 MG tablet Take 225 mg by mouth every 8 (eight) hours.      . saccharomyces boulardii (FLORASTOR) 250 MG capsule Take 250 mg by mouth daily.      Marland Kitchen warfarin (COUMADIN) 2.5 MG tablet Take 1 tablet by mouth daily or as directed  30 tablet  5   No current facility-administered medications for this visit.     Allergies  Allergen Reactions  . Other Hives    Contrast dye   . Penicillins Itching  . Shellfish-Derived Products Nausea And Vomiting  . Statins     Myalgias,elevated LFT studies  . Iohexol Hives and Itching    Itching in eyes, one hive on face.    BP 136/80  Pulse 80  Temp(Src) 97.9 F (36.6 C)  Ht 5\' 7"  (1.702 m)  Wt 188 lb (85.276 kg)  BMI 29.44 kg/m2  No results found.   Review of Systems  Constitutional: Negative for fever, chills and diaphoresis.  HENT: Negative for ear pain, sore throat and trouble swallowing.   Eyes: Negative for photophobia and visual disturbance.  Respiratory: Negative for cough and choking.   Cardiovascular: Negative for chest pain and palpitations.  Gastrointestinal: Negative  for nausea, vomiting, abdominal pain, diarrhea, constipation, anal bleeding and rectal pain.  Genitourinary: Negative for dysuria, frequency and difficulty urinating.  Musculoskeletal: Negative for gait problem and myalgias.  Skin: Negative for color change, pallor and rash.  Neurological: Negative for dizziness, speech difficulty, weakness and numbness.  Hematological: Negative for adenopathy.  Psychiatric/Behavioral: Negative for confusion and agitation. The patient is not nervous/anxious.        Objective:   Physical Exam  Constitutional: She is oriented to person, place, and time. She appears well-developed and well-nourished. No distress.  HENT:  Head: Normocephalic.  Mouth/Throat: Oropharynx is clear and moist. No oropharyngeal exudate.  Eyes: Conjunctivae and EOM are normal. Pupils are equal, round, and reactive to light. No scleral icterus.  Neck: Normal range of motion. No tracheal deviation present.  Cardiovascular: Normal rate and intact distal pulses.   Pulmonary/Chest: Effort normal. No respiratory distress. She exhibits no tenderness.  Abdominal: Soft. She exhibits no distension. There is no tenderness. Hernia confirmed negative in the ventral area, confirmed negative in the right inguinal area and confirmed negative in the left inguinal area.    Incisions clean with normal healing ridges.  No hernias  Genitourinary: No vaginal discharge found.  Musculoskeletal: Normal range of motion. She exhibits edema. She exhibits no tenderness.  Lymphadenopathy:       Right: No inguinal adenopathy present.       Left: No inguinal adenopathy present.  Neurological: She is alert and oriented to person, place, and time. No cranial nerve deficit. She exhibits normal muscle tone. Coordination normal.  Skin: Skin is warm and dry. No rash noted. She is not diaphoretic.  Psychiatric: She has a normal mood and affect. Her behavior is normal.       Assessment:     Struggling with  postoperative diarrhea.  Improved overall.  Certainly not having the intermittent cramping and bloating as before surgery     Plan:     I am glad she is better.  She does not recall my recommendation for Lasix last visit but in the end she ultimately started taking and is better now.  I think she needs to get her diarrhea under better control.  I recommended she increase the Imodium.  Try 4 mg at night.  Consider another 4 mg dose during the day.  Consider increasing iron to more than just once a day.  I recommend she switch her fiber bowel regimen.  Inset of switching to flaxseed once a day.  Should help thicken the stools.  Last flatulence and then Citrucel.  If she is not feeling better by August, reevaluate with [facility] gastroenterology.  See if she should start a PPI or other more aggressive antidiarrheal regimen.  Return to clinic in one month to make sure she is improve.  I strongly recommend she call me if things are not getting better within the next week or so.  Certainly call sooner if worse.  I went over directions and instructions many times to reinforce them.  Gave her handouts with explicit instructions since she seemed to be needing repeated directions.  She expressed understanding and appreciation

## 2014-02-27 ENCOUNTER — Encounter: Payer: Medicare Other | Admitting: Gynecology

## 2014-03-02 ENCOUNTER — Ambulatory Visit (INDEPENDENT_AMBULATORY_CARE_PROVIDER_SITE_OTHER): Payer: Medicare Other | Admitting: Pharmacist

## 2014-03-02 ENCOUNTER — Other Ambulatory Visit (INDEPENDENT_AMBULATORY_CARE_PROVIDER_SITE_OTHER): Payer: Medicare Other

## 2014-03-02 DIAGNOSIS — Z5181 Encounter for therapeutic drug level monitoring: Secondary | ICD-10-CM

## 2014-03-02 DIAGNOSIS — I5033 Acute on chronic diastolic (congestive) heart failure: Secondary | ICD-10-CM

## 2014-03-02 DIAGNOSIS — I4891 Unspecified atrial fibrillation: Secondary | ICD-10-CM

## 2014-03-02 LAB — BASIC METABOLIC PANEL
BUN: 15 mg/dL (ref 6–23)
CO2: 27 mEq/L (ref 19–32)
Calcium: 9 mg/dL (ref 8.4–10.5)
Chloride: 102 mEq/L (ref 96–112)
Creatinine, Ser: 1.3 mg/dL — ABNORMAL HIGH (ref 0.4–1.2)
GFR: 41.47 mL/min — AB (ref >60.00)
Glucose, Bld: 130 mg/dL — ABNORMAL HIGH (ref 70–99)
POTASSIUM: 3.9 meq/L (ref 3.5–5.1)
SODIUM: 138 meq/L (ref 135–145)

## 2014-03-02 LAB — POCT INR: INR: 3.1

## 2014-03-05 ENCOUNTER — Telehealth: Payer: Self-pay | Admitting: *Deleted

## 2014-03-05 NOTE — Telephone Encounter (Signed)
pt notified about lab results with verbal understanding  

## 2014-03-08 ENCOUNTER — Other Ambulatory Visit: Payer: Self-pay | Admitting: Cardiology

## 2014-03-23 ENCOUNTER — Encounter: Payer: Self-pay | Admitting: Internal Medicine

## 2014-03-23 ENCOUNTER — Ambulatory Visit (INDEPENDENT_AMBULATORY_CARE_PROVIDER_SITE_OTHER): Payer: Medicare Other | Admitting: Internal Medicine

## 2014-03-23 ENCOUNTER — Ambulatory Visit (INDEPENDENT_AMBULATORY_CARE_PROVIDER_SITE_OTHER): Payer: Medicare Other | Admitting: Pharmacist

## 2014-03-23 VITALS — BP 120/69 | HR 83 | Ht 67.0 in | Wt 186.0 lb

## 2014-03-23 DIAGNOSIS — I4891 Unspecified atrial fibrillation: Secondary | ICD-10-CM

## 2014-03-23 DIAGNOSIS — Z950 Presence of cardiac pacemaker: Secondary | ICD-10-CM

## 2014-03-23 DIAGNOSIS — I440 Atrioventricular block, first degree: Secondary | ICD-10-CM

## 2014-03-23 DIAGNOSIS — I480 Paroxysmal atrial fibrillation: Secondary | ICD-10-CM

## 2014-03-23 DIAGNOSIS — Z5181 Encounter for therapeutic drug level monitoring: Secondary | ICD-10-CM

## 2014-03-23 DIAGNOSIS — G459 Transient cerebral ischemic attack, unspecified: Secondary | ICD-10-CM

## 2014-03-23 DIAGNOSIS — I495 Sick sinus syndrome: Secondary | ICD-10-CM

## 2014-03-23 LAB — MDC_IDC_ENUM_SESS_TYPE_INCLINIC
Battery Remaining Longevity: 118 mo
Battery Voltage: 2.78 V
Brady Statistic AP VP Percent: 85 %
Brady Statistic AP VS Percent: 0 %
Brady Statistic AS VP Percent: 14 %
Lead Channel Impedance Value: 589 Ohm
Lead Channel Pacing Threshold Amplitude: 0.75 V
Lead Channel Pacing Threshold Amplitude: 0.75 V
Lead Channel Pacing Threshold Pulse Width: 0.4 ms
Lead Channel Pacing Threshold Pulse Width: 0.4 ms
Lead Channel Sensing Intrinsic Amplitude: 11.2 mV
Lead Channel Setting Pacing Amplitude: 2.5 V
Lead Channel Setting Pacing Pulse Width: 0.4 ms
Lead Channel Setting Sensing Sensitivity: 2.8 mV
MDC IDC MSMT BATTERY IMPEDANCE: 156 Ohm
MDC IDC MSMT LEADCHNL RA IMPEDANCE VALUE: 521 Ohm
MDC IDC MSMT LEADCHNL RA SENSING INTR AMPL: 2 mV
MDC IDC SESS DTM: 20150717114353
MDC IDC SET LEADCHNL RA PACING AMPLITUDE: 2 V
MDC IDC STAT BRADY AS VS PERCENT: 0 %

## 2014-03-23 LAB — POCT INR: INR: 3.4

## 2014-03-23 NOTE — Progress Notes (Signed)
Patient Care Team: Thressa Sheller, MD as PCP - General Yolanda Morning, MD as Consulting Physician (Gynecologic Oncology) Darlin Coco, MD as Consulting Physician (Cardiology) Jerene Bears, MD as Consulting Physician (Gastroenterology) Adin Hector, MD as Consulting Physician (General Surgery)   HPI  Yolanda Spears is a 79 y.o. female seen in followup pacemaker implanted 2007 with generator replacement 2013 for tachybradycardia syndrome with paroxysmal atrial fibrillation. She has been treated with Rythmol metoprolol and warfarin. She had a spell in August 2013 where she had transient difficulty speaking . It cleared spontaneously. Aspirin was added   Denies change in functional status    Recent GI surgery which has been complicated by persistent diarrhea.   Past Medical History  Diagnosis Date  . Pacemaker -Medtronic   . Hypertension   . Peripheral neuropathy   . High cholesterol   . Heart murmur   . Shortness of breath on exertion   . Atrial fibrillation   . History of radiation therapy   . Partial bowel obstruction JAN/2013    recurrent  . Colon polyp 2011  . Osteopenia 02/2013    T score -1.4 FRAX 11%/2.1%  . Stroke     "small stroke & several TIA's occasional visual problems / and occasional speach problems  . Arthritis   . Personal history of skin cancer   . Urinary leakage   . Hx: UTI (urinary tract infection)   . Sleep apnea     unaable to tolerate mask   . Endometrial cancer 2006    Stage IIIc  s/p radiation  . Breast cancer 1999    LOBULAR CARCINOMA IN SITU...  . Ringing in ears   . Orthostatic lightheadedness 11/27/2011  . Small bowel obstruction due to adhesions 12/28/2013  . Hx of echocardiogram     Echo (02/2014):  EF 55-60%, no RWMA, mild AI, mild MR, mild LAE, PASP 44 mmHg    Past Surgical History  Procedure Laterality Date  . Lymph node resection  2007  . Cholecystectomy  1990's  . Mastectomy  1999    bilaterally w/lymph node dissection   . Abdominal hysterectomy  2006    BSO  . Oophorectomy      BSO  . Exploratory laparotomy  2007    remove abdominal cyst  . Insert / replace / remove pacemaker  2007    initial placement  2007 / REPLACED 2014     . Laparoscopic lysis intestinal adhesions  12/28/2013  . Abdominal adhesion surgery  12/28/2013  . Small intestine surgery  12/28/2013  . Laparoscopic lysis of adhesions N/A 12/28/2013    Procedure: LAPAROSCOPIC LYSIS OF ADHESIONS converted OPEN SMALL BOWEL;  Surgeon: Adin Hector, MD;  Location: [facility];  Service: General;  Laterality: N/A;    Current Outpatient Prescriptions  Medication Sig Dispense Refill  . aspirin 81 MG tablet Take 81 mg by mouth daily.      . calcium carbonate (TUMS - DOSED IN MG ELEMENTAL CALCIUM) 500 MG chewable tablet Chew 1 tablet by mouth daily.      . Cholecalciferol (VITAMIN D-3) 5000 UNITS TABS Take 1 tablet by mouth 4 (four) times a week. Takes on Saturday, Sunday, Tuesday and Thursday      . ezetimibe (ZETIA) 10 MG tablet Take 5 mg by mouth every evening. Takes 1/2 tablet      . Ferrous Sulfate (IRON) 325 (65 FE) MG TABS Take 1 tablet by mouth at bedtime.      . furosemide (  LASIX) 40 MG tablet Take 1 tablet (40 mg total) by mouth daily.      . hydrALAZINE (APRESOLINE) 25 MG tablet Take 25 mg by mouth 3 (three) times daily.       Marland Kitchen lisinopril (PRINIVIL,ZESTRIL) 10 MG tablet Take 10 mg by mouth 2 (two) times daily.      Marland Kitchen loperamide (IMODIUM A-D) 2 MG tablet Take 1 tablet (2 mg total) by mouth 2 (two) times daily.      . metoprolol succinate (TOPROL-XL) 100 MG 24 hr tablet Take 100 mg by mouth every Spears. Take with or immediately following a meal.      . potassium chloride SA (K-DUR,KLOR-CON) 10 MEQ tablet Take 1 tablet (10 mEq total) by mouth daily.  30 tablet  11  . pregabalin (LYRICA) 75 MG capsule Take 75 mg by mouth 2 (two) times daily.      . propafenone (RYTHMOL) 225 MG tablet Take 225 mg by mouth every 8 (eight) hours.      Marland Kitchen warfarin  (COUMADIN) 2.5 MG tablet Take 1 tablet by mouth daily or as directed  30 tablet  5   No current facility-administered medications for this visit.    Allergies  Allergen Reactions  . Other Hives    Contrast dye   . Penicillins Itching  . Shellfish-Derived Products Nausea And Vomiting  . Statins     Myalgias,elevated LFT studies  . Iohexol Hives and Itching    Itching in eyes, one hive on face.    Review of Systems negative except from HPI and PMH  Physical Exam BP 120/69  Pulse 83  Ht 5\' 7"  (1.702 m)  Wt 186 lb (84.369 kg)  BMI 29.12 kg/m2 Well developed and well nourished in no acute distress HENT normal E scleral and icterus clear Neck Supple JVP flat; carotids brisk and full Clear to ausculation Device pocket well healed; without hematoma or erythema.  There is no tethering Regular rate and rhythm, no murmurs gallops or rub Soft with active bowel sounds No clubbing cyanosis Trace and non-pitting Edema Alert and oriented, grossly normal motor and sensory function Skin Warm and Dry  eCG demonstrates AV pacing at 83      Assessment and  Plan  Sinus node dysfunction  First degree AV block  Pacemaker-Medtronic The patient's device was interrogated.  The information was reviewed. No changes were made in the programming.    Atrial fibrillation  Prior stroke  Pacemaker function was normal. I will ask her to followup with Dr. Mare Ferrari as to whether alternative to Coumadin are worth considering. I have also asked her to follow up with him regarding aspirin use adjunctively with warfarin which was shown in SPORTIF trial to be RISK Augmenting but not of added benefit. Recommendations most recent guidelines are to push INR from 2-3>>> 2.5-3.5

## 2014-03-23 NOTE — Patient Instructions (Addendum)
Your physician recommends that you continue on your current medications as directed. Please refer to the Current Medication list given to you today.  Remote monitoring is used to monitor your Pacemaker of ICD from home. This monitoring reduces the number of office visits required to check your device to one time per year. It allows [place] to keep an eye on the functioning of your device to ensure it is working properly. You are scheduled for a device check from home on 06/25/14. You may send your transmission at any time that day. If you have a wireless device, the transmission will be sent automatically. After your physician reviews your transmission, you will receive a postcard with your next transmission date.  Your physician recommends that you schedule a follow-up appointment in: 12 months with Dr. Caryl Comes

## 2014-03-26 ENCOUNTER — Ambulatory Visit (INDEPENDENT_AMBULATORY_CARE_PROVIDER_SITE_OTHER): Payer: Medicare Other | Admitting: Podiatry

## 2014-03-26 DIAGNOSIS — M79673 Pain in unspecified foot: Secondary | ICD-10-CM

## 2014-03-26 DIAGNOSIS — B351 Tinea unguium: Secondary | ICD-10-CM

## 2014-03-26 DIAGNOSIS — M79609 Pain in unspecified limb: Secondary | ICD-10-CM

## 2014-03-26 NOTE — Progress Notes (Signed)
Patient ID: Yolanda Spears, female   DOB: Aug 08, 1935, 79 y.o.   MRN: [id number]  Subjective: Orientated x3 female presents complaining of painful toenails  Objective: Hypertrophic, brittle, discolored toenails 6-10  Assessment: Symptomatic onychomycoses 6-10  Plan: Nails x10 debrided without a bleeding  Reappoint x3 months

## 2014-03-30 ENCOUNTER — Ambulatory Visit (INDEPENDENT_AMBULATORY_CARE_PROVIDER_SITE_OTHER): Payer: Medicare Other | Admitting: Cardiology

## 2014-03-30 ENCOUNTER — Encounter: Payer: Self-pay | Admitting: Cardiology

## 2014-03-30 VITALS — BP 128/68 | HR 86 | Ht 67.0 in | Wt 184.0 lb

## 2014-03-30 DIAGNOSIS — I119 Hypertensive heart disease without heart failure: Secondary | ICD-10-CM

## 2014-03-30 DIAGNOSIS — I4891 Unspecified atrial fibrillation: Secondary | ICD-10-CM

## 2014-03-30 DIAGNOSIS — G458 Other transient cerebral ischemic attacks and related syndromes: Secondary | ICD-10-CM

## 2014-03-30 DIAGNOSIS — I5032 Chronic diastolic (congestive) heart failure: Secondary | ICD-10-CM

## 2014-03-30 DIAGNOSIS — G459 Transient cerebral ischemic attack, unspecified: Secondary | ICD-10-CM

## 2014-03-30 DIAGNOSIS — I480 Paroxysmal atrial fibrillation: Secondary | ICD-10-CM | POA: Insufficient documentation

## 2014-03-30 DIAGNOSIS — R197 Diarrhea, unspecified: Secondary | ICD-10-CM

## 2014-03-30 NOTE — Progress Notes (Signed)
Yolanda Spears Date of Birth:  11-Oct-1934 [place] [address] [place], [place]  [address] [phone]        Fax   [phone]   History of Present Illness: This pleasant 79 year old woman is seen for a scheduled followup office visit. The patient has a past history of tachybradycardia syndrome and had a pacemaker implanted in 2007. She had a pacemaker generator change by Dr. Caryl Comes on 03/23/12. She is on chronic Coumadin anticoagulation.  The patient has not been having any lightheadedness or syncope. She has not been aware of any racing of her heart. No palpitations. He does have chronic positional vertigo when she changes positions or rolls over in bed. She has a history of peripheral neuropathy and sees Dr. Krista Blue who has her on Lyrica. She sleeps poorly. She has been told that she has sleep apnea. She tried a CPAP machine years ago but was unable to tolerate it.  The patient underwent abdominal surgery in April 2015 for recurrent small bowel obstruction.  Following surgery she went to a nursing facility.  While there she went into congestive heart failure with severe dyspnea hypoxemia and peripheral edema.  Lasix was adjusted and she improved over the next several weeks.  She feels now that she is back to baseline in terms of her breathing.  She is having chronic diarrhea.   Current Outpatient Prescriptions  Medication Sig Dispense Refill  . calcium carbonate (TUMS - DOSED IN MG ELEMENTAL CALCIUM) 500 MG chewable tablet Chew 1 tablet by mouth daily.      . Cholecalciferol (VITAMIN D-3) 5000 UNITS TABS Take 1 tablet by mouth 4 (four) times a week. Takes on Saturday, Sunday, Tuesday and Thursday      . ezetimibe (ZETIA) 10 MG tablet Take 5 mg by mouth every evening. Takes 1/2 tablet      . Ferrous Sulfate (IRON) 325 (65 FE) MG TABS Take 1 tablet by mouth at bedtime.      . furosemide (LASIX) 40 MG tablet Take 1 tablet (40 mg total) by mouth daily.      Marland Kitchen lisinopril  (PRINIVIL,ZESTRIL) 10 MG tablet Take 10 mg by mouth 2 (two) times daily.      Marland Kitchen loperamide (IMODIUM A-D) 2 MG tablet Take 1 tablet (2 mg total) by mouth 2 (two) times daily.      . metoprolol succinate (TOPROL-XL) 100 MG 24 hr tablet Take 100 mg by mouth every morning. Take with or immediately following a meal.      . potassium chloride SA (K-DUR,KLOR-CON) 10 MEQ tablet Take 1 tablet (10 mEq total) by mouth daily.  30 tablet  11  . pregabalin (LYRICA) 75 MG capsule Take 75 mg by mouth 2 (two) times daily.      . propafenone (RYTHMOL) 225 MG tablet Take 225 mg by mouth every 8 (eight) hours.      Marland Kitchen sulfamethoxazole-trimethoprim (BACTRIM,SEPTRA) 400-80 MG per tablet Take 1 tablet by mouth 2 (two) times daily.       Marland Kitchen warfarin (COUMADIN) 2.5 MG tablet Take 1 tablet by mouth daily or as directed  30 tablet  5  . hydrALAZINE (APRESOLINE) 25 MG tablet Take 25 mg by mouth 3 (three) times daily.        No current facility-administered medications for this visit.    Allergies  Allergen Reactions  . Other Hives    Contrast dye   . Penicillins Itching  . Shellfish-Derived Products Nausea And  Vomiting  . Statins     Myalgias,elevated LFT studies  . Iohexol Hives and Itching    Itching in eyes, one hive on face.    Patient Active Problem List   Diagnosis Date Noted  . Atrial fibrillation 11/24/2010    Priority: Medium  . Diarrhea 03/30/2014  . Internal hemorrhoids with irritation 01/04/2014  . 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 Encounter for therapeutic drug monitoring 10/05/2013  . Sleep apnea 03/29/2013  . History of adenomatous polyp of colon 04/07/2012  . Orthostatic lightheadedness 11/27/2011  . Partial small bowel obstruction - recurrent - s/p lysis of adhesions 12/28/2013 09/14/2011  . Vertigo 08/10/2011  . Hypercholesterolemia 08/10/2011  . TIA (transient ischemic attack) 04/28/2011  . Benign hypertensive heart disease  without heart failure 01/09/2011  . First degree AV block 11/26/2010  . Pacemaker- Dual-chamber-mdt 11/26/2010  . Tachycardia-bradycardia syndrome 11/26/2010    History  Smoking status  . Former Smoker -- 1.00 packs/day for 40 years  . Types: Cigarettes  . Quit date: 11/26/1998  Smokeless tobacco  . Never Used    History  Alcohol Use No    Comment: 09/14/11 "last drink ~ 08/31/2009"    Family History  Problem Relation Age of Onset  . Cancer Mother 32    UTERINE  . Breast cancer Mother 50  . Hypertension Mother   . Uterine cancer Mother   . Cancer Father     PROSTATE  . Hypertension Father     Review of Systems: Constitutional: no fever chills diaphoresis or fatigue or change in weight.  Head and neck: no hearing loss, no epistaxis, no photophobia or visual disturbance. Respiratory: No cough, shortness of breath or wheezing. Cardiovascular: No chest pain peripheral edema, palpitations. Gastrointestinal: No abdominal distention, no abdominal pain, no change in bowel habits hematochezia or melena. Genitourinary: No dysuria, no frequency, no urgency, no nocturia. Musculoskeletal:No arthralgias, no back pain, no gait disturbance or myalgias. Neurological: No dizziness, no headaches, no numbness, no seizures, no syncope, no weakness, no tremors. Hematologic: No lymphadenopathy, no easy bruising. Psychiatric: No confusion, no hallucinations, no sleep disturbance.    Physical Exam: Filed Vitals:   03/30/14 1436  BP: 128/68  Pulse: 86   The general appearance reveals a well-developed well-nourished woman in no distress.The head and neck exam reveals pupils equal and reactive.  Extraocular movements are full.  There is no scleral icterus.  The mouth and pharynx are normal.  The neck is supple.  The carotids reveal no bruits.  The jugular venous pressure is normal.  The  thyroid is not enlarged.  There is no lymphadenopathy.  The chest is clear to percussion and auscultation.   There are no rales or rhonchi.  Expansion of the chest is symmetrical.  The precordium is quiet.  The first heart sound is normal.  The second heart sound is physiologically split.  There is a grade 2/6 systolic ejection murmur at the base.  There is no abnormal lift or heave.  The abdomen is soft and nontender.  The bowel sounds are normal.  The liver and spleen are not enlarged.  There are no abdominal masses.  There are no abdominal bruits.  Extremities reveal good pedal pulses.  There is no phlebitis or edema.  There is no cyanosis or clubbing.  Strength is normal and symmetrical in all extremities.  There is no lateralizing weakness.  There are no sensory deficits.  The skin is warm and dry.  There is no rash.    Assessment / Plan: 1. paroxysmal atrial fibrillation 2. tachybradycardia syndrome status post pacemaker 3. prior TIA 4. obstructive sleep apnea 5. recent surgery for recurrent small bowel obstruction in April 2015 6. chronic diastolic heart failure.  Echocardiogram 02/05/14 showed ejection fraction 55-60% with mild aortic insufficiency and mild mitral regurgitation 7. diarrhea, chronic  Disposition: Continue same medication except she can stop baby aspirin.  Continue warfarin.  Recheck in 4 months for office visit and EKG.

## 2014-03-30 NOTE — Assessment & Plan Note (Signed)
The patient has not had any further TIA symptoms.  She will continue warfarin.  She does not need to continue her baby aspirin.

## 2014-03-30 NOTE — Assessment & Plan Note (Signed)
Patient is a past history of paroxysmal atrial fibrillation.  She is maintaining normal sinus rhythm on propafenone.  She is on long-term Coumadin.

## 2014-03-30 NOTE — Assessment & Plan Note (Signed)
The patient has had chronic diarrhea.  Her potassium is normal at 4.4 with repletion.  Will continue current repletion

## 2014-03-30 NOTE — Patient Instructions (Signed)
STOP ASPIRIN  Your physician recommends that you schedule a follow-up appointment in: 4 month ov/ekg

## 2014-04-04 ENCOUNTER — Encounter (INDEPENDENT_AMBULATORY_CARE_PROVIDER_SITE_OTHER): Payer: Self-pay | Admitting: Surgery

## 2014-04-04 ENCOUNTER — Ambulatory Visit (INDEPENDENT_AMBULATORY_CARE_PROVIDER_SITE_OTHER): Payer: Medicare Other | Admitting: Surgery

## 2014-04-04 VITALS — BP 98/52 | HR 74 | Temp 97.7°F | Resp 18 | Ht 67.0 in | Wt 181.8 lb

## 2014-04-04 DIAGNOSIS — K6389 Other specified diseases of intestine: Secondary | ICD-10-CM

## 2014-04-04 DIAGNOSIS — K56600 Partial intestinal obstruction, unspecified as to cause: Secondary | ICD-10-CM

## 2014-04-04 DIAGNOSIS — R197 Diarrhea, unspecified: Secondary | ICD-10-CM

## 2014-04-04 DIAGNOSIS — K639 Disease of intestine, unspecified: Secondary | ICD-10-CM

## 2014-04-04 DIAGNOSIS — K56609 Unspecified intestinal obstruction, unspecified as to partial versus complete obstruction: Secondary | ICD-10-CM

## 2014-04-04 DIAGNOSIS — K529 Noninfective gastroenteritis and colitis, unspecified: Secondary | ICD-10-CM | POA: Insufficient documentation

## 2014-04-04 MED ORDER — LOPERAMIDE HCL 2 MG PO TABS: 4.0000 mg | ORAL_TABLET | Freq: Two times a day (BID) | ORAL | Status: DC

## 2014-04-04 NOTE — Progress Notes (Signed)
Subjective:     Patient ID: Yolanda Spears, female   DOB: 1935/01/19, 79 y.o.   MRN: [id number]  HPI   Note: This dictation was prepared with Dragon/digital dictation along with Smartphrase technology. Any transcriptional errors that result from this process are unintentional.       Yolanda Spears  1934/09/26 [id number]  Patient Care Team: Thressa Sheller, MD as PCP - General [name], MD as Consulting Physician (Gynecologic Oncology) Darlin Coco, MD as Consulting Physician (Cardiology) Jerene Bears, MD as Consulting Physician (Gastroenterology) Adin Hector, MD as Consulting Physician (General Surgery)  Procedure (Date: 12/28/2013):  POST-OPERATIVE DIAGNOSIS: recurrent bowel obstructions with possible ileal thickening from prior radiation enteritis   PROCEDURE: Procedure(s):  LAPAROSCOPIC LYSIS OF ADHESIONS x2 hours  OPEN LYSIS OF ADHESIONS X 2 hours  SMALL BOWEL RESECTION (ileum)  SEROSAL REPAIRS X4  ON-Q PLACEMENT  SURGEON: Surgeon(s):  Adin Hector, MD  Shann Medal, MD  Georganna Skeans, MD   This patient returns for surgical re-evaluation.  She arrived 30 minutes late, but we were able to fit her in.  She is here today by herself.  She is still having loose BMs.  Usually has 3 loose BMs a day & then 1-2 small bowel movements with urination, 6-8 bowel movements a day.  Better than 2 months ago but still frustrating.  Does not want to travel for fear of having episodes of uncontrolled diarrhea.  No bright red blood. She is taking iron but only once a day.    She takes Imodium about 3 times a day.  She did not increase in like I recommended.  I recommended trying flaxseed.  She got flaxseed oil.  She has not used it.  She sees many doctors.  Many people are recommended considering gastroenterology.  She is on antibiotics for a recurrent urinary tract infection.  She thinks it has made her diarrhea worse.  She is thinking about seeing Dr. Hilarie Fredrickson again.  She  recalls different instructions on what I wrote down and told her.    Patient Active Problem List   Diagnosis Date Noted  . Diarrhea 03/30/2014  . Paroxysmal atrial fibrillation 03/30/2014  . Internal hemorrhoids with irritation 01/04/2014  . 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 Encounter for therapeutic drug monitoring 10/05/2013  . Sleep apnea 03/29/2013  . History of adenomatous polyp of colon 04/07/2012  . Orthostatic lightheadedness 11/27/2011  . Partial small bowel obstruction - recurrent - s/p lysis of adhesions 12/28/2013 09/14/2011  . Vertigo 08/10/2011  . Hypercholesterolemia 08/10/2011  . TIA (transient ischemic attack) 04/28/2011  . Benign hypertensive heart disease without heart failure 01/09/2011  . First degree AV block 11/26/2010  . Pacemaker- Dual-chamber-mdt 11/26/2010  . Tachycardia-bradycardia syndrome 11/26/2010  . Atrial fibrillation 11/24/2010    Past Medical History  Diagnosis Date  . Pacemaker -[employer]   . Hypertension   . Peripheral neuropathy   . High cholesterol   . Heart murmur   . Shortness of breath on exertion   . Atrial fibrillation   . History of radiation therapy   . Partial bowel obstruction JAN/2013    recurrent  . Colon polyp 2011  . Osteopenia 02/2013    T score -1.4 FRAX 11%/2.1%  . Stroke     "small stroke & several TIA's occasional visual problems / and occasional speach problems  . Arthritis   . Personal history of skin cancer   . Urinary leakage   .  Hx: UTI (urinary tract infection)   . Sleep apnea     unaable to tolerate mask   . Endometrial cancer 2006    Stage IIIc  s/p radiation  . Breast cancer 1999    LOBULAR CARCINOMA IN SITU...  . Ringing in ears   . Orthostatic lightheadedness 11/27/2011  . Small bowel obstruction due to adhesions 12/28/2013  . Hx of echocardiogram     Echo (02/2014):  EF 55-60%, no RWMA, mild AI, mild MR, mild LAE, PASP 44 mmHg     Past Surgical History  Procedure Laterality Date  . Lymph node resection  2007  . Cholecystectomy  1990's  . Mastectomy  1999    bilaterally w/lymph node dissection  . Abdominal hysterectomy  2006    BSO  . Oophorectomy      BSO  . Exploratory laparotomy  2007    remove abdominal cyst  . Insert / replace / remove pacemaker  2007    initial placement  2007 / REPLACED 2014     . Laparoscopic lysis intestinal adhesions  12/28/2013  . Abdominal adhesion surgery  12/28/2013  . Small intestine surgery  12/28/2013  . Laparoscopic lysis of adhesions N/A 12/28/2013    Procedure: LAPAROSCOPIC LYSIS OF ADHESIONS converted OPEN SMALL BOWEL;  Surgeon: Adin Hector, MD;  Location: [facility];  Service: General;  Laterality: N/A;    History   Social History  . Marital Status: Widowed    Spouse Name: N/A    Number of Children: 2  . Years of Education: N/A   Occupational History  . Retired    Social History Main Topics  . Smoking status: Former Smoker -- 1.00 packs/day for 40 years    Types: Cigarettes    Quit date: 11/26/1998  . Smokeless tobacco: Never Used  . Alcohol Use: No     Comment: 09/14/11 "last drink ~ 08/31/2009"  . Drug Use: No  . Sexual Activity: No   Other Topics Concern  . Not on file   Social History Narrative   Widowed mother of 2    Family History  Problem Relation Age of Onset  . Cancer Mother 78    UTERINE  . Breast cancer Mother 69  . Hypertension Mother   . Uterine cancer Mother   . Cancer Father     PROSTATE  . Hypertension Father     Current Outpatient Prescriptions  Medication Sig Dispense Refill  . calcium carbonate (TUMS - DOSED IN MG ELEMENTAL CALCIUM) 500 MG chewable tablet Chew 1 tablet by mouth daily.      . Cholecalciferol (VITAMIN D-3) 5000 UNITS TABS Take 1 tablet by mouth 4 (four) times a week. Takes on Saturday, Sunday, Tuesday and Thursday      . ezetimibe (ZETIA) 10 MG tablet Take 5 mg by mouth every evening. Takes 1/2 tablet       . Ferrous Sulfate (IRON) 325 (65 FE) MG TABS Take 1 tablet by mouth at bedtime.      . furosemide (LASIX) 40 MG tablet Take 1 tablet (40 mg total) by mouth daily.      . hydrALAZINE (APRESOLINE) 25 MG tablet Take 25 mg by mouth 3 (three) times daily.       Marland Kitchen lisinopril (PRINIVIL,ZESTRIL) 10 MG tablet Take 10 mg by mouth 2 (two) times daily.      Marland Kitchen loperamide (IMODIUM A-D) 2 MG tablet Take 1 tablet (2 mg total) by mouth 2 (two) times daily.      Marland Kitchen  metoprolol succinate (TOPROL-XL) 100 MG 24 hr tablet Take 100 mg by mouth every morning. Take with or immediately following a meal.      . potassium chloride SA (K-DUR,KLOR-CON) 10 MEQ tablet Take 1 tablet (10 mEq total) by mouth daily.  30 tablet  11  . pregabalin (LYRICA) 75 MG capsule Take 75 mg by mouth 2 (two) times daily.      . propafenone (RYTHMOL) 225 MG tablet Take 225 mg by mouth every 8 (eight) hours.      Marland Kitchen sulfamethoxazole-trimethoprim (BACTRIM,SEPTRA) 400-80 MG per tablet Take 1 tablet by mouth 2 (two) times daily.       Marland Kitchen warfarin (COUMADIN) 2.5 MG tablet Take 1 tablet by mouth daily or as directed  30 tablet  5   No current facility-administered medications for this visit.     Allergies  Allergen Reactions  . Other Hives    Contrast dye   . Penicillins Itching  . Shellfish-Derived Products Nausea And Vomiting  . Statins     Myalgias,elevated LFT studies  . Iohexol Hives and Itching    Itching in eyes, one hive on face.    BP 98/52  Pulse 74  Temp(Src) 97.7 F (36.5 C) (Oral)  Resp 18  Ht 5\' 7"  (1.702 m)  Wt 181 lb 12.8 oz (82.464 kg)  BMI 28.47 kg/m2  No results found.   Review of Systems  Constitutional: Negative for fever, chills and diaphoresis.  HENT: Negative for ear pain, sore throat and trouble swallowing.   Eyes: Negative for photophobia and visual disturbance.  Respiratory: Negative for cough and choking.   Cardiovascular: Negative for chest pain and palpitations.  Gastrointestinal: Negative for  nausea, vomiting, abdominal pain, diarrhea, constipation, anal bleeding and rectal pain.  Genitourinary: Negative for dysuria, frequency and difficulty urinating.  Musculoskeletal: Negative for gait problem and myalgias.  Skin: Negative for color change, pallor and rash.  Neurological: Negative for dizziness, speech difficulty, weakness and numbness.  Hematological: Negative for adenopathy.  Psychiatric/Behavioral: Positive for decreased concentration. Negative for confusion and agitation. The patient is not nervous/anxious.        Objective:   Physical Exam  Constitutional: She is oriented to person, place, and time. She appears well-developed and well-nourished. No distress.  HENT:  Head: Normocephalic.  Mouth/Throat: Oropharynx is clear and moist. No oropharyngeal exudate.  Eyes: Conjunctivae and EOM are normal. Pupils are equal, round, and reactive to light. No scleral icterus.  Neck: Normal range of motion. No tracheal deviation present.  Cardiovascular: Normal rate and intact distal pulses.   Pulmonary/Chest: Effort normal. No respiratory distress. She exhibits no tenderness.  Abdominal: Soft. She exhibits no distension. There is no tenderness. Hernia confirmed negative in the ventral area, confirmed negative in the right inguinal area and confirmed negative in the left inguinal area.    Incisions clean with normal healing ridges.  No hernias  Genitourinary: No vaginal discharge found.  Musculoskeletal: Normal range of motion. She exhibits edema. She exhibits no tenderness.  Lymphadenopathy:       Right: No inguinal adenopathy present.       Left: No inguinal adenopathy present.  Neurological: She is alert and oriented to person, place, and time. No cranial nerve deficit or sensory deficit. She exhibits normal muscle tone. Coordination normal. GCS eye subscore is 4. GCS verbal subscore is 5. GCS motor subscore is 6.  Skin: Skin is warm and dry. No rash noted. She is not  diaphoretic.  Psychiatric: She has a normal  mood and affect. Her behavior is normal. Judgment and thought content normal. Cognition and memory are impaired.  Somewhat slow in her recall.  Mildly forgetful       Assessment:     Struggling with postoperative diarrhea still.  Did not really follow any of my recommendations from last time.  Improved from 1st month but not much better over past month.  Certainly not having the intermittent cramping and bloating as before surgery.     Plan:     I think she needs to get her diarrhea under better control.  I recommended she increase the Imodium.  Try 4 mg at BID up to QID.  Consider increasing iron to more than just once a day.  I recommend she switch her fiber bowel regimen.  Switch to flaxseed poweder 1-2x/day.  Should help thicken the stools.  Last flatulence and then Citrucel.  If she has not had any relief with yogurt, she can stop that.  She does not like the taste.  Because she is not better with her chronic diarrhea, I strongly recommended a reevaluation with [facility].  She likes Dr Hilarie Fredrickson.  See if she should start a PPI or other more aggressive antidiarrheal regimen.  I strongly recommend she call me if things are not getting better within the next week or so.  Certainly call sooner if worse.  I went over directions and instructions many times to reinforce them again.  Gave her a handout with explicit instructions since she seemed to be needing repeated directions.  She expressed understanding and appreciation

## 2014-04-04 NOTE — Patient Instructions (Signed)
Please consider the recommendations that we have given you today:  See Dr Hilarie Fredrickson with [facility] to help with your chronic diarrhea.  Increase the Imodium to 2 pills twice a day.  Can take extra pills.  Take no more than 8 pills a day.  Goal is 1-3 formed bowel movements a day  Consider trying flaxseed flour/powder twice a day to help decrease gassiness (usually in grocery store baking section with flours  See the Handout(s) we have given you.  Please call our office at [phone] if you wish to schedule surgery or if you have further questions / concerns.    GETTING TO GOOD BOWEL HEALTH. Irregular bowel habits such as constipation and diarrhea can lead to many problems over time.  Having one soft bowel movement a day is the most important way to prevent further problems.  The anorectal canal is designed to handle stretching and feces to safely manage our ability to get rid of solid waste (feces, poop, stool) out of our body.  BUT, hard constipated stools can act like ripping concrete bricks and diarrhea can be a burning fire to this very sensitive area of our body, causing inflamed hemorrhoids, anal fissures, increasing risk is perirectal abscesses, abdominal pain/bloating, an making irritable bowel worse.     The goal: ONE SOFT BOWEL MOVEMENT A DAY!  To have soft, regular bowel movements:    Drink at least 8 tall glasses of water a day.     Take plenty of fiber.  Fiber is the undigested part of plant food that passes into the colon, acting s "natures broom" to encourage bowel motility and movement.  Fiber can absorb and hold large amounts of water. This results in a larger, bulkier stool, which is soft and easier to pass. Work gradually over several weeks up to 6 servings a day of fiber (25g a day even more if needed) in the form of: o Vegetables -- Root (potatoes, carrots, turnips), leafy green (lettuce, salad greens, celery, spinach), or cooked high residue (cabbage, broccoli,  etc) o Fruit -- Fresh (unpeeled skin & pulp), Dried (prunes, apricots, cherries, etc ),  or stewed ( applesauce)  o Whole grain breads, pasta, etc (whole wheat)  o Bran cereals    Bulking Agents -- This type of water-retaining fiber generally is easily obtained each day by one of the following:  o Psyllium bran -- The psyllium plant is remarkable because its ground seeds can retain so much water. This product is available as Metamucil, Konsyl, Effersyllium, Per Diem Fiber, or the less expensive generic preparation in drug and health food stores. Although labeled a laxative, it really is not a laxative.  o Methylcellulose -- This is another fiber derived from wood which also retains water. It is available as Citrucel. o Polyethylene Glycol - and "artificial" fiber commonly called Miralax or Glycolax.  It is helpful for people with gassy or bloated feelings with regular fiber o Flax Seed Powder (in flour/baking section)- a less gassy fiber than psyllium o    Controlling diarrhea o Switch to liquids and simpler foods for a few days to avoid stressing your intestines further. o Avoid dairy products (especially milk & ice cream) for a short time.  The intestines often can lose the ability to digest lactose when stressed. o Avoid foods that cause gassiness or bloating.  Typical foods include beans and other legumes, cabbage, broccoli, and dairy foods.  Every person has some sensitivity to other foods, so listen to our  body and avoid those foods that trigger problems for you. o Adding fiber (Flax seed, Citrucel, Metamucil, psyllium, Miralax) gradually can help thicken stools by absorbing excess fluid and retrain the intestines to act more normally.  Slowly increase the dose over a few weeks.  Too much fiber too soon can backfire and cause cramping & bloating. o Probiotics (such as active yogurt, Align, etc) may help repopulate the intestines and colon with normal bacteria and calm down a sensitive digestive  tract.  Most studies show it to be of mild help, though, and such products can be costly. o Medicines:   Bismuth subsalicylate (ex. Kayopectate, Pepto Bismol) every 30 minutes for up to 6 doses can help control diarrhea.  Avoid if pregnant.   Loperamide (Immodium) can slow down diarrhea.  Start with two tablets (4mg  total) first and then try one tablet every 6 hours.  Avoid if you are having fevers or severe pain.  If you are not better or start feeling worse, stop all medicines and call your doctor for advice o Call your doctor if you are getting worse or not better.  Sometimes further testing (cultures, endoscopy, X-ray studies, bloodwork, etc) may be needed to help diagnose and treat the cause of the diarrhea.  Diarrhea Diarrhea is frequent loose and watery bowel movements. It can cause you to feel weak and dehydrated. Dehydration can cause you to become tired and thirsty, have a dry mouth, and have decreased urination that often is dark yellow. Diarrhea is a sign of another problem, most often an infection that will not last long. In most cases, diarrhea typically lasts 2-3 days. However, it can last longer if it is a sign of something more serious. It is important to treat your diarrhea as directed by your caregiver to lessen or prevent future episodes of diarrhea. CAUSES  Some common causes include:  Gastrointestinal infections caused by viruses, bacteria, or parasites.  Food poisoning or food allergies.  Certain medicines, such as antibiotics, chemotherapy, and laxatives.  Artificial sweeteners and fructose.  Digestive disorders. HOME CARE INSTRUCTIONS  Ensure adequate fluid intake (hydration): Have 1 cup (8 oz) of fluid for each diarrhea episode. Avoid fluids that contain simple sugars or sports drinks, fruit juices, whole milk products, and sodas. Your urine should be clear or pale yellow if you are drinking enough fluids. Hydrate with an oral rehydration solution that you can purchase  at pharmacies, retail stores, and online. You can prepare an oral rehydration solution at home by mixing the following ingredients together:   - tsp table salt.   tsp baking soda.   tsp salt substitute containing potassium chloride.  1  tablespoons sugar.  1 L (34 oz) of water.  Certain foods and beverages may increase the speed at which food moves through the gastrointestinal (GI) tract. These foods and beverages should be avoided and include:  Caffeinated and alcoholic beverages.  High-fiber foods, such as raw fruits and vegetables, nuts, seeds, and whole grain breads and cereals.  Foods and beverages sweetened with sugar alcohols, such as xylitol, sorbitol, and mannitol.  Some foods may be well tolerated and may help thicken stool including:  Starchy foods, such as rice, toast, pasta, low-sugar cereal, oatmeal, grits, baked potatoes, crackers, and bagels.  Bananas.  Applesauce.  Add probiotic-rich foods to help increase healthy bacteria in the GI tract, such as yogurt and fermented milk products.  Wash your hands well after each diarrhea episode.  Only take over-the-counter or prescription medicines as directed  by your caregiver.  Take a warm bath to relieve any burning or pain from frequent diarrhea episodes. SEEK IMMEDIATE MEDICAL CARE IF:   You are unable to keep fluids down.  You have persistent vomiting.  You have blood in your stool, or your stools are black and tarry.  You do not urinate in 6-8 hours, or there is only a small amount of very dark urine.  You have abdominal pain that increases or localizes.  You have weakness, dizziness, confusion, or light-headedness.  You have a severe headache.  Your diarrhea gets worse or does not get better.  You have a fever or persistent symptoms for more than 2-3 days.  You have a fever and your symptoms suddenly get worse. MAKE SURE YOU:   Understand these instructions.  Will watch your condition.  Will  get help right away if you are not doing well or get worse. Document Released: 08/14/2002 Document Revised: 01/08/2014 Document Reviewed: 05/01/2012 [facility] Patient Information 2015 [place], [place]. This information is not intended to replace advice given to you by your health care provider. Make sure you discuss any questions you have with your health care provider.

## 2014-04-06 ENCOUNTER — Ambulatory Visit (INDEPENDENT_AMBULATORY_CARE_PROVIDER_SITE_OTHER): Payer: Medicare Other | Admitting: *Deleted

## 2014-04-06 ENCOUNTER — Telehealth: Payer: Self-pay

## 2014-04-06 DIAGNOSIS — I4891 Unspecified atrial fibrillation: Secondary | ICD-10-CM

## 2014-04-06 DIAGNOSIS — Z5181 Encounter for therapeutic drug level monitoring: Secondary | ICD-10-CM

## 2014-04-06 LAB — POCT INR: INR: 4.5

## 2014-04-06 NOTE — Telephone Encounter (Signed)
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 3 months out & struggling with postoperative diarrhea. ?IBS? Struggles with compliance with recommendations. Recommended increasing antidiarrheal bowel regimen. Recommended seeing gastroenterology for further advice. Return to see if a second opinion and a fresh evaluation can help her out. See what could you can do to help her out, Ulice Dash. Thanks.    Patient notified.  I have scheduled her a follow up with Dr. Hilarie Fredrickson for 05/07/14 4:00

## 2014-04-11 ENCOUNTER — Other Ambulatory Visit: Payer: Self-pay

## 2014-04-11 MED ORDER — LISINOPRIL 10 MG PO TABS: 10.0000 mg | ORAL_TABLET | Freq: Two times a day (BID) | ORAL | Status: DC

## 2014-04-11 MED ORDER — HYDRALAZINE HCL 25 MG PO TABS
25.0000 mg | ORAL_TABLET | Freq: Three times a day (TID) | ORAL | Status: DC
Start: 2014-04-11 — End: 2014-04-17

## 2014-04-12 ENCOUNTER — Other Ambulatory Visit: Payer: Self-pay

## 2014-04-12 DIAGNOSIS — I5033 Acute on chronic diastolic (congestive) heart failure: Secondary | ICD-10-CM

## 2014-04-12 DIAGNOSIS — R0602 Shortness of breath: Secondary | ICD-10-CM

## 2014-04-12 MED ORDER — FUROSEMIDE 40 MG PO TABS: 40.0000 mg | ORAL_TABLET | Freq: Every day | ORAL | Status: DC

## 2014-04-13 ENCOUNTER — Ambulatory Visit: Payer: Medicare Other | Admitting: Gastroenterology

## 2014-04-17 ENCOUNTER — Other Ambulatory Visit: Payer: Self-pay

## 2014-04-17 MED ORDER — HYDRALAZINE HCL 25 MG PO TABS: 25.0000 mg | ORAL_TABLET | Freq: Three times a day (TID) | ORAL | Status: DC

## 2014-04-20 ENCOUNTER — Ambulatory Visit (INDEPENDENT_AMBULATORY_CARE_PROVIDER_SITE_OTHER): Payer: Medicare Other | Admitting: *Deleted

## 2014-04-20 DIAGNOSIS — I4891 Unspecified atrial fibrillation: Secondary | ICD-10-CM

## 2014-04-20 DIAGNOSIS — Z5181 Encounter for therapeutic drug level monitoring: Secondary | ICD-10-CM

## 2014-04-20 LAB — POCT INR: INR: 2.3

## 2014-05-07 ENCOUNTER — Encounter: Payer: Self-pay | Admitting: Internal Medicine

## 2014-05-07 ENCOUNTER — Ambulatory Visit (INDEPENDENT_AMBULATORY_CARE_PROVIDER_SITE_OTHER)
Admission: RE | Admit: 2014-05-07 | Discharge: 2014-05-07 | Disposition: A | Discharge status: Home / Self Care | Payer: Medicare Other | Source: Ambulatory Visit | Attending: Internal Medicine | Admitting: Internal Medicine

## 2014-05-07 ENCOUNTER — Ambulatory Visit (INDEPENDENT_AMBULATORY_CARE_PROVIDER_SITE_OTHER): Payer: Medicare Other | Admitting: Internal Medicine

## 2014-05-07 ENCOUNTER — Ambulatory Visit (INDEPENDENT_AMBULATORY_CARE_PROVIDER_SITE_OTHER): Payer: Medicare Other

## 2014-05-07 VITALS — BP 112/60 | HR 86 | Ht 67.0 in | Wt 178.0 lb

## 2014-05-07 DIAGNOSIS — R059 Cough, unspecified: Secondary | ICD-10-CM

## 2014-05-07 DIAGNOSIS — D649 Anemia, unspecified: Secondary | ICD-10-CM

## 2014-05-07 DIAGNOSIS — K660 Peritoneal adhesions (postprocedural) (postinfection): Secondary | ICD-10-CM

## 2014-05-07 DIAGNOSIS — R05 Cough: Secondary | ICD-10-CM

## 2014-05-07 DIAGNOSIS — R197 Diarrhea, unspecified: Secondary | ICD-10-CM

## 2014-05-07 DIAGNOSIS — R509 Fever, unspecified: Secondary | ICD-10-CM

## 2014-05-07 DIAGNOSIS — R0989 Other specified symptoms and signs involving the circulatory and respiratory systems: Secondary | ICD-10-CM

## 2014-05-07 LAB — COMPREHENSIVE METABOLIC PANEL
ALBUMIN: 3 g/dL — AB (ref 3.5–5.2)
ALK PHOS: 113 U/L (ref 39–117)
ALT: 49 U/L — AB (ref 0–35)
AST: 49 U/L — AB (ref 0–37)
BUN: 14 mg/dL (ref 6–23)
CALCIUM: 8.5 mg/dL (ref 8.4–10.5)
CHLORIDE: 104 meq/L (ref 96–112)
CO2: 22 mEq/L (ref 19–32)
Creatinine, Ser: 1.3 mg/dL — ABNORMAL HIGH (ref 0.4–1.2)
GFR: 40.74 mL/min — ABNORMAL LOW (ref >60.00)
Glucose, Bld: 107 mg/dL — ABNORMAL HIGH (ref 70–99)
POTASSIUM: 3.6 meq/L (ref 3.5–5.1)
SODIUM: 139 meq/L (ref 135–145)
TOTAL PROTEIN: 7.2 g/dL (ref 6.0–8.3)
Total Bilirubin: 0.6 mg/dL (ref 0.2–1.2)

## 2014-05-07 LAB — CBC WITH DIFFERENTIAL/PLATELET
Basophils Absolute: 0 10*3/uL (ref 0.0–0.1)
Basophils Relative: 0.3 % (ref 0.0–3.0)
Eosinophils Absolute: 0 10*3/uL (ref 0.0–0.7)
Eosinophils Relative: 0.8 % (ref 0.0–5.0)
HCT: 38.4 % (ref 36.0–46.0)
Hemoglobin: 12.6 g/dL (ref 12.0–15.0)
Lymphocytes Relative: 27.6 % (ref 12.0–46.0)
Lymphs Abs: 1.1 10*3/uL (ref 0.7–4.0)
MCHC: 32.8 g/dL (ref 30.0–36.0)
MCV: 88.6 fl (ref 78.0–100.0)
Monocytes Absolute: 0.6 10*3/uL (ref 0.1–1.0)
Monocytes Relative: 14.9 % — ABNORMAL HIGH (ref 3.0–12.0)
Neutro Abs: 2.2 10*3/uL (ref 1.4–7.7)
Neutrophils Relative %: 56.4 % (ref 43.0–77.0)
Platelets: 211 10*3/uL (ref 150.0–400.0)
RBC: 4.34 Mil/uL (ref 3.87–5.11)
RDW: 15.5 % (ref 11.5–15.5)
WBC: 3.9 10*3/uL — ABNORMAL LOW (ref 4.0–10.5)

## 2014-05-07 LAB — IRON: IRON: 37 ug/dL — AB (ref 42–145)

## 2014-05-07 LAB — FERRITIN: FERRITIN: 140.2 ng/mL (ref 10.0–291.0)

## 2014-05-07 MED ORDER — RIFAXIMIN 550 MG PO TABS: 550.0000 mg | ORAL_TABLET | Freq: Three times a day (TID) | ORAL | Status: DC

## 2014-05-07 NOTE — Patient Instructions (Addendum)
Go to the basement for your chest xray Go to the basement for labs Use Imodium 2 by mouth three times a day  Your prescription has been sent to your pharmacy  Turn in your stool studies before starting [facility]

## 2014-05-07 NOTE — Progress Notes (Signed)
Subjective:    Patient ID: Yolanda Spears, female    DOB: 1935/02/02, 79 y.o.   MRN: [id number]  HPI Yolanda Spears is a 79 year old female with a past medical history of transient small bowel obstructions felt secondary to adhesive abdominal disease status post recent open LOA and small bowel/ileal resection with Dr. Johney [place] in April 2015 who is seen for followup. She has a history of breast cancer in situ status post double mastectomy, endometrial cancer status post hysterectomy with radiation 2006, atrial fibrillation on warfarin, colon polyps with last colonoscopy in 2011, TIAs, hypertension. She is here alone today.  She reports she has had significant trouble with diarrhea which all started after her abdominal surgery. She reports she is taking loperamide 2 mg in the morning, at lunchtime, and 4 mg at bedtime. Despite this she is having 7-8 bowel movements a day which are always watery. She denies abdominal pain. She does have some gas and bloating. Diarrhea ranges in color from light to dark and even black. She is taking oral iron daily. She is also using a fiber supplement. She has not seen frank blood in her stool. She denies nausea or vomiting. She has lost about 27 pounds since surgery. She does feel tired at times exhausted.  Last Tuesday she developed fever highest 102.8, associated with cough and congestion. She took Advil and ibuprofen. Last fever was Thursday (4 days ago). She denies chest pain or dyspnea today. She occasionally has dysuria but no hematuria or frequency.  She's had no further episodes of painful abdominal pain associated with nausea or vomiting (felt to be small bowel obstruction) since her surgery. She reports she is glad she had the surgery but wishes the diarrhea would improve.  She reports that Dr. Noah Delaine, her PCP, did labs earlier this month and told her "your labs look good"  Review of Systems As per history of present illness, otherwise negative  Current  Medications, Allergies, Past Medical History, Past Surgical History, Family History and Social History were reviewed in [place] record.     Objective:   Physical Exam BP 112/60  Pulse 86  Ht 5\' 7"  (1.702 m)  Wt 178 lb (80.74 kg)  BMI 27.87 kg/m2 Constitutional: Well-developed and well-nourished. No distress. HEENT: Normocephalic and atraumatic. Oropharynx is clear and moist. No oropharyngeal exudate. Conjunctivae are normal.  No scleral icterus. Cardiovascular: Normal rate, regular rhythm and intact distal pulses. No M/R/G Pulmonary/chest: Effort normal and breath sounds normal. No wheezing, rales or rhonchi. Abdominal: Soft, nontender, nondistended. Bowel sounds active throughout. Abdominal incision is well healed  Rectal: Perianal irritation without ulceration, no fistula or fluctuance, no rectal mass, scant light brown stool in the vault which was heme positive Extremities: no clubbing, cyanosis, with trace pretibial edema Neurological: Alert and oriented to person place and time. Skin: Skin is warm and dry. No rashes noted. Psychiatric: Normal mood and affect. Behavior is normal.  CBC    Component Value Date/Time   WBC 6.5 02/12/2014 1643   RBC 3.78* 02/12/2014 1643   HGB 10.7* 02/12/2014 1643   HCT 33.8* 02/12/2014 1643   PLT 213.0 02/12/2014 1643   MCV 89.4 02/12/2014 1643   MCH 28.3 01/18/2014 1534   MCHC 31.8 02/12/2014 1643   RDW 17.6* 02/12/2014 1643   LYMPHSABS 1.6 02/12/2014 1643   MONOABS 0.6 02/12/2014 1643   EOSABS 0.2 02/12/2014 1643   BASOSABS 0.1 02/12/2014 1643   CMP     Component Value Date/Time  NA 138 03/02/2014 1233   K 3.9 03/02/2014 1233   CL 102 03/02/2014 1233   CO2 27 03/02/2014 1233   GLUCOSE 130* 03/02/2014 1233   BUN 15 03/02/2014 1233   CREATININE 1.3* 03/02/2014 1233   CREATININE 1.04 01/18/2014 1534   CALCIUM 9.0 03/02/2014 1233   PROT 6.1 01/18/2014 1534   ALBUMIN 3.0* 01/18/2014 1534   AST 17 01/18/2014 1534   ALT 18 01/18/2014 1534    ALKPHOS 84 01/18/2014 1534   BILITOT 0.7 01/18/2014 1534   GFRNONAA 61* 01/02/2014 0345   GFRAA 70* 01/02/2014 0345    CT scan abd pelvis - reviewed, performed Jan 2015    Assessment & Plan:  79 year old female with a past medical history of transient small bowel obstructions felt secondary to adhesive abdominal disease status post recent open LOA and small bowel/ileal resection with Dr. Johney Maine in April 2015 who is seen for followup.  1. Diarrhea (post-operative after LOA with ileal resection) -- diarrhea has been present and fairly severe since surgery. Approximately 18 inches of small bowel was resected by Dr. Johney Maine in April 2015.  The pathology was reviewed which revealed benign small bowel with serosal adhesions. There was no dysplasia or malignancy. Margins were viable. There was no evidence of dysplasia, malignancy or Crohn's disease.  She continues to have diarrhea and has had associated weight loss. Etiology uncertain but includes infectious etiology. Check GI pathogen panel today, fecal leukocytes, fecal last days. CBC, CMP, celiac panel and iron studies. I will increase Imodium to 4 mg 3 times daily. After she submits stool studies I am going to empirically treat her with rifaximin 550 mg 3 times daily for 10 days. She will continue fiber supplementation. If diarrhea persists and is not significantly improved, or other etiology not found, next step would be colonoscopy. She is due colonoscopy in 2016 anyway for history of adenomatous polyp. She will continue supplemental iron.  2.  Fever -- unclear etiology and none in 4 days. Chest x-ray today given cough, and urinalysis with culture. Call me and primary care if fever returns.  3. History of adenomatous colon polyp -- do surveillance and 2016, though may need colonoscopy sooner, see above.  4.  Anemia -- persistent after surgery with normal MCV. CBC today with iron studies. She is on iron supplementation already.  Office followup in 3-4  weeks with me or advanced practitioner.

## 2014-05-08 ENCOUNTER — Other Ambulatory Visit: Payer: Self-pay | Admitting: Cardiology

## 2014-05-08 LAB — RETICULIN ANTIBODIES, IGA W TITER: Reticulin Ab, IgA: NEGATIVE

## 2014-05-09 ENCOUNTER — Encounter: Payer: Self-pay | Admitting: *Deleted

## 2014-05-09 ENCOUNTER — Other Ambulatory Visit: Payer: Medicare Other

## 2014-05-09 ENCOUNTER — Telehealth: Payer: Self-pay | Admitting: Internal Medicine

## 2014-05-09 DIAGNOSIS — R197 Diarrhea, unspecified: Secondary | ICD-10-CM

## 2014-05-09 LAB — URINALYSIS, ROUTINE W REFLEX MICROSCOPIC
BILIRUBIN URINE: NEGATIVE
KETONES UR: NEGATIVE
NITRITE: POSITIVE — AB
PH: 6 (ref 5.0–8.0)
Specific Gravity, Urine: 1.01 (ref 1.000–1.030)
Total Protein, Urine: NEGATIVE
Urine Glucose: NEGATIVE
Urobilinogen, UA: 0.2 (ref 0.0–1.0)

## 2014-05-09 NOTE — Telephone Encounter (Signed)
I have already sent patient's information to [facility] to get prior authorization. Patient has been advised and verbalizes understanding.

## 2014-05-10 ENCOUNTER — Telehealth: Payer: Self-pay | Admitting: *Deleted

## 2014-05-10 ENCOUNTER — Telehealth: Payer: Self-pay | Admitting: Internal Medicine

## 2014-05-10 ENCOUNTER — Ambulatory Visit: Payer: Medicare Other

## 2014-05-10 DIAGNOSIS — N390 Urinary tract infection, site not specified: Secondary | ICD-10-CM

## 2014-05-10 LAB — GASTROINTESTINAL PATHOGEN PANEL PCR
C. difficile Tox A/B, PCR: POSITIVE — CR
Campylobacter, PCR: NEGATIVE
Cryptosporidium, PCR: NEGATIVE
E COLI (STEC) STX1/STX2, PCR: NEGATIVE
E coli (ETEC) LT/ST PCR: NEGATIVE
E coli 0157, PCR: NEGATIVE
GIARDIA LAMBLIA, PCR: NEGATIVE
Norovirus, PCR: NEGATIVE
ROTAVIRUS, PCR: NEGATIVE
SALMONELLA, PCR: NEGATIVE
SHIGELLA, PCR: NEGATIVE

## 2014-05-10 LAB — FECAL LACTOFERRIN, QUANT: LACTOFERRIN: NEGATIVE

## 2014-05-10 NOTE — Telephone Encounter (Signed)
[facility] has approved medication and will be sending confirmation shortly.

## 2014-05-10 NOTE — Telephone Encounter (Signed)
Doreene Nest has been approved from 02/09/14-08/09/14.

## 2014-05-10 NOTE — Telephone Encounter (Signed)
Patient's xifaxan was approved through insurance. However, even after approval, she will still be charged $500 for the medication. Dr Hilarie Fredrickson, please advise.Marland KitchenMarland Kitchen

## 2014-05-11 ENCOUNTER — Telehealth: Payer: Self-pay

## 2014-05-11 MED ORDER — METRONIDAZOLE 500 MG PO TABS
500.0000 mg | ORAL_TABLET | Freq: Three times a day (TID) | ORAL | Status: DC
Start: 2014-05-11 — End: 2014-05-31

## 2014-05-11 NOTE — Telephone Encounter (Signed)
Spoke with pt and she is aware, script sent to the pharmacy. 

## 2014-05-11 NOTE — Telephone Encounter (Signed)
Hold off on using Xifaxan  Metronidazole 500 mg 3 times a day for 14 days please  She should notice improvement with 5 days, if she is not getting better at that point, and I don't been resolved but it has been getting better, she should call back. Resolution may take 7-14 days.  If worsening call back sooner and would switch to vancomycin  Need INR checked at Coumadin clinic in one week and starting antibiotics

## 2014-05-11 NOTE — Telephone Encounter (Signed)
Yolanda Spears pt. Received call from [facility] lab that pt is positive for Cdiff. Dr. Carlean Purl as doc of the day please advise.

## 2014-05-12 LAB — URINE CULTURE: Colony Count: >=100000

## 2014-05-14 NOTE — Telephone Encounter (Signed)
Holding on rifaximin with recent c diff DX, see result notes JMP

## 2014-05-15 ENCOUNTER — Other Ambulatory Visit: Payer: Self-pay

## 2014-05-15 ENCOUNTER — Telehealth: Payer: Self-pay | Admitting: Pharmacist Clinician (PhC)/ Clinical Pharmacy Specialist

## 2014-05-15 LAB — PANCREATIC ELASTASE, FECAL: Pancreatic Elastase-1, Stool: 491 mcg/g

## 2014-05-15 MED ORDER — SULFAMETHOXAZOLE-TMP DS 800-160 MG PO TABS: 1.0000 | ORAL_TABLET | Freq: Two times a day (BID) | ORAL | Status: DC

## 2014-05-15 NOTE — Telephone Encounter (Signed)
Pt started on flagyl tid x 14d on 9-4, now to start bactrim ds bid x 5d on 9-9.  Advised pt to hold warfarin dose today, resume tomorrow, has INR scheduled for Thursday.  Pt voiced understanding

## 2014-05-17 ENCOUNTER — Ambulatory Visit (INDEPENDENT_AMBULATORY_CARE_PROVIDER_SITE_OTHER): Payer: Medicare Other | Admitting: *Deleted

## 2014-05-17 DIAGNOSIS — Z5181 Encounter for therapeutic drug level monitoring: Secondary | ICD-10-CM

## 2014-05-17 DIAGNOSIS — I4891 Unspecified atrial fibrillation: Secondary | ICD-10-CM

## 2014-05-17 LAB — PROTIME-INR
INR: 7.58 — AB (ref <1.50)
PROTHROMBIN TIME: 64.3 s — AB (ref 11.6–15.2)

## 2014-05-17 LAB — POCT INR: INR: 8

## 2014-05-22 ENCOUNTER — Ambulatory Visit (INDEPENDENT_AMBULATORY_CARE_PROVIDER_SITE_OTHER): Payer: Medicare Other | Admitting: *Deleted

## 2014-05-22 DIAGNOSIS — Z5181 Encounter for therapeutic drug level monitoring: Secondary | ICD-10-CM

## 2014-05-22 DIAGNOSIS — I4891 Unspecified atrial fibrillation: Secondary | ICD-10-CM

## 2014-05-22 LAB — POCT INR: INR: 3.7

## 2014-05-24 LAB — GLIADIN ANTIBODIES, SERUM
GLIADIN IGA: 10 U/mL (ref <20)
Gliadin IgG: 35.1 U/mL — ABNORMAL HIGH (ref <20)

## 2014-05-24 LAB — TISSUE TRANSGLUTAMINASE, IGA: Tissue Transglutaminase Ab, IgA: 19.8 U/mL (ref <20)

## 2014-05-25 ENCOUNTER — Telehealth: Payer: Self-pay | Admitting: Internal Medicine

## 2014-05-25 NOTE — Telephone Encounter (Signed)
Pt states she will finish the flagyl this weekend that she was given for cdiff. Pt states she was told to be seen for an OV once med complete. Pt scheduled to see Tye Savoy NP 05/31/14@2 :30pm. Pt aware of appt.

## 2014-05-28 ENCOUNTER — Ambulatory Visit (INDEPENDENT_AMBULATORY_CARE_PROVIDER_SITE_OTHER): Payer: Medicare Other | Admitting: *Deleted

## 2014-05-28 DIAGNOSIS — Z5181 Encounter for therapeutic drug level monitoring: Secondary | ICD-10-CM

## 2014-05-28 DIAGNOSIS — I4891 Unspecified atrial fibrillation: Secondary | ICD-10-CM

## 2014-05-28 LAB — POCT INR: INR: 2.2

## 2014-05-31 ENCOUNTER — Encounter: Payer: Self-pay | Admitting: Nurse Practitioner

## 2014-05-31 ENCOUNTER — Ambulatory Visit (INDEPENDENT_AMBULATORY_CARE_PROVIDER_SITE_OTHER): Payer: Medicare Other | Admitting: Nurse Practitioner

## 2014-05-31 VITALS — BP 144/64 | HR 76 | Ht 66.0 in | Wt 182.0 lb

## 2014-05-31 DIAGNOSIS — A498 Other bacterial infections of unspecified site: Secondary | ICD-10-CM

## 2014-05-31 DIAGNOSIS — A499 Bacterial infection, unspecified: Secondary | ICD-10-CM

## 2014-05-31 DIAGNOSIS — B9689 Other specified bacterial agents as the cause of diseases classified elsewhere: Secondary | ICD-10-CM

## 2014-05-31 MED ORDER — METRONIDAZOLE 500 MG PO TABS: 500.0000 mg | ORAL_TABLET | Freq: Three times a day (TID) | ORAL | Status: DC

## 2014-05-31 NOTE — Patient Instructions (Signed)
Flagyl as written by Dr. Hilarie Fredrickson was sent to your pharmacy, you can pick it up and hold it until you need it.  Coupon for [employer] given today with information. Florastor can be purchased over the counter, please take one capsule twice daily for ten days.

## 2014-06-01 ENCOUNTER — Encounter: Payer: Self-pay | Admitting: Nurse Practitioner

## 2014-06-01 DIAGNOSIS — A498 Other bacterial infections of unspecified site: Secondary | ICD-10-CM | POA: Insufficient documentation

## 2014-06-01 NOTE — Progress Notes (Addendum)
     History of Present Illness:  Patient is a 79 year old female known to Dr. Hilarie Fredrickson. She is status post lap/open lysis of adhesions with small bowel resection for chronic low-grade small bowel obstruction (Dr. Johney [place], April 2015). Patient was having problems with postoperative diarrhea for which she saw [place] earlier this month. C. Difficile checked and was positive. Patient was started on Flagyl and overall diarrhea significantly improved the patient had continued to take 6 Imodium daily. Stools were less frequent and had begun to solidify until yesterday when she developed recurrent diarrhea. Diarrhea continued through the night but so far no bowel movements this morning. No associated abdominal pain or fever.  Current Medications, Allergies, Past Medical History, Past Surgical History, Family History and Social History were reviewed in [employer] record.  Physical Exam: General: Pleasant, well developed , white female in no acute distress Head: Normocephalic and atraumatic Eyes:  sclerae anicteric, conjunctiva pink  Ears: Normal auditory acuity Lungs: Clear throughout to auscultation Heart: Regular rate and rhythm Abdomen: Soft, non distended, non-tender. No masses, no hepatomegaly. Normal bowel sounds Musculoskeletal: Symmetrical with no gross deformities  Extremities: No edema  Neurological: Alert oriented x 4, grossly nonfocal Psychological:  Alert and cooperative. Normal mood and affect  Assessment and Recommendations:  79 year old female with recent C. Difficile infection. Diarrhea had improved on Flagyl but now having recurrent diarrhea over the last 24 hours. Of note patient was taking Bactrim for UTI while on Flagyl for C-diff. She had diarrhea yesterday and last night but none this am. I am not yet convinced she needs another course of Flagyl and florastor. Since tomorrow is Friday I will call a Rx of flagyl and florastor to her pharmacy in case she has  ongoing significant diarrhea. It may be a while before she knows what her post-operative bowel habits will be like. Patient will call us on Monday if she required additional Flagyl. [place] realizes antibiotics can interfere with coumadin. She is already scheduled to have an INR on 06/06/14  Addendum: Reviewed and agree with initial management. Patient also had celiac serologies suggestive of possible celiac disease (TTG was borderline, gliadin was positive), and given her ongoing issues, I think EGD is a good idea for small bowel bx. Her warfarin will need to be held with permission from her prescribing MD.  Prescribing MD should make decision about need for lovenox bridging. Also repeat c diff PCR to confirm eradication of this infection Jerene Bears, MD

## 2014-06-06 ENCOUNTER — Ambulatory Visit (INDEPENDENT_AMBULATORY_CARE_PROVIDER_SITE_OTHER): Payer: Medicare Other | Admitting: Pharmacist

## 2014-06-06 ENCOUNTER — Telehealth: Payer: Self-pay | Admitting: Internal Medicine

## 2014-06-06 ENCOUNTER — Other Ambulatory Visit (HOSPITAL_COMMUNITY): Payer: Self-pay | Admitting: *Deleted

## 2014-06-06 DIAGNOSIS — Z5181 Encounter for therapeutic drug level monitoring: Secondary | ICD-10-CM

## 2014-06-06 DIAGNOSIS — I4891 Unspecified atrial fibrillation: Secondary | ICD-10-CM

## 2014-06-06 DIAGNOSIS — I6529 Occlusion and stenosis of unspecified carotid artery: Secondary | ICD-10-CM

## 2014-06-06 LAB — POCT INR: INR: 2.3

## 2014-06-06 NOTE — Progress Notes (Signed)
Left message for pt to call back  °

## 2014-06-07 NOTE — Telephone Encounter (Signed)
Spoke with pt, see addendum to OV note.

## 2014-06-07 NOTE — Progress Notes (Signed)
Spoke with pt and she is aware of Dr. Vena Rua recommendations. Pt has transportation issues and would rather not come for repeat cdiff test at this point. States her diarrhea is back to where it was a couple of weeks ago. Pt would like to take the Flagyl that was prescribed and go from there. Per Dr. Hilarie Fredrickson pt may start Flagyl but if there is no improvement she will need to be tested again. Pt instructed to call back in 5 days if no better and to call back when Flagyl is completed. Discussed with pt that we would look at scheduling EGD once cdiff was eradicated. Pt verbalized understanding.

## 2014-06-11 ENCOUNTER — Telehealth: Payer: Self-pay | Admitting: Internal Medicine

## 2014-06-11 DIAGNOSIS — A0472 Enterocolitis due to Clostridium difficile, not specified as recurrent: Secondary | ICD-10-CM

## 2014-06-11 NOTE — Telephone Encounter (Signed)
Please encourage her to repeat C. difficile PCR stool test Can switch to oral vancomycin 250 4 times a day but stool test would help in decision-making

## 2014-06-11 NOTE — Telephone Encounter (Signed)
Pt started taking Flagyl again for Cdiff but states that her diarrhea is 10 times worse and that she feels nauseated, so much so that she doesn't want anything to eat and she doesn't want to take her other medications. Dr. Hilarie Fredrickson do you want to switch the pt to Vancomycin? Please advise.

## 2014-06-12 MED ORDER — VANCOMYCIN 50 MG/ML ORAL SOLUTION: 250.0000 mg | Freq: Four times a day (QID) | ORAL | Status: DC

## 2014-06-12 NOTE — Telephone Encounter (Signed)
Spoke with pt and she is aware, she is going to work on getting a ride to come to the lab and pick up vancomycin.

## 2014-06-18 ENCOUNTER — Other Ambulatory Visit: Payer: Medicare Other

## 2014-06-18 DIAGNOSIS — A0472 Enterocolitis due to Clostridium difficile, not specified as recurrent: Secondary | ICD-10-CM

## 2014-06-19 LAB — CLOSTRIDIUM DIFFICILE BY PCR: CDIFFPCR: DETECTED — AB

## 2014-06-21 ENCOUNTER — Ambulatory Visit (INDEPENDENT_AMBULATORY_CARE_PROVIDER_SITE_OTHER): Payer: Medicare Other | Admitting: Pharmacist

## 2014-06-21 ENCOUNTER — Ambulatory Visit (HOSPITAL_COMMUNITY): Payer: Medicare Other | Attending: Cardiology | Admitting: Cardiology

## 2014-06-21 DIAGNOSIS — I6523 Occlusion and stenosis of bilateral carotid arteries: Secondary | ICD-10-CM

## 2014-06-21 DIAGNOSIS — I4891 Unspecified atrial fibrillation: Secondary | ICD-10-CM | POA: Insufficient documentation

## 2014-06-21 DIAGNOSIS — E785 Hyperlipidemia, unspecified: Secondary | ICD-10-CM | POA: Insufficient documentation

## 2014-06-21 DIAGNOSIS — I10 Essential (primary) hypertension: Secondary | ICD-10-CM | POA: Insufficient documentation

## 2014-06-21 DIAGNOSIS — Z87891 Personal history of nicotine dependence: Secondary | ICD-10-CM | POA: Insufficient documentation

## 2014-06-21 DIAGNOSIS — Z8673 Personal history of transient ischemic attack (TIA), and cerebral infarction without residual deficits: Secondary | ICD-10-CM | POA: Diagnosis not present

## 2014-06-21 DIAGNOSIS — Z5181 Encounter for therapeutic drug level monitoring: Secondary | ICD-10-CM

## 2014-06-21 DIAGNOSIS — I6529 Occlusion and stenosis of unspecified carotid artery: Secondary | ICD-10-CM

## 2014-06-21 LAB — POCT INR: INR: 2.2

## 2014-06-21 NOTE — Progress Notes (Signed)
Carotid duplex performed 

## 2014-06-25 ENCOUNTER — Ambulatory Visit (INDEPENDENT_AMBULATORY_CARE_PROVIDER_SITE_OTHER): Payer: Medicare Other | Admitting: Podiatry

## 2014-06-25 ENCOUNTER — Telehealth: Payer: Self-pay | Admitting: Cardiology

## 2014-06-25 ENCOUNTER — Encounter: Payer: Self-pay | Admitting: Internal Medicine

## 2014-06-25 ENCOUNTER — Ambulatory Visit (INDEPENDENT_AMBULATORY_CARE_PROVIDER_SITE_OTHER): Payer: Medicare Other | Admitting: *Deleted

## 2014-06-25 DIAGNOSIS — I495 Sick sinus syndrome: Secondary | ICD-10-CM

## 2014-06-25 DIAGNOSIS — B351 Tinea unguium: Secondary | ICD-10-CM

## 2014-06-25 DIAGNOSIS — M79676 Pain in unspecified toe(s): Secondary | ICD-10-CM

## 2014-06-25 LAB — MDC_IDC_ENUM_SESS_TYPE_REMOTE
Battery Impedance: 180 Ohm
Battery Voltage: 2.78 V
Brady Statistic AP VP Percent: 74 %
Brady Statistic AP VS Percent: 0 %
Brady Statistic AS VP Percent: 25 %
Brady Statistic AS VS Percent: 1 %
Date Time Interrogation Session: 20151019152812
Lead Channel Impedance Value: 562 Ohm
Lead Channel Pacing Threshold Amplitude: 0.625 V
Lead Channel Pacing Threshold Amplitude: 0.625 V
Lead Channel Pacing Threshold Pulse Width: 0.4 ms
Lead Channel Sensing Intrinsic Amplitude: 1 mV
Lead Channel Setting Pacing Amplitude: 2 V
Lead Channel Setting Sensing Sensitivity: 2.8 mV
MDC IDC MSMT BATTERY REMAINING LONGEVITY: 114 mo
MDC IDC MSMT LEADCHNL RA IMPEDANCE VALUE: 521 Ohm
MDC IDC MSMT LEADCHNL RV PACING THRESHOLD PULSEWIDTH: 0.4 ms
MDC IDC SET LEADCHNL RV PACING AMPLITUDE: 2.5 V
MDC IDC SET LEADCHNL RV PACING PULSEWIDTH: 0.4 ms

## 2014-06-25 NOTE — Telephone Encounter (Signed)
Spoke with pt and reminded pt of remote transmission that is due today. Pt verbalized understanding.   

## 2014-06-25 NOTE — Progress Notes (Signed)
Remote pacemaker transmission.   

## 2014-06-26 NOTE — Progress Notes (Signed)
Patient ID: Yolanda Spears, female   DOB: [birth year], 79 y.o.   MRN: [id number]  Subjective: This patient presents again complaining of painful toenails  Objective: Discolored, elongated, brittle toenails 6-10  Assessment: Symptomatic onychomycoses 6-10  Plan: Debridement of toenails x10 without any bleeding  Reappoint x3 months

## 2014-06-28 ENCOUNTER — Telehealth: Payer: Self-pay | Admitting: Internal Medicine

## 2014-06-28 NOTE — Telephone Encounter (Signed)
Yes, plan to retest stool if diarrhea persists after completion of vancomycin treatment Continue florastor 250 mg bid Celiac ? Also remains and this can contribute to diarrhea as well, so await confirmation of c diff eradication before extending abx

## 2014-06-28 NOTE — Telephone Encounter (Signed)
Pt states she is to finish taking her vancomycin on 07/03/14. Pt states she is still having some diarrhea. Pt wants to know if she can be retested when completing the vanc and if she needs a refill. Please advise.

## 2014-06-29 ENCOUNTER — Other Ambulatory Visit: Payer: Self-pay

## 2014-06-29 DIAGNOSIS — R197 Diarrhea, unspecified: Secondary | ICD-10-CM

## 2014-06-29 NOTE — Telephone Encounter (Signed)
Spoke with pt and she is aware, order in epic. 

## 2014-07-02 ENCOUNTER — Other Ambulatory Visit: Payer: Self-pay | Admitting: Neurology

## 2014-07-02 ENCOUNTER — Encounter: Payer: Self-pay | Admitting: Cardiology

## 2014-07-02 ENCOUNTER — Ambulatory Visit (INDEPENDENT_AMBULATORY_CARE_PROVIDER_SITE_OTHER): Payer: Medicare Other

## 2014-07-02 ENCOUNTER — Ambulatory Visit (INDEPENDENT_AMBULATORY_CARE_PROVIDER_SITE_OTHER): Payer: Medicare Other | Admitting: Cardiology

## 2014-07-02 VITALS — BP 116/44 | HR 68 | Ht 67.0 in | Wt 175.0 lb

## 2014-07-02 DIAGNOSIS — I4891 Unspecified atrial fibrillation: Secondary | ICD-10-CM

## 2014-07-02 DIAGNOSIS — Z5181 Encounter for therapeutic drug level monitoring: Secondary | ICD-10-CM

## 2014-07-02 DIAGNOSIS — I495 Sick sinus syndrome: Secondary | ICD-10-CM

## 2014-07-02 DIAGNOSIS — I480 Paroxysmal atrial fibrillation: Secondary | ICD-10-CM

## 2014-07-02 DIAGNOSIS — B9689 Other specified bacterial agents as the cause of diseases classified elsewhere: Secondary | ICD-10-CM

## 2014-07-02 DIAGNOSIS — I119 Hypertensive heart disease without heart failure: Secondary | ICD-10-CM

## 2014-07-02 DIAGNOSIS — I6529 Occlusion and stenosis of unspecified carotid artery: Secondary | ICD-10-CM

## 2014-07-02 DIAGNOSIS — R0602 Shortness of breath: Secondary | ICD-10-CM

## 2014-07-02 DIAGNOSIS — A498 Other bacterial infections of unspecified site: Secondary | ICD-10-CM

## 2014-07-02 DIAGNOSIS — I5033 Acute on chronic diastolic (congestive) heart failure: Secondary | ICD-10-CM

## 2014-07-02 LAB — POCT INR: INR: 2.1

## 2014-07-02 NOTE — Progress Notes (Signed)
Yolanda Spears Date of Birth:  [birth year] [place] [address] [place], [place]  [address] [phone]        Fax   [phone]   History of Present Illness: This pleasant 79 year old woman is seen for a scheduled followup office visit. The patient has a past history of tachybradycardia syndrome and had a pacemaker implanted in 2007. She had a pacemaker generator change by Dr. Caryl Comes on 03/23/12. She is on chronic Coumadin anticoagulation.  She has a past history of paroxysmal atrial fibrillation and is on generic Rythmol..  The patient has not been having any lightheadedness or syncope. She has not been aware of any racing of her heart. No palpitations. He does have chronic positional vertigo when she changes positions or rolls over in bed. She has a history of peripheral neuropathy and sees Dr. Krista Blue who has her on Lyrica. She sleeps poorly. She has been told that she has sleep apnea. She tried a CPAP machine years ago but was unable to tolerate it.  The patient underwent abdominal surgery in April 2015 for recurrent small bowel obstruction.  Following surgery she went to a nursing facility.  While there she went into congestive heart failure with severe dyspnea hypoxemia and peripheral edema.  Lasix was adjusted and she improved over the next several weeks.  She feels now that she is back to baseline in terms of her breathing.  She is having chronic diarrhea.  She is being treated with medication for C. difficile with vancomycin.  Dr. Hilarie Fredrickson is her gastroenterologist.  Current Outpatient Prescriptions  Medication Sig Dispense Refill  . calcium carbonate (TUMS - DOSED IN MG ELEMENTAL CALCIUM) 500 MG chewable tablet Chew 1 tablet by mouth daily.      . Cholecalciferol (VITAMIN D-3) 5000 UNITS TABS Take 1 tablet by mouth 4 (four) times a week. Takes on Saturday, Sunday, Tuesday and Thursday      . Ferrous Sulfate (IRON) 325 (65 FE) MG TABS Take 1 tablet by mouth at bedtime.       . furosemide (LASIX) 40 MG tablet Take 1 tablet (40 mg total) by mouth daily.  90 tablet  0  . hydrALAZINE (APRESOLINE) 25 MG tablet Take 1 tablet (25 mg total) by mouth 3 (three) times daily.  270 tablet  1  . lisinopril (PRINIVIL,ZESTRIL) 10 MG tablet Take 1 tablet (10 mg total) by mouth 2 (two) times daily.  180 tablet  1  . loperamide (IMODIUM A-D) 2 MG tablet Take 4 mg by mouth 3 (three) times daily as needed.      . metoprolol succinate (TOPROL-XL) 100 MG 24 hr tablet Take 100 mg by mouth every morning. Take with or immediately following a meal.      . potassium chloride SA (K-DUR,KLOR-CON) 10 MEQ tablet Take 1 tablet (10 mEq total) by mouth daily.  30 tablet  11  . pregabalin (LYRICA) 75 MG capsule Take 75 mg by mouth 2 (two) times daily.      . propafenone (RYTHMOL) 225 MG tablet Take 225 mg by mouth every 8 (eight) hours.      . vancomycin (VANCOCIN) 50 mg/mL oral solution Take 5 mLs (250 mg total) by mouth 4 (four) times daily.  280 mL  0  . warfarin (COUMADIN) 2.5 MG tablet Take 1 tablet by mouth daily or as directed  30 tablet  5  . ZETIA 10 MG tablet TAKE 1/2 TABLET BY MOUTH EVERY DAY  30  tablet  1  . metroNIDAZOLE (FLAGYL) 500 MG tablet Take 1 tablet (500 mg total) by mouth 3 (three) times daily.  42 tablet  0   No current facility-administered medications for this visit.    Allergies  Allergen Reactions  . Other Hives    Contrast dye   . Penicillins Itching  . Shellfish-Derived Products Nausea And Vomiting  . Statins     Myalgias,elevated LFT studies  . Iohexol Hives and Itching    Itching in eyes, one hive on face.    Patient Active Problem List   Diagnosis Date Noted  . Atrial fibrillation 11/24/2010    Priority: Medium  . Clostridium difficile infection 06/01/2014  . Chronic diarrhea 04/04/2014  . Diarrhea 03/30/2014  . Paroxysmal atrial fibrillation 03/30/2014  . Internal hemorrhoids with irritation 01/04/2014  . Thickened small bowel - radiation  enteritis/scarring s/p ileal resection 12/28/2013 10/20/2013  . Encounter for therapeutic drug monitoring 10/05/2013  . Sleep apnea 03/29/2013  . History of adenomatous polyp of colon 04/07/2012  . Orthostatic lightheadedness 11/27/2011  . Partial small bowel obstruction - recurrent - s/p lysis of adhesions 12/28/2013 09/14/2011  . Vertigo 08/10/2011  . Hypercholesterolemia 08/10/2011  . TIA (transient ischemic attack) 04/28/2011  . Benign hypertensive heart disease without heart failure 01/09/2011  . First degree AV block 11/26/2010  . Pacemaker- Dual-chamber-mdt 11/26/2010  . Tachycardia-bradycardia syndrome 11/26/2010    History  Smoking status  . Former Smoker -- 1.00 packs/day for 40 years  . Types: Cigarettes  . Quit date: 11/26/1998  Smokeless tobacco  . Never Used    History  Alcohol Use No    Comment: 09/14/11 "last drink ~ 08/31/2009"    Family History  Problem Relation Age of Onset  . Cancer Mother 57    UTERINE  . Breast cancer Mother 61  . Hypertension Mother   . Uterine cancer Mother   . Cancer Father     PROSTATE  . Hypertension Father     Review of Systems: Constitutional: no fever chills diaphoresis or fatigue or change in weight.  Head and neck: no hearing loss, no epistaxis, no photophobia or visual disturbance. Respiratory: No cough, shortness of breath or wheezing. Cardiovascular: No chest pain peripheral edema, palpitations. Gastrointestinal: No abdominal distention, no abdominal pain, no change in bowel habits hematochezia or melena. Genitourinary: No dysuria, no frequency, no urgency, no nocturia. Musculoskeletal:No arthralgias, no back pain, no gait disturbance or myalgias. Neurological: No dizziness, no headaches, no numbness, no seizures, no syncope, no weakness, no tremors. Hematologic: No lymphadenopathy, no easy bruising. Psychiatric: No confusion, no hallucinations, no sleep disturbance.    Physical Exam: Filed Vitals:   07/02/14  1446  BP: 116/44  Pulse: 68   The general appearance reveals a well-developed well-nourished woman in no distress.The head and neck exam reveals pupils equal and reactive.  Extraocular movements are full.  There is no scleral icterus.  The mouth and pharynx are normal.  The neck is supple.  The carotids reveal no bruits.  The jugular venous pressure is normal.  The  thyroid is not enlarged.  There is no lymphadenopathy.  The chest is clear to percussion and auscultation.  There are no rales or rhonchi.  Expansion of the chest is symmetrical.  The precordium is quiet.  The first heart sound is normal.  The second heart sound is physiologically split.  There is a grade 2/6 systolic ejection murmur at the base.  There is no abnormal lift or heave.  The abdomen is soft and nontender.  The bowel sounds are normal.  The liver and spleen are not enlarged.  There are no abdominal masses.  There are no abdominal bruits.  Extremities reveal good pedal pulses.  There is no phlebitis or edema.  There is no cyanosis or clubbing.  Strength is normal and symmetrical in all extremities.  There is no lateralizing weakness.  There are no sensory deficits.  The skin is warm and dry.  There is no rash.    Assessment / Plan: 1. paroxysmal atrial fibrillation 2. tachybradycardia syndrome status post pacemaker 3. prior TIA 4. obstructive sleep apnea 5. recent surgery for recurrent small bowel obstruction in April 2015 6. chronic diastolic heart failure.  Echocardiogram 02/05/14 showed ejection fraction 55-60% with mild aortic insufficiency and mild mitral regurgitation 7. diarrhea, chronic.  Positive C. difficile  Disposition: Stay on just 20 of Lasix daily for now unless her chest x-ray shows significant fluid excess.  In the past she has had orthostatic hypotension and syncope from dehydration related to her diarrhea.  Her weight is down 9 pounds since last visit Continue current medication and be rechecked in 2 months  for office visit and EKG

## 2014-07-02 NOTE — Assessment & Plan Note (Signed)
Today she is back in atrial fibrillation.  She is unaware of her rhythm.  She has a paced ventricular rhythm.  She has been more short of breath recently which may reflect the fact that she has lost her atrial kick.  The stress of dealing with her constant diarrhea may have precipitated her atrial fibrillation.

## 2014-07-02 NOTE — Patient Instructions (Signed)
Your physician recommends that you continue on your current medications as directed. Please refer to the Current Medication list given to you today.  Your physician recommends that you schedule a follow-up appointment in: 2 month ov/ekg   A chest x-ray takes a picture of the organs and structures inside the chest, including the heart, lungs, and blood vessels. This test can show several things, including, whether the heart is enlarges; whether fluid is building up in the lungs; and whether pacemaker / defibrillator leads are still in place. [place]

## 2014-07-02 NOTE — Assessment & Plan Note (Signed)
Currently being treated with oral vancomycin for her C. difficile colitis

## 2014-07-02 NOTE — Assessment & Plan Note (Signed)
Blood pressure is normal on current therapy.  She is more short of breath.  Her weight is down 9 pounds.  Exam does not reveal any excess edema.  We will get a chest x-ray.

## 2014-07-03 ENCOUNTER — Other Ambulatory Visit: Payer: Medicare Other

## 2014-07-03 DIAGNOSIS — R197 Diarrhea, unspecified: Secondary | ICD-10-CM

## 2014-07-04 LAB — CLOSTRIDIUM DIFFICILE BY PCR: CDIFFPCR: NOT DETECTED

## 2014-07-05 ENCOUNTER — Telehealth: Payer: Self-pay

## 2014-07-05 NOTE — Telephone Encounter (Signed)
RE: Yolanda Spears DOB: Dec 16, 1934 MRN: [id number]   Dear Dr Mare Ferrari,    We have scheduled the above patient for an endoscopic procedure. Our records show that she is on anticoagulation therapy.   Please advise as to the patient coming off her therapy of wafarin 5 days prior to the procedure.    Sincerely,  Dr Ulice Dash Pyrtle,MD / [facility] lpn

## 2014-07-06 ENCOUNTER — Telehealth: Payer: Self-pay

## 2014-07-06 ENCOUNTER — Ambulatory Visit
Admission: RE | Admit: 2014-07-06 | Discharge: 2014-07-06 | Disposition: A | Discharge status: Home / Self Care | Payer: Medicare Other | Source: Ambulatory Visit | Attending: Cardiology | Admitting: Cardiology

## 2014-07-06 ENCOUNTER — Telehealth: Payer: Self-pay | Admitting: *Deleted

## 2014-07-06 DIAGNOSIS — R0602 Shortness of breath: Secondary | ICD-10-CM

## 2014-07-06 NOTE — Telephone Encounter (Signed)
Spoke with pt.  She is scheduled for November 17.  She is unsure if she is going to do the procedure or not.  She is aware to let us know her decision at least a week prior.

## 2014-07-06 NOTE — Telephone Encounter (Signed)
Darlin Coco, MD       SentYoulanda Mighty 5:15 PM    To: Yolanda Spears    Cc: Virgina Evener, LPN      Message     The patient has atrial fibrillation and prior TIAs. She may stop her warfarin 5 days prior to procedure but she should Lovenox bridging through our anticoagulation laboratory. Please arrange.

## 2014-07-06 NOTE — Telephone Encounter (Signed)
Received letter from [facility] LPN regarding patient having endoscopic procedure and being on anticoagulation therapy. Reviewed by  Dr. Mare Ferrari and the patient has atrial fibrillation and prior TIAs. She may stop her warfarin 5 days prior to procedure but she should Lovenox bridging through our anticoagulation laboratory. Will forward to [facility]

## 2014-07-09 ENCOUNTER — Encounter: Payer: Self-pay | Admitting: Cardiology

## 2014-07-11 ENCOUNTER — Encounter: Payer: Self-pay | Admitting: Cardiology

## 2014-07-19 ENCOUNTER — Ambulatory Visit (AMBULATORY_SURGERY_CENTER): Payer: Self-pay | Admitting: *Deleted

## 2014-07-19 VITALS — Ht 67.0 in | Wt 176.6 lb

## 2014-07-19 DIAGNOSIS — R197 Diarrhea, unspecified: Secondary | ICD-10-CM

## 2014-07-19 NOTE — Progress Notes (Signed)
Denies allergies to eggs or soy products. Denies complications with sedation or anesthesia. Denies O2 use. Denies use of diet or weight loss medications.    

## 2014-07-23 ENCOUNTER — Ambulatory Visit (INDEPENDENT_AMBULATORY_CARE_PROVIDER_SITE_OTHER): Payer: Medicare Other | Admitting: *Deleted

## 2014-07-23 DIAGNOSIS — Z23 Encounter for immunization: Secondary | ICD-10-CM

## 2014-07-23 DIAGNOSIS — Z5181 Encounter for therapeutic drug level monitoring: Secondary | ICD-10-CM

## 2014-07-23 DIAGNOSIS — I4891 Unspecified atrial fibrillation: Secondary | ICD-10-CM

## 2014-07-23 LAB — POCT INR: INR: 3.4

## 2014-07-23 MED ORDER — ENOXAPARIN SODIUM 120 MG/0.8ML ~~LOC~~ SOLN: 120.0000 mg | SUBCUTANEOUS | Status: DC

## 2014-07-23 NOTE — Patient Instructions (Signed)
07/23/14- skip today's dose of Coumadin   07/24/14- Take 1/2 pill of Coumadin  07/25/14- Take 1 pill of Coumadin  07/26/14- Do Nothing   07/27/14- Start Lovenox 120mg  injections into fatty abdominal tissue 2 inches away from navel, rotate sites at 9am once daily.   07/28/14- Continue Lovenox 120mg   injections into fatty abdominal tissue 2 inches away from navel, rotate sites at 9am once daily.   07/29/14- Continue Lovenox 120mg   injections into fatty abdominal tissue 2 inches away from navel, rotate sites at 9am once daily  07/30/14-  Continue Lovenox 120mg   injections into fatty abdominal tissue 2 inches away from navel, rotate sites at 9am once daily  07/31/14- Day of procedure, do not take any Lovenox injections. Resume Coumadin and Lovenox after Endoscopy per Dr Venita Sheffield instructions.  Usually resume Coumadin night after procedure, when you restart take an extra 1/2 tablet along with your normal dose for 2 days then resume normal dosing.   restart Lovenox 120mg  injection on 08/01/14 at 9am if ok with Dr Raquel James. Continue both Coumadin and Lovenox daily until follow up appointment on 08/06/14

## 2014-07-25 ENCOUNTER — Telehealth: Payer: Self-pay | Admitting: Internal Medicine

## 2014-07-25 NOTE — Telephone Encounter (Signed)
Called patient to discuss diarrhea.  Better after c diff treatment with vanco.  C diff neg on 07/03/2014. Still with intermittent loose stools, unpredictability. Some days are better than others. Appetite has been decreased, but weight loss has stabilized without significant further weight loss. Denies blood in her stool or melena. No abdominal pain. Fevers or chills. Endoscopy planned for next week for small bowel biopsies. Previous positive Gliadin ab and equivocal ttg Celiac disease may in fact be the last piece of the puzzle to explain her loose stools, now that C. Difficile has been effectively treated Time provided for questions and answers. She thanked me for the call She has instructions from anticoagulation clinic on Lovenox bridge and holding warfarin in the peri-procedure period.

## 2014-07-31 ENCOUNTER — Other Ambulatory Visit: Payer: Self-pay | Admitting: Cardiology

## 2014-07-31 ENCOUNTER — Encounter: Payer: Self-pay | Admitting: Internal Medicine

## 2014-07-31 ENCOUNTER — Ambulatory Visit (AMBULATORY_SURGERY_CENTER): Payer: Medicare Other | Admitting: Internal Medicine

## 2014-07-31 VITALS — BP 125/68 | HR 69 | Temp 95.8°F | Resp 79 | Ht 67.0 in | Wt 176.0 lb

## 2014-07-31 DIAGNOSIS — R6889 Other general symptoms and signs: Secondary | ICD-10-CM

## 2014-07-31 DIAGNOSIS — R197 Diarrhea, unspecified: Secondary | ICD-10-CM

## 2014-07-31 DIAGNOSIS — K319 Disease of stomach and duodenum, unspecified: Secondary | ICD-10-CM

## 2014-07-31 DIAGNOSIS — R768 Other specified abnormal immunological findings in serum: Secondary | ICD-10-CM

## 2014-07-31 MED ORDER — SODIUM CHLORIDE 0.9 % IV SOLN: 500.0000 mL | INTRAVENOUS | Status: DC

## 2014-07-31 NOTE — Progress Notes (Signed)
Pt's son took his mothers canvace tote bag and walker with him.maw  Pt was delayed leaving, waiting to speak with dr. Hilarie Fredrickson. maw

## 2014-07-31 NOTE — Op Note (Signed)
[facility]  [address] [place], [address]   ENDOSCOPY PROCEDURE REPORT  PATIENT: Yolanda Spears, Yolanda Spears  MR#: [id number] BIRTHDATE: [birth year] , 79  yrs. old GENDER: female ENDOSCOPIST: Jerene Bears, MD PROCEDURE DATE:  07/31/2014 PROCEDURE:  EGD w/ biopsy ASA CLASS:     Class III INDICATIONS:  diarrhea and positive celiac antibody by serum testing. MEDICATIONS: Monitored anesthesia care and Propofol 100 mg IV TOPICAL ANESTHETIC: none  DESCRIPTION OF PROCEDURE: After the risks benefits and alternatives of the procedure were thoroughly explained, informed consent was obtained.  The LB [id number] endoscope was introduced through the mouth and advanced to the second portion of the duodenum , Without limitations.  The instrument was slowly withdrawn as the mucosa was fully examined.  ESOPHAGUS: The mucosa of the esophagus appeared normal.  STOMACH: The mucosa of the stomach appeared normal.  DUODENUM: The duodenal mucosa showed no mucosal abnormalities in the bulb and 2nd part of the duodenum. In the second portion there was mild dark staining of the villi. Cold forcep biopsies were taken in the second portion.  Retroflexed views revealed no abnormalities.     The scope was then withdrawn from the patient and the procedure completed.  COMPLICATIONS: There were no immediate complications.  ENDOSCOPIC IMPRESSION: 1.   The mucosa of the esophagus appeared normal 2.   The mucosa of the stomach appeared normal 3.   The duodenal mucosa showed no abnormalities in the bulb and 2nd part of the duodenum cold forcep biopsies were taken in the second portion  RECOMMENDATIONS: Await biopsy results   eSigned:  Jerene Bears, MD 07/31/2014 11:15 AM    LM:BEMLJ Noah Delaine, MD, The Patient

## 2014-07-31 NOTE — Progress Notes (Signed)
Called to room to assist during endoscopic procedure.  Patient ID and intended procedure confirmed with present staff. Received instructions for my participation in the procedure from the performing physician.  

## 2014-07-31 NOTE — Patient Instructions (Addendum)
YOU HAD AN ENDOSCOPIC PROCEDURE TODAY AT THE [facility]: Refer to the procedure report that was given to you for any specific questions about what was found during the examination.  If the procedure report does not answer your questions, please call your gastroenterologist to clarify.  If you requested that your care partner not be given the details of your procedure findings, then the procedure report has been included in a sealed envelope for you to review at your convenience later.  YOU SHOULD EXPECT: Some feelings of bloating in the abdomen. Passage of more gas than usual.  Walking can help get rid of the air that was put into your GI tract during the procedure and reduce the bloating. If you had a lower endoscopy (such as a colonoscopy or flexible sigmoidoscopy) you may notice spotting of blood in your stool or on the toilet paper. If you underwent a bowel prep for your procedure, then you may not have a normal bowel movement for a few days.  DIET: Your first meal following the procedure should be a light meal and then it is ok to progress to your normal diet.  A half-sandwich or bowl of soup is an example of a good first meal.  Heavy or fried foods are harder to digest and may make you feel nauseous or bloated.  Likewise meals heavy in dairy and vegetables can cause extra gas to form and this can also increase the bloating.  Drink plenty of fluids but you should avoid alcoholic beverages for 24 hours.  ACTIVITY: Your care partner should take you home directly after the procedure.  You should plan to take it easy, moving slowly for the rest of the day.  You can resume normal activity the day after the procedure however you should NOT DRIVE or use heavy machinery for 24 hours (because of the sedation medicines used during the test).    SYMPTOMS TO REPORT IMMEDIATELY: A gastroenterologist can be reached at any hour.  During normal business hours, 8:30 AM to 5:00 PM Monday through Friday,  call [phone].  After hours and on weekends, please call the GI answering service at [phone] who will take a message and have the physician on call contact you.    Following upper endoscopy (EGD)  Vomiting of blood or coffee ground material  New chest pain or pain under the shoulder blades  Painful or persistently difficult swallowing  New shortness of breath  Fever of 100F or higher  Black, tarry-looking stools  FOLLOW UP: If any biopsies were taken you will be contacted by phone or by letter within the next 1-3 weeks.  Call your gastroenterologist if you have not heard about the biopsies in 3 weeks.  Our staff will call the home number listed on your records the next business day following your procedure to check on you and address any questions or concerns that you may have at that time regarding the information given to you following your procedure. This is a courtesy call and so if there is no answer at the home number and we have not heard from you through the emergency physician on call, we will assume that you have returned to your regular daily activities without incident.  SIGNATURES/CONFIDENTIALITY: You and/or your care partner have signed paperwork which will be entered into your electronic medical record.  These signatures attest to the fact that that the information above on your After Visit Summary has been reviewed and is understood.  Full   responsibility of the confidentiality of this discharge information lies with you and/or your care-partner.      You may resume your current medications today.  Please follow the coumadin clinic regiment that you already have per Dr. Hilarie Fredrickson. Await biopsy results. Please call if any questions or concerns.

## 2014-07-31 NOTE — Progress Notes (Signed)
Patient awakening,vss,report to rn 

## 2014-07-31 NOTE — Progress Notes (Signed)
No problems noted in the recovery room. maw 

## 2014-08-01 ENCOUNTER — Telehealth: Payer: Self-pay | Admitting: *Deleted

## 2014-08-01 NOTE — Telephone Encounter (Signed)
  Follow up Call-  Call back number 07/31/2014  Post procedure Call Back phone  # [phone]  Permission to leave phone message Yes     Patient questions:  Do you have a fever, pain , or abdominal swelling? No. Pain Score  0 *  Have you tolerated food without any problems? Yes.    Have you been able to return to your normal activities? Yes.    Do you have any questions about your discharge instructions: Diet   No. Medications  Yes.   Follow up visit  No.  Do you have questions or concerns about your Care? Yes.    Actions: * If pain score is 4 or above: No action needed, pain <4.  Pt. States that she slept from 4PM until this am.  Wondered if that was normal.  Pt. Speech clear and she is totally coherent.  No abdominal discomfort.  Advised to call later today If she remains sleepy and thinks she needs attention regarding this.

## 2014-08-06 ENCOUNTER — Ambulatory Visit (INDEPENDENT_AMBULATORY_CARE_PROVIDER_SITE_OTHER): Payer: Medicare Other | Admitting: Pharmacist

## 2014-08-06 DIAGNOSIS — Z5181 Encounter for therapeutic drug level monitoring: Secondary | ICD-10-CM

## 2014-08-06 DIAGNOSIS — I4891 Unspecified atrial fibrillation: Secondary | ICD-10-CM

## 2014-08-06 LAB — POCT INR: INR: 3.4

## 2014-08-14 ENCOUNTER — Telehealth: Payer: Self-pay | Admitting: Internal Medicine

## 2014-08-14 DIAGNOSIS — R197 Diarrhea, unspecified: Secondary | ICD-10-CM

## 2014-08-14 DIAGNOSIS — R109 Unspecified abdominal pain: Secondary | ICD-10-CM

## 2014-08-14 NOTE — Telephone Encounter (Signed)
Called patient to discuss pathology results, left message on machine

## 2014-08-16 ENCOUNTER — Other Ambulatory Visit: Payer: Self-pay

## 2014-08-16 ENCOUNTER — Encounter (HOSPITAL_COMMUNITY): Payer: Self-pay | Admitting: Internal Medicine

## 2014-08-16 ENCOUNTER — Encounter: Payer: Self-pay | Admitting: Internal Medicine

## 2014-08-16 MED ORDER — CHOLESTYRAMINE LIGHT 4 G PO PACK: 4.0000 g | PACK | Freq: Every day | ORAL | Status: DC

## 2014-08-16 NOTE — Addendum Note (Signed)
Addended by: Rosanne Sack R on: 08/16/2014 01:41 PM   Modules accepted: Orders

## 2014-08-16 NOTE — Telephone Encounter (Signed)
Spoke with pt and she is aware. Script sent to the pharmacy. Pt to come for labs.

## 2014-08-16 NOTE — Telephone Encounter (Signed)
Patient still having loose stools, though diarrhea started yesterday and has been more severe today Is using Imodium without much benefit No abdominal pain or bleeding We reviewed pathology results, small bowel biopsies normal without evidence for celiac disease as possibly suggested by celiac antibody Repeat C. difficile PCR stool, check UA with culture (patient states mild dysuria recently, I do not want to treat empirically given history of C. Difficile) Begin Questran 4 g once daily at noon Imodium when necessary for breakthrough loose stools I advised she take Questran at noon she takes her other medicines at 9 AM, 16:30 and 23:00 daily

## 2014-08-20 ENCOUNTER — Other Ambulatory Visit: Payer: Medicare Other

## 2014-08-20 ENCOUNTER — Ambulatory Visit (INDEPENDENT_AMBULATORY_CARE_PROVIDER_SITE_OTHER): Payer: Medicare Other | Admitting: *Deleted

## 2014-08-20 DIAGNOSIS — I4891 Unspecified atrial fibrillation: Secondary | ICD-10-CM

## 2014-08-20 DIAGNOSIS — Z5181 Encounter for therapeutic drug level monitoring: Secondary | ICD-10-CM

## 2014-08-20 DIAGNOSIS — R109 Unspecified abdominal pain: Secondary | ICD-10-CM

## 2014-08-20 DIAGNOSIS — R197 Diarrhea, unspecified: Secondary | ICD-10-CM

## 2014-08-20 LAB — POCT INR: INR: 2

## 2014-08-21 ENCOUNTER — Other Ambulatory Visit: Payer: Self-pay

## 2014-08-21 LAB — URINALYSIS W MICROSCOPIC + REFLEX CULTURE
BILIRUBIN URINE: NEGATIVE
CRYSTALS: NONE SEEN
Casts: NONE SEEN
Glucose, UA: NEGATIVE mg/dL
Hgb urine dipstick: NEGATIVE
Ketones, ur: NEGATIVE mg/dL
Nitrite: POSITIVE — AB
Protein, ur: NEGATIVE mg/dL
SPECIFIC GRAVITY, URINE: 1.01 (ref 1.005–1.030)
SQUAMOUS EPITHELIAL / LPF: NONE SEEN
UROBILINOGEN UA: 0.2 mg/dL (ref 0.0–1.0)
WBC, UA: >50 WBC/hpf — AB (ref <3)
pH: 5 (ref 5.0–8.0)

## 2014-08-21 LAB — CLOSTRIDIUM DIFFICILE BY PCR: Toxigenic C. Difficile by PCR: DETECTED — CR

## 2014-08-21 MED ORDER — SULFAMETHOXAZOLE-TRIMETHOPRIM 800-160 MG PO TABS: 1.0000 | ORAL_TABLET | Freq: Two times a day (BID) | ORAL | Status: DC

## 2014-08-22 ENCOUNTER — Telehealth: Payer: Self-pay | Admitting: Internal Medicine

## 2014-08-22 LAB — URINE CULTURE: Colony Count: >=100000

## 2014-08-22 NOTE — Telephone Encounter (Signed)
Patient advised that she is fine to take Bactrim and Dificid together.

## 2014-08-23 ENCOUNTER — Telehealth: Payer: Self-pay | Admitting: Internal Medicine

## 2014-08-23 NOTE — Telephone Encounter (Signed)
Calling about the ATB

## 2014-08-24 NOTE — Telephone Encounter (Signed)
Pt confused regarding the process of trying to obtain coverage for dificid from [employer]. Call placed to [employer] and they are going to call the pt back and speak with her again to clear up any confusion.

## 2014-08-27 ENCOUNTER — Telehealth: Payer: Self-pay

## 2014-08-27 MED ORDER — VANCOMYCIN 50 MG/ML ORAL SOLUTION: ORAL | Status: DC

## 2014-08-27 NOTE — Telephone Encounter (Signed)
Pt cannot afford dificid and the grant money is no longer available. Per Dr. Hilarie Fredrickson pt needs to take vancomycin 125mg  QID and then taper to 125mg  every 3 days times 10 doses. Pt aware and script sent to pharmacy.

## 2014-08-28 ENCOUNTER — Telehealth: Payer: Self-pay

## 2014-08-28 NOTE — Telephone Encounter (Signed)
Spoke with [facility] and they can do the script with the new directions below. Pt will now taper using the directions of vancomycin 125mg  every 2 days for 14 days.

## 2014-08-28 NOTE — Telephone Encounter (Signed)
-----   Message from Jerene Bears, MD sent at 08/27/2014  4:29 PM EST ----- Regarding: RE: Vancomycin Every 2 days for 14 days.  Will that work better?  ----- Message -----    From: Maury Dus, RN    Sent: 08/27/2014   3:03 PM      To: Jerene Bears, MD Subject: Vancomycin                                     Dr. Edrick Kins,  I just got a call from [facility] and they can do the 125mg  qid for 10days fine no problem.  The 125mg  q 3 days times 10doses is a problem for them due to drug stability. Per the pharmacy the compound is only stable for 2 weeks and the 2 week supply would only be 54ml which would be difficult for them. If we did 125mg  tablets for the taper it would be a $300 copay for her. Do you want to do a different taper?  Dag

## 2014-09-03 ENCOUNTER — Telehealth: Payer: Self-pay | Admitting: Internal Medicine

## 2014-09-03 MED ORDER — VANCOMYCIN 50 MG/ML ORAL SOLUTION: ORAL | Status: DC

## 2014-09-03 NOTE — Telephone Encounter (Signed)
Pt ready for taper of vancomycin to be sent to [facility] prescription sent as directed

## 2014-09-12 ENCOUNTER — Ambulatory Visit (INDEPENDENT_AMBULATORY_CARE_PROVIDER_SITE_OTHER): Payer: Medicare Other | Admitting: *Deleted

## 2014-09-12 ENCOUNTER — Ambulatory Visit (INDEPENDENT_AMBULATORY_CARE_PROVIDER_SITE_OTHER): Payer: Medicare Other | Admitting: Cardiology

## 2014-09-12 ENCOUNTER — Encounter: Payer: Self-pay | Admitting: Cardiology

## 2014-09-12 VITALS — BP 144/60 | HR 64 | Ht 67.0 in | Wt 179.0 lb

## 2014-09-12 DIAGNOSIS — I4891 Unspecified atrial fibrillation: Secondary | ICD-10-CM

## 2014-09-12 DIAGNOSIS — Z5181 Encounter for therapeutic drug level monitoring: Secondary | ICD-10-CM

## 2014-09-12 DIAGNOSIS — I480 Paroxysmal atrial fibrillation: Secondary | ICD-10-CM

## 2014-09-12 DIAGNOSIS — I119 Hypertensive heart disease without heart failure: Secondary | ICD-10-CM

## 2014-09-12 DIAGNOSIS — B9689 Other specified bacterial agents as the cause of diseases classified elsewhere: Secondary | ICD-10-CM

## 2014-09-12 DIAGNOSIS — A498 Other bacterial infections of unspecified site: Secondary | ICD-10-CM

## 2014-09-12 LAB — POCT INR: INR: 2

## 2014-09-12 NOTE — Patient Instructions (Signed)
STOP IRON   Your physician wants you to follow-up in: [place] will receive a reminder letter in the mail two months in advance. If you don't receive a letter, please call our office to schedule the follow-up appointment.

## 2014-09-12 NOTE — Progress Notes (Signed)
Yolanda Spears Date of Birth:  [birth year] [facility] [address] [place], [place]  [address] [phone]        Fax   [phone]   History of Present Illness: This pleasant 80 year old woman is seen for a scheduled followup office visit. The patient has a past history of tachybradycardia syndrome and had a pacemaker implanted in 2007. She had a pacemaker generator change by Dr. Caryl Comes on 03/23/12. She is on chronic Coumadin anticoagulation.  She has a past history of paroxysmal atrial fibrillation and is on generic Rythmol..  The patient has not been having any lightheadedness or syncope. She has not been aware of any racing of her heart. No palpitations. He does have chronic positional vertigo when she changes positions or rolls over in bed. She has a history of peripheral neuropathy and sees Dr. Krista Blue who has her on Lyrica. She sleeps poorly. She has been told that she has sleep apnea. She tried a CPAP machine years ago but was unable to tolerate it.  The patient underwent abdominal surgery in April 2015 for recurrent small bowel obstruction.  Following surgery she went to a nursing facility.  While there she went into congestive heart failure with severe dyspnea hypoxemia and peripheral edema.  Lasix was adjusted and she improved over the next several weeks.  She feels now that she is back to baseline in terms of her breathing.  She is having chronic diarrhea.  She is being treated with medication for C. difficile with vancomycin.  Dr. Hilarie Fredrickson is her gastroenterologist.  Current Outpatient Prescriptions  Medication Sig Dispense Refill  . calcium carbonate (TUMS - DOSED IN MG ELEMENTAL CALCIUM) 500 MG chewable tablet Chew 1 tablet by mouth daily.    . Cholecalciferol (VITAMIN D-3) 5000 UNITS TABS Take 1 tablet by mouth 4 (four) times a week. Takes on Saturday, Sunday, Tuesday and Thursday    . furosemide (LASIX) 40 MG tablet TAKE 1 TABLET BY MOUTH EVERY DAY 90 tablet 0  .  hydrALAZINE (APRESOLINE) 25 MG tablet Take 1 tablet (25 mg total) by mouth 3 (three) times daily. 270 tablet 1  . lisinopril (PRINIVIL,ZESTRIL) 10 MG tablet Take 1 tablet (10 mg total) by mouth 2 (two) times daily. 180 tablet 1  . loperamide (IMODIUM A-D) 2 MG tablet Take 4 mg by mouth 3 (three) times daily as needed.    . metoprolol succinate (TOPROL-XL) 100 MG 24 hr tablet Take 100 mg by mouth every morning. Take with or immediately following a meal.    . potassium chloride SA (K-DUR,KLOR-CON) 10 MEQ tablet Take 1 tablet (10 mEq total) by mouth daily. 30 tablet 11  . pregabalin (LYRICA) 75 MG capsule Take 75 mg by mouth 2 (two) times daily.    . propafenone (RYTHMOL) 225 MG tablet Take 225 mg by mouth every 8 (eight) hours.    . vancomycin (VANCOCIN) 50 mg/mL oral solution Take 125 mg 4 times daily every 2 days for 14 days 140 mL 0  . warfarin (COUMADIN) 2.5 MG tablet Take 1 tablet by mouth daily or as directed 30 tablet 5  . ZETIA 10 MG tablet TAKE 1/2 TABLET BY MOUTH EVERY DAY 30 tablet 1   No current facility-administered medications for this visit.    Allergies  Allergen Reactions  . Other Hives    Contrast dye   . Penicillins Itching  . Shellfish-Derived Products Nausea And Vomiting  . Statins     Myalgias,elevated LFT  studies  . Iohexol Hives and Itching    Itching in eyes, one hive on face.    Patient Active Problem List   Diagnosis Date Noted  . Atrial fibrillation 11/24/2010    Priority: Medium  . Clostridium difficile infection 06/01/2014  . Chronic diarrhea 04/04/2014  . Diarrhea 03/30/2014  . Paroxysmal atrial fibrillation 03/30/2014  . Internal hemorrhoids with irritation 01/04/2014  . 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 Encounter for therapeutic drug monitoring 10/05/2013  . Sleep apnea 03/29/2013  . History of adenomatous polyp of colon 04/07/2012  . Orthostatic lightheadedness 11/27/2011  . Partial  small bowel obstruction - recurrent - s/p lysis of adhesions 12/28/2013 09/14/2011  . Vertigo 08/10/2011  . Hypercholesterolemia 08/10/2011  . TIA (transient ischemic attack) 04/28/2011  . Benign hypertensive heart disease without heart failure 01/09/2011  . First degree AV block 11/26/2010  . Pacemaker- Dual-chamber-mdt 11/26/2010  . Tachycardia-bradycardia syndrome 11/26/2010    History  Smoking status  . Former Smoker -- 1.00 packs/day for 40 years  . Types: Cigarettes  . Quit date: 11/26/1998  Smokeless tobacco  . Never Used    History  Alcohol Use No    Comment: 09/14/11 "last drink ~ 08/31/2009"    Family History  Problem Relation Age of Onset  . Cancer Mother 19    UTERINE  . Breast cancer Mother 12  . Hypertension Mother   . Uterine cancer Mother   . Cancer Father     PROSTATE  . Hypertension Father   . Colon cancer Neg Hx   . Esophageal cancer Neg Hx   . Rectal cancer Neg Hx   . Stomach cancer Neg Hx     Review of Systems: Constitutional: no fever chills diaphoresis or fatigue or change in weight.  Head and neck: no hearing loss, no epistaxis, no photophobia or visual disturbance. Respiratory: No cough, shortness of breath or wheezing. Cardiovascular: No chest pain peripheral edema, palpitations. Gastrointestinal: No abdominal distention, no abdominal pain, no change in bowel habits hematochezia or melena. Genitourinary: No dysuria, no frequency, no urgency, no nocturia. Musculoskeletal:No arthralgias, no back pain, no gait disturbance or myalgias. Neurological: No dizziness, no headaches, no numbness, no seizures, no syncope, no weakness, no tremors. Hematologic: No lymphadenopathy, no easy bruising. Psychiatric: No confusion, no hallucinations, no sleep disturbance.    Physical Exam: Filed Vitals:   09/12/14 1440  BP: 144/60  Pulse: 64   The general appearance reveals a well-developed well-nourished woman in no distress.The head and neck exam  reveals pupils equal and reactive.  Extraocular movements are full.  There is no scleral icterus.  The mouth and pharynx are normal.  The neck is supple.  The carotids reveal no bruits.  The jugular venous pressure is normal.  The  thyroid is not enlarged.  There is no lymphadenopathy.  The chest is clear to percussion and auscultation.  There are no rales or rhonchi.  Expansion of the chest is symmetrical.  The precordium is quiet.  The pulse is regular.  The first heart sound is normal.  The second heart sound is physiologically split.  There is a grade 2/6 systolic ejection murmur at the base.  There is no abnormal lift or heave.  The abdomen is soft and nontender.  The bowel sounds are normal.  The liver and spleen are not enlarged.  There are no abdominal masses.  There are no abdominal bruits.  Extremities reveal good pedal pulses.  There  is no phlebitis.  There is 1+ ankle edema.  There is no cyanosis or clubbing.  Strength is normal and symmetrical in all extremities.  There is no lateralizing weakness.  There are no sensory deficits.  The skin is warm and dry.  There is no rash.  EKG today shows AV sequential paced rhythm.  Atrial fibrillation is no longer present.  Assessment / Plan: 1. paroxysmal atrial fibrillation.  EKG today confirms that she is in normal sinus rhythm.  She is on long-term warfarin.  Her Chadds Vasc score is 7 2. tachybradycardia syndrome status post pacemaker 3. prior TIA 4. obstructive sleep apnea.  She does not use a CPAP machine 5. recent surgery for recurrent small bowel obstruction in April 2015 6. chronic diastolic heart failure.  Echocardiogram 02/05/14 showed ejection fraction 55-60% with mild aortic insufficiency and mild mitral regurgitation 7. diarrhea, chronic.  Positive C. Difficile.  She is now on a new regimen of taking vancomycin every 6 hours on alternate days  Disposition: Continue current medication except she can stop taking the iron tablets now. She  will be getting lab work at her PCPs office in the near future. Her C. difficile is being managed by her gastroenterologist. Recheck here in 4 months for follow-up office visit

## 2014-09-20 ENCOUNTER — Other Ambulatory Visit: Payer: Self-pay | Admitting: Cardiology

## 2014-09-20 ENCOUNTER — Telehealth: Payer: Self-pay | Admitting: Internal Medicine

## 2014-09-20 DIAGNOSIS — R197 Diarrhea, unspecified: Secondary | ICD-10-CM

## 2014-09-20 NOTE — Telephone Encounter (Signed)
Pt states that she is coming to the end of her prescription for vancomycin and she wants to know what is the next step. States she thinks she will be out Saturday. Pt states she is still having diarrhea. Please advise.

## 2014-09-21 NOTE — Telephone Encounter (Signed)
If she is still having diarrhea, we need to repeat C. difficile PCR stat If C. difficile still positive she needs to be seen by Dr. Carlyle Basques with ID and we need to discuss FMT Issue using any cholestyramine?

## 2014-09-21 NOTE — Telephone Encounter (Signed)
Spoke with pt and she is aware. Pt will come to the lab for another cdiff test.

## 2014-09-25 ENCOUNTER — Other Ambulatory Visit: Payer: Medicare Other

## 2014-09-27 ENCOUNTER — Ambulatory Visit (INDEPENDENT_AMBULATORY_CARE_PROVIDER_SITE_OTHER): Payer: Medicare Other | Admitting: *Deleted

## 2014-09-27 ENCOUNTER — Encounter: Payer: Self-pay | Admitting: Internal Medicine

## 2014-09-27 DIAGNOSIS — I495 Sick sinus syndrome: Secondary | ICD-10-CM

## 2014-09-27 NOTE — Progress Notes (Signed)
Remote pacemaker transmission.   

## 2014-10-01 ENCOUNTER — Ambulatory Visit: Payer: Medicare Other | Admitting: Podiatry

## 2014-10-01 LAB — MDC_IDC_ENUM_SESS_TYPE_REMOTE
Battery Remaining Longevity: 118 mo
Battery Voltage: 2.78 V
Brady Statistic AS VP Percent: 23 %
Brady Statistic AS VS Percent: 1 %
Date Time Interrogation Session: 20160121163322
Lead Channel Impedance Value: 562 Ohm
Lead Channel Pacing Threshold Pulse Width: 0.4 ms
Lead Channel Pacing Threshold Pulse Width: 0.4 ms
Lead Channel Setting Pacing Amplitude: 2.5 V
MDC IDC MSMT BATTERY IMPEDANCE: 156 Ohm
MDC IDC MSMT LEADCHNL RA IMPEDANCE VALUE: 530 Ohm
MDC IDC MSMT LEADCHNL RA PACING THRESHOLD AMPLITUDE: 0.625 V
MDC IDC MSMT LEADCHNL RA SENSING INTR AMPL: 1.4 mV
MDC IDC MSMT LEADCHNL RV PACING THRESHOLD AMPLITUDE: 0.5 V
MDC IDC SET LEADCHNL RA PACING AMPLITUDE: 2 V
MDC IDC SET LEADCHNL RV PACING PULSEWIDTH: 0.4 ms
MDC IDC SET LEADCHNL RV SENSING SENSITIVITY: 2.8 mV
MDC IDC STAT BRADY AP VP PERCENT: 76 %
MDC IDC STAT BRADY AP VS PERCENT: 0 %

## 2014-10-02 ENCOUNTER — Telehealth: Payer: Self-pay | Admitting: Cardiology

## 2014-10-02 NOTE — Telephone Encounter (Signed)
Spoke w/ pt and pt informed me that she was not coming to office on 10-05-14 to upgrade home monitor.

## 2014-10-03 ENCOUNTER — Other Ambulatory Visit: Payer: Medicare Other

## 2014-10-03 DIAGNOSIS — R197 Diarrhea, unspecified: Secondary | ICD-10-CM

## 2014-10-04 LAB — CLOSTRIDIUM DIFFICILE BY PCR: CDIFFPCR: DETECTED — AB

## 2014-10-05 ENCOUNTER — Other Ambulatory Visit: Payer: Self-pay

## 2014-10-05 ENCOUNTER — Telehealth: Payer: Self-pay | Admitting: Internal Medicine

## 2014-10-05 DIAGNOSIS — A0472 Enterocolitis due to Clostridium difficile, not specified as recurrent: Secondary | ICD-10-CM

## 2014-10-05 MED ORDER — VANCOMYCIN 50 MG/ML ORAL SOLUTION: ORAL | Status: DC

## 2014-10-05 NOTE — Telephone Encounter (Signed)
Pt states her PCP has called in  Septra for a bladder infection. Pt wants to know if it is ok for her to take Septra along with the Vancomycin. Please advise.

## 2014-10-05 NOTE — Telephone Encounter (Signed)
Yes, okay for both

## 2014-10-05 NOTE — Telephone Encounter (Signed)
Pt aware.

## 2014-10-10 ENCOUNTER — Encounter: Payer: Self-pay | Admitting: Podiatry

## 2014-10-10 ENCOUNTER — Ambulatory Visit (INDEPENDENT_AMBULATORY_CARE_PROVIDER_SITE_OTHER): Payer: Medicare Other | Admitting: Surgery

## 2014-10-10 ENCOUNTER — Ambulatory Visit (INDEPENDENT_AMBULATORY_CARE_PROVIDER_SITE_OTHER): Payer: Medicare Other | Admitting: Podiatry

## 2014-10-10 DIAGNOSIS — I4891 Unspecified atrial fibrillation: Secondary | ICD-10-CM

## 2014-10-10 DIAGNOSIS — M79676 Pain in unspecified toe(s): Secondary | ICD-10-CM

## 2014-10-10 DIAGNOSIS — B351 Tinea unguium: Secondary | ICD-10-CM | POA: Diagnosis not present

## 2014-10-10 DIAGNOSIS — Z5181 Encounter for therapeutic drug level monitoring: Secondary | ICD-10-CM

## 2014-10-10 LAB — POCT INR: INR: 2.2

## 2014-10-10 NOTE — Progress Notes (Signed)
Patient ID: Yolanda Spears, female   DOB: Dec 01, 1934, 80 y.o.   MRN: [id number]  Subjective: This patient presents complaining of painful toenails and walking wearing shoes  Objective: The toenails are brittle, elongated, incurvated, discolored and tender to palpation 6-10  Assessment: Symptomatic onychomycoses 6-10  Plan: Debridement toenails 10 without a bleeding  Reappoint 3 months

## 2014-10-15 ENCOUNTER — Encounter: Payer: Self-pay | Admitting: Cardiology

## 2014-10-17 ENCOUNTER — Ambulatory Visit (INDEPENDENT_AMBULATORY_CARE_PROVIDER_SITE_OTHER): Payer: Medicare Other | Admitting: *Deleted

## 2014-10-17 ENCOUNTER — Other Ambulatory Visit: Payer: Self-pay | Admitting: *Deleted

## 2014-10-17 DIAGNOSIS — I4891 Unspecified atrial fibrillation: Secondary | ICD-10-CM

## 2014-10-17 DIAGNOSIS — Z5181 Encounter for therapeutic drug level monitoring: Secondary | ICD-10-CM

## 2014-10-17 LAB — POCT INR: INR: 1.4

## 2014-10-17 MED ORDER — LISINOPRIL 10 MG PO TABS: 10.0000 mg | ORAL_TABLET | Freq: Two times a day (BID) | ORAL | Status: DC

## 2014-10-19 ENCOUNTER — Telehealth: Payer: Self-pay | Admitting: Internal Medicine

## 2014-10-19 NOTE — Telephone Encounter (Signed)
I have spoken to patient and pharmacy. [facility] as an additional prescription of vancomycin for #70 ml that should complete her taper. They can only give 2 weeks at a time of the medication because it is unstable. Patient verbalizes understanding.

## 2014-10-22 ENCOUNTER — Telehealth: Payer: Self-pay | Admitting: Internal Medicine

## 2014-10-22 NOTE — Telephone Encounter (Signed)
Would go with initial RX as stated on 10/05/14 She needs ID appt with Dr. Baxter Flattery for recurrent cdiff and then very likely FMT

## 2014-10-22 NOTE — Telephone Encounter (Signed)
Per Dr Hilarie Fredrickson verbal recommendation, he would like patient to have rx for once daily x 1 week then once every 3rd day x 1 week as originally sent. I have contacted patient to advise her of this. She verbalizes understanding.

## 2014-10-22 NOTE — Telephone Encounter (Signed)
BOTH rx's were sent on 1/29. Do you want the once daily x 1 week and then once every 3 days x 1 week?

## 2014-10-22 NOTE — Telephone Encounter (Signed)
Dr Hilarie Fredrickson- Can you please confirm correct rx for vancomycin taper you want patient on? There have been so many different scripts sent at different times (some of which the pharmacy has that I have no record of) that I want to make certain that patient is actually getting the correct rx. The Rx sent on 10/05/14 shows: Pt to take 125mg  4 times daily for 2 weeks, Then take 125mg  daily for 1 week , Then take 125mg  every 3 days for 1 week. Your note from labs 09/2714 also note this. However, apparently, a different script called in to the pharmacy (no documentation on our part was done showing a change). That scripts reads: Take 125 ml QID x 2 weeks, then 125 mg QID every other day x 1 week, then 125 mg QID every 3rd day x 2 weeks. Patient is confused about correct instructions... Please advise.Marland KitchenMarland KitchenMarland Kitchen

## 2014-10-23 ENCOUNTER — Other Ambulatory Visit: Payer: Self-pay | Admitting: Cardiology

## 2014-10-25 ENCOUNTER — Ambulatory Visit: Payer: Medicare Other | Admitting: Internal Medicine

## 2014-10-26 ENCOUNTER — Ambulatory Visit (INDEPENDENT_AMBULATORY_CARE_PROVIDER_SITE_OTHER): Payer: Medicare Other | Admitting: *Deleted

## 2014-10-26 DIAGNOSIS — I4891 Unspecified atrial fibrillation: Secondary | ICD-10-CM

## 2014-10-26 DIAGNOSIS — Z5181 Encounter for therapeutic drug level monitoring: Secondary | ICD-10-CM

## 2014-10-26 LAB — POCT INR: INR: 1.5

## 2014-11-02 ENCOUNTER — Ambulatory Visit (INDEPENDENT_AMBULATORY_CARE_PROVIDER_SITE_OTHER): Payer: Medicare Other

## 2014-11-02 DIAGNOSIS — I4891 Unspecified atrial fibrillation: Secondary | ICD-10-CM

## 2014-11-02 DIAGNOSIS — Z5181 Encounter for therapeutic drug level monitoring: Secondary | ICD-10-CM

## 2014-11-02 LAB — POCT INR: INR: 3.2

## 2014-11-09 ENCOUNTER — Encounter: Payer: Self-pay | Admitting: Internal Medicine

## 2014-11-12 ENCOUNTER — Telehealth: Payer: Self-pay | Admitting: Internal Medicine

## 2014-11-12 NOTE — Telephone Encounter (Signed)
Spoke with pt and she has an appt with her PCP tomorrow. Encouraged pt to keep that appt to see if she has a UTI. Pt verbalized understanding.

## 2014-11-16 ENCOUNTER — Ambulatory Visit (INDEPENDENT_AMBULATORY_CARE_PROVIDER_SITE_OTHER): Payer: Medicare Other | Admitting: *Deleted

## 2014-11-16 DIAGNOSIS — I4891 Unspecified atrial fibrillation: Secondary | ICD-10-CM

## 2014-11-16 DIAGNOSIS — Z5181 Encounter for therapeutic drug level monitoring: Secondary | ICD-10-CM

## 2014-11-16 LAB — POCT INR: INR: 2

## 2014-11-19 ENCOUNTER — Ambulatory Visit (INDEPENDENT_AMBULATORY_CARE_PROVIDER_SITE_OTHER): Payer: Medicare Other

## 2014-11-19 DIAGNOSIS — I4891 Unspecified atrial fibrillation: Secondary | ICD-10-CM

## 2014-11-19 DIAGNOSIS — Z5181 Encounter for therapeutic drug level monitoring: Secondary | ICD-10-CM | POA: Diagnosis not present

## 2014-11-19 LAB — POCT INR: INR: 2.8

## 2014-11-22 ENCOUNTER — Ambulatory Visit (INDEPENDENT_AMBULATORY_CARE_PROVIDER_SITE_OTHER): Payer: Medicare Other | Admitting: Internal Medicine

## 2014-11-22 VITALS — BP 138/76 | HR 92 | Temp 97.2°F | Wt 178.0 lb

## 2014-11-22 DIAGNOSIS — A047 Enterocolitis due to Clostridium difficile: Secondary | ICD-10-CM

## 2014-11-22 DIAGNOSIS — A0471 Enterocolitis due to Clostridium difficile, recurrent: Secondary | ICD-10-CM

## 2014-11-22 NOTE — Progress Notes (Signed)
Subjective:    Patient ID: Yolanda Spears, female    DOB: [birth year], 80 y.o.   MRN: [id number]  HPI Yolanda Spears is a 80yo Female with a past medical history of transient small bowel obstructions secondary to adhesive abdominal disease status post recent open LOA and small bowel/ileal resection, 18 inch of SB resected by Dr. Johney Maine in 12/28/2013. She has a history of breast cancer in situ status post double mastectomy, endometrial cancer status post hysterectomy with radiation 2006, atrial fibrillation on warfarin, colon polyps with last colonoscopy in 2011, TIAs, hypertension. She has hx of recurrent UTI.  She reports she has had significant trouble with diarrhea which all started after her abdominal surgery in April 2015. She would be having 7-8 watery bowel movements a day despite use of loperamide. Initial c.difficile test in April was negative.  She has lost roughly 30 pounds since surgery. She was referred to Dr. Hilarie Fredrickson for her chronic diarrhea on 8/31, who did stool studies and initially prescribed rifaximin for IBS. However, her gi panel showed + cdiff thus she was started on a 14day course of metronidazole staring Sep 4th 2015. Coincidentally she had + urine cx for kleb pneumo that I am not sure if she received further antibiotics at this time. She did well initially but then felt that her diarrhea returned by the beginning of October.  She was then given a course of oral vancomycin 125mg  QID x 10 days for relapse, which was confirmed by testing on 10/12. Since she still had ongoing diarrhea despite 2 wk of oral vancomycin, she underwent EGD to rule out celiac disease in November, but those tests were negative. She had another course of oral vancomycin December since repeat cdifficile was positive. And again in late January it was positive, thus given vancomycin plus taper at this time. She is referred to our clinic for evaluation for FMT.   She often defecates when she has to urinate. Having  soft bowel movements. Likely 6 x per day. She feels that vancomycin makes her go more frequently. She currently is on a course of flagyl. She previously had been on vancomycin but stopped in early February.In addition to c.difficile treatment, she is currently being treated for uti with bactrim ss bid, finishing last day of 7 day course of therapy. She sees PCP  But not yet seen by urology for evaluation of recurrent uti.  Poor recall to various episodes.  Per EMR: Positive cdifficile 9/4, 10/12, 12/14, 10/03/14  Allergies  Allergen Reactions  . Other Hives    Contrast dye   . Penicillins Itching  . Shellfish-Derived Products Nausea And Vomiting  . Statins     Myalgias,elevated LFT studies  . Iohexol Hives and Itching    Itching in eyes, one hive on face.   Current Outpatient Prescriptions on File Prior to Visit  Medication Sig Dispense Refill  . calcium carbonate (TUMS - DOSED IN MG ELEMENTAL CALCIUM) 500 MG chewable tablet Chew 1 tablet by mouth daily.    . Cholecalciferol (VITAMIN D-3) 5000 UNITS TABS Take 1 tablet by mouth 4 (four) times a week. Takes on Saturday, Sunday, Tuesday and Thursday    . furosemide (LASIX) 40 MG tablet TAKE 1 TABLET BY MOUTH EVERY DAY 90 tablet 1  . hydrALAZINE (APRESOLINE) 25 MG tablet Take 1 tablet (25 mg total) by mouth 3 (three) times daily. 270 tablet 1  . lisinopril (PRINIVIL,ZESTRIL) 10 MG tablet Take 1 tablet (10 mg total) by mouth 2 (two)  times daily. 180 tablet 3  . loperamide (IMODIUM A-D) 2 MG tablet Take 4 mg by mouth 3 (three) times daily as needed.    . metoprolol succinate (TOPROL-XL) 100 MG 24 hr tablet TAKE 1 TABLET BY MOUTH EVERY DAY WITH OR IMMEDIATELY FOLLOWING A MEAL 90 tablet 1  . potassium chloride SA (K-DUR,KLOR-CON) 10 MEQ tablet Take 1 tablet (10 mEq total) by mouth daily. 30 tablet 11  . pregabalin (LYRICA) 75 MG capsule Take 75 mg by mouth 2 (two) times daily.    . propafenone (RYTHMOL) 225 MG tablet TAKE 1 TABLET (225 MG TOTAL)  BY MOUTH EVERY 8 (EIGHT) HOURS. 270 tablet 1  . warfarin (COUMADIN) 2.5 MG tablet Take 1 tablet by mouth daily or as directed 30 tablet 5  . ZETIA 10 MG tablet TAKE 1/2 TABLET BY MOUTH EVERY DAY 30 tablet 1  . vancomycin (VANCOCIN) 50 mg/mL oral solution Pt to take 125mg  4 times daily for 2 weeks Then take 125mg  daily for 1 week Then take 125mg  every 3 days for 1 week (Patient not taking: Reported on 11/22/2014) 165 mL 0   No current facility-administered medications on file prior to visit.   Active Ambulatory Problems    Diagnosis Date Noted  . Atrial fibrillation 11/24/2010  . First degree AV block 11/26/2010  . Pacemaker- Dual-chamber-mdt 11/26/2010  . Tachycardia-bradycardia syndrome 11/26/2010  . Benign hypertensive heart disease without heart failure 01/09/2011  . TIA (transient ischemic attack) 04/28/2011  . Vertigo 08/10/2011  . Hypercholesterolemia 08/10/2011  . Partial small bowel obstruction - recurrent - s/p lysis of adhesions 12/28/2013 09/14/2011  . Orthostatic lightheadedness 11/27/2011  . History of adenomatous polyp of colon 04/07/2012  . Sleep apnea 03/29/2013  . Encounter for therapeutic drug monitoring 10/05/2013  . 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 Internal hemorrhoids with irritation 01/04/2014  . Diarrhea 03/30/2014  . Paroxysmal atrial fibrillation 03/30/2014  . Chronic diarrhea 04/04/2014  . Clostridium difficile infection 06/01/2014   Resolved Ambulatory Problems    Diagnosis Date Noted  . SBO (small bowel obstruction) 09/11/2013  . Small bowel obstruction due to adhesions 12/28/2013   Past Medical History  Diagnosis Date  . Hypertension   . Peripheral neuropathy   . High cholesterol   . Heart murmur   . Shortness of breath on exertion   . History of radiation therapy   . Partial bowel obstruction JAN/2013  . Colon polyp 2011  . Osteopenia 02/2013  . Stroke   . Arthritis   .  Personal history of skin cancer   . Urinary leakage   . Hx: UTI (urinary tract infection)   . Endometrial cancer 2006  . Breast cancer 1999  . Ringing in ears   . Hx of echocardiogram    History  Substance Use Topics  . Smoking status: Former Smoker -- 1.00 packs/day for 40 years    Types: Cigarettes    Quit date: 11/26/1998  . Smokeless tobacco: Never Used  . Alcohol Use: No     Comment: 09/14/11 "last drink ~ 08/31/2009"  family history includes Breast cancer (age of onset: 43) in her mother; Cancer in her father; Cancer (age of onset: 71) in her mother; Hypertension in her father and mother; Uterine cancer in her mother. There is no history of Colon cancer, Esophageal cancer, Rectal cancer, or Stomach cancer. Review of Systems Review of Systems  Constitutional: Negative for fever, chills, diaphoresis,  Positive in decreased activity change,  appetite change, fatigue and unexpected weight change.  HENT: Negative for congestion, sore throat, rhinorrhea, sneezing, trouble swallowing and sinus pressure.  Eyes: Negative for photophobia and visual disturbance.  Respiratory: Negative for cough, chest tightness, shortness of breath, wheezing and stridor.  Cardiovascular: Negative for chest pain, palpitations and leg swelling.  Gastrointestinal: positive for diarrhea per hpi Genitourinary: Negative for dysuria, hematuria, flank pain and difficulty urinating.  Musculoskeletal: Negative for myalgias, back pain, joint swelling, arthralgias and gait problem.  Skin: Negative for color change, pallor, rash and wound.  Neurological: Negative for dizziness, tremors, weakness and light-headedness.  Hematological: Negative for adenopathy. Does not bruise/bleed easily.  Psychiatric/Behavioral: Negative for behavioral problems, confusion, sleep disturbance, dysphoric mood, decreased concentration and agitation.       Objective:   Physical Exam BP 138/76 mmHg  Pulse 92  Temp(Src) 97.2 F (36.2 C)  (Oral)  Wt 178 lb (80.74 kg) Physical Exam  Constitutional:  oriented to person, place, and time. appears well-developed and well-nourished. No distress.  HENT:  Mouth/Throat: Oropharynx is clear and moist. No oropharyngeal exudate.  Cardiovascular: Normal rate, regular rhythm and normal heart sounds. Exam reveals no gallop and no friction rub.  No murmur heard.  Pulmonary/Chest: Effort normal and breath sounds normal. No respiratory distress.  has no wheezes.  Abdominal: Soft. Bowel sounds are normal.  exhibits no distension. There is no tenderness. Surgical scars that are well healed Lymphadenopathy: no cervical adenopathy.  Neurological: alert and oriented to person, place, and time.  Skin: Skin is warm and dry. No rash noted. No erythema.  Psychiatric: a normal mood and affect. behavior is normal.        Assessment & Plan:  Recurrent c.difficile infection = she has has >3 + c.difficile colitis episodes since Fall of 2015. It appears that she does not have episodes of being free of diarrhea, but somewhat unclear if it due to her bowel resection. It looks like on several episode she has been checked in the event that presentation may be post infectious IBS but still has persistent c.difficile found when she is symptomatic. She has not had to be hospitalized for severe diarrhea but given her age fortunately. I think she would benefit from getting FMT as long as she has minimal further exposure to antibiotics.  I have discussed this with her and her sister of options for FMT, informed consent since this is considered experimental, costs involved for stool procurement from [facility] vs. Testing stool from family member.  We will check her stool again to see if she is c.difficile positive. Plan to do 2 wk of oral vancomycin prior to [place]  In regards to recurrent uti, recommend that she goes to urology eval. May consider doing trial of estrogen creams to see if it helps.  Will coordinate with  Dr. Vena Rua office for scheduling FMT  I have spent 60 min in face to face time discussing her history and treatment options  [facility]yl [facility] [phone]

## 2014-11-27 ENCOUNTER — Other Ambulatory Visit: Payer: Medicare Other

## 2014-11-27 ENCOUNTER — Other Ambulatory Visit: Payer: Self-pay | Admitting: *Deleted

## 2014-11-27 DIAGNOSIS — Z8619 Personal history of other infectious and parasitic diseases: Secondary | ICD-10-CM

## 2014-11-28 ENCOUNTER — Other Ambulatory Visit: Payer: Self-pay | Admitting: Internal Medicine

## 2014-11-28 ENCOUNTER — Telehealth: Payer: Self-pay | Admitting: *Deleted

## 2014-11-28 DIAGNOSIS — A0471 Enterocolitis due to Clostridium difficile, recurrent: Secondary | ICD-10-CM

## 2014-11-28 LAB — CLOSTRIDIUM DIFFICILE BY PCR: Toxigenic C. Difficile by PCR: DETECTED — CR

## 2014-11-28 MED ORDER — VANCOMYCIN 50 MG/ML ORAL SOLUTION: 125.0000 mg | Freq: Four times a day (QID) | ORAL | Status: DC

## 2014-11-28 NOTE — Progress Notes (Signed)
Oral vanco 125mg  QID x 14 d called into [facility]

## 2014-11-28 NOTE — Telephone Encounter (Signed)
Will call patient. She is supposed to be on oral vancomycin

## 2014-11-28 NOTE — Telephone Encounter (Signed)
Critical Lab - stool + for c. diff by PCR.

## 2014-11-29 ENCOUNTER — Other Ambulatory Visit: Payer: Self-pay | Admitting: Cardiology

## 2014-12-03 ENCOUNTER — Other Ambulatory Visit: Payer: Self-pay | Admitting: Internal Medicine

## 2014-12-03 ENCOUNTER — Telehealth: Payer: Self-pay | Admitting: *Deleted

## 2014-12-03 DIAGNOSIS — A0471 Enterocolitis due to Clostridium difficile, recurrent: Secondary | ICD-10-CM

## 2014-12-03 MED ORDER — MOVIPREP 100 G PO SOLR: 1.0000 | Freq: Once | ORAL | Status: DC

## 2014-12-03 NOTE — Telephone Encounter (Signed)
Per Dr Hilarie Fredrickson, Dr Baxter Flattery will be out of town on 12/27/14 when he can do procedure. Therefore, Dr Baxter Flattery will get patient's stool samples prepared the week beforehand for Dr Hilarie Fredrickson to have on hand on the day of procedure. I have spoken to Yolanda Spears to advise that she is scheduled for FMT 12/27/14 @ 10:15 am [facility]. I have gone over Moviprep instructions with her (with additional information regarding taking Imodium for procedure) and have mailed these instructions to her as well. I have also asked that she continue coumadin for procedure per Dr Hilarie Fredrickson. She verbalizes understanding of all of the above and states that she will actually be using the fecal transplant bank for stool sample.

## 2014-12-03 NOTE — Telephone Encounter (Signed)
-----   Message from Jerene Bears, MD sent at 11/29/2014  6:06 PM EDT ----- Caren Griffins I have a hospital week coming up in April.  The week of April 18 This would work for both the [facility] patients if okay with you.  Dottie can you work to arrange for [facility] for my next hospital week (not Mon or Fri if possible) Thanks JMP  ----- Message -----    From: Carlyle Basques, MD    Sent: 11/28/2014   4:49 PM      To: Jerene Bears, MD  i saw her in clinic and think she would be good candidate for FMT. She still has cdiff + stool. I restarted her on vanco 125mg  QID x 14d. Let me know when you want to do her CSY/FMT.

## 2014-12-03 NOTE — Addendum Note (Signed)
Addended by: Larina Bras on: 12/03/2014 11:14 AM   Modules accepted: Orders

## 2014-12-07 ENCOUNTER — Ambulatory Visit (INDEPENDENT_AMBULATORY_CARE_PROVIDER_SITE_OTHER): Payer: Medicare Other

## 2014-12-07 DIAGNOSIS — I4891 Unspecified atrial fibrillation: Secondary | ICD-10-CM

## 2014-12-07 DIAGNOSIS — Z5181 Encounter for therapeutic drug level monitoring: Secondary | ICD-10-CM

## 2014-12-07 LAB — POCT INR: INR: 1.4

## 2014-12-08 ENCOUNTER — Other Ambulatory Visit: Payer: Self-pay | Admitting: Cardiology

## 2014-12-14 ENCOUNTER — Encounter (HOSPITAL_COMMUNITY): Payer: Self-pay | Admitting: *Deleted

## 2014-12-17 ENCOUNTER — Telehealth: Payer: Self-pay | Admitting: Internal Medicine

## 2014-12-17 NOTE — Telephone Encounter (Signed)
FMT will not suffice as colon cancer screening because the purpose of the exam is different.  We want the fecal transplant to remain in the right colon and total colon for as long as possible. For this reason we do not spent the usual time on scope withdrawal which would provide for thorough mucosal inspection.  When time is spent withdrawing the endoscope more suction is used which would draw the fecal transplanted liquid toward the rectum (which is less than desirable) That being said, if there are large polyps they will likely be seen but Yolanda Spears may require another colonoscopy once Yolanda Spears is over recurrent C. difficile for appropriate screening/polyp surveillance

## 2014-12-17 NOTE — Telephone Encounter (Signed)
Pt is scheduled for stool transplant Thursday and wants to know if Dr. Hilarie Fredrickson is going to do regular colon at the same time. Pt states she is due for colon now. Please advise.

## 2014-12-17 NOTE — Telephone Encounter (Signed)
Pt aware.

## 2014-12-21 ENCOUNTER — Ambulatory Visit (INDEPENDENT_AMBULATORY_CARE_PROVIDER_SITE_OTHER): Payer: Medicare Other | Admitting: *Deleted

## 2014-12-21 DIAGNOSIS — I4891 Unspecified atrial fibrillation: Secondary | ICD-10-CM

## 2014-12-21 DIAGNOSIS — Z5181 Encounter for therapeutic drug level monitoring: Secondary | ICD-10-CM | POA: Diagnosis not present

## 2014-12-21 LAB — POCT INR: INR: 2

## 2014-12-24 ENCOUNTER — Encounter: Payer: Self-pay | Admitting: *Deleted

## 2014-12-25 ENCOUNTER — Telehealth: Payer: Self-pay | Admitting: *Deleted

## 2014-12-25 NOTE — Telephone Encounter (Signed)
Per Dr Hilarie Fredrickson and Dr Storm Frisk request, patient should be told that she no longer needs oral vancomycin (has FMT on Thursday). I contacted patient to advise of this and she tells me that she has actually been done with her vancomycin for 1 week now.

## 2014-12-26 NOTE — Anesthesia Preprocedure Evaluation (Signed)
Anesthesia Evaluation  Patient identified by MRN, date of birth, ID band Patient awake    Reviewed: Allergy & Precautions, NPO status , Patient's Chart, lab work & pertinent test results  History of Anesthesia Complications Negative for: history of anesthetic complications  Airway Mallampati: II  TM Distance: >3 FB Neck ROM: Full    Dental no notable dental hx. (+) Dental Advisory Given   Pulmonary sleep apnea , former smoker,  breath sounds clear to auscultation  Pulmonary exam normal       Cardiovascular hypertension, Pt. on medications and Pt. on home beta blockers + dysrhythmias + pacemaker (hx of tachy/brady syndrome, s/p pacemaker in 2007) Rhythm:Regular Rate:Normal     Neuro/Psych Vertigo  TIAnegative psych ROS   GI/Hepatic negative GI ROS, Neg liver ROS,   Endo/Other  negative endocrine ROS  Renal/GU negative Renal ROS  negative genitourinary   Musculoskeletal  (+) Arthritis -,   Abdominal   Peds negative pediatric ROS (+)  Hematology negative hematology ROS (+)   Anesthesia Other Findings   Reproductive/Obstetrics negative OB ROS                             Anesthesia Physical Anesthesia Plan  ASA: III  Anesthesia Plan: MAC   Post-op Pain Management:    Induction: Intravenous  Airway Management Planned: Nasal Cannula  Additional Equipment:   Intra-op Plan:   Post-operative Plan:   Informed Consent: I have reviewed the patients History and Physical, chart, labs and discussed the procedure including the risks, benefits and alternatives for the proposed anesthesia with the patient or authorized representative who has indicated his/her understanding and acceptance.   Dental advisory given  Plan Discussed with: CRNA  Anesthesia Plan Comments:         Anesthesia Quick Evaluation

## 2014-12-27 ENCOUNTER — Ambulatory Visit (HOSPITAL_COMMUNITY)
Admission: RE | Admit: 2014-12-27 | Discharge: 2014-12-27 | Disposition: A | Discharge status: Home / Self Care | Payer: Medicare Other | Source: Ambulatory Visit | Attending: Internal Medicine | Admitting: Internal Medicine

## 2014-12-27 ENCOUNTER — Ambulatory Visit (HOSPITAL_COMMUNITY): Payer: Medicare Other | Admitting: Anesthesiology

## 2014-12-27 ENCOUNTER — Encounter (HOSPITAL_COMMUNITY)
Admission: RE | Disposition: A | Discharge status: Home / Self Care | Payer: Self-pay | Source: Ambulatory Visit | Attending: Internal Medicine

## 2014-12-27 ENCOUNTER — Encounter (HOSPITAL_COMMUNITY): Payer: Self-pay | Admitting: *Deleted

## 2014-12-27 DIAGNOSIS — A0471 Enterocolitis due to Clostridium difficile, recurrent: Secondary | ICD-10-CM | POA: Insufficient documentation

## 2014-12-27 DIAGNOSIS — Z79899 Other long term (current) drug therapy: Secondary | ICD-10-CM | POA: Insufficient documentation

## 2014-12-27 DIAGNOSIS — Z950 Presence of cardiac pacemaker: Secondary | ICD-10-CM | POA: Diagnosis not present

## 2014-12-27 DIAGNOSIS — G4730 Sleep apnea, unspecified: Secondary | ICD-10-CM | POA: Diagnosis not present

## 2014-12-27 DIAGNOSIS — I10 Essential (primary) hypertension: Secondary | ICD-10-CM | POA: Diagnosis not present

## 2014-12-27 DIAGNOSIS — A047 Enterocolitis due to Clostridium difficile: Secondary | ICD-10-CM | POA: Diagnosis not present

## 2014-12-27 DIAGNOSIS — M1990 Unspecified osteoarthritis, unspecified site: Secondary | ICD-10-CM | POA: Diagnosis not present

## 2014-12-27 DIAGNOSIS — Z87891 Personal history of nicotine dependence: Secondary | ICD-10-CM | POA: Diagnosis not present

## 2014-12-27 DIAGNOSIS — K5730 Diverticulosis of large intestine without perforation or abscess without bleeding: Secondary | ICD-10-CM | POA: Insufficient documentation

## 2014-12-27 HISTORY — PX: FECAL TRANSPLANT: SHX6383

## 2014-12-27 HISTORY — PX: COLONOSCOPY WITH PROPOFOL: SHX5780

## 2014-12-27 LAB — POCT I-STAT 4, (NA,K, GLUC, HGB,HCT)
Glucose, Bld: 82 mg/dL (ref 70–99)
HCT: 35 % — ABNORMAL LOW (ref 36.0–46.0)
Hemoglobin: 11.9 g/dL — ABNORMAL LOW (ref 12.0–15.0)
Potassium: 3.6 mmol/L (ref 3.5–5.1)
Sodium: 141 mmol/L (ref 135–145)

## 2014-12-27 SURGERY — COLONOSCOPY WITH PROPOFOL
Anesthesia: Monitor Anesthesia Care

## 2014-12-27 MED ORDER — LACTATED RINGERS IV SOLN
INTRAVENOUS | Status: DC
  Administered 2014-12-27: 1000 mL via INTRAVENOUS

## 2014-12-27 MED ORDER — PROPOFOL INFUSION 10 MG/ML OPTIME
INTRAVENOUS | Status: DC | PRN
  Administered 2014-12-27: 180 ug/kg/min via INTRAVENOUS

## 2014-12-27 MED ORDER — ONDANSETRON HCL 4 MG/2ML IJ SOLN
INTRAMUSCULAR | Status: DC | PRN
  Administered 2014-12-27: 4 mg via INTRAVENOUS

## 2014-12-27 MED ORDER — ONDANSETRON HCL 4 MG/2ML IJ SOLN
INTRAMUSCULAR | Status: AC
  Filled 2014-12-27: qty 2

## 2014-12-27 MED ORDER — LIDOCAINE HCL (CARDIAC) 20 MG/ML IV SOLN
INTRAVENOUS | Status: DC | PRN
  Administered 2014-12-27: 100 mg via INTRAVENOUS

## 2014-12-27 MED ORDER — LIDOCAINE HCL (CARDIAC) 20 MG/ML IV SOLN
INTRAVENOUS | Status: AC
  Filled 2014-12-27: qty 5

## 2014-12-27 MED ORDER — PROPOFOL 10 MG/ML IV BOLUS
INTRAVENOUS | Status: AC
  Filled 2014-12-27: qty 20

## 2014-12-27 MED ORDER — PHENYLEPHRINE HCL 10 MG/ML IJ SOLN
INTRAMUSCULAR | Status: DC | PRN
  Administered 2014-12-27 (×2): 80 ug via INTRAVENOUS

## 2014-12-27 MED ORDER — PHENYLEPHRINE 40 MCG/ML (10ML) SYRINGE FOR IV PUSH (FOR BLOOD PRESSURE SUPPORT)
PREFILLED_SYRINGE | INTRAVENOUS | Status: AC
  Filled 2014-12-27: qty 10

## 2014-12-27 MED ORDER — SODIUM CHLORIDE 0.9 % IV SOLN: INTRAVENOUS | Status: DC

## 2014-12-27 MED ORDER — KETAMINE HCL 10 MG/ML IJ SOLN
INTRAMUSCULAR | Status: DC | PRN
  Administered 2014-12-27: 20 mg via INTRAVENOUS

## 2014-12-27 SURGICAL SUPPLY — 21 items

## 2014-12-27 NOTE — Interval H&P Note (Signed)
History and Physical Interval Note: Pt presents for FMT for recurrent c diff The nature of the procedure, as well as the risks, benefits, and alternatives were carefully and thoroughly reviewed with the patient. Ample time for discussion and questions allowed. The patient understood, was satisfied, and agreed to proceed.     12/27/2014 11:00 AM  Yolanda Spears  has presented today for surgery, with the diagnosis of recurrent c diff  The various methods of treatment have been discussed with the patient and family. After consideration of risks, benefits and other options for treatment, the patient has consented to  Procedure(s): COLONOSCOPY WITH PROPOFOL (N/A) FECAL TRANSPLANT (N/A) as a surgical intervention .  The patient's history has been reviewed, patient examined, no change in status, stable for surgery.  I have reviewed the patient's chart and labs.  Questions were answered to the patient's satisfaction.     Kaelin Bonelli M

## 2014-12-27 NOTE — Discharge Instructions (Addendum)
YOU HAD AN ENDOSCOPIC PROCEDURE TODAY: Refer to the procedure report that was given to you for any specific questions about what was found during the examination.  If the procedure report does not answer your questions, please call your gastroenterologist to clarify. ° °YOU SHOULD EXPECT: Some feelings of bloating in the abdomen. Passage of more gas than usual.  Walking can help get rid of the air that was put into your GI tract during the procedure and reduce the bloating. If you had a lower endoscopy (such as a colonoscopy or flexible sigmoidoscopy) you may notice spotting of blood in your stool or on the toilet paper.  ° °DIET: Your first meal following the procedure should be a light meal and then it is ok to progress to your normal diet.  A half-sandwich or bowl of soup is an example of a good first meal.  Heavy or fried foods are harder to digest and may make you feel nasueas or bloated.  Drink plenty of fluids but you should avoid alcoholic beverages for 24 hours. ° °ACTIVITY: Your care partner should take you home directly after the procedure.  You should plan to take it easy, moving slowly for the rest of the day.  You can resume normal activity the day after the procedure however you should NOT DRIVE or use heavy machinery for 24 hours (because of the sedation medicines used during the test).   ° °SYMPTOMS TO REPORT IMMEDIATELY  °A gastroenterologist can be reached at any hour.  Please call your doctor's office for any of the following symptoms: ° °· Following lower endoscopy (colonoscopy, flexible sigmoidoscopy) ° Excessive amounts of blood in the stool ° Significant tenderness, worsening of abdominal pains ° Swelling of the abdomen that is new, acute ° Fever of 100° or higher °· Following upper endoscopy (EGD, EUS, ERCP) ° Vomiting of blood or coffee ground material ° New, significant abdominal pain ° New, significant chest pain or pain under the shoulder blades ° Painful or persistently difficult  swallowing ° New shortness of breath ° Black, tarry-looking stools ° °FOLLOW UP: °If any biopsies were taken you will be contacted by phone or by letter within the next 1-3 weeks.  Call your gastroenterologist if you have not heard about the biopsies in 3 weeks.  °Please also call your gastroenterologist's office with any specific questions about appointments or follow up tests. ° °Conscious Sedation, Adult, Care After °Refer to this sheet in the next few weeks. These instructions provide you with information on caring for yourself after your procedure. Your health care provider may also give you more specific instructions. Your treatment has been planned according to current medical practices, but problems sometimes occur. Call your health care provider if you have any problems or questions after your procedure. °WHAT TO EXPECT AFTER THE PROCEDURE  °After your procedure: °· You may feel sleepy, clumsy, and have poor balance for several hours. °· Vomiting may occur if you eat too soon after the procedure. °HOME CARE INSTRUCTIONS °· Do not participate in any activities where you could become injured for at least 24 hours. Do not: °¨ Drive. °¨ Swim. °¨ Ride a bicycle. °¨ Operate heavy machinery. °¨ Cook. °¨ Use power tools. °¨ Climb ladders. °¨ Work from a high place. °· Do not make important decisions or sign legal documents until you are improved. °· If you vomit, drink water, juice, or soup when you can drink without vomiting. Make sure you have little or no nausea before eating   solid foods. °· Only take over-the-counter or prescription medicines for pain, discomfort, or fever as directed by your health care provider. °· Make sure you and your family fully understand everything about the medicines given to you, including what side effects may occur. °· You should not drink alcohol, take sleeping pills, or take medicines that cause drowsiness for at least 24 hours. °· If you smoke, do not smoke without  supervision. °· If you are feeling better, you may resume normal activities 24 hours after you were sedated. °· Keep all appointments with your health care provider. °SEEK MEDICAL CARE IF: °· Your skin is pale or bluish in color. °· You continue to feel nauseous or vomit. °· Your pain is getting worse and is not helped by medicine. °· You have bleeding or swelling. °· You are still sleepy or feeling clumsy after 24 hours. °SEEK IMMEDIATE MEDICAL CARE IF: °· You develop a rash. °· You have difficulty breathing. °· You develop any type of allergic problem. °· You have a fever. °MAKE SURE YOU: °· Understand these instructions. °· Will watch your condition. °· Will get help right away if you are not doing well or get worse. °Document Released: 06/14/2013 Document Reviewed: 06/14/2013 °[employer]® Patient Information ©2015 [employer]. This information is not intended to replace advice given to you by your health care provider. Make sure you discuss any questions you have with your health care provider. ° °

## 2014-12-27 NOTE — H&P (View-Only) (Signed)
Oral vanco 125mg  QID x 14 d called into [facility]

## 2014-12-27 NOTE — Op Note (Signed)
[facility] [place], [address]   COLONOSCOPY PROCEDURE REPORT  PATIENT: Yolanda Spears, Yolanda Spears  MR#: [id number] BIRTHDATE: October 22, 1934 , 79  yrs. old GENDER: female ENDOSCOPIST: Jerene Bears, MD REFERRED [id number] Baxter Flattery, M.D. PROCEDURE DATE:  12/27/2014 PROCEDURE:   Colonoscopy, diagnostic First Screening Colonoscopy - Avg.  risk and is 50 yrs.  old or older - No.  Prior Negative Screening - Now for repeat screening. N/A  History of Adenoma - Now for follow-up colonoscopy & has been > or = to 3 yrs.  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 MEDICATIONS: Monitored anesthesia care and Per Anesthesia  DESCRIPTION OF PROCEDURE:   After the risks benefits and alternatives of the procedure were thoroughly explained, informed consent was obtained.  The digital rectal exam revealed no rectal mass.   The Pentax Ped Colon [id number]  endoscope was introduced through the anus and advanced to the terminal ileum which was intubated for a short distance. No adverse events experienced. The quality of the prep was (MoviPrep was used) good.  The instrument was then slowly withdrawn as the colon was fully examined.    COLON FINDINGS: The examined terminal ileum appeared to be normal. The colonic mucosa appeared grossly normal throughout the entire examined colon with limited views/inspection of the colon. 400 mL of donor liquid stool transplanted into the terminal ileum without complication.  There was mild diverticulosis noted in the sigmoid colon.  Retroflexed views revealed no abnormalities. The time to cecum = 3.4 Withdrawal time = 4.7   The scope was withdrawn with limited inspection and the procedure completed.  COMPLICATIONS: There were no immediate complications.  ENDOSCOPIC IMPRESSION: 1.   The examined terminal ileum appeared to be normal 2.   The colonic mucosa appeared grossly normal  throughout the entire examined colon 3.   Mild diverticulosis was noted in the sigmoid colon 4.  Successful transplant 400 mL donor stool into the terminal ileum  RECOMMENDATIONS: 1.  Observe off vancomycin 2.  Follow-up with Dr.  Baxter Flattery and with me  eSigned:  Jerene Bears, MD 12/27/2014 11:43 AM   cc: the patient, Dr. Baxter Flattery

## 2014-12-27 NOTE — Transfer of Care (Signed)
Immediate Anesthesia Transfer of Care Note  Patient: Yolanda Spears  Procedure(s) Performed: Procedure(s): COLONOSCOPY WITH PROPOFOL (N/A) FECAL TRANSPLANT (N/A)  Patient Location: PACU and Endoscopy Unit  Anesthesia Type:MAC  Level of Consciousness: awake, alert , oriented and patient cooperative  Airway & Oxygen Therapy: Patient Spontanous Breathing and Patient connected to face mask oxygen  Post-op Assessment: Report given to RN, Post -op Vital signs reviewed and stable and Patient moving all extremities X 4  Post vital signs: Reviewed and stable  Last Vitals:  Filed Vitals:   12/27/14 0959  BP: 139/41  Temp: 36.6 C  Resp: 12    Complications: No apparent anesthesia complications

## 2014-12-27 NOTE — Anesthesia Postprocedure Evaluation (Signed)
  Anesthesia Post-op Note  Patient: Yolanda Spears  Procedure(s) Performed: Procedure(s) (LRB): COLONOSCOPY WITH PROPOFOL (N/A) FECAL TRANSPLANT (N/A)  Patient Location: PACU  Anesthesia Type: MAC  Level of Consciousness: awake and alert   Airway and Oxygen Therapy: Patient Spontanous Breathing  Post-op Pain: mild  Post-op Assessment: Post-op Vital signs reviewed, Patient's Cardiovascular Status Stable, Respiratory Function Stable, Patent Airway and No signs of Nausea or vomiting  Last Vitals:  Filed Vitals:   12/27/14 1210  BP: 134/99  Pulse: 61  Temp:   Resp: 13    Post-op Vital Signs: stable   Complications: No apparent anesthesia complications

## 2014-12-28 ENCOUNTER — Encounter (HOSPITAL_COMMUNITY): Payer: Self-pay | Admitting: Internal Medicine

## 2014-12-31 ENCOUNTER — Encounter: Payer: Medicare Other | Admitting: *Deleted

## 2014-12-31 ENCOUNTER — Ambulatory Visit: Payer: Medicare Other

## 2014-12-31 ENCOUNTER — Ambulatory Visit (INDEPENDENT_AMBULATORY_CARE_PROVIDER_SITE_OTHER): Payer: Medicare Other | Admitting: *Deleted

## 2014-12-31 ENCOUNTER — Telehealth: Payer: Self-pay | Admitting: Cardiology

## 2014-12-31 DIAGNOSIS — I495 Sick sinus syndrome: Secondary | ICD-10-CM | POA: Diagnosis not present

## 2014-12-31 NOTE — Telephone Encounter (Signed)
Spoke with pt and reminded pt of remote transmission that is due today. Pt verbalized understanding.   

## 2015-01-01 ENCOUNTER — Encounter: Payer: Self-pay | Admitting: Cardiology

## 2015-01-03 ENCOUNTER — Telehealth: Payer: Self-pay | Admitting: *Deleted

## 2015-01-03 ENCOUNTER — Telehealth: Payer: Self-pay | Admitting: Internal Medicine

## 2015-01-03 NOTE — Telephone Encounter (Signed)
Patient states that urologist sent Sulfamethoxazole twice daily x 7 days for a bladder infection she was found to have this week. She wanted to make sure that she is able to take this medication post fecal transplant for recurrent C Diff. Dr Archer Asa advise.Marland KitchenMarland Kitchen

## 2015-01-03 NOTE — Telephone Encounter (Signed)
Per Dr. Hilarie Fredrickson pt should contact Dr. Storm Frisk office and see if they feel it is ok for her to take the antibiotics post fecal transplant. Also she should ask if it is ok should she take a probiotic.

## 2015-01-03 NOTE — Telephone Encounter (Signed)
Had FMT one week ago, went to see urologist for recurrent uti, complained of urinary frequency but no dysuria, flank pain or systemic signs. i asked her not to take any antibiotics until symptoms worsen, then call us. i suspect she had bactuiuria but not sold she has uti at this point. Will watch carefully.

## 2015-01-03 NOTE — Telephone Encounter (Signed)
Patient had fecal transplant 4/21.  She saw a urologist 4/20, was dx with urinary infection, is symptomatic and has been given monurol.  Patient will not take antibiotics until she gets permission from Dr. Cindie Crumbly. Yolanda Spears. Please advise. Landis Gandy, RN

## 2015-01-03 NOTE — Telephone Encounter (Signed)
Patient advised of Dr Vena Rua recommendations and she verbalizes understanding. She already has a call in to Dr Baxter Flattery.

## 2015-01-04 ENCOUNTER — Ambulatory Visit (INDEPENDENT_AMBULATORY_CARE_PROVIDER_SITE_OTHER): Payer: Medicare Other

## 2015-01-04 DIAGNOSIS — Z5181 Encounter for therapeutic drug level monitoring: Secondary | ICD-10-CM | POA: Diagnosis not present

## 2015-01-04 DIAGNOSIS — I4891 Unspecified atrial fibrillation: Secondary | ICD-10-CM

## 2015-01-04 LAB — POCT INR: INR: 2.4

## 2015-01-08 NOTE — Progress Notes (Signed)
Remote pacemaker transmission.   

## 2015-01-09 ENCOUNTER — Ambulatory Visit (INDEPENDENT_AMBULATORY_CARE_PROVIDER_SITE_OTHER): Payer: Medicare Other | Admitting: Podiatry

## 2015-01-09 ENCOUNTER — Encounter: Payer: Self-pay | Admitting: Podiatry

## 2015-01-09 DIAGNOSIS — M79676 Pain in unspecified toe(s): Secondary | ICD-10-CM | POA: Diagnosis not present

## 2015-01-09 DIAGNOSIS — B351 Tinea unguium: Secondary | ICD-10-CM | POA: Diagnosis not present

## 2015-01-09 LAB — CUP PACEART REMOTE DEVICE CHECK
Brady Statistic AP VP Percent: 75 %
Brady Statistic AS VS Percent: 1 %
Lead Channel Impedance Value: 506 Ohm
Lead Channel Impedance Value: 551 Ohm
Lead Channel Pacing Threshold Amplitude: 0.625 V
Lead Channel Pacing Threshold Pulse Width: 0.4 ms
Lead Channel Pacing Threshold Pulse Width: 0.4 ms
Lead Channel Setting Pacing Amplitude: 2 V
Lead Channel Setting Pacing Pulse Width: 0.4 ms
Lead Channel Setting Sensing Sensitivity: 2.8 mV
MDC IDC MSMT BATTERY IMPEDANCE: 180 Ohm
MDC IDC MSMT BATTERY REMAINING LONGEVITY: 113 mo
MDC IDC MSMT BATTERY VOLTAGE: 2.78 V
MDC IDC MSMT LEADCHNL RA PACING THRESHOLD AMPLITUDE: 0.5 V
MDC IDC MSMT LEADCHNL RA SENSING INTR AMPL: 0.7 mV
MDC IDC SESS DTM: 20160425142149
MDC IDC SET LEADCHNL RV PACING AMPLITUDE: 2.5 V
MDC IDC STAT BRADY AP VS PERCENT: 0 %
MDC IDC STAT BRADY AS VP PERCENT: 24 %

## 2015-01-09 NOTE — Patient Instructions (Signed)
Removed Band-Aid on left great toe and 1-2 days Apply topical antibiotic ointment to the end of left toe daily and cover with a Band-Aid until a scab forms

## 2015-01-10 ENCOUNTER — Telehealth: Payer: Self-pay | Admitting: Internal Medicine

## 2015-01-10 ENCOUNTER — Ambulatory Visit (INDEPENDENT_AMBULATORY_CARE_PROVIDER_SITE_OTHER): Payer: Medicare Other | Admitting: Internal Medicine

## 2015-01-10 VITALS — BP 137/78 | HR 78 | Temp 97.6°F | Wt 178.0 lb

## 2015-01-10 DIAGNOSIS — A0471 Enterocolitis due to Clostridium difficile, recurrent: Secondary | ICD-10-CM

## 2015-01-10 DIAGNOSIS — R197 Diarrhea, unspecified: Secondary | ICD-10-CM

## 2015-01-10 DIAGNOSIS — R350 Frequency of micturition: Secondary | ICD-10-CM

## 2015-01-10 DIAGNOSIS — A047 Enterocolitis due to Clostridium difficile: Secondary | ICD-10-CM

## 2015-01-10 NOTE — Telephone Encounter (Signed)
Spoke w/pt--transmission received. Pt aware and appointment made for August.

## 2015-01-10 NOTE — Progress Notes (Signed)
Patient ID: Yolanda Spears, female   DOB: 03/25/1935, 80 y.o.   MRN: [id number]  Subjective: This patient presents today complaining of painful toenails when wearing closed shoes  Objective: The toenails are brittle, elongated, incurvated, discolored and tender to direct palpation 6-10  Assessment: Symptomatic onychomycoses 6-10  Plan: Debridement toenails 10  Small pinpoint bleeding left hallux treated with antibiotic ointment and Band-Aid Patient advised to continue applying topical antibiotic ointment Band-Aid left hallux toenail daily until a scab forms  Reappoint 3 months

## 2015-01-10 NOTE — Telephone Encounter (Signed)
New Message   Pt wanting to speak w/ Device- about remote transmission. Left message yesterday and had not heard anything. Please call backa nd discuss.

## 2015-01-10 NOTE — Progress Notes (Signed)
Subjective:    Patient ID: Yolanda Spears, female    DOB: [birth year], 80 y.o.   MRN: [id number]  HPI [name] is a 80yo Female with a past medical history of transient small bowel obstructions secondary to adhesive abdominal disease status post recent open LOA and small bowel/ileal resection, 18 inch of SB resected by Dr. Johney Maine in 12/28/2013. She also has stress incontinence and  hx of recurrent UTI.  She initially had watery stools after her SBP and small bowel resection. She was told that her bowel movements would take awhile to return back to normal. In late summer, she was diagnosed with c.difficile colitis with numerous relapses requiring vanco taper.She underwent fecal transplant on 12/27/2014. Since her transplant, she reports having 1 formed stool in the morning (which is an improvement) but still has watery/diarrheal stools 2 x per day. She sees dr. Sloan Leiter on 5/11  She had seen urology last week for urinary frequency, with Dr. Vikki Ports who did a urine cx that isolated a sensitive klebsiella. She was given rx of fosfomycin though patient did not fill it per my instruction. She denies any dysuria, or other systemic symptoms. i think this is asymptomatic bacturia. She feels some vaginal itchiness. She sees urology on 5/17 for ultrasound  Current Outpatient Prescriptions on File Prior to Visit  Medication Sig Dispense Refill  . calcium carbonate (TUMS - DOSED IN MG ELEMENTAL CALCIUM) 500 MG chewable tablet Chew 1 tablet by mouth daily.    . Cholecalciferol (VITAMIN D-3) 5000 UNITS TABS Take 1 tablet by mouth 4 (four) times a week. Takes on Saturday, Sunday, Tuesday and Thursday    . furosemide (LASIX) 40 MG tablet TAKE 1 TABLET BY MOUTH EVERY DAY 90 tablet 1  . hydrALAZINE (APRESOLINE) 25 MG tablet TAKE 1 TABLET BY MOUTH THREE TIMES A DAY 270 tablet 1  . lisinopril (PRINIVIL,ZESTRIL) 10 MG tablet Take 1 tablet (10 mg total) by mouth 2 (two) times daily. 180 tablet 3  . metoprolol  succinate (TOPROL-XL) 100 MG 24 hr tablet TAKE 1 TABLET BY MOUTH EVERY DAY WITH OR IMMEDIATELY FOLLOWING A MEAL 90 tablet 1  . MOVIPREP 100 G SOLR Take 1 kit (200 g total) by mouth once. 1 kit 0  . potassium chloride SA (K-DUR,KLOR-CON) 10 MEQ tablet Take 1 tablet (10 mEq total) by mouth daily. 30 tablet 11  . pregabalin (LYRICA) 75 MG capsule Take 75 mg by mouth 2 (two) times daily.    . propafenone (RYTHMOL) 225 MG tablet TAKE 1 TABLET (225 MG TOTAL) BY MOUTH EVERY 8 (EIGHT) HOURS. 270 tablet 1  . warfarin (COUMADIN) 2.5 MG tablet TAKE 1 TABLET BY MOUTH EVERY DAY OR AS DIRECTED 30 tablet 3  . ZETIA 10 MG tablet TAKE 1/2 TABLET BY MOUTH EVERY DAY 30 tablet 1   No current facility-administered medications on file prior to visit.   Active Ambulatory Problems    Diagnosis Date Noted  . Atrial fibrillation 11/24/2010  . First degree AV block 11/26/2010  . Pacemaker- Dual-chamber-mdt 11/26/2010  . Tachycardia-bradycardia syndrome 11/26/2010  . Benign hypertensive heart disease without heart failure 01/09/2011  . TIA (transient ischemic attack) 04/28/2011  . Vertigo 08/10/2011  . Hypercholesterolemia 08/10/2011  . Partial small bowel obstruction - recurrent - s/p lysis of adhesions 12/28/2013 09/14/2011  . Orthostatic lightheadedness 11/27/2011  . History of adenomatous polyp of colon 04/07/2012  . Sleep apnea 03/29/2013  . Encounter for therapeutic drug monitoring 10/05/2013  . Thickened small bowel - radiation  enteritis/scarring s/p ileal resection 12/28/2013 10/20/2013  . Internal hemorrhoids with irritation 01/04/2014  . Diarrhea 03/30/2014  . Paroxysmal atrial fibrillation 03/30/2014  . Chronic diarrhea 04/04/2014  . Clostridium difficile infection 06/01/2014  . Recurrent Clostridium difficile diarrhea    Resolved Ambulatory Problems    Diagnosis Date Noted  . SBO (small bowel obstruction) 09/11/2013  . Small bowel obstruction due to adhesions 12/28/2013   Past Medical History    Diagnosis Date  . Hypertension   . Peripheral neuropathy   . High cholesterol   . Heart murmur   . Shortness of breath on exertion   . History of radiation therapy   . Partial bowel obstruction JAN/2013  . Colon polyp 2011  . Osteopenia 02/2013  . Stroke   . Arthritis   . Personal history of skin cancer   . Urinary leakage   . Hx: UTI (urinary tract infection)   . Endometrial cancer 2006  . Breast cancer 1999  . Ringing in ears   . Hx of echocardiogram   . Tubular adenoma of colon   . Internal hemorrhoids       Review of Systems 10 point ros negative except for loose stools, weight loss of #30 over last 12 months. Easily fatigued. No fevers or nightsweats. occ has chills.    Objective:   Physical Exam  Did not examine     Assessment & Plan:  Recurrent c.difficile colitis  S/p 2 wks from fecal transplant = it appears it may have worked since she has less diarrhea stools ( 6+ down to 2) and one formed stools usually in the morning. i suspect it is taking a slower amount of time for "good gut bacteria" to re-establish her microbiota. Her hx of having small bowel resection may predispose her to having diarhrea stools for sometime, possibly life long.  Loose stools =Will ask her to do trial of immodium to see if it gives her relief in the afternoon  Will give her a stool kit to use in the future if she has wtaery stools greater than 4 +/day. For now do not think we need to test  Spoke at length to avoid any unnecessary antibiotics for as long as possible. Her frequent urination can easily be mistaken for uti. i think i would pull trigger to treat when she has systemic symptoms, or use very narrow, short course when needed. i agree that at this time she does not need abtx  For her urinary symptoms. i discussed with her doing trials of non-abtx treatment such as intravaginal hormone to see if that could help with dryness sensation. Will defer to Dr. Vikki Ports to see if it worth a  try.  rtc in 6 wk for 60day follow up post FMT

## 2015-01-12 ENCOUNTER — Other Ambulatory Visit: Payer: Self-pay | Admitting: Cardiology

## 2015-01-17 ENCOUNTER — Telehealth: Payer: Self-pay | Admitting: *Deleted

## 2015-01-17 NOTE — Telephone Encounter (Signed)
Patient told she could take "a pill for a yeast infection" by her urologist if ok'd by [facility] (Fluconazone 100 mg once daily for three days per pharmacy).  Is this ok for her to take?  (FYI - patient did NOT take the monurol previously prescribed by urology). Please advise. Landis Gandy, RN

## 2015-01-18 ENCOUNTER — Encounter: Payer: Self-pay | Admitting: Cardiology

## 2015-01-18 ENCOUNTER — Ambulatory Visit (INDEPENDENT_AMBULATORY_CARE_PROVIDER_SITE_OTHER): Payer: Medicare Other | Admitting: *Deleted

## 2015-01-18 ENCOUNTER — Ambulatory Visit (INDEPENDENT_AMBULATORY_CARE_PROVIDER_SITE_OTHER): Payer: Medicare Other | Admitting: Cardiology

## 2015-01-18 VITALS — BP 124/48 | HR 64 | Ht 67.0 in | Wt 168.8 lb

## 2015-01-18 DIAGNOSIS — I480 Paroxysmal atrial fibrillation: Secondary | ICD-10-CM | POA: Diagnosis not present

## 2015-01-18 DIAGNOSIS — Z5181 Encounter for therapeutic drug level monitoring: Secondary | ICD-10-CM

## 2015-01-18 DIAGNOSIS — I119 Hypertensive heart disease without heart failure: Secondary | ICD-10-CM | POA: Diagnosis not present

## 2015-01-18 DIAGNOSIS — I5033 Acute on chronic diastolic (congestive) heart failure: Secondary | ICD-10-CM | POA: Diagnosis not present

## 2015-01-18 DIAGNOSIS — B9689 Other specified bacterial agents as the cause of diseases classified elsewhere: Secondary | ICD-10-CM

## 2015-01-18 DIAGNOSIS — A498 Other bacterial infections of unspecified site: Secondary | ICD-10-CM

## 2015-01-18 DIAGNOSIS — I4891 Unspecified atrial fibrillation: Secondary | ICD-10-CM | POA: Diagnosis not present

## 2015-01-18 LAB — POCT INR: INR: 1.6

## 2015-01-18 NOTE — Patient Instructions (Addendum)
Medication Instructions:  Your physician recommends that you continue on your current medications as directed. Please refer to the Current Medication list given to you today.  Labwork: NONE  Testing/Procedures: NONE  Follow-Up: Your physician recommends that you schedule a follow-up appointment in: 4 months OV/EKG

## 2015-01-18 NOTE — Progress Notes (Signed)
Cardiology Office Note   Date:  01/18/2015   ID:  Yolanda Spears, DOB 1934/09/25, MRN [id number]  PCP:  Thressa Sheller, MD  Cardiologist: Darlin Coco MD  No chief complaint on file.     History of Present Illness: Yolanda Spears is a 80 y.o. female who presents for follow-up office visit  This pleasant 80 year old woman is seen for a scheduled followup office visit. The patient has a past history of tachybradycardia syndrome and had a pacemaker implanted in 2007. She had a pacemaker generator change by Dr. Caryl Comes on 03/23/12. She is on chronic Coumadin anticoagulation. She has a past history of paroxysmal atrial fibrillation and is on generic Rythmol.. The patient has not been having any lightheadedness or syncope. She has not been aware of any racing of her heart. No palpitations. He does have chronic positional vertigo when she changes positions or rolls over in bed. She has a history of peripheral neuropathy and sees Dr. Krista Blue who has her on Lyrica. She sleeps poorly. She has been told that she has sleep apnea. She tried a CPAP machine years ago but was unable to tolerate it.   The patient underwent abdominal surgery in April 2015 for recurrent small bowel obstruction. Following surgery she went to a nursing facility. While there she went into congestive heart failure with severe dyspnea hypoxemia and peripheral edema. Lasix was adjusted and she improved over the next several weeks. She feels now that she is back to baseline in terms of her breathing. She is having chronic diarrhea. She failed to clear her C. difficile infection with vancomycin.  More recently she had a fecal transplant .she is being followed by infectious disease.  Dr. Hilarie Fredrickson is her gastroenterologist.  Currently she is having about 4 loose stools a day.  Her weight is down 10 pounds since last visit.   On the cardiac standpoint she has not had any recurrence of her congestive heart failure.  No angina  pectoris.  She will sometimes have chest pain on deep inspiration.   Past Medical History  Diagnosis Date  . Pacemaker -[employer]   . Hypertension   . Peripheral neuropathy   . High cholesterol   . Heart murmur   . Shortness of breath on exertion   . Atrial fibrillation   . History of radiation therapy   . Partial bowel obstruction JAN/2013    recurrent  . Colon polyp 2011  . Osteopenia 02/2013    T score -1.4 FRAX 11%/2.1%  . Stroke     "small stroke & several TIA's occasional visual problems / and occasional speach problems  . Arthritis   . Personal history of skin cancer   . Urinary leakage   . Hx: UTI (urinary tract infection)   . Sleep apnea     unaable to tolerate mask   . Endometrial cancer 2006    Stage IIIc  s/p radiation  . Breast cancer 1999    LOBULAR CARCINOMA IN SITU...  . Ringing in ears   . Orthostatic lightheadedness 11/27/2011  . Small bowel obstruction due to adhesions 12/28/2013  . Hx of echocardiogram     Echo (02/2014):  EF 55-60%, no RWMA, mild AI, mild MR, mild LAE, PASP 44 mmHg  . Tubular adenoma of colon   . Internal hemorrhoids   . Recurrent Clostridium difficile diarrhea     Past Surgical History  Procedure Laterality Date  . Lymph node resection  2007  . Cholecystectomy  1990's  .  Mastectomy  1999    bilaterally w/lymph node dissection  . Abdominal hysterectomy  2006    BSO  . Oophorectomy      BSO  . Exploratory laparotomy  2007    remove abdominal cyst  . Insert / replace / remove pacemaker  2007    initial placement  2007 / REPLACED 2014     . Laparoscopic lysis intestinal adhesions  12/28/2013  . Abdominal adhesion surgery  12/28/2013  . Small intestine surgery  12/28/2013  . Laparoscopic lysis of adhesions N/A 12/28/2013    Procedure: LAPAROSCOPIC LYSIS OF ADHESIONS converted OPEN SMALL BOWEL;  Surgeon: Adin Hector, MD;  Location: [facility];  Service: General;  Laterality: N/A;  . Permanent pacemaker generator change N/A 03/23/2012      Procedure: PERMANENT PACEMAKER GENERATOR CHANGE;  Surgeon: Deboraha Sprang, MD;  Location: [facility] CATH LAB;  Service: Cardiovascular;  Laterality: N/A;  . Colonoscopy with propofol N/A 12/27/2014    Procedure: COLONOSCOPY WITH PROPOFOL;  Surgeon: Jerene Bears, MD;  Location: [facility];  Service: Gastroenterology;  Laterality: N/A;  . Fecal transplant N/A 12/27/2014    Procedure: FECAL TRANSPLANT;  Surgeon: Jerene Bears, MD;  Location: [facility];  Service: Gastroenterology;  Laterality: N/A;     Current Outpatient Prescriptions  Medication Sig Dispense Refill  . calcium carbonate (TUMS - DOSED IN MG ELEMENTAL CALCIUM) 500 MG chewable tablet Chew 1 tablet by mouth daily.    . Cholecalciferol (VITAMIN D-3) 5000 UNITS TABS Take 1 tablet by mouth 4 (four) times a week. Takes on Saturday, Sunday, Tuesday and Thursday    . furosemide (LASIX) 40 MG tablet TAKE 1 TABLET BY MOUTH EVERY DAY 90 tablet 1  . hydrALAZINE (APRESOLINE) 25 MG tablet TAKE 1 TABLET BY MOUTH THREE TIMES A DAY 270 tablet 1  . lisinopril (PRINIVIL,ZESTRIL) 10 MG tablet Take 1 tablet (10 mg total) by mouth 2 (two) times daily. 180 tablet 3  . metoprolol succinate (TOPROL-XL) 100 MG 24 hr tablet TAKE 1 TABLET BY MOUTH EVERY DAY WITH OR IMMEDIATELY FOLLOWING A MEAL 90 tablet 1  . potassium chloride SA (K-DUR,KLOR-CON) 10 MEQ tablet Take 1 tablet (10 mEq total) by mouth daily. 30 tablet 11  . pregabalin (LYRICA) 75 MG capsule Take 75 mg by mouth 2 (two) times daily.    . propafenone (RYTHMOL) 225 MG tablet TAKE 1 TABLET (225 MG TOTAL) BY MOUTH EVERY 8 (EIGHT) HOURS. 270 tablet 1  . warfarin (COUMADIN) 2.5 MG tablet TAKE 1 TABLET BY MOUTH EVERY DAY OR AS DIRECTED 30 tablet 3  . ZETIA 10 MG tablet TAKE 1/2 TABLET BY MOUTH EVERY DAY 15 tablet 0   No current facility-administered medications for this visit.    Allergies:   Other; Penicillins; Shellfish-derived products; Statins; and Iohexol    Social History:  The patient  reports  that she quit smoking about 16 years ago. Her smoking use included Cigarettes. She has a 40 pack-year smoking history. She has never used smokeless tobacco. She reports that she does not drink alcohol or use illicit drugs.   Family History:  The patient's family history includes Breast cancer (age of onset: 74) in her mother; Cancer in her father; Cancer (age of onset: 25) in her mother; Hypertension in her father and mother; Uterine cancer in her mother. There is no history of Colon cancer, Esophageal cancer, Rectal cancer, or Stomach cancer.    ROS:  Please see the history of present illness.   Otherwise,  review of systems are positive for none.   All other systems are reviewed and negative.    PHYSICAL EXAM: VS:  BP 124/48 mmHg  Pulse 64  Ht 5\' 7"  (1.702 m)  Wt 168 lb 12.8 oz (76.567 kg)  BMI 26.43 kg/m2 , BMI Body mass index is 26.43 kg/(m^2). GEN: Well nourished, well developed, in no acute distress HEENT: normal Neck: no JVD, carotid bruits, or masses Cardiac: RRR; no murmurs, rubs, or gallops,no edema  Respiratory:  clear to auscultation bilaterally, normal work of breathing GI: soft, nontender, nondistended, + BS MS: no deformity or atrophy Skin: warm and dry, no rash Neuro:  Strength and sensation are intact Psych: euthymic mood, full affect   EKG:  EKG is not ordered today.   Recent Labs: 01/31/2014: Pro B Natriuretic peptide (BNP) 357.0* 05/07/2014: ALT 49*; BUN 14; Creatinine 1.3*; Platelets 211.0 12/27/2014: Hemoglobin 11.9*; Potassium 3.6; Sodium 141    Lipid Panel    Component Value Date/Time   CHOL 188 03/26/2011 1126   TRIG 135.0 03/26/2011 1126   HDL 43.80 03/26/2011 1126   CHOLHDL 4 03/26/2011 1126   VLDL 27.0 03/26/2011 1126   LDLCALC 117* 03/26/2011 1126      Wt Readings from Last 3 Encounters:  01/18/15 168 lb 12.8 oz (76.567 kg)  01/10/15 178 lb (80.74 kg)  12/27/14 178 lb (80.74 kg)        ASSESSMENT AND PLAN:  1. paroxysmal atrial  fibrillation.Clinically today it appears that she  is in normal sinus rhythm. She is on long-term warfarin. Her Chadds Vasc score is 7 2. tachybradycardia syndrome status post pacemaker 3. prior TIA 4. obstructive sleep apnea. She does not use a CPAP machine 5. recent surgery for recurrent small bowel obstruction in April 2015 6. chronic diastolic heart failure. Echocardiogram 02/05/14 showed ejection fraction 55-60% with mild aortic insufficiency and mild mitral regurgitation 7. diarrhea, chronic. Positive C. Difficile. Recent fecal transplant.  Followed by infectious disease and by GI  Disposition: Continue current medication except she can stop taking the iron tablets now. Recheck here in 4 months for follow-up office visi and EKG  Current medicines are reviewed at length with the patient today.  The patient does not have concerns regarding medicines.  The following changes have been made:  no change  Labs/ tests ordered today include:  No orders of the defined types were placed in this encounter.    Berna Spare MD 01/18/2015 1:26 PM    [facility] [address], [place], [place]  [address] Phone: [phone]; Fax: [phone]

## 2015-01-18 NOTE — Telephone Encounter (Signed)
Patient called back today as she did not get a return call yesterday. Advised her will resend the message and call her back once I get a call back.

## 2015-01-18 NOTE — Telephone Encounter (Signed)
i will call her and let her know that fluconazole is ok

## 2015-01-21 ENCOUNTER — Other Ambulatory Visit: Payer: Medicare Other

## 2015-01-21 ENCOUNTER — Encounter: Payer: Self-pay | Admitting: Internal Medicine

## 2015-01-21 ENCOUNTER — Ambulatory Visit (INDEPENDENT_AMBULATORY_CARE_PROVIDER_SITE_OTHER): Payer: Medicare Other | Admitting: Internal Medicine

## 2015-01-21 VITALS — BP 136/56 | HR 72 | Ht 66.0 in | Wt 165.0 lb

## 2015-01-21 DIAGNOSIS — Z8619 Personal history of other infectious and parasitic diseases: Secondary | ICD-10-CM

## 2015-01-21 DIAGNOSIS — K529 Noninfective gastroenteritis and colitis, unspecified: Secondary | ICD-10-CM

## 2015-01-21 DIAGNOSIS — R197 Diarrhea, unspecified: Secondary | ICD-10-CM

## 2015-01-21 NOTE — Patient Instructions (Signed)
Please submit your C Diff kit (given to you by Dr Baxter Flattery).  You may take imodium as needed for socialization.  Please follow a 4-6 week gluten free challenge (information given to you today)  Your physician has requested that you go to the basement for the following lab work before leaving today: Celiac 10 panel  Please follow up with Dr Hilarie Fredrickson on Monday, 03/25/15 @ 2:45 pm.

## 2015-01-21 NOTE — Progress Notes (Signed)
Subjective:    Patient ID: Yolanda Spears, female    DOB: Mar 03, 1935, 80 y.o.   MRN: [id number]  HPI Yolanda Spears is a 80 yo female with PMH of recurrent C. difficile status post FMT on 12/27/2014, history of bowel obstructions with adhesive disease status post lysis of adhesions with small bowel ileal resection, history of breast cancer status post double mastectomy, endometrial cancer status post hysterectomy, A. fib on warfarin, hypertension and recurrent UTI who seen in follow-up. She is here alone today. She underwent fecal transplant for recurrent C. difficile nearly 4 weeks ago. When she was seen by Dr. Baxter Flattery 2 weeks ago she was still having loose stools but decreased frequency. She was previous to having 5-6 bowel movements a day at that time was down to 2 bowel movements a day. She does feel overall better but she is still having episodes of loose stool and even had a diarrhea event in the waiting room here today. Stool seen to be more formed and somewhat less urgent but is still having a great deal of unpredictability. Yesterday she had 3 bowel movements. She is still having gas and bloating. She used Imodium on Friday in order to go see cardiology and this seemed to work well on that day. Appetite still remains not normal but she is eating on a regular basis. No blood in her stool or melena. She does continue on warfarin.  Dr. Baxter Flattery gave her a C. difficile stool kit but told her only 2 submitted if the diarrhea returned. She has not yet done so  Review of Systems As per history of present illness, otherwise negative  Current Medications, Allergies, Past Medical History, Past Surgical History, Family History and Social History were reviewed in [employer] record.     Objective:   Physical Exam BP 136/56 mmHg  Pulse 72  Ht 5' 6"  (1.676 m)  Wt 165 lb (74.844 kg)  BMI 26.64 kg/m2 Constitutional: Well-developed and well-nourished. No distress. HEENT:  Normocephalic and atraumatic. Oropharynx is clear and moist. No oropharyngeal exudate. Conjunctivae are normal.  No scleral icterus. Neck: Neck supple. Trachea midline. Cardiovascular: Normal rate, regular rhythm and intact distal pulses.  Pulmonary/chest: Effort normal and breath sounds normal. No wheezing, rales or rhonchi. Abdominal: Soft, nontender, nondistended. Bowel sounds active throughout.  Extremities: no clubbing, cyanosis, or edema Neurological: Alert and oriented to person place and time. Skin: Skin is warm and dry. No rashes noted. Psychiatric: Normal mood and affect. Behavior is normal.  CBC    Component Value Date/Time   WBC 3.9* 05/07/2014 1717   RBC 4.34 05/07/2014 1717   HGB 11.9* 12/27/2014 1011   HCT 35.0* 12/27/2014 1011   PLT 211.0 05/07/2014 1717   MCV 88.6 05/07/2014 1717   MCH 28.3 01/18/2014 1534   MCHC 32.8 05/07/2014 1717   RDW 15.5 05/07/2014 1717   LYMPHSABS 1.1 05/07/2014 1717   MONOABS 0.6 05/07/2014 1717   EOSABS 0.0 05/07/2014 1717   BASOSABS 0.0 05/07/2014 1717   Celiac panel reviewed  Colonoscopy reviewed, April 2016 EGD Nov 2015 reviewed, small bowel bx normal    Assessment & Plan:  80 yo female with PMH of recurrent C. difficile status post FMT on 12/27/2014, history of bowel obstructions with adhesive disease status post lysis of adhesions with small bowel ileal resection, history of breast cancer status post double mastectomy, endometrial cancer status post hysterectomy, A. fib on warfarin, hypertension and recurrent UTI who seen in follow-up.  1. Diarrhea/history  of C. difficile status post FMT -- to me it seems that her C. difficile has been adequately treated and she has had a favorable response to fecal transplantation. She continues to have erratic bowel movements and loose stools which may be related to issues other than C. difficile infection. For confirmation she will submit the C. difficile test already ordered by ID. If this is  negative, I have recommended a gluten-free trial for 4-6 weeks. We discussed this diet at length today. In the past she had an antigliadin antibody positive and an equivocal tissue transglutaminase antibody, though small bowel biopsies were negative. Given antibody positivity I'm very curious if gluten avoidance will improve diarrhea. If there is no benefit with this medication after 6 weeks, then we can try other therapies such as Lotronex or Viberzi.  She can use Imodium per box instruction when she has to leave the house for social activities or appointments. Office follow-up in 6-8 weeks., Sooner if necessary  2. UTI -- following with urology, she has been avoiding antibiotics when possible, which I agree with.

## 2015-01-22 LAB — CELIAC PANEL 10
Endomysial Screen: NEGATIVE
GLIADIN IGG: 29 U — AB (ref <20)
Gliadin IgA: 12 Units (ref <20)
IGA: 547 mg/dL — AB (ref 69–380)
TISSUE TRANSGLUTAMINASE AB, IGA: 1 U/mL (ref <4)
Tissue Transglut Ab: 23 U/mL — ABNORMAL HIGH (ref <6)

## 2015-01-23 ENCOUNTER — Other Ambulatory Visit: Payer: Medicare Other

## 2015-01-23 DIAGNOSIS — R197 Diarrhea, unspecified: Secondary | ICD-10-CM

## 2015-01-23 DIAGNOSIS — Z8619 Personal history of other infectious and parasitic diseases: Secondary | ICD-10-CM

## 2015-01-24 LAB — CLOSTRIDIUM DIFFICILE BY PCR: CDIFFPCR: NOT DETECTED

## 2015-01-25 ENCOUNTER — Encounter: Payer: Self-pay | Admitting: Cardiology

## 2015-01-27 LAB — STOOL CULTURE

## 2015-01-30 ENCOUNTER — Encounter: Payer: Self-pay | Admitting: Internal Medicine

## 2015-01-31 ENCOUNTER — Ambulatory Visit (INDEPENDENT_AMBULATORY_CARE_PROVIDER_SITE_OTHER): Payer: Medicare Other

## 2015-01-31 DIAGNOSIS — I4891 Unspecified atrial fibrillation: Secondary | ICD-10-CM | POA: Diagnosis not present

## 2015-01-31 DIAGNOSIS — Z5181 Encounter for therapeutic drug level monitoring: Secondary | ICD-10-CM

## 2015-01-31 LAB — POCT INR: INR: 2.3

## 2015-02-11 ENCOUNTER — Other Ambulatory Visit: Payer: Self-pay | Admitting: Cardiology

## 2015-02-21 ENCOUNTER — Ambulatory Visit (INDEPENDENT_AMBULATORY_CARE_PROVIDER_SITE_OTHER): Payer: Medicare Other

## 2015-02-21 DIAGNOSIS — Z5181 Encounter for therapeutic drug level monitoring: Secondary | ICD-10-CM

## 2015-02-21 DIAGNOSIS — I4891 Unspecified atrial fibrillation: Secondary | ICD-10-CM

## 2015-02-21 LAB — POCT INR: INR: 1.8

## 2015-02-22 ENCOUNTER — Telehealth: Payer: Self-pay | Admitting: *Deleted

## 2015-02-22 ENCOUNTER — Telehealth: Payer: Self-pay | Admitting: Internal Medicine

## 2015-02-22 NOTE — Telephone Encounter (Signed)
Spoke with patient and she is calling to let Dr. Hilarie Fredrickson know that the gluten free diet did not help the diarrhea at all.  She is scheduled for an OV on 03/25/15. She is asking what is next.

## 2015-02-22 NOTE — Telephone Encounter (Signed)
Patient called to ask Dr. Baxter Flattery if she should keep her appt on 6.21.16. She states there has been no change and unsure if it is necessary to be seen.

## 2015-02-25 ENCOUNTER — Encounter (HOSPITAL_COMMUNITY): Payer: Self-pay | Admitting: Emergency Medicine

## 2015-02-25 ENCOUNTER — Emergency Department (HOSPITAL_COMMUNITY): Payer: Medicare Other

## 2015-02-25 ENCOUNTER — Inpatient Hospital Stay (HOSPITAL_COMMUNITY)
Admission: EM | Admit: 2015-02-25 | Discharge: 2015-02-27 | DRG: 069 | Disposition: A | Discharge status: Home Health | Payer: Medicare Other | Attending: Internal Medicine | Admitting: Internal Medicine

## 2015-02-25 DIAGNOSIS — I4891 Unspecified atrial fibrillation: Secondary | ICD-10-CM | POA: Diagnosis not present

## 2015-02-25 DIAGNOSIS — Z950 Presence of cardiac pacemaker: Secondary | ICD-10-CM | POA: Diagnosis not present

## 2015-02-25 DIAGNOSIS — G459 Transient cerebral ischemic attack, unspecified: Secondary | ICD-10-CM | POA: Diagnosis not present

## 2015-02-25 DIAGNOSIS — Z87891 Personal history of nicotine dependence: Secondary | ICD-10-CM

## 2015-02-25 DIAGNOSIS — R200 Anesthesia of skin: Secondary | ICD-10-CM | POA: Diagnosis not present

## 2015-02-25 DIAGNOSIS — M25511 Pain in right shoulder: Secondary | ICD-10-CM | POA: Diagnosis not present

## 2015-02-25 DIAGNOSIS — M8580 Other specified disorders of bone density and structure, unspecified site: Secondary | ICD-10-CM | POA: Diagnosis present

## 2015-02-25 DIAGNOSIS — Z85828 Personal history of other malignant neoplasm of skin: Secondary | ICD-10-CM

## 2015-02-25 DIAGNOSIS — Z79899 Other long term (current) drug therapy: Secondary | ICD-10-CM | POA: Diagnosis not present

## 2015-02-25 DIAGNOSIS — G4730 Sleep apnea, unspecified: Secondary | ICD-10-CM | POA: Diagnosis present

## 2015-02-25 DIAGNOSIS — Z853 Personal history of malignant neoplasm of breast: Secondary | ICD-10-CM

## 2015-02-25 DIAGNOSIS — G451 Carotid artery syndrome (hemispheric): Secondary | ICD-10-CM

## 2015-02-25 DIAGNOSIS — N183 Chronic kidney disease, stage 3 (moderate): Secondary | ICD-10-CM | POA: Diagnosis present

## 2015-02-25 DIAGNOSIS — R531 Weakness: Secondary | ICD-10-CM

## 2015-02-25 DIAGNOSIS — I5032 Chronic diastolic (congestive) heart failure: Secondary | ICD-10-CM | POA: Diagnosis present

## 2015-02-25 DIAGNOSIS — I129 Hypertensive chronic kidney disease with stage 1 through stage 4 chronic kidney disease, or unspecified chronic kidney disease: Secondary | ICD-10-CM | POA: Diagnosis present

## 2015-02-25 DIAGNOSIS — G629 Polyneuropathy, unspecified: Secondary | ICD-10-CM | POA: Diagnosis present

## 2015-02-25 DIAGNOSIS — G458 Other transient cerebral ischemic attacks and related syndromes: Secondary | ICD-10-CM | POA: Diagnosis not present

## 2015-02-25 DIAGNOSIS — Z923 Personal history of irradiation: Secondary | ICD-10-CM | POA: Diagnosis not present

## 2015-02-25 DIAGNOSIS — K529 Noninfective gastroenteritis and colitis, unspecified: Secondary | ICD-10-CM | POA: Diagnosis present

## 2015-02-25 DIAGNOSIS — Z7901 Long term (current) use of anticoagulants: Secondary | ICD-10-CM | POA: Diagnosis not present

## 2015-02-25 DIAGNOSIS — Z8673 Personal history of transient ischemic attack (TIA), and cerebral infarction without residual deficits: Secondary | ICD-10-CM | POA: Diagnosis not present

## 2015-02-25 DIAGNOSIS — I482 Chronic atrial fibrillation: Secondary | ICD-10-CM | POA: Diagnosis present

## 2015-02-25 DIAGNOSIS — E785 Hyperlipidemia, unspecified: Secondary | ICD-10-CM | POA: Diagnosis not present

## 2015-02-25 DIAGNOSIS — Z8542 Personal history of malignant neoplasm of other parts of uterus: Secondary | ICD-10-CM | POA: Diagnosis not present

## 2015-02-25 DIAGNOSIS — K56600 Partial intestinal obstruction, unspecified as to cause: Secondary | ICD-10-CM | POA: Diagnosis present

## 2015-02-25 LAB — CBC
HCT: 31.9 % — ABNORMAL LOW (ref 36.0–46.0)
Hemoglobin: 10.1 g/dL — ABNORMAL LOW (ref 12.0–15.0)
MCH: 28.7 pg (ref 26.0–34.0)
MCHC: 31.7 g/dL (ref 30.0–36.0)
MCV: 90.6 fL (ref 78.0–100.0)
PLATELETS: 198 10*3/uL (ref 150–400)
RBC: 3.52 MIL/uL — AB (ref 3.87–5.11)
RDW: 14 % (ref 11.5–15.5)
WBC: 7 10*3/uL (ref 4.0–10.5)

## 2015-02-25 LAB — COMPREHENSIVE METABOLIC PANEL
ALK PHOS: 91 U/L (ref 38–126)
ALT: 17 U/L (ref 14–54)
AST: 23 U/L (ref 15–41)
Albumin: 3.1 g/dL — ABNORMAL LOW (ref 3.5–5.0)
Anion gap: 12 (ref 5–15)
BUN: 26 mg/dL — AB (ref 6–20)
CO2: 22 mmol/L (ref 22–32)
Calcium: 8.6 mg/dL — ABNORMAL LOW (ref 8.9–10.3)
Chloride: 107 mmol/L (ref 101–111)
Creatinine, Ser: 1.4 mg/dL — ABNORMAL HIGH (ref 0.44–1.00)
GFR calc non Af Amer: 35 mL/min — ABNORMAL LOW (ref >60)
GFR, EST AFRICAN AMERICAN: 41 mL/min — AB (ref >60)
GLUCOSE: 96 mg/dL (ref 65–99)
POTASSIUM: 3.4 mmol/L — AB (ref 3.5–5.1)
Sodium: 141 mmol/L (ref 135–145)
TOTAL PROTEIN: 7 g/dL (ref 6.5–8.1)
Total Bilirubin: 0.8 mg/dL (ref 0.3–1.2)

## 2015-02-25 LAB — RAPID URINE DRUG SCREEN, HOSP PERFORMED
AMPHETAMINES: NOT DETECTED
BENZODIAZEPINES: NOT DETECTED
Barbiturates: NOT DETECTED
COCAINE: NOT DETECTED
Opiates: NOT DETECTED
Tetrahydrocannabinol: NOT DETECTED

## 2015-02-25 LAB — I-STAT CHEM 8, ED
BUN: 31 mg/dL — ABNORMAL HIGH (ref 6–20)
Calcium, Ion: 1.13 mmol/L (ref 1.13–1.30)
Chloride: 105 mmol/L (ref 101–111)
Creatinine, Ser: 1.5 mg/dL — ABNORMAL HIGH (ref 0.44–1.00)
Glucose, Bld: 97 mg/dL (ref 65–99)
HEMATOCRIT: 32 % — AB (ref 36.0–46.0)
Hemoglobin: 10.9 g/dL — ABNORMAL LOW (ref 12.0–15.0)
Potassium: 3.5 mmol/L (ref 3.5–5.1)
Sodium: 141 mmol/L (ref 135–145)
TCO2: 24 mmol/L (ref 0–100)

## 2015-02-25 LAB — URINALYSIS, ROUTINE W REFLEX MICROSCOPIC
Bilirubin Urine: NEGATIVE
Glucose, UA: NEGATIVE mg/dL
Hgb urine dipstick: NEGATIVE
KETONES UR: NEGATIVE mg/dL
NITRITE: NEGATIVE
PH: 5 (ref 5.0–8.0)
Protein, ur: NEGATIVE mg/dL
Specific Gravity, Urine: 1.006 (ref 1.005–1.030)
Urobilinogen, UA: 0.2 mg/dL (ref 0.0–1.0)

## 2015-02-25 LAB — URINE MICROSCOPIC-ADD ON

## 2015-02-25 LAB — DIFFERENTIAL
BASOS ABS: 0 10*3/uL (ref 0.0–0.1)
Basophils Relative: 0 % (ref 0–1)
EOS ABS: 0.1 10*3/uL (ref 0.0–0.7)
EOS PCT: 2 % (ref 0–5)
Lymphocytes Relative: 21 % (ref 12–46)
Lymphs Abs: 1.5 10*3/uL (ref 0.7–4.0)
Monocytes Absolute: 0.6 10*3/uL (ref 0.1–1.0)
Monocytes Relative: 9 % (ref 3–12)
NEUTROS PCT: 68 % (ref 43–77)
Neutro Abs: 4.8 10*3/uL (ref 1.7–7.7)

## 2015-02-25 LAB — PROTIME-INR
INR: 1.63 — ABNORMAL HIGH (ref 0.00–1.49)
PROTHROMBIN TIME: 19.4 s — AB (ref 11.6–15.2)

## 2015-02-25 LAB — CBG MONITORING, ED: Glucose-Capillary: 92 mg/dL (ref 65–99)

## 2015-02-25 LAB — I-STAT TROPONIN, ED: Troponin i, poc: 0.02 ng/mL (ref 0.00–0.08)

## 2015-02-25 LAB — APTT: aPTT: 37 seconds (ref 24–37)

## 2015-02-25 LAB — ETHANOL

## 2015-02-25 MED ORDER — LISINOPRIL 10 MG PO TABS
10.0000 mg | ORAL_TABLET | Freq: Two times a day (BID) | ORAL | Status: DC
  Administered 2015-02-25 – 2015-02-27 (×4): 10 mg via ORAL
  Filled 2015-02-25 (×5): qty 1

## 2015-02-25 MED ORDER — PROPAFENONE HCL 225 MG PO TABS
225.0000 mg | ORAL_TABLET | Freq: Three times a day (TID) | ORAL | Status: DC
  Administered 2015-02-25 – 2015-02-27 (×5): 225 mg via ORAL
  Filled 2015-02-25 (×10): qty 1

## 2015-02-25 MED ORDER — CALCIUM CARBONATE ANTACID 500 MG PO CHEW
1.0000 | CHEWABLE_TABLET | Freq: Every day | ORAL | Status: DC
  Administered 2015-02-26 – 2015-02-27 (×2): 200 mg via ORAL
  Filled 2015-02-25 (×3): qty 1

## 2015-02-25 MED ORDER — VITAMIN D 1000 UNITS PO TABS
5000.0000 [IU] | ORAL_TABLET | ORAL | Status: DC
  Administered 2015-02-26: 5000 [IU] via ORAL
  Filled 2015-02-25 (×2): qty 5

## 2015-02-25 MED ORDER — ACETAMINOPHEN 650 MG RE SUPP: 650.0000 mg | Freq: Four times a day (QID) | RECTAL | Status: DC | PRN

## 2015-02-25 MED ORDER — DICLOFENAC SODIUM 1 % TD GEL
4.0000 g | Freq: Four times a day (QID) | TRANSDERMAL | Status: DC | PRN
  Filled 2015-02-25: qty 100

## 2015-02-25 MED ORDER — WARFARIN - PHARMACIST DOSING INPATIENT
Freq: Every day | Status: DC
  Administered 2015-02-26: 18:00:00

## 2015-02-25 MED ORDER — ONDANSETRON HCL 4 MG PO TABS: 4.0000 mg | ORAL_TABLET | Freq: Four times a day (QID) | ORAL | Status: DC | PRN

## 2015-02-25 MED ORDER — HYDRALAZINE HCL 25 MG PO TABS
25.0000 mg | ORAL_TABLET | Freq: Three times a day (TID) | ORAL | Status: DC
  Administered 2015-02-25 – 2015-02-27 (×5): 25 mg via ORAL
  Filled 2015-02-25 (×6): qty 1

## 2015-02-25 MED ORDER — ENSURE ENLIVE PO LIQD: 237.0000 mL | Freq: Two times a day (BID) | ORAL | Status: DC

## 2015-02-25 MED ORDER — VITAMIN D-3 125 MCG (5000 UT) PO TABS: 1.0000 | ORAL_TABLET | ORAL | Status: DC

## 2015-02-25 MED ORDER — WARFARIN SODIUM 5 MG PO TABS
2.5000 mg | ORAL_TABLET | Freq: Once | ORAL | Status: AC
  Administered 2015-02-25: 2.5 mg via ORAL
  Filled 2015-02-25: qty 1

## 2015-02-25 MED ORDER — EZETIMIBE 10 MG PO TABS
5.0000 mg | ORAL_TABLET | Freq: Every day | ORAL | Status: DC
  Administered 2015-02-25 – 2015-02-27 (×3): 5 mg via ORAL
  Filled 2015-02-25: qty 0.5
  Filled 2015-02-25 (×3): qty 1

## 2015-02-25 MED ORDER — DIPHENOXYLATE-ATROPINE 2.5-0.025 MG/5ML PO LIQD
10.0000 mL | Freq: Four times a day (QID) | ORAL | Status: DC | PRN
Start: 2015-02-25 — End: 2015-02-27

## 2015-02-25 MED ORDER — ONDANSETRON HCL 4 MG/2ML IJ SOLN: 4.0000 mg | Freq: Four times a day (QID) | INTRAMUSCULAR | Status: DC | PRN

## 2015-02-25 MED ORDER — SODIUM CHLORIDE 0.9 % IJ SOLN
3.0000 mL | Freq: Two times a day (BID) | INTRAMUSCULAR | Status: DC
  Administered 2015-02-25 – 2015-02-27 (×3): 3 mL via INTRAVENOUS

## 2015-02-25 MED ORDER — METOPROLOL SUCCINATE ER 100 MG PO TB24
100.0000 mg | ORAL_TABLET | Freq: Every day | ORAL | Status: DC
  Administered 2015-02-26 – 2015-02-27 (×2): 100 mg via ORAL
  Filled 2015-02-25 (×2): qty 1

## 2015-02-25 MED ORDER — ACETAMINOPHEN 325 MG PO TABS: 650.0000 mg | ORAL_TABLET | Freq: Four times a day (QID) | ORAL | Status: DC | PRN

## 2015-02-25 MED ORDER — FUROSEMIDE 40 MG PO TABS
40.0000 mg | ORAL_TABLET | Freq: Every day | ORAL | Status: DC
  Administered 2015-02-26 – 2015-02-27 (×2): 40 mg via ORAL
  Filled 2015-02-25 (×2): qty 1

## 2015-02-25 MED ORDER — PREGABALIN 75 MG PO CAPS
75.0000 mg | ORAL_CAPSULE | Freq: Two times a day (BID) | ORAL | Status: DC
  Administered 2015-02-25 – 2015-02-27 (×4): 75 mg via ORAL
  Filled 2015-02-25 (×4): qty 1

## 2015-02-25 MED ORDER — POTASSIUM CHLORIDE CRYS ER 10 MEQ PO TBCR
10.0000 meq | EXTENDED_RELEASE_TABLET | Freq: Every day | ORAL | Status: DC
  Administered 2015-02-26 – 2015-02-27 (×2): 10 meq via ORAL
  Filled 2015-02-25 (×3): qty 1

## 2015-02-25 NOTE — Telephone Encounter (Signed)
Please cancel. No need to see. Only prn

## 2015-02-25 NOTE — ED Notes (Signed)
Pt reports intermittent right arm weakness since Saturday. At present hand grips equal. Family reports no change in pts speech. Hx of stroke. Face symmetrical. No neuro deficits noted.

## 2015-02-25 NOTE — ED Notes (Signed)
Bed: RESA Expected date:  Expected time:  Means of arrival:  Comments: Triage 2 

## 2015-02-25 NOTE — ED Notes (Signed)
Pt informed that she was being transferred to [facility]

## 2015-02-25 NOTE — ED Notes (Signed)
Neuro at bedside.

## 2015-02-25 NOTE — ED Notes (Signed)
CBG- 92 

## 2015-02-25 NOTE — ED Provider Notes (Signed)
CSN: [id number]     Arrival date & time 02/25/15  1620 History   First MD Initiated Contact with Patient 02/25/15 1631     Chief Complaint  Patient presents with  . Extremity Weakness     (Consider location/radiation/quality/duration/timing/severity/associated sxs/prior Treatment) HPI Comments: Patient with previous history of stroke and multiple TIA presents to the ER because of right sided upper extremity numbness and weakness that began at 3:30 PM while she was at the doctor's office. She was unable to hold items in her right hand or even hold onto the arm of the chair with the hand. She has not had any speech disturbance. She has not noticed any right leg numbness or weakness, but reports bilateral lower extremity neuropathy with constant tingling, might not notice. There is no headache associated. Patient has been experiencing intermittent symptoms of right-sided numbness and tingling over the past weekend. She reports the symptoms did clear and then return again at 3:30.  Patient is a 80 y.o. female presenting with extremity weakness.  Extremity Weakness    Past Medical History  Diagnosis Date  . Pacemaker -Medtronic   . Hypertension   . Peripheral neuropathy   . High cholesterol   . Heart murmur   . Shortness of breath on exertion   . Atrial fibrillation   . History of radiation therapy   . Partial bowel obstruction JAN/2013    recurrent  . Colon polyp 2011  . Osteopenia 02/2013    T score -1.4 FRAX 11%/2.1%  . Stroke     "small stroke & several TIA's occasional visual problems / and occasional speach problems  . Arthritis   . Personal history of skin cancer   . Urinary leakage   . Hx: UTI (urinary tract infection)   . Sleep apnea     unaable to tolerate mask   . Endometrial cancer 2006    Stage IIIc  s/p radiation  . Breast cancer 1999    LOBULAR CARCINOMA IN SITU...  . Ringing in ears   . Orthostatic lightheadedness 11/27/2011  . Small bowel obstruction due to  adhesions 12/28/2013  . Hx of echocardiogram     Echo (02/2014):  EF 55-60%, no RWMA, mild AI, mild MR, mild LAE, PASP 44 mmHg  . Tubular adenoma of colon   . Internal hemorrhoids   . Recurrent Clostridium difficile diarrhea    Past Surgical History  Procedure Laterality Date  . Lymph node resection  2007  . Cholecystectomy  1990's  . Mastectomy  1999    bilaterally w/lymph node dissection  . Abdominal hysterectomy  2006    BSO  . Oophorectomy      BSO  . Exploratory laparotomy  2007    remove abdominal cyst  . Insert / replace / remove pacemaker  2007    initial placement  2007 / REPLACED 2014     . Laparoscopic lysis intestinal adhesions  12/28/2013  . Abdominal adhesion surgery  12/28/2013  . Small intestine surgery  12/28/2013  . Laparoscopic lysis of adhesions N/A 12/28/2013    Procedure: LAPAROSCOPIC LYSIS OF ADHESIONS converted OPEN SMALL BOWEL;  Surgeon: Adin Hector, MD;  Location: [facility];  Service: General;  Laterality: N/A;  . Permanent pacemaker generator change N/A 03/23/2012    Procedure: PERMANENT PACEMAKER GENERATOR CHANGE;  Surgeon: Deboraha Sprang, MD;  Location: [facility] CATH LAB;  Service: Cardiovascular;  Laterality: N/A;  . Colonoscopy with propofol N/A 12/27/2014    Procedure: COLONOSCOPY WITH PROPOFOL;  Surgeon: Jerene Bears, MD;  Location: [facility];  Service: Gastroenterology;  Laterality: N/A;  . Fecal transplant N/A 12/27/2014    Procedure: FECAL TRANSPLANT;  Surgeon: Jerene Bears, MD;  Location: [facility];  Service: Gastroenterology;  Laterality: N/A;   Family History  Problem Relation Age of Onset  . Cancer Mother 10    UTERINE  . Breast cancer Mother 30  . Hypertension Mother   . Uterine cancer Mother   . Cancer Father     PROSTATE  . Hypertension Father   . Colon cancer Neg Hx   . Esophageal cancer Neg Hx   . Rectal cancer Neg Hx   . Stomach cancer Neg Hx    History  Substance Use Topics  . Smoking status: Former Smoker -- 1.00 packs/day for  40 years    Types: Cigarettes    Quit date: 11/26/1998  . Smokeless tobacco: Never Used  . Alcohol Use: No     Comment: 09/14/11 "last drink ~ 08/31/2009"   OB History    Gravida Para Term Preterm AB TAB SAB Ectopic Multiple Living   2 2 2       2      Review of Systems  Musculoskeletal: Positive for extremity weakness.  Neurological: Positive for weakness and numbness.  All other systems reviewed and are negative.     Allergies  Other; Penicillins; Shellfish-derived products; Statins; and Iohexol  Home Medications   Prior to Admission medications   Medication Sig Start Date End Date Taking? Authorizing Provider  calcium carbonate (TUMS - DOSED IN MG ELEMENTAL CALCIUM) 500 MG chewable tablet Chew 1 tablet by mouth daily.   Yes Historical Provider, MD  Cholecalciferol (VITAMIN D-3) 5000 UNITS TABS Take 1 tablet by mouth 4 (four) times a week. Takes on Saturday, Sunday, Tuesday and Thursday   Yes Historical Provider, MD  furosemide (LASIX) 40 MG tablet TAKE 1 TABLET BY MOUTH EVERY DAY 10/24/14  Yes Darlin Coco, MD  hydrALAZINE (APRESOLINE) 25 MG tablet TAKE 1 TABLET BY MOUTH THREE TIMES A DAY 11/29/14  Yes Darlin Coco, MD  lisinopril (PRINIVIL,ZESTRIL) 10 MG tablet Take 1 tablet (10 mg total) by mouth 2 (two) times daily. 10/17/14  Yes Darlin Coco, MD  metoprolol succinate (TOPROL-XL) 100 MG 24 hr tablet TAKE 1 TABLET BY MOUTH EVERY DAY WITH OR IMMEDIATELY FOLLOWING A MEAL 09/20/14  Yes Darlin Coco, MD  potassium chloride SA (K-DUR,KLOR-CON) 10 MEQ tablet Take 1 tablet (10 mEq total) by mouth daily. 02/20/14  Yes Scott T Kathlen Mody, PA-C  pregabalin (LYRICA) 75 MG capsule Take 75 mg by mouth 2 (two) times daily.   Yes Historical Provider, MD  propafenone (RYTHMOL) 225 MG tablet TAKE 1 TABLET (225 MG TOTAL) BY MOUTH EVERY 8 (EIGHT) HOURS. 10/24/14  Yes Darlin Coco, MD  warfarin (COUMADIN) 2.5 MG tablet TAKE 1 TABLET BY MOUTH EVERY DAY OR AS DIRECTED Patient taking  differently: TAKE 1/2 TABLET BY MOUTH EVERY DAY Except on Wednesdays take a whole tablet= 2.5mg  12/10/14  Yes Darlin Coco, MD  ZETIA 10 MG tablet TAKE 1/2 TABLET BY MOUTH EVERY DAY 02/11/15  Yes Darlin Coco, MD   BP 139/102 mmHg  Pulse 68  Temp(Src) 97.7 F (36.5 C) (Oral)  Resp 18  SpO2 98% Physical Exam  Constitutional: She is oriented to person, place, and time. She appears well-developed and well-nourished. No distress.  HENT:  Head: Normocephalic and atraumatic.  Right Ear: Hearing normal.  Left Ear: Hearing normal.  Nose: Nose normal.  Mouth/Throat: Oropharynx is clear and moist and mucous membranes are normal.  Eyes: Conjunctivae and EOM are normal. Pupils are equal, round, and reactive to light.  Neck: Normal range of motion. Neck supple.  Cardiovascular: Regular rhythm, S1 normal and S2 normal.  Exam reveals no gallop and no friction rub.   No murmur heard. Pulmonary/Chest: Effort normal and breath sounds normal. No respiratory distress. She exhibits no tenderness.  Abdominal: Soft. Normal appearance and bowel sounds are normal. There is no hepatosplenomegaly. There is no tenderness. There is no rebound, no guarding, no tenderness at McBurney's point and negative Murphy's sign. No hernia.  Musculoskeletal: Normal range of motion.  Neurological: She is alert and oriented to person, place, and time. She has normal strength. No cranial nerve deficit or sensory deficit. Coordination normal. GCS eye subscore is 4. GCS verbal subscore is 5. GCS motor subscore is 6.  Extraocular muscle movement: normal No visual field cut Pupils: equal and reactive both direct and consensual response is normal No nystagmus present    Sensory function is intact to light touch, pinprick Proprioception intact  Grip strength 5/5 symmetric in upper extremities +/-  pronator drift right arm Normal finger to nose bilaterally  Lower extremity strength 5/5 against gravity Normal heel to shin  bilaterally  Gait: normal   Skin: Skin is warm, dry and intact. No rash noted. No cyanosis.  Psychiatric: She has a normal mood and affect. Her speech is normal and behavior is normal. Thought content normal.  Nursing note and vitals reviewed.   ED Course  Procedures (including critical care time) Labs Review Labs Reviewed  PROTIME-INR - Abnormal; Notable for the following:    Prothrombin Time 19.4 (*)    INR 1.63 (*)    All other components within normal limits  CBC - Abnormal; Notable for the following:    RBC 3.52 (*)    Hemoglobin 10.1 (*)    HCT 31.9 (*)    All other components within normal limits  COMPREHENSIVE METABOLIC PANEL - Abnormal; Notable for the following:    Potassium 3.4 (*)    BUN 26 (*)    Creatinine, Ser 1.40 (*)    Calcium 8.6 (*)    Albumin 3.1 (*)    GFR calc non Af Amer 35 (*)    GFR calc Af Amer 41 (*)    All other components within normal limits  I-STAT CHEM 8, ED - Abnormal; Notable for the following:    BUN 31 (*)    Creatinine, Ser 1.50 (*)    Hemoglobin 10.9 (*)    HCT 32.0 (*)    All other components within normal limits  ETHANOL  APTT  DIFFERENTIAL  URINE RAPID DRUG SCREEN, HOSP PERFORMED  URINALYSIS, ROUTINE W REFLEX MICROSCOPIC (NOT AT [facility])  I-STAT TROPOININ, ED  CBG MONITORING, ED    Imaging Review Ct Head Wo Contrast  02/25/2015   CLINICAL DATA:  Code stroke. Intermittent right arm weakness since Saturday.  EXAM: CT HEAD WITHOUT CONTRAST  TECHNIQUE: Contiguous axial images were obtained from the base of the skull through the vertex without intravenous contrast.  COMPARISON:  03/21/2011  FINDINGS: There is atrophy and chronic small vessel disease changes. No acute intracranial abnormality. Specifically, no hemorrhage, hydrocephalus, mass lesion, acute infarction, or significant intracranial injury. No acute calvarial abnormality. Visualized paranasal sinuses and mastoids clear. Orbital soft tissues unremarkable.  IMPRESSION: No  acute intracranial abnormality.  Atrophy, chronic microvascular disease.   Electronically Signed   By: Lennette Bihari  Dover M.D.   On: 02/25/2015 18:11     EKG Interpretation   Date/Time:  Monday February 25 2015 16:47:25 EDT Ventricular Rate:  62 PR Interval:  235 QRS Duration: 174 QT Interval:  492 QTC Calculation: 500 R Axis:   -79 Text Interpretation:  Atrial-ventricular dual-paced rhythm No further  analysis attempted due to paced rhythm Confirmed by Channa Hazelett  MD,  [place] [phone]) on 02/25/2015 4:55:43 PM      MDM   Final diagnoses:  None  TIA  Patient presented to the ER for evaluation of right arm numbness and weakness. Patient patient reports multiple episodes over the last several days. She does report complete resolution between episodes. Most recent episode began one hour before arrival in the ER. She did not have any significant objective findings of weakness or sensory deficit on examination. Based on her history and presentation, however, code stroke was initiated. There was, however, significant delay in calling out the code stroke as well as obtaining the CT scan. CT did not show any evidence of stroke. Patient has been seen by neurology and it is felt that she is suffering TIA. Based on the stuttering pattern of it over the last 3 days, however, it was recommended that she be admitted to the hospital. Patient does have a history of atrial fibrillation. EKG shows atrial ventricular dual pacing, cannot rule out underlying A. fib. Her INR is subtherapeutic. It is recommended that she have her anticoagulation adjusted.    Orpah Greek, MD 02/25/15 [phone]

## 2015-02-25 NOTE — Telephone Encounter (Signed)
Left a message for patient to call back. 

## 2015-02-25 NOTE — H&P (Signed)
[facility] History and Physical  Yolanda Spears JKD:[id number] DOB: [birth year] DOA: 02/25/2015  Referring physician: Dr. Eston Esters PCP: Thressa Sheller, MD   Chief Complaint: R arm numbness  HPI: Yolanda Spears is a 80 y.o. female  Intermittent episodes of right arm and hand numbness. First episode started on 02/23/2015. Patient states that her "right arm and hand felt as if it did not belong to her," and that "I had no control over my arm. " Was able to move her arm during this episode. First episode lasted a few minutes and then spontaneously resolved. Patient with additional episode on 02/24/2015 which lasted longer. Patient states that today while at her urologist is unable to zip her zipper on her purse. Symptoms resolved again at time of presentation to the ED. Must pay INR check 02/21/2015 showing INR 1.8. Denies any aggravating or alleviating factors. Symptoms are intermittent. History of TIA in the past. Patient states that she has baseline "wobble" to her body movement which has been worse over the last several days.  Intermittent right shoulder pain for the past couple of weeks. Denies any acute trauma. Localized to just the shoulder without radiation. Worse with certain movements especially with overhead reaching.   Review of Systems:  Constitutional:  No weight loss, night sweats, Fevers, chills, fatigue.  HEENT:  No headaches, Difficulty swallowing,Tooth/dental problems,Sore throat,  No sneezing, itching, ear ache, nasal congestion, post nasal drip,  Cardio-vascular:  No chest pain, Orthopnea, PND, swelling in lower extremities, anasarca, dizziness, palpitations  GI:  No heartburn, indigestion, abdominal pain, nausea, vomiting, diarrhea, change in bowel habits, loss of appetite  Resp:   No shortness of breath with exertion or at rest. No excess mucus, no productive cough, No non-productive cough, No coughing up of blood.No change in color of mucus.No wheezing.No  chest wall deformity  Skin:  no rash or lesions.  GU:  no dysuria, change in color of urine, no urgency or frequency. No flank pain.  Musculoskeletal:   No joint pain or swelling. No decreased range of motion. No back pain.  Psych:  No change in mood or affect. No depression or anxiety. No memory loss.   Past Medical History  Diagnosis Date  . Pacemaker -Medtronic   . Hypertension   . Peripheral neuropathy   . High cholesterol   . Heart murmur   . Shortness of breath on exertion   . Atrial fibrillation   . History of radiation therapy   . Partial bowel obstruction JAN/2013    recurrent  . Colon polyp 2011  . Osteopenia 02/2013    T score -1.4 FRAX 11%/2.1%  . Stroke     "small stroke & several TIA's occasional visual problems / and occasional speach problems  . Arthritis   . Personal history of skin cancer   . Urinary leakage   . Hx: UTI (urinary tract infection)   . Sleep apnea     unaable to tolerate mask   . Endometrial cancer 2006    Stage IIIc  s/p radiation  . Breast cancer 1999    LOBULAR CARCINOMA IN SITU...  . Ringing in ears   . Orthostatic lightheadedness 11/27/2011  . Small bowel obstruction due to adhesions 12/28/2013  . Hx of echocardiogram     Echo (02/2014):  EF 55-60%, no RWMA, mild AI, mild MR, mild LAE, PASP 44 mmHg  . Tubular adenoma of colon   . Internal hemorrhoids   . Recurrent Clostridium difficile diarrhea  Past Surgical History  Procedure Laterality Date  . Lymph node resection  2007  . Cholecystectomy  1990's  . Mastectomy  1999    bilaterally w/lymph node dissection  . Abdominal hysterectomy  2006    BSO  . Oophorectomy      BSO  . Exploratory laparotomy  2007    remove abdominal cyst  . Insert / replace / remove pacemaker  2007    initial placement  2007 / REPLACED 2014     . Laparoscopic lysis intestinal adhesions  12/28/2013  . Abdominal adhesion surgery  12/28/2013  . Small intestine surgery  12/28/2013  . Laparoscopic lysis  of adhesions N/A 12/28/2013    Procedure: LAPAROSCOPIC LYSIS OF ADHESIONS converted OPEN SMALL BOWEL;  Surgeon: Adin Hector, MD;  Location: [facility];  Service: General;  Laterality: N/A;  . Permanent pacemaker generator change N/A 03/23/2012    Procedure: PERMANENT PACEMAKER GENERATOR CHANGE;  Surgeon: Deboraha Sprang, MD;  Location: [facility] CATH LAB;  Service: Cardiovascular;  Laterality: N/A;  . Colonoscopy with propofol N/A 12/27/2014    Procedure: COLONOSCOPY WITH PROPOFOL;  Surgeon: Jerene Bears, MD;  Location: [facility];  Service: Gastroenterology;  Laterality: N/A;  . Fecal transplant N/A 12/27/2014    Procedure: FECAL TRANSPLANT;  Surgeon: Jerene Bears, MD;  Location: [facility];  Service: Gastroenterology;  Laterality: N/A;   Social History:  reports that she quit smoking about 16 years ago. Her smoking use included Cigarettes. She has a 40 pack-year smoking history. She has never used smokeless tobacco. She reports that she does not drink alcohol or use illicit drugs.  Allergies  Allergen Reactions  . Other Hives    Contrast dye   . Penicillins Itching  . Shellfish-Derived Products Nausea And Vomiting  . Statins     Myalgias,elevated LFT studies  . Iohexol Hives and Itching    Itching in eyes, one hive on face.    Family History  Problem Relation Age of Onset  . Cancer Mother 22    UTERINE  . Breast cancer Mother 70  . Hypertension Mother   . Uterine cancer Mother   . Cancer Father     PROSTATE  . Hypertension Father   . Colon cancer Neg Hx   . Esophageal cancer Neg Hx   . Rectal cancer Neg Hx   . Stomach cancer Neg Hx      Prior to Admission medications   Medication Sig Start Date End Date Taking? Authorizing Provider  calcium carbonate (TUMS - DOSED IN MG ELEMENTAL CALCIUM) 500 MG chewable tablet Chew 1 tablet by mouth daily.   Yes Historical Provider, MD  Cholecalciferol (VITAMIN D-3) 5000 UNITS TABS Take 1 tablet by mouth 4 (four) times a week. Takes on Saturday,  Sunday, Tuesday and Thursday   Yes Historical Provider, MD  furosemide (LASIX) 40 MG tablet TAKE 1 TABLET BY MOUTH EVERY DAY 10/24/14  Yes Darlin Coco, MD  hydrALAZINE (APRESOLINE) 25 MG tablet TAKE 1 TABLET BY MOUTH THREE TIMES A DAY 11/29/14  Yes Darlin Coco, MD  lisinopril (PRINIVIL,ZESTRIL) 10 MG tablet Take 1 tablet (10 mg total) by mouth 2 (two) times daily. 10/17/14  Yes Darlin Coco, MD  metoprolol succinate (TOPROL-XL) 100 MG 24 hr tablet TAKE 1 TABLET BY MOUTH EVERY DAY WITH OR IMMEDIATELY FOLLOWING A MEAL 09/20/14  Yes Darlin Coco, MD  potassium chloride SA (K-DUR,KLOR-CON) 10 MEQ tablet Take 1 tablet (10 mEq total) by mouth daily. 02/20/14  Yes Scott T  Weaver, PA-C  pregabalin (LYRICA) 75 MG capsule Take 75 mg by mouth 2 (two) times daily.   Yes Historical Provider, MD  propafenone (RYTHMOL) 225 MG tablet TAKE 1 TABLET (225 MG TOTAL) BY MOUTH EVERY 8 (EIGHT) HOURS. 10/24/14  Yes Darlin Coco, MD  warfarin (COUMADIN) 2.5 MG tablet TAKE 1 TABLET BY MOUTH EVERY DAY OR AS DIRECTED Patient taking differently: TAKE 1/2 TABLET BY MOUTH EVERY DAY Except on Wednesdays take a whole tablet= 2.5mg  12/10/14  Yes Darlin Coco, MD  ZETIA 10 MG tablet TAKE 1/2 TABLET BY MOUTH EVERY DAY 02/11/15  Yes Darlin Coco, MD   Physical Exam: Filed Vitals:   02/25/15 1827 02/25/15 1900 02/25/15 2000 02/25/15 2040  BP: 139/102 149/70 155/39 163/66  Pulse: 68  59   Temp:      TempSrc:      Resp: 18 16 18 15   SpO2: 98%  96%     Wt Readings from Last 3 Encounters:  01/21/15 74.844 kg (165 lb)  01/18/15 76.567 kg (168 lb 12.8 oz)  01/10/15 80.74 kg (178 lb)    General:  Appears calm and comfortable Eyes:  PERRL, normal lids, irises & conjunctiva ENT:  grossly normal hearing, lips & tongue Neck:  no LAD, masses or thyromegaly Cardiovascular:  RRR, II/VI systolic murmur. Trace LE edema. Telemetry:  Afib Respiratory:  CTA bilaterally, no w/r/r. Normal respiratory effort. Abdomen:   soft, ntnd Skin:  no rash or induration seen on limited exam Musculoskeletal:  Right arm with full range of motion, empty can negative, behind the head, liftoff negative, cross body without pain, Hawkins minimally positive. No bony abnormality and no tenderness on palpation. Psychiatric:  grossly normal mood and affect, speech fluent and appropriate Neurologic:  Cranial nerves II through XII intact, able to sit unassisted, no dysmetria, moves all extremities incorporated fashion, hand grip strength 5 out of 5 bilaterally.           Labs on Admission:  Basic Metabolic Panel:  Recent Labs Lab 02/25/15 1648 02/25/15 1656  NA 141 141  K 3.4* 3.5  CL 107 105  CO2 22  --   GLUCOSE 96 97  BUN 26* 31*  CREATININE 1.40* 1.50*  CALCIUM 8.6*  --    Liver Function Tests:  Recent Labs Lab 02/25/15 1648  AST 23  ALT 17  ALKPHOS 91  BILITOT 0.8  PROT 7.0  ALBUMIN 3.1*   No results for input(s): LIPASE, AMYLASE in the last 168 hours. No results for input(s): AMMONIA in the last 168 hours. CBC:  Recent Labs Lab 02/25/15 1648 02/25/15 1656  WBC 7.0  --   NEUTROABS 4.8  --   HGB 10.1* 10.9*  HCT 31.9* 32.0*  MCV 90.6  --   PLT 198  --    Cardiac Enzymes: No results for input(s): CKTOTAL, CKMB, CKMBINDEX, TROPONINI in the last 168 hours.  BNP (last 3 results) No results for input(s): BNP in the last 8760 hours.  ProBNP (last 3 results) No results for input(s): PROBNP in the last 8760 hours.  CBG:  Recent Labs Lab 02/25/15 1639  GLUCAP 92    Radiological Exams on Admission: Ct Head Wo Contrast  02/25/2015   CLINICAL DATA:  Code stroke. Intermittent right arm weakness since Saturday.  EXAM: CT HEAD WITHOUT CONTRAST  TECHNIQUE: Contiguous axial images were obtained from the base of the skull through the vertex without intravenous contrast.  COMPARISON:  03/21/2011  FINDINGS: There is atrophy and chronic small vessel  disease changes. No acute intracranial abnormality.  Specifically, no hemorrhage, hydrocephalus, mass lesion, acute infarction, or significant intracranial injury. No acute calvarial abnormality. Visualized paranasal sinuses and mastoids clear. Orbital soft tissues unremarkable.  IMPRESSION: No acute intracranial abnormality.  Atrophy, chronic microvascular disease.   Electronically Signed   By: Rolm Baptise M.D.   On: 02/25/2015 18:11    EKG: Independently reviewed. AV dual paced rhythm  Assessment/Plan Principal Problem:   TIA (transient ischemic attack) Active Problems:   Pacemaker- Dual-chamber-mdt   Partial small bowel obstruction - recurrent - s/p lysis of adhesions 12/28/2013   Chronic diarrhea   HLD (hyperlipidemia)   Neuropathy   Right shoulder pain   Possible TIA: Patient presenting with history of TIA and recurrent episodic right arm and hand weakness and inability to control movement. Currently a symptomatically. Subtherapeutic INR with history of A. Fib. Head CT without acute abnormality. Patient unable to undergo MRI. Evaluate by neurology - Dr. Doy Mince, who is requesting admission and workup. - Admit to neuro telemetry at [facility] - Follow-up on A1c, fasting lipid panel, carotid Dopplers, EEG (per Neurology) - Follow neuro recommendations - PT/OT  R shoulder pain: possible rotator cuff impingement. Unlikely to be contributing to R arm dysfunction as mentioned above.  - PT - Voltaren Gel  Afib: chronic. Paced. Rate controlled. INR 1.88\ - Coumadin - continue Rythmol, bblocker,   Chronic diarrhea: Complex past medical GI history including small bowel obstructions, recent chronic C. difficile infection. No active infection the patient with chronic daily diarrhea - Lomotil when necessary  Neuropathy: - continue lyrica  Chronic Diastolid CHF: no acute exacerbation. Echo from 2015 showing EF 55-60% - continue lasix  HLD: - continue Zetia  Code Status: FUll DVT Prophylaxis: Coumadin Family Communication:  Sister Disposition Plan: pending improvement   Shanyah Gattuso Lenna Sciara, MD Family Medicine [facility] [email or web address] Password [email or web address]

## 2015-02-25 NOTE — Telephone Encounter (Signed)
Sorry to hear no response to gluten-free challenge Can resume regular diet If diarrhea is continuing as before then recommend a trial of Viberzi 100 mg twice daily (assuming insurance will cover) If diarrhea is not as bad then await follow-up If she begins this medicine call for any abdominal pain, nausea, vomiting or constipation

## 2015-02-25 NOTE — Progress Notes (Signed)
Pt admitted from [facility] ED with c/o of weakness and numbness in the right hand that has resolved, pt denies any pain, settled in bed with call light at bedside, will however continue to monitor. Obasogie-Asidi, Abri Vacca Efe

## 2015-02-25 NOTE — Consult Note (Signed)
Referring Physician: Pollina    Chief Complaint: Right arm numbness  HPI: Yolanda Spears is an 80 y.o. female who reports that on 6/18 she had an episode when her "right arm and hand felt as if it did not belong to her".  This lasted for a few minutes and resolved.  Then she had another episode on 6/19 that lasted a little longer.  Today when she was at the Urologist she noted that she was unable to handle the zipper on her purse with her right hand.  It is unclear to her when she lost functional use of her right hand since she is not clear when she last tried to use it.  Her sister encouraged her to seek medical attention.  Symptoms have now resolved. Patient has a history of atrial fibrillation on Coumadin.  INR last checked on 6/16 and was low at 1.8.    Date last known well: Date: 02/25/2015 Time last known well: Unable to determine tPA Given: No: Resolution of symptoms  MRankin: 0  Past Medical History  Diagnosis Date  . Pacemaker -Medtronic   . Hypertension   . Peripheral neuropathy   . High cholesterol   . Heart murmur   . Shortness of breath on exertion   . Atrial fibrillation   . History of radiation therapy   . Partial bowel obstruction JAN/2013    recurrent  . Colon polyp 2011  . Osteopenia 02/2013    T score -1.4 FRAX 11%/2.1%  . Stroke     "small stroke & several TIA's occasional visual problems / and occasional speach problems  . Arthritis   . Personal history of skin cancer   . Urinary leakage   . Hx: UTI (urinary tract infection)   . Sleep apnea     unaable to tolerate mask   . Endometrial cancer 2006    Stage IIIc  s/p radiation  . Breast cancer 1999    LOBULAR CARCINOMA IN SITU...  . Ringing in ears   . Orthostatic lightheadedness 11/27/2011  . Small bowel obstruction due to adhesions 12/28/2013  . Hx of echocardiogram     Echo (02/2014):  EF 55-60%, no RWMA, mild AI, mild MR, mild LAE, PASP 44 mmHg  . Tubular adenoma of colon   . Internal hemorrhoids    . Recurrent Clostridium difficile diarrhea     Past Surgical History  Procedure Laterality Date  . Lymph node resection  2007  . Cholecystectomy  1990's  . Mastectomy  1999    bilaterally w/lymph node dissection  . Abdominal hysterectomy  2006    BSO  . Oophorectomy      BSO  . Exploratory laparotomy  2007    remove abdominal cyst  . Insert / replace / remove pacemaker  2007    initial placement  2007 / REPLACED 2014     . Laparoscopic lysis intestinal adhesions  12/28/2013  . Abdominal adhesion surgery  12/28/2013  . Small intestine surgery  12/28/2013  . Laparoscopic lysis of adhesions N/A 12/28/2013    Procedure: LAPAROSCOPIC LYSIS OF ADHESIONS converted OPEN SMALL BOWEL;  Surgeon: Adin Hector, MD;  Location: [facility];  Service: General;  Laterality: N/A;  . Permanent pacemaker generator change N/A 03/23/2012    Procedure: PERMANENT PACEMAKER GENERATOR CHANGE;  Surgeon: Deboraha Sprang, MD;  Location: [facility] CATH LAB;  Service: Cardiovascular;  Laterality: N/A;  . Colonoscopy with propofol N/A 12/27/2014    Procedure: COLONOSCOPY WITH PROPOFOL;  Surgeon: Ulice Dash  Everitt Amber, MD;  Location: [facility];  Service: Gastroenterology;  Laterality: N/A;  . Fecal transplant N/A 12/27/2014    Procedure: FECAL TRANSPLANT;  Surgeon: Jerene Bears, MD;  Location: [facility] ENDOSCOPY;  Service: Gastroenterology;  Laterality: N/A;    Family History  Problem Relation Age of Onset  . Cancer Mother 38    UTERINE  . Breast cancer Mother 40  . Hypertension Mother   . Uterine cancer Mother   . Cancer Father     PROSTATE  . Hypertension Father   . Colon cancer Neg Hx   . Esophageal cancer Neg Hx   . Rectal cancer Neg Hx   . Stomach cancer Neg Hx    Social History:  reports that she quit smoking about 16 years ago. Her smoking use included Cigarettes. She has a 40 pack-year smoking history. She has never used smokeless tobacco. She reports that she does not drink alcohol or use illicit drugs.  Allergies:   Allergies  Allergen Reactions  . Other Hives    Contrast dye   . Penicillins Itching  . Shellfish-Derived Products Nausea And Vomiting  . Statins     Myalgias,elevated LFT studies  . Iohexol Hives and Itching    Itching in eyes, one hive on face.    Medications: I have reviewed the patient's current medications. Prior to Admission:  Current outpatient prescriptions:  .  calcium carbonate (TUMS - DOSED IN MG ELEMENTAL CALCIUM) 500 MG chewable tablet, Chew 1 tablet by mouth daily., Disp: , Rfl:  .  Cholecalciferol (VITAMIN D-3) 5000 UNITS TABS, Take 1 tablet by mouth 4 (four) times a week. Takes on Saturday, Sunday, Tuesday and Thursday, Disp: , Rfl:  .  furosemide (LASIX) 40 MG tablet, TAKE 1 TABLET BY MOUTH EVERY DAY, Disp: 90 tablet, Rfl: 1 .  hydrALAZINE (APRESOLINE) 25 MG tablet, TAKE 1 TABLET BY MOUTH THREE TIMES A DAY, Disp: 270 tablet, Rfl: 1 .  lisinopril (PRINIVIL,ZESTRIL) 10 MG tablet, Take 1 tablet (10 mg total) by mouth 2 (two) times daily., Disp: 180 tablet, Rfl: 3 .  metoprolol succinate (TOPROL-XL) 100 MG 24 hr tablet, TAKE 1 TABLET BY MOUTH EVERY DAY WITH OR IMMEDIATELY FOLLOWING A MEAL, Disp: 90 tablet, Rfl: 1 .  potassium chloride SA (K-DUR,KLOR-CON) 10 MEQ tablet, Take 1 tablet (10 mEq total) by mouth daily., Disp: 30 tablet, Rfl: 11 .  pregabalin (LYRICA) 75 MG capsule, Take 75 mg by mouth 2 (two) times daily., Disp: , Rfl:  .  propafenone (RYTHMOL) 225 MG tablet, TAKE 1 TABLET (225 MG TOTAL) BY MOUTH EVERY 8 (EIGHT) HOURS., Disp: 270 tablet, Rfl: 1 .  warfarin (COUMADIN) 2.5 MG tablet, TAKE 1 TABLET BY MOUTH EVERY DAY OR AS DIRECTED (Patient taking differently: TAKE 1/2 TABLET BY MOUTH EVERY DAY Except on Wednesdays take a whole tablet= 2.5mg ), Disp: 30 tablet, Rfl: 3 .  ZETIA 10 MG tablet, TAKE 1/2 TABLET BY MOUTH EVERY DAY, Disp: 15 tablet, Rfl: 9  ROS: History obtained from the patient  General ROS: negative for - chills, fatigue, fever, night sweats, weight  gain or weight loss Psychological ROS: negative for - behavioral disorder, hallucinations, memory difficulties, mood swings or suicidal ideation Ophthalmic ROS: negative for - blurry vision, double vision, eye pain or loss of vision ENT ROS: negative for - epistaxis, nasal discharge, oral lesions, sore throat, tinnitus or vertigo Allergy and Immunology ROS: negative for - hives or itchy/watery eyes Hematological and Lymphatic ROS: negative for - bleeding problems, bruising or  swollen lymph nodes Endocrine ROS: negative for - galactorrhea, hair pattern changes, polydipsia/polyuria or temperature intolerance Respiratory ROS: negative for - cough, hemoptysis, shortness of breath or wheezing Cardiovascular ROS: negative for - chest pain, dyspnea on exertion, edema or irregular heartbeat Gastrointestinal ROS: chronic diarrhea Genito-Urinary ROS: negative for - dysuria, hematuria, incontinence or urinary frequency/urgency Musculoskeletal ROS: right shoulder pain Neurological ROS: as noted in HPI Dermatological ROS: negative for rash and skin lesion changes  Physical Examination: Blood pressure 139/102, pulse 68, temperature 97.7 F (36.5 C), temperature source Oral, resp. rate 18, SpO2 98 %.  HEENT-  Normocephalic, no lesions, without obvious abnormality.  Normal external eye and conjunctiva.  Normal TM's bilaterally.  Normal auditory canals and external ears. Normal external nose, mucus membranes and septum.  Normal pharynx. Cardiovascular- S1, S2 normal, + murmur, pulses palpable throughout   Lungs- chest clear, no wheezing, rales, normal symmetric air entry Abdomen- soft, non-tender; bowel sounds normal; no masses,  no organomegaly Extremities- minimal BLE edema Lymph-no adenopathy palpable Musculoskeletal-pain on palpation of the posterior portion of the right shoulder Skin-warm and dry, no hyperpigmentation, vitiligo, or suspicious lesions  Neurological Examination Mental Status: Alert,  oriented, thought content appropriate.  Speech fluent without evidence of aphasia.  Able to follow 3 step commands without difficulty. Cranial Nerves: II: Discs flat bilaterally; Visual fields grossly normal, pupils equal, round, reactive to light and accommodation III,IV, VI: ptosis not present, extra-ocular motions intact bilaterally V,VII: mild decrease in right NLF, facial light touch sensation normal bilaterally VIII: hearing normal bilaterally IX,X: gag reflex present XI: bilateral shoulder shrug XII: midline tongue extension Motor: Right : Upper extremity   5/5    Left:     Upper extremity   5/5  Lower extremity   5/5     Lower extremity   5/5 Tone and bulk:normal tone throughout; no atrophy noted Sensory: Pinprick and light touch intact in the upper extremities and altered symmetrically below the knees  Deep Tendon Reflexes: 2+ and symmetric with absent AJ's bilaterally Plantars: Right: mute   Left: mute Cerebellar: normal finger-to-nose testing bilaterally.  Mild dysmetria with heel-to-shin testing using the RLE.  Intact on the left.     Laboratory Studies:  Basic Metabolic Panel:  Recent Labs Lab 02/25/15 1648 02/25/15 1656  NA 141 141  K 3.4* 3.5  CL 107 105  CO2 22  --   GLUCOSE 96 97  BUN 26* 31*  CREATININE 1.40* 1.50*  CALCIUM 8.6*  --     Liver Function Tests:  Recent Labs Lab 02/25/15 1648  AST 23  ALT 17  ALKPHOS 91  BILITOT 0.8  PROT 7.0  ALBUMIN 3.1*   No results for input(s): LIPASE, AMYLASE in the last 168 hours. No results for input(s): AMMONIA in the last 168 hours.  CBC:  Recent Labs Lab 02/25/15 1648 02/25/15 1656  WBC 7.0  --   NEUTROABS 4.8  --   HGB 10.1* 10.9*  HCT 31.9* 32.0*  MCV 90.6  --   PLT 198  --     Cardiac Enzymes: No results for input(s): CKTOTAL, CKMB, CKMBINDEX, TROPONINI in the last 168 hours.  BNP: Invalid input(s): POCBNP  CBG:  Recent Labs Lab 02/25/15 1639  GLUCAP 92     Microbiology: Results for orders placed or performed in visit on 01/23/15  Clostridium Difficile by PCR     Status: None   Collection Time: 01/23/15  4:51 PM  Result Value Ref Range Status   C difficile  by pcr Not Detected Not Detected Final    Comment: This test is for use only with liquid or soft stools; performance characteristics of other clinical specimen types have not been established.   This assay was performed by Cepheid GeneXpert(R) PCR. The performance characteristics of this assay have been determined by [employer]. Performance characteristics refer to the analytical performance of the test.   Stool culture     Status: None   Collection Time: 01/23/15  4:51 PM  Result Value Ref Range Status   Organism ID, Bacteria No Salmonella,Shigella,Campylobacter,Yersinia,or  Final   Organism ID, Bacteria No E.coli [id number]:H7 isolated.  Final    Coagulation Studies:  Recent Labs  02/25/15 1648  LABPROT 19.4*  INR 1.63*    Urinalysis: No results for input(s): COLORURINE, LABSPEC, PHURINE, GLUCOSEU, HGBUR, BILIRUBINUR, KETONESUR, PROTEINUR, UROBILINOGEN, NITRITE, LEUKOCYTESUR in the last 168 hours.  Invalid input(s): APPERANCEUR  Lipid Panel:    Component Value Date/Time   CHOL 188 03/26/2011 1126   TRIG 135.0 03/26/2011 1126   HDL 43.80 03/26/2011 1126   CHOLHDL 4 03/26/2011 1126   VLDL 27.0 03/26/2011 1126   LDLCALC 117* 03/26/2011 1126    HgbA1C:  Lab Results  Component Value Date   HGBA1C 5.7* 03/19/2011    Urine Drug Screen:  No results found for: LABOPIA, COCAINSCRNUR, LABBENZ, AMPHETMU, THCU, LABBARB  Alcohol Level:  Recent Labs Lab 02/25/15 [place] <5    Other results: EKG: Atrial-ventricular dual paced rhythm.  62 bpm.  Imaging: Ct Head Wo Contrast  02/25/2015   CLINICAL DATA:  Code stroke. Intermittent right arm weakness since Saturday.  EXAM: CT HEAD WITHOUT CONTRAST  TECHNIQUE: Contiguous axial images were obtained from the  base of the skull through the vertex without intravenous contrast.  COMPARISON:  03/21/2011  FINDINGS: There is atrophy and chronic small vessel disease changes. No acute intracranial abnormality. Specifically, no hemorrhage, hydrocephalus, mass lesion, acute infarction, or significant intracranial injury. No acute calvarial abnormality. Visualized paranasal sinuses and mastoids clear. Orbital soft tissues unremarkable.  IMPRESSION: No acute intracranial abnormality.  Atrophy, chronic microvascular disease.   Electronically Signed   By: Rolm Baptise M.D.   On: 02/25/2015 18:11    Assessment: 80 y.o. female presenting with three episodes of right arm numbness and weakness.  Symptoms now resolved.  Patient on Coumadin with a subtherapeutic INR.  Head CT personally reviewed and shows no acute changes.  TIA suspected.  Further work up recommended though since symptoms have been stereotypical.      Stroke Risk Factors - atrial fibrillation, hyperlipidemia and hypertension  Plan: 1. HgbA1c, fasting lipid panel 2. PT consult, OT consult, Speech consult 3. Adjustment of Coumadin for target INR of 2-3 4. Carotid dopplers 5. NPO until RN stroke swallow screen 6. Telemetry monitoring 7. Frequent neuro checks 8. EEG  Case discussed with Dr. Charlann Noss, MD [facility] [phone] 02/25/2015, 6:33 PM

## 2015-02-25 NOTE — ED Notes (Signed)
Pt back from CT

## 2015-02-25 NOTE — ED Notes (Signed)
md at bedside

## 2015-02-25 NOTE — Telephone Encounter (Signed)
Appointment cancelled

## 2015-02-25 NOTE — Progress Notes (Signed)
ANTICOAGULATION CONSULT NOTE - Initial Consult  Pharmacy Consult for warfarin Indication: atrial fibrillation  Allergies  Allergen Reactions  . Other Hives    Contrast dye   . Penicillins Itching  . Shellfish-Derived Products Nausea And Vomiting  . Statins     Myalgias,elevated LFT studies  . Iohexol Hives and Itching    Itching in eyes, one hive on face.    Patient Measurements:   Heparin Dosing Weight:   Vital Signs: Temp: 97.7 F (36.5 C) (06/20 1622) Temp Source: Oral (06/20 1622) BP: 144/94 mmHg (06/20 2120) Pulse Rate: 60 (06/20 2100)  Labs:  Recent Labs  02/25/15 1648 02/25/15 1656  HGB 10.1* 10.9*  HCT 31.9* 32.0*  PLT 198  --   APTT 37  --   LABPROT 19.4*  --   INR 1.63*  --   CREATININE 1.40* 1.50*    CrCl cannot be calculated (Unknown ideal weight.).   Medical History: Past Medical History  Diagnosis Date  . Pacemaker -Medtronic   . Hypertension   . Peripheral neuropathy   . High cholesterol   . Heart murmur   . Shortness of breath on exertion   . Atrial fibrillation   . History of radiation therapy   . Partial bowel obstruction JAN/2013    recurrent  . Colon polyp 2011  . Osteopenia 02/2013    T score -1.4 FRAX 11%/2.1%  . Stroke     "small stroke & several TIA's occasional visual problems / and occasional speach problems  . Arthritis   . Personal history of skin cancer   . Urinary leakage   . Hx: UTI (urinary tract infection)   . Sleep apnea     unaable to tolerate mask   . Endometrial cancer 2006    Stage IIIc  s/p radiation  . Breast cancer 1999    LOBULAR CARCINOMA IN SITU...  . Ringing in ears   . Orthostatic lightheadedness 11/27/2011  . Small bowel obstruction due to adhesions 12/28/2013  . Hx of echocardiogram     Echo (02/2014):  EF 55-60%, no RWMA, mild AI, mild MR, mild LAE, PASP 44 mmHg  . Tubular adenoma of colon   . Internal hemorrhoids   . Recurrent Clostridium difficile diarrhea     Assessment: 64 YOF  presents with R arm numbness.  Concern for TIA, on warfarin for afib but INR subtherapeutic at presentation.  CT head reveals no acute abnormality. Last warfarin dose 6/19  Home dose: warfarin 1.25mg  daily with 2.5mg  on Wed  Today, I6/20/2016:  INR = 1.63  CBC: Hgb = 10.9, ptlc WNL  Goal of Therapy:  INR 2-3  Plan:   Warfarin 2.5mg  PO x 1 for subtherapeutic INR  Daily INR  Monitor for bleeding   Doreene Eland, PharmD, BCPS.   Pager: [phone]  02/25/2015,9:55 PM

## 2015-02-25 NOTE — ED Notes (Addendum)
Pt states that she has a hx of TIA and stroke and at appprox. 3:30pm today she started having weakness in her R arm and is unable to hold anything in that hand. Grips are equal during exam. Neuro intact. No facial droop. Alert and oriented.

## 2015-02-26 ENCOUNTER — Ambulatory Visit: Payer: Medicare Other | Admitting: Internal Medicine

## 2015-02-26 ENCOUNTER — Inpatient Hospital Stay (HOSPITAL_COMMUNITY): Payer: Medicare Other

## 2015-02-26 DIAGNOSIS — M25511 Pain in right shoulder: Secondary | ICD-10-CM

## 2015-02-26 DIAGNOSIS — G458 Other transient cerebral ischemic attacks and related syndromes: Secondary | ICD-10-CM

## 2015-02-26 DIAGNOSIS — I4891 Unspecified atrial fibrillation: Secondary | ICD-10-CM

## 2015-02-26 DIAGNOSIS — R531 Weakness: Secondary | ICD-10-CM

## 2015-02-26 DIAGNOSIS — G459 Transient cerebral ischemic attack, unspecified: Secondary | ICD-10-CM

## 2015-02-26 LAB — CK: CK TOTAL: 61 U/L (ref 38–234)

## 2015-02-26 LAB — PROTIME-INR
INR: 1.85 — ABNORMAL HIGH (ref 0.00–1.49)
Prothrombin Time: 21.3 seconds — ABNORMAL HIGH (ref 11.6–15.2)

## 2015-02-26 LAB — CLOSTRIDIUM DIFFICILE BY PCR: Toxigenic C. Difficile by PCR: NEGATIVE

## 2015-02-26 LAB — SEDIMENTATION RATE: Sed Rate: 90 mm/hr — ABNORMAL HIGH (ref 0–22)

## 2015-02-26 MED ORDER — BOOST / RESOURCE BREEZE PO LIQD
1.0000 | Freq: Two times a day (BID) | ORAL | Status: DC
  Administered 2015-02-26 – 2015-02-27 (×2): 1 via ORAL

## 2015-02-26 MED ORDER — WARFARIN SODIUM 5 MG PO TABS
2.5000 mg | ORAL_TABLET | Freq: Once | ORAL | Status: AC
  Administered 2015-02-26: 2.5 mg via ORAL
  Filled 2015-02-26: qty 1

## 2015-02-26 NOTE — Progress Notes (Signed)
Initial Nutrition Assessment  DOCUMENTATION CODES:  Not applicable  INTERVENTION:  -D/c Ensure Enlive due to poor acceptance -Resource Breeze po BID, each supplement provides 250 kcal and 9 grams of protein  NUTRITION DIAGNOSIS:  Predicted suboptimal nutrient intake related to other (see comment) (decreased appetite) as evidenced by per patient/family report, percent weight loss.  GOAL:  Patient will meet greater than or equal to 90% of their needs   MONITOR:  PO intake, Supplement acceptance, Diet advancement, Labs, Weight trends, Skin, I & O's  REASON FOR ASSESSMENT:  Malnutrition Screening Tool    ASSESSMENT: Yolanda Spears is a 80 y.o. female with Intermittent episodes of right arm and hand numbness. First episode started on 02/23/2015. Patient states that her "right arm and hand felt as if it did not belong to her," and that "I had no control over my arm. " Was able to move her arm during this episode. First episode lasted a few minutes and then spontaneously resolved. Patient with additional episode on 02/24/2015 which lasted longer. Patient states that today while at her urologist is unable to zip her zipper on her purse. Symptoms resolved again at time of presentation to the ED. Must pay INR check 02/21/2015 showing INR 1.8. Denies any aggravating or alleviating factors. Symptoms are intermittent. History of TIA in the past.  Pt admitted with TIA.   Attempted to see pt x 3 today, however, pt was in with therapy and MD at times of multiple visits.   Wt hx reveals progressive wt loss over the past > 1 year, including a 7.9% wt loss over the past 3 months. Suspect inadequate oral intake PTA. Per MAR, pt is refusing Ensure supplement.   Height:  Ht Readings from Last 1 Encounters:  02/25/15 5\' 7"  (1.702 m)    Weight:  Wt Readings from Last 1 Encounters:  02/25/15 164 lb 6.4 oz (74.571 kg)    Ideal Body Weight:  61.4 kg  Wt Readings from Last 10 Encounters:   02/25/15 164 lb 6.4 oz (74.571 kg)  01/21/15 165 lb (74.844 kg)  01/18/15 168 lb 12.8 oz (76.567 kg)  01/10/15 178 lb (80.74 kg)  12/27/14 178 lb (80.74 kg)  11/22/14 178 lb (80.74 kg)  09/12/14 179 lb (81.194 kg)  07/31/14 176 lb (79.833 kg)  07/19/14 176 lb 9.6 oz (80.105 kg)  07/02/14 175 lb (79.379 kg)    BMI:  Body mass index is 25.74 kg/(m^2).  Estimated Nutritional Needs:  Kcal:  1650-1850  Protein:  70-80 grams  Fluid:  1.6-1.8 L  Skin:  Reviewed, no issues  Diet Order:  Diet Heart Room service appropriate?: Yes; Fluid consistency:: Thin  EDUCATION NEEDS:  No education needs identified at this time  No intake or output data in the 24 hours ending 02/26/15 1528  Last BM:  02/26/15  Myron Stankovich A. Jimmye Norman, RD, LDN, CDE Pager: [phone] After hours Pager: [phone]

## 2015-02-26 NOTE — Progress Notes (Signed)
  Echocardiogram 2D Echocardiogram has been performed.  Diamond Nickel 02/26/2015, 3:05 PM

## 2015-02-26 NOTE — Telephone Encounter (Signed)
Left a message for patient to call back. 

## 2015-02-26 NOTE — Telephone Encounter (Signed)
Spoke with patient's sister and she has been admitted with weakness in left hand. ?Stroke. She will give patient information and have her call us after discharge.

## 2015-02-26 NOTE — Evaluation (Signed)
Occupational Therapy Evaluation Patient Details Name: VISTA SAWATZKY MRN: [id number] DOB: Oct 20, 1934 Today's Date: 02/26/2015    History of Present Illness 80 yo female admitted with R arm and hand numbness. First episode started on 02/23/15. Pt reports she "had no control over my arm", lasting a few minutes. PMH: pacemaker, peripheral neuropathy, A fib, SOB on exertion, previous TIA's   Clinical Impression   Pt reports having episode of R arm numbness/ weakness this morning while brushing teeth with nurse tech. Pt denies UE sensation or vision deficits; reports LE peripheral neuropathy. Pt lives alone, does not drive, and sister provides transportation when available. Pt reports difficulty with remembering to prepare meals, and states children are looking into meal delivery options. Pt would benefit from skilled OT for increased safety and independence with ADLs in preparation for return home.     Follow Up Recommendations  Home health OT;Supervision - Intermittent    Equipment Recommendations  None recommended by OT    Recommendations for Other Services       Precautions / Restrictions Precautions Precautions: Fall Restrictions Weight Bearing Restrictions: No      Mobility Bed Mobility Overal bed mobility: Needs Assistance Bed Mobility: Supine to Sit;Sit to Supine     Supine to sit: Min guard Sit to supine: Supervision      Transfers Overall transfer level: Needs assistance Equipment used: Rolling walker (2 wheeled) Transfers: Sit to/from Stand Sit to Stand: Min guard              Balance Overall balance assessment: No apparent balance deficits (not formally assessed)                                          ADL Overall ADL's : Needs assistance/impaired Eating/Feeding: Independent;Sitting   Grooming: Wash/dry hands;Supervision/safety;Standing   Upper Body Bathing: Supervision/ safety;Sitting   Lower Body Bathing: Supervison/  safety;Sit to/from stand   Upper Body Dressing : Supervision/safety;Sitting   Lower Body Dressing: Supervision/safety;Sit to/from stand   Toilet Transfer: Supervision/safety;Ambulation;RW   Toileting- Clothing Manipulation and Hygiene: Modified independent;Sit to/from stand       Functional mobility during ADLs: Min guard;Rolling walker General ADL Comments: Pt reports R UE weakness occurs during functional activities such as brushing teeth     Vision Vision Assessment?: No apparent visual deficits   Perception     Praxis      Pertinent Vitals/Pain Pain Assessment: Faces Faces Pain Scale: Hurts little more Pain Location: R shoulder Pain Descriptors / Indicators: Aching;Sore Pain Intervention(s): Monitored during session;Repositioned     Hand Dominance Right   Extremity/Trunk Assessment Upper Extremity Assessment Upper Extremity Assessment: Overall WFL for tasks assessed (Pt reports episodes of weakness in R UE)   Lower Extremity Assessment Lower Extremity Assessment: Overall WFL for tasks assessed       Communication Communication Communication: No difficulties   Cognition Arousal/Alertness: Awake/alert Behavior During Therapy: WFL for tasks assessed/performed Overall Cognitive Status: Within Functional Limits for tasks assessed                     General Comments       Exercises       Shoulder Instructions      Home Living Family/patient expects to be discharged to:: Private residence Living Arrangements: Alone Available Help at Discharge: Family;Available PRN/intermittently (Sister) Type of Home: Apartment (condo) Home Access: Level entry  Home Layout: One level     Bathroom Shower/Tub: Walk-in shower;Door   [employer] Toilet: Standard     Home Equipment: Clinical cytogeneticist - 4 wheels;Cane - single point;Grab bars - toilet;Grab bars - tub/shower;Hand held shower head;Adaptive equipment Adaptive Equipment: Reacher Additional  Comments: Pt does not drive; reports sister provides transportation; children are not local; minimal cooking - reports family looking into premade meal options and meal delivery      Prior Functioning/Environment Level of Independence: Independent             OT Diagnosis: Generalized weakness   OT Problem List: Decreased strength;Decreased activity tolerance;Decreased safety awareness;Decreased knowledge of use of DME or AE;Impaired UE functional use;Impaired sensation   OT Treatment/Interventions: Self-care/ADL training;Therapeutic exercise;DME and/or AE instruction;Therapeutic activities;Patient/family education;Balance training    OT Goals(Current goals can be found in the care plan section) Acute Rehab OT Goals Patient Stated Goal: to get better OT Goal Formulation: With patient Time For Goal Achievement: 03/12/15 Potential to Achieve Goals: Good  OT Frequency: Min 2X/week   Barriers to D/C: Decreased caregiver support  Pt lives alone; does not drive; sister is local and can assist PRN with transportation; children are not local       Co-evaluation              End of Session Equipment Utilized During Treatment: Gait belt;Rolling walker Nurse Communication: Mobility status  Activity Tolerance: Patient tolerated treatment well Patient left: in bed;with call bell/phone within reach;with nursing/sitter in room   Time: 0950-1012 OT Time Calculation (min): 22 min Charges:    G-Codes:    Forest Gleason 02/26/2015, 10:28 AM

## 2015-02-26 NOTE — Progress Notes (Signed)
STROKE TEAM PROGRESS NOTE   HISTORY Yolanda Spears is an 80 y.o. female who reports that on 6/18 she had an episode when her "right arm and hand felt as if it did not belong to her". This lasted for a few minutes and resolved. Then she had another episode on 6/19 that lasted a little longer. Today 02/25/2015 when she was at the Urologist she noted that she was unable to handle the zipper on her purse with her right hand (LKW time undetermined). It is unclear to her when she lost functional use of her right hand since she is not clear when she last tried to use it. Her sister encouraged her to seek medical attention. Symptoms have now resolved. Patient has a history of atrial fibrillation on Coumadin. INR last checked on 6/16 and was low at 1.8. Patient was not administered TPA secondary to Resolution of symptoms. She was admitted for further evaluation and treatment.   SUBJECTIVE (INTERVAL HISTORY) Her RN is at the bedside.  Overall she feels her condition is completely resolved.    OBJECTIVE Temp:  [97.7 F (36.5 C)-98.2 F (36.8 C)] 97.9 F (36.6 C) (06/21 0850) Pulse Rate:  [59-88] 88 (06/21 0850) Cardiac Rhythm:  [-] A-V Sequential paced (06/21 0600) Resp:  [12-19] 18 (06/21 0850) BP: (120-181)/(39-114) 131/43 mmHg (06/21 0850) SpO2:  [95 %-100 %] 97 % (06/21 0850) Weight:  [74.571 kg (164 lb 6.4 oz)] 74.571 kg (164 lb 6.4 oz) (06/20 2202)   Recent Labs Lab 02/25/15 1639  GLUCAP 92    Recent Labs Lab 02/25/15 1648 02/25/15 1656  NA 141 141  K 3.4* 3.5  CL 107 105  CO2 22  --   GLUCOSE 96 97  BUN 26* 31*  CREATININE 1.40* 1.50*  CALCIUM 8.6*  --     Recent Labs Lab 02/25/15 1648  AST 23  ALT 17  ALKPHOS 91  BILITOT 0.8  PROT 7.0  ALBUMIN 3.1*    Recent Labs Lab 02/25/15 1648 02/25/15 1656  WBC 7.0  --   NEUTROABS 4.8  --   HGB 10.1* 10.9*  HCT 31.9* 32.0*  MCV 90.6  --   PLT 198  --    No results for input(s): CKTOTAL, CKMB, CKMBINDEX,  TROPONINI in the last 168 hours.  Recent Labs  02/25/15 1648 02/26/15 0626  LABPROT 19.4* 21.3*  INR 1.63* 1.85*    Recent Labs  02/25/15 1850  COLORURINE YELLOW  LABSPEC 1.006  PHURINE 5.0  GLUCOSEU NEGATIVE  HGBUR NEGATIVE  BILIRUBINUR NEGATIVE  KETONESUR NEGATIVE  PROTEINUR NEGATIVE  UROBILINOGEN 0.2  NITRITE NEGATIVE  LEUKOCYTESUR LARGE*       Component Value Date/Time   CHOL 188 03/26/2011 1126   TRIG 135.0 03/26/2011 1126   HDL 43.80 03/26/2011 1126   CHOLHDL 4 03/26/2011 1126   VLDL 27.0 03/26/2011 1126   LDLCALC 117* 03/26/2011 1126   Lab Results  Component Value Date   HGBA1C 5.7* 03/19/2011      Component Value Date/Time   LABOPIA NONE DETECTED 02/25/2015 1620   COCAINSCRNUR NONE DETECTED 02/25/2015 1620   LABBENZ NONE DETECTED 02/25/2015 1620   AMPHETMU NONE DETECTED 02/25/2015 1620   THCU NONE DETECTED 02/25/2015 1620   LABBARB NONE DETECTED 02/25/2015 1620     Recent Labs Lab 02/25/15 1648  ETH <5    Ct Head Wo Contrast  02/25/2015   CLINICAL DATA:  Code stroke. Intermittent right arm weakness since Saturday.  EXAM: CT HEAD WITHOUT CONTRAST  TECHNIQUE: Contiguous axial images were obtained from the base of the skull through the vertex without intravenous contrast.  COMPARISON:  03/21/2011  FINDINGS: There is atrophy and chronic small vessel disease changes. No acute intracranial abnormality. Specifically, no hemorrhage, hydrocephalus, mass lesion, acute infarction, or significant intracranial injury. No acute calvarial abnormality. Visualized paranasal sinuses and mastoids clear. Orbital soft tissues unremarkable.  IMPRESSION: No acute intracranial abnormality.  Atrophy, chronic microvascular disease.   Electronically Signed   By: Rolm Baptise M.D.   On: 02/25/2015 18:11   Carotid Doppler  Bilateral: 1-39% ICA stenosis. Right: Vertebral artery appears retrograde. Left: Vertebral artery flow is antegrade.    PHYSICAL EXAM Pleasant elderly  Caucasian lady not in distress. . Afebrile. Head is nontraumatic. Neck is supple without bruit.    Cardiac exam no murmur or gallop. Lungs are clear to auscultation. Distal pulses are well felt. Neurological Exam ;  Awake  Alert oriented x 3. Normal speech and language.eye movements full without nystagmus.fundi were not visualized. Vision acuity and fields appear normal. Hearing is normal. Palatal movements are normal. Face symmetric. Tongue midline. Normal strength, tone, reflexes and coordination. Normal sensation. Gait deferred.ASSESSMENT/PLAN Yolanda Spears is a 80 y.o. female with history of atrial fibrillation on warfarin presenting with three episodes of right arm and hand weakness. She did not receive IV t-PA due to resolution of symptoms.   Stroke vs TIA, workup underway. Felt to be  embolic secondary to known atrial fibrillation   Resultant  Neuro deficits resolved  MRI  / MRA pacer  Carotid Doppler  Right vertebral artery retrograde  2D Echo  pending   LDL ordered  HgbA1c ordered  Warfarin for VTE prophylaxis Diet Heart Room service appropriate?: Yes; Fluid consistency:: Thin  warfarin prior to admission, now on warfarin  Dr. Leonie Man discussed at length recommendation to change to a NOAC. Patient is not interested because there is no blood work to check levels for the new drugs, and that gives her comfort with warfarin. For now, pt not agreeable to change, encouraged to discuss with her cardiologist.   Ongoing aggressive stroke risk factor management  Therapy recommendations:  Home health PT and OT  Disposition:  Discharge home with therapy  Chronic atrial fibrillation  INR 1.63 on admission  INR now 1.85  Dr. Leonie Man discussed at length recommendation to change to a NOAC. Patient is not interested because there is no blood work to check levels for the new drugs, and that gives her comfort with warfarin. For now, pt not agreeable to change, encouraged to discuss  with her cardiologist.   Hypertension  Stable  Hyperlipidemia  Home meds:  Zetia, resumed in hospital  LDL ordered, goal < 70  At statin if LDL greater than or equal to 70  Continue statin at discharge  Other Stroke Risk Factors  Advanced age  Former Cigarette smoker, quit smoking 16 years ago   Hx stroke/TIA - reported "small strokes and several TIAs"  Other Active Problems  Right shoulder pain, possible rotator cuff impingement  Chronic diarrhea  Neuropathy on Lyrica  Chronic diastolic CHF  Chronic kidney disease, baseline creatinine 1.4  Other Pertinent History  History breast cancer  Hospital day # [place] for Pager information 02/26/2015 5:08 PM  I have personally examined this patient, reviewed notes, independently viewed imaging studies, participated in medical decision making and plan of care. I have made any additions or clarifications directly to the  above note. Agree with note above. She presented with 3 transient episodes of right hand dysfunction likely left brain TIAs from AFIB with suboptimal anticoagulation on warfarin. She remains at risk for neurological worsening, recurrent TIAs and strokes.I discussed with her alternatives to warfarin, risk, benefits and recommend changing to eliquis but she appears to be fixated on wanting warfarin since we can check blood work to see if it is working.I explained to her that NOACs are superior to warfarin with less risk of bleeding but she wants to think this over and discuss with her cardiologist prior to making a decision to switch over.Continue  warfarin and ongoing stroke evaluation and aggressive risk factor modification.  Antony Contras, MD Medical Director [facility] Pager: [phone] 02/26/2015 6:19 PM    To contact Stroke Continuity provider, please refer to [email or web address] After hours, contact General Neurology

## 2015-02-26 NOTE — Progress Notes (Signed)
PROGRESS NOTE  LORRAINA Spears XBM:[id number] DOB: 02-May-1935 DOA: 02/25/2015 PCP: Thressa Sheller, MD  Assessment/Plan: Possible TIA:  recurrent episodic right arm and hand weakness and inability to control movement-- NO NUMBNESS  Subtherapeutic INR with history of A. Fib. -- change to eliquis if patient agreeable-- per neuro Head CT without acute abnormality. Patient unable to undergo MRI. Evaluated by neurology - A1c, fasting lipid panel, echo, carotid Dopplers, EEG (per Neurology) - PT/OT  R shoulder pain: possible rotator cuff impingement. Unlikely to be contributing to R arm dysfunction as mentioned above.  - PT - Voltaren Gel -CK/sed rate- doubt PMR but will check  Afib: chronic. Paced. Rate controlled. INR 1.88 - Coumadin- subtherapeutic - continue Rythmol, bblocker  Chronic diarrhea: Complex past medical GI history including small bowel obstructions, recent chronic C. difficile infection. No active infection the patient with chronic daily diarrhea - Lomotil when necessary  Neuropathy: - continue lyrica  Chronic Diastolid CHF: no acute exacerbation. Echo from 2015 showing EF 55-60% - continue lasix  HLD: - continue Zetia  CKD -baseline: 1,4   Code Status: full Family Communication: patient Disposition Plan: PT eval   Consultants:  neuro  Procedures:      HPI/Subjective: Had episode of weakness while brushing teeth  Objective: Filed Vitals:   02/26/15 0850  BP: 131/43  Pulse: 88  Temp: 97.9 F (36.6 C)  Resp: 18   No intake or output data in the 24 hours ending 02/26/15 1042 Filed Weights   02/25/15 2202  Weight: 74.571 kg (164 lb 6.4 oz)    Exam:   General:  A+Ox3, NAD  Cardiovascular: rrr  Respiratory: clear  Abdomen: +Bs, soft  Musculoskeletal: min edema   Data Reviewed: Basic Metabolic Panel:  Recent Labs Lab 02/25/15 1648 02/25/15 1656  NA 141 141  K 3.4* 3.5  CL 107 105  CO2 22  --   GLUCOSE 96 97  BUN 26* 31*   CREATININE 1.40* 1.50*  CALCIUM 8.6*  --    Liver Function Tests:  Recent Labs Lab 02/25/15 1648  AST 23  ALT 17  ALKPHOS 91  BILITOT 0.8  PROT 7.0  ALBUMIN 3.1*   No results for input(s): LIPASE, AMYLASE in the last 168 hours. No results for input(s): AMMONIA in the last 168 hours. CBC:  Recent Labs Lab 02/25/15 1648 02/25/15 1656  WBC 7.0  --   NEUTROABS 4.8  --   HGB 10.1* 10.9*  HCT 31.9* 32.0*  MCV 90.6  --   PLT 198  --    Cardiac Enzymes: No results for input(s): CKTOTAL, CKMB, CKMBINDEX, TROPONINI in the last 168 hours. BNP (last 3 results) No results for input(s): BNP in the last 8760 hours.  ProBNP (last 3 results) No results for input(s): PROBNP in the last 8760 hours.  CBG:  Recent Labs Lab 02/25/15 1639  GLUCAP 92    Recent Results (from the past 240 hour(s))  Clostridium Difficile by PCR (not at [facility])     Status: None   Collection Time: 02/26/15  6:41 AM  Result Value Ref Range Status   C difficile by pcr NEGATIVE NEGATIVE Final     Studies: Ct Head Wo Contrast  02/25/2015   CLINICAL DATA:  Code stroke. Intermittent right arm weakness since Saturday.  EXAM: CT HEAD WITHOUT CONTRAST  TECHNIQUE: Contiguous axial images were obtained from the base of the skull through the vertex without intravenous contrast.  COMPARISON:  03/21/2011  FINDINGS: There is atrophy and chronic small  vessel disease changes. No acute intracranial abnormality. Specifically, no hemorrhage, hydrocephalus, mass lesion, acute infarction, or significant intracranial injury. No acute calvarial abnormality. Visualized paranasal sinuses and mastoids clear. Orbital soft tissues unremarkable.  IMPRESSION: No acute intracranial abnormality.  Atrophy, chronic microvascular disease.   Electronically Signed   By: Rolm Baptise M.D.   On: 02/25/2015 18:11    Scheduled Meds: . calcium carbonate  1 tablet Oral Daily  . cholecalciferol  5,000 Units Oral Once per day on Sun Tue Thu Sat    . ezetimibe  5 mg Oral Daily  . feeding supplement (ENSURE ENLIVE)  237 mL Oral BID BM  . furosemide  40 mg Oral Daily  . hydrALAZINE  25 mg Oral TID  . lisinopril  10 mg Oral BID  . metoprolol succinate  100 mg Oral Daily  . potassium chloride SA  10 mEq Oral Daily  . pregabalin  75 mg Oral BID  . propafenone  225 mg Oral 3 times per day  . sodium chloride  3 mL Intravenous Q12H  . warfarin  2.5 mg Oral ONCE-1800  . Warfarin - Pharmacist Dosing Inpatient   Does not apply q1800   Continuous Infusions:  Antibiotics Given (last 72 hours)    None      Principal Problem:   TIA (transient ischemic attack) Active Problems:   Pacemaker- Dual-chamber-mdt   Partial small bowel obstruction - recurrent - s/p lysis of adhesions 12/28/2013   Chronic diarrhea   HLD (hyperlipidemia)   Neuropathy   Right shoulder pain    Time spent: 25 min    Melik Blancett  [facility] Pager [phone]. If 7PM-7AM, please contact night-coverage at [email or web address], password [facility] 02/26/2015, 10:42 AM  LOS: 1 day

## 2015-02-26 NOTE — Progress Notes (Signed)
Prichard for warfarin Indication: atrial fibrillation  Allergies  Allergen Reactions  . Other Hives    Contrast dye   . Penicillins Itching  . Shellfish-Derived Products Nausea And Vomiting  . Statins     Myalgias,elevated LFT studies  . Iohexol Hives and Itching    Itching in eyes, one hive on face.    Patient Measurements: Height: 5\' 7"  (170.2 cm) Weight: 164 lb 6.4 oz (74.571 kg) IBW/kg (Calculated) : 61.6 Heparin Dosing Weight:   Vital Signs: Temp: 98.2 F (36.8 C) (06/21 0600) Temp Source: Oral (06/21 0600) BP: 142/48 mmHg (06/21 0600) Pulse Rate: 70 (06/21 0600)  Labs:  Recent Labs  02/25/15 1648 02/25/15 1656 02/26/15 0626  HGB 10.1* 10.9*  --   HCT 31.9* 32.0*  --   PLT 198  --   --   APTT 37  --   --   LABPROT 19.4*  --  21.3*  INR 1.63*  --  1.85*  CREATININE 1.40* 1.50*  --     Estimated Creatinine Clearance: 32.6 mL/min (by C-G formula based on Cr of 1.5).   Medical History: Past Medical History  Diagnosis Date  . Pacemaker -[employer]tr[employer]   . Hypertension   . Peripheral neuropathy   . High cholesterol   . Heart murmur   . Shortness of breath on exertion   . Atrial fibrillation   . History of radiation therapy   . Partial bowel obstruction JAN/2013    recurrent  . Colon polyp 2011  . Osteopenia 02/2013    T score -1.4 FRAX 11%/2.1%  . Stroke     "small stroke & several TIA's occasional visual problems / and occasional speach problems  . Arthritis   . Personal history of skin cancer   . Urinary leakage   . Hx: UTI (urinary tract infection)   . Sleep apnea     unaable to tolerate mask   . Endometrial cancer 2006    Stage IIIc  s/p radiation  . Breast cancer 1999    LOBULAR CARCINOMA IN SITU...  . Ringing in ears   . Orthostatic lightheadedness 11/27/2011  . Small bowel obstruction due to adhesions 12/28/2013  . Hx of echocardiogram     Echo (02/2014):  EF 55-60%, no RWMA, mild AI, mild MR, mild  LAE, PASP 44 mmHg  . Tubular adenoma of colon   . Internal hemorrhoids   . Recurrent Clostridium difficile diarrhea     Assessment: 80 yo female presents with R arm numbness.  Concern for TIA, on warfarin for afib but INR subtherapeutic at presentation.  CT head reveals no acute abnormality. Last warfarin dose 6/19, INR 1.6 > 1.85.  Home dose: 2.5 mg qWed, 1.25 mg all other days   Goal of Therapy:  INR 2-3  Plan:   Warfarin 2.5mg  po x1  Daily INR  Monitor for bleeding    Hughes Better, PharmD, BCPS Clinical Pharmacist Pager: [phone] 02/26/2015 8:41 AM

## 2015-02-26 NOTE — Evaluation (Signed)
Physical Therapy Evaluation Patient Details Name: Yolanda Spears MRN: [id number] DOB: 1935/05/14 Today's Date: 02/26/2015   History of Present Illness  80 yo female admitted with R arm and hand numbness. First episode started on 02/23/15. Pt reports she "had no control over my arm", lasting a few minutes. PMH: pacemaker, peripheral neuropathy, A fib, SOB on exertion, previous TIA's  Clinical Impression  Patient demonstrates deficits in functional mobility as indicated below. Will need continued skilled PT to address deficits and maximize function. Will see as indicated and progress as tolerated. Recommend HHPT upon discharge.    Follow Up Recommendations Home health PT;Supervision/Assistance - 24 hour (initially)    Equipment Recommendations  None recommended by PT    Recommendations for Other Services       Precautions / Restrictions Precautions Precautions: Fall Restrictions Weight Bearing Restrictions: No      Mobility  Bed Mobility Overal bed mobility: Needs Assistance Bed Mobility: Supine to Sit;Sit to Supine     Supine to sit: Min guard Sit to supine: Supervision   General bed mobility comments: increased time to perform with use of rail, no physical assist required  Transfers Overall transfer level: Needs assistance Equipment used: Rolling walker (2 wheeled);None Transfers: Sit to/from Stand Sit to Stand: Min guard         General transfer comment: performed with and without device  Ambulation/Gait Ambulation/Gait assistance: Supervision;Min guard Ambulation Distance (Feet): 120 Feet (35 ft in roo without device (min guard) 120 ft with RW) Assistive device: Rolling walker (2 wheeled) Gait Pattern/deviations: Step-through pattern;Decreased stride length;Trunk flexed;Narrow base of support;Drifts right/left Gait velocity: decreased and guarded Gait velocity interpretation: Below normal speed for age/gender General Gait Details: patient with some modest  instability noted with ambulation, no significant LOB noted. Min guard to supervision provided. patient tolerated well but reports difficulty steering RW stating that her rollator is much easier to manuever. Patient demonstrated ambulation in room utilizing wall for stability. no physical assist required.  Stairs            Wheelchair Mobility    Modified Rankin (Stroke Patients Only)       Balance Overall balance assessment: Needs assistance   Sitting balance-Leahy Scale: Good     Standing balance support: During functional activity Standing balance-Leahy Scale: Fair                               Pertinent Vitals/Pain Pain Assessment: Faces Faces Pain Scale: Hurts little more Pain Location: RUE Pain Descriptors / Indicators: Aching Pain Intervention(s): Monitored during session;Repositioned    Home Living Family/patient expects to be discharged to:: Private residence Living Arrangements: Alone Available Help at Discharge: Family;Available PRN/intermittently (Sister) Type of Home: Apartment (condo) Home Access: Level entry     Home Layout: One level Home Equipment: Walker - 4 wheels;Cane - single point;Shower seat;Grab bars - toilet;Grab bars - tub/shower;Hand held shower head;Adaptive equipment      Prior Function Level of Independence: Independent with assistive device(s)         Comments: patient states that in the house she walls and furniture surfs and in the community she uses cane vs rollator vs w/c depending on how she feels     Hand Dominance   Dominant Hand: Right    Extremity/Trunk Assessment   Upper Extremity Assessment: Generalized weakness           Lower Extremity Assessment: Generalized weakness  Communication   Communication: No difficulties  Cognition Arousal/Alertness: Awake/alert Behavior During Therapy: WFL for tasks assessed/performed Overall Cognitive Status: Within Functional Limits for tasks  assessed                      General Comments      Exercises        Assessment/Plan    PT Assessment Patient needs continued PT services  PT Diagnosis Difficulty walking;Generalized weakness   PT Problem List Decreased strength;Decreased activity tolerance;Decreased balance;Decreased mobility;Decreased coordination  PT Treatment Interventions DME instruction;Gait training;Stair training;Functional mobility training;Therapeutic activities;Therapeutic exercise;Balance training;Patient/family education   PT Goals (Current goals can be found in the Care Plan section) Acute Rehab PT Goals Patient Stated Goal: to get better PT Goal Formulation: With patient Time For Goal Achievement: 03/12/15 Potential to Achieve Goals: Good    Frequency Min 4X/week   Barriers to discharge Decreased caregiver support patient states that she can arrange initial supervision and that she does have visiting angels that she can arrange to assist if needed    Co-evaluation               End of Session Equipment Utilized During Treatment: Gait belt Activity Tolerance: Patient tolerated treatment well;No increased pain Patient left: in bed;with call bell/phone within reach;with bed alarm set Nurse Communication: Mobility status         Time: 1040-1101 PT Time Calculation (min) (ACUTE ONLY): 21 min   Charges:   PT Evaluation $Initial PT Evaluation Tier I: 1 Procedure     PT G CodesDuncan Dull 03-25-15, 3:50 PM Alben Deeds, [place] DPT  [phone]

## 2015-02-26 NOTE — Progress Notes (Signed)
VASCULAR LAB PRELIMINARY  PRELIMINARY  PRELIMINARY  PRELIMINARY  Carotid duplex  completed.    Preliminary report:  Bilateral:  1-39% ICA stenosis.  Right:  Vertebral artery appears retrograde.  Left: Vertebral artery flow is antegrade.    Johntae Broxterman, RVT 02/26/2015, 12:38 PM

## 2015-02-27 DIAGNOSIS — I482 Chronic atrial fibrillation: Secondary | ICD-10-CM

## 2015-02-27 DIAGNOSIS — Z950 Presence of cardiac pacemaker: Secondary | ICD-10-CM

## 2015-02-27 DIAGNOSIS — G459 Transient cerebral ischemic attack, unspecified: Principal | ICD-10-CM

## 2015-02-27 DIAGNOSIS — E785 Hyperlipidemia, unspecified: Secondary | ICD-10-CM

## 2015-02-27 LAB — LIPID PANEL
Cholesterol: 117 mg/dL (ref 0–200)
HDL: 34 mg/dL — AB (ref >40)
LDL Cholesterol: 66 mg/dL (ref 0–99)
Total CHOL/HDL Ratio: 3.4 RATIO
Triglycerides: 85 mg/dL (ref <150)
VLDL: 17 mg/dL (ref 0–40)

## 2015-02-27 LAB — BASIC METABOLIC PANEL
Anion gap: 8 (ref 5–15)
BUN: 20 mg/dL (ref 6–20)
CALCIUM: 8.9 mg/dL (ref 8.9–10.3)
CO2: 27 mmol/L (ref 22–32)
Chloride: 106 mmol/L (ref 101–111)
Creatinine, Ser: 1.47 mg/dL — ABNORMAL HIGH (ref 0.44–1.00)
GFR calc Af Amer: 38 mL/min — ABNORMAL LOW (ref >60)
GFR calc non Af Amer: 33 mL/min — ABNORMAL LOW (ref >60)
GLUCOSE: 93 mg/dL (ref 65–99)
Potassium: 3.4 mmol/L — ABNORMAL LOW (ref 3.5–5.1)
Sodium: 141 mmol/L (ref 135–145)

## 2015-02-27 LAB — CBC
HCT: 29.5 % — ABNORMAL LOW (ref 36.0–46.0)
HEMOGLOBIN: 9.5 g/dL — AB (ref 12.0–15.0)
MCH: 29.3 pg (ref 26.0–34.0)
MCHC: 32.2 g/dL (ref 30.0–36.0)
MCV: 91 fL (ref 78.0–100.0)
PLATELETS: 172 10*3/uL (ref 150–400)
RBC: 3.24 MIL/uL — AB (ref 3.87–5.11)
RDW: 14 % (ref 11.5–15.5)
WBC: 5.6 10*3/uL (ref 4.0–10.5)

## 2015-02-27 LAB — PROTIME-INR
INR: 2.82 — ABNORMAL HIGH (ref 0.00–1.49)
PROTHROMBIN TIME: 29.2 s — AB (ref 11.6–15.2)

## 2015-02-27 MED ORDER — WARFARIN 1.25 MG HALF TABLET
1.2500 mg | ORAL_TABLET | Freq: Once | ORAL | Status: DC
  Filled 2015-02-27: qty 1

## 2015-02-27 MED ORDER — WARFARIN SODIUM 2.5 MG PO TABS: ORAL_TABLET | ORAL | Status: DC

## 2015-02-27 NOTE — Discharge Summary (Signed)
Physician Discharge Summary  Yolanda Spears SAY:[id number] DOB: 30-Sep-1934 DOA: 02/25/2015  PCP: Thressa Sheller, MD  Admit date: 02/25/2015 Discharge date: 02/27/2015  Recommendations for Outpatient Follow-up:  1. Pt will need to follow up with PCP in 2 weeks post discharge 2. Please obtain INR on 03/01/15 and adjust coumadin level accordingly  Discharge Diagnoses:   Stroke vs TIA, workup underway. Felt to be embolic secondary to known atrial fibrillation   Neuro deficits resolved  MRI / MRA--cannot obtain due to PPM  Carotid Doppler--bilateral 1-39% stenosis; Right vertebral artery retrograde  2D Echo--EF 23-55%, grade 2 diastolic dysfunction, bicuspid aortic valve  LDL--66  HgbA1c--pending  Warfarin for VTE prophylaxis  Diet Heart Room service appropriate?: Yes; Fluid consistency:: Thin  warfarin prior to admission, now on warfarin  Dr. Leonie Man discussed at length recommendation to change to a NOAC. Patient is not interested because there is no blood work to check levels for the new drugs, and that gives her comfort with warfarin. For now, pt not agreeable to change, encouraged to discuss with her cardiologist.   Ongoing aggressive stroke risk factor management  Therapy recommendations: Home health PT and OT  Chronic atrial fibrillation  INR 1.63 on admission--dosing was adjusted with the assistance of PharmD  INR 2.82 on day of d/c  Patient will be discharged with her Coumadin 1.25 mg daily except for Wednesdays  Dr. Leonie Man discussed at length recommendation to change to a NOAC. Patient is not interested because there is no blood work to check levels for the new drugs, and that gives her comfort with warfarin. For now, pt not agreeable to change, encouraged to discuss with her cardiologist.  Continue Rythmol  The patient was instructed to restart her usual home dose of Coumadin, but was instructed to take only 1.25 mg today rather than her usual 2.5 mg on  Wednesday. She was instructed to restart her 2.5 mg Wednesday dosing on 03/06/2015  R shoulder pain:  -possible rotator cuff impingement. Unlikely to be contributing to R arm dysfunction as mentioned above.  - PT - Voltaren Gel -CK 61  Chronic diarrhea:  -Complex past medical GI history including small bowel obstructions, recent chronic C. difficile infection. No active infection the patient with chronic daily diarrhea - Lomotil when necessary - 01/21/2015 patient had positive antigliadin IgG and tissue transglutaminase antibodies -Follow-up with outpatient gastroenterologist at [place]  Neuropathy: - continue lyrica  Chronic Diastolic CHF:  -no acute exacerbation.  -Echo-EF 55-60 percent, grade 2 diastolic dysfunction - continue lasix and metoprolol succinate  HLD: - continue Zetia  CKD3 -baseline creatinine 1.1-1.4  Discharge Condition: stable  Disposition: home  Diet:heart healthy Wt Readings from Last 3 Encounters:  02/25/15 74.571 kg (164 lb 6.4 oz)  01/21/15 74.844 kg (165 lb)  01/18/15 76.567 kg (168 lb 12.8 oz)    History of present illness:  80 y.o. female who reports that on 6/18 she had an episode when her "right arm and hand felt as if it did not belong to her". This lasted for a few minutes and resolved. Then she had another episode on 6/19 that lasted a little longer. Today 02/25/2015 when she was at the Urologist she noted that she was unable to handle the zipper on her purse with her right hand (LKW time undetermined).  INR last checked on 6/16 and was low at 1.8. Patient was not administered TPA secondary to Resolution of symptoms. She was admitted for further evaluation and treatment.   Consultants: Neurology  Discharge Exam:  Filed Vitals:   02/27/15 0916  BP: 116/40  Pulse: 62  Temp: 97.6 F (36.4 C)  Resp: 20   Filed Vitals:   02/26/15 2120 02/27/15 0211 02/27/15 0502 02/27/15 0916  BP: 149/50 112/34 124/35 116/40  Pulse: 82 62 61 62    Temp: 98.4 F (36.9 C) 98.6 F (37 C) 97.9 F (36.6 C) 97.6 F (36.4 C)  TempSrc: Oral Oral Oral Oral  Resp: 18 20 18 20   Height:      Weight:      SpO2: 95% 94% 95% 97%   General: A&O x 3, NAD, pleasant, cooperative Cardiovascular: RRR, no rub, no gallop, no S3 Respiratory: CTAB, no wheeze, no rhonchi Abdomen:soft, nontender, nondistended, positive bowel sounds Extremities: No edema, No lymphangitis, no petechiae  Discharge Instructions      Discharge Instructions    Diet - low sodium heart healthy    Complete by:  As directed      Discharge instructions    Complete by:  As directed   Take coumadin 1.25 mg today, NOT 2.5 mg Call for appointment to get your coumadin level checked on 03/01/15     Increase activity slowly    Complete by:  As directed             Medication List    TAKE these medications        calcium carbonate 500 MG chewable tablet  Commonly known as:  TUMS - dosed in mg elemental calcium  Chew 1 tablet by mouth daily.     furosemide 40 MG tablet  Commonly known as:  LASIX  TAKE 1 TABLET BY MOUTH EVERY DAY     hydrALAZINE 25 MG tablet  Commonly known as:  APRESOLINE  TAKE 1 TABLET BY MOUTH THREE TIMES A DAY     lisinopril 10 MG tablet  Commonly known as:  PRINIVIL,ZESTRIL  Take 1 tablet (10 mg total) by mouth 2 (two) times daily.     metoprolol succinate 100 MG 24 hr tablet  Commonly known as:  TOPROL-XL  TAKE 1 TABLET BY MOUTH EVERY DAY WITH OR IMMEDIATELY FOLLOWING A MEAL     potassium chloride 10 MEQ tablet  Commonly known as:  K-DUR,KLOR-CON  Take 1 tablet (10 mEq total) by mouth daily.     pregabalin 75 MG capsule  Commonly known as:  LYRICA  Take 75 mg by mouth 2 (two) times daily.     propafenone 225 MG tablet  Commonly known as:  RYTHMOL  TAKE 1 TABLET (225 MG TOTAL) BY MOUTH EVERY 8 (EIGHT) HOURS.     Vitamin D-3 5000 UNITS Tabs  Take 1 tablet by mouth 4 (four) times a week. Takes on Saturday, Sunday, Tuesday and  Thursday     warfarin 2.5 MG tablet  Commonly known as:  COUMADIN  TAKE 1/2 TABLET BY MOUTH EVERY DAY Except on Wednesdays take a whole tablet= 2.5mg  Start 2.5mg  on 03/06/15     ZETIA 10 MG tablet  Generic drug:  ezetimibe  TAKE 1/2 TABLET BY MOUTH EVERY DAY         The results of significant diagnostics from this hospitalization (including imaging, microbiology, ancillary and laboratory) are listed below for reference.    Significant Diagnostic Studies: Ct Head Wo Contrast  02/25/2015   CLINICAL DATA:  Code stroke. Intermittent right arm weakness since Saturday.  EXAM: CT HEAD WITHOUT CONTRAST  TECHNIQUE: Contiguous axial images were obtained from the base of the skull through the vertex  without intravenous contrast.  COMPARISON:  03/21/2011  FINDINGS: There is atrophy and chronic small vessel disease changes. No acute intracranial abnormality. Specifically, no hemorrhage, hydrocephalus, mass lesion, acute infarction, or significant intracranial injury. No acute calvarial abnormality. Visualized paranasal sinuses and mastoids clear. Orbital soft tissues unremarkable.  IMPRESSION: No acute intracranial abnormality.  Atrophy, chronic microvascular disease.   Electronically Signed   By: Rolm Baptise M.D.   On: 02/25/2015 18:11     Microbiology: Recent Results (from the past 240 hour(s))  Clostridium Difficile by PCR (not at [facility])     Status: None   Collection Time: 02/26/15  6:41 AM  Result Value Ref Range Status   C difficile by pcr NEGATIVE NEGATIVE Final     Labs: Basic Metabolic Panel:  Recent Labs Lab 02/25/15 1648 02/25/15 1656 02/27/15 0715  NA 141 141 141  K 3.4* 3.5 3.4*  CL 107 105 106  CO2 22  --  27  GLUCOSE 96 97 93  BUN 26* 31* 20  CREATININE 1.40* 1.50* 1.47*  CALCIUM 8.6*  --  8.9   Liver Function Tests:  Recent Labs Lab 02/25/15 1648  AST 23  ALT 17  ALKPHOS 91  BILITOT 0.8  PROT 7.0  ALBUMIN 3.1*   No results for input(s): LIPASE, AMYLASE in  the last 168 hours. No results for input(s): AMMONIA in the last 168 hours. CBC:  Recent Labs Lab 02/25/15 1648 02/25/15 1656 02/27/15 0715  WBC 7.0  --  5.6  NEUTROABS 4.8  --   --   HGB 10.1* 10.9* 9.5*  HCT 31.9* 32.0* 29.5*  MCV 90.6  --  91.0  PLT 198  --  172   Cardiac Enzymes:  Recent Labs Lab 02/26/15 1130  CKTOTAL 61   BNP: Invalid input(s): POCBNP CBG:  Recent Labs Lab 02/25/15 1639  GLUCAP 92    Time coordinating discharge:  Greater than 30 minutes  Signed:  Josephanthony Tindel, DO [facility] Pager: [phone] 02/27/2015, 10:27 AM

## 2015-02-27 NOTE — Care Management Note (Signed)
Case Management Note  Patient Details  Name: Yolanda Spears MRN: [id number] Date of Birth: 10-Oct-1934  Subjective/Objective:                    Action/Plan: Met with patient to discuss discharge planning. Patient is agreeable to home health and has chosen [facility], which she has used in the past.  [name] with [facility] was notified of the new referral for discharge home today. Address and phone number were verified in the system  Expected Discharge Date:   (unknown)               Expected Discharge Plan:  [place] (Lives home alone. Uses [facility] [employer] private duty care agency)  In-House Referral:     Discharge planning Services  CM Consult  Post Acute Care Choice:    Choice offered to:  Patient  DME Arranged:    DME Agency:     HH Arranged:  PT, OT HH Agency:  [facility]  Status of Service:  Completed, signed off  Medicare Important Message Given:  N/A - LOS <3 / Initial given by admissions Date Medicare IM Given:    Medicare IM give by:    Date Additional Medicare IM Given:    Additional Medicare Important Message give by:     If discussed at [place] of Stay Meetings, dates discussed:    Additional Comments:  Rolm Baptise, RN 02/27/2015, 10:43 AM

## 2015-02-27 NOTE — Progress Notes (Signed)
Discharge order received. Alert and oriented. VSS. Family (sister) at bedside. Discharge instructions provided and pt verbalize understanding. Discharge to home. Transported via wheelchair with sister.  Docia Barrier, Therapist, sports.

## 2015-02-27 NOTE — Progress Notes (Signed)
Occupational Therapy Treatment Patient Details Name: Yolanda Spears MRN: [id number] DOB: 26-Jul-1935 Today's Date: 02/27/2015    History of present illness 80 yo female admitted with R arm and hand numbness. First episode started on 02/23/15. Pt reports she "had no control over my arm", lasting a few minutes. PMH: pacemaker, peripheral neuropathy, A fib, SOB on exertion, previous TIA's   OT comments  Pt reports no new episodes of R UE weakeness/ numbness today. Pt able to complete grooming tasks standing at sink without report of R UE weakness. Pt reports R shoulder discomfort or "pulling" sensation upon shoulder flexion past 90 degree; no discomfort in other planes, able to perform full ROM bil UE. Pt reports having home aide assist with housework, transportation, medication and grocery shopping; sister available PRN to A upon d/c. Pt would benefit from [facility] for increased safety and independence with ADLs.   Follow Up Recommendations  Home health OT;Supervision - Intermittent    Equipment Recommendations  None recommended by OT    Recommendations for Other Services      Precautions / Restrictions Precautions Precautions: Fall Restrictions Weight Bearing Restrictions: No       Mobility Bed Mobility Overal bed mobility: Modified Independent Bed Mobility: Supine to Sit;Sit to Supine     Supine to sit: Modified independent (Device/Increase time) Sit to supine: Modified independent (Device/Increase time)   General bed mobility comments: bed flat, no rails, no physical assist  Transfers Overall transfer level: Needs assistance Equipment used: Rolling walker (2 wheeled);None Transfers: Sit to/from Stand Sit to Stand: Supervision              Balance Overall balance assessment: Needs assistance Sitting-balance support: Feet supported;Single extremity supported       Standing balance support: Single extremity supported;During functional activity                        ADL Overall ADL's : Needs assistance/impaired Eating/Feeding: Independent;Sitting   Grooming: Wash/dry hands;Wash/dry face;Oral care;Applying deodorant;Brushing hair;Supervision/safety;Standing           Upper Body Dressing : Supervision/safety;Standing   Lower Body Dressing: Supervision/safety;Sit to/from stand   Toilet Transfer: Supervision/safety;Ambulation;RW   Toileting- Clothing Manipulation and Hygiene: Modified independent;Sit to/from stand       Functional mobility during ADLs: Supervision/safety;Rolling walker General ADL Comments: no UE weakness reported during grooming activities      Vision                     Perception     Praxis      Cognition   Behavior During Therapy: WFL for tasks assessed/performed Overall Cognitive Status: Within Functional Limits for tasks assessed                       Extremity/Trunk Assessment               Exercises     Shoulder Instructions       General Comments      Pertinent Vitals/ Pain       Pain Assessment: No/denies pain  Home Living                                          Prior Functioning/Environment              Frequency Min 2X/week  Progress Toward Goals  OT Goals(current goals can now be found in the care plan section)  Progress towards OT goals: Progressing toward goals  Acute Rehab OT Goals Patient Stated Goal: to get better OT Goal Formulation: With patient Time For Goal Achievement: 03/12/15 Potential to Achieve Goals: Good ADL Goals Pt Will Perform Grooming: with supervision;standing Pt Will Perform Lower Body Dressing: with supervision;sit to/from stand Pt Will Transfer to Toilet: with supervision;ambulating;regular height toilet Pt Will Perform Tub/Shower Transfer: Shower transfer;with supervision;ambulating;rolling walker  Plan Discharge plan remains appropriate    Co-evaluation                 End of Session  Equipment Utilized During Treatment: Gait belt;Rolling walker   Activity Tolerance Patient tolerated treatment well   Patient Left in bed;with call bell/phone within reach;with bed alarm set   Nurse Communication Mobility status        Time: 1275-1700 OT Time Calculation (min): 25 min  Charges: OT General Charges $OT Visit: 1 Procedure OT Treatments $Self Care/Home Management : 23-37 mins  Forest Gleason 02/27/2015, 10:37 AM

## 2015-02-27 NOTE — Progress Notes (Signed)
[place] for warfarin Indication: atrial fibrillation  Allergies  Allergen Reactions  . Other Hives    Contrast dye   . Penicillins Itching  . Shellfish-Derived Products Nausea And Vomiting  . Statins     Myalgias,elevated LFT studies  . Iohexol Hives and Itching    Itching in eyes, one hive on face.    Patient Measurements: Height: 5\' 7"  (170.2 cm) Weight: 164 lb 6.4 oz (74.571 kg) IBW/kg (Calculated) : 61.6 Heparin Dosing Weight:   Vital Signs: Temp: 97.9 F (36.6 C) (06/22 0502) Temp Source: Oral (06/22 0502) BP: 124/35 mmHg (06/22 0502) Pulse Rate: 61 (06/22 0502)  Labs:  Recent Labs  02/25/15 1648 02/25/15 1656 02/26/15 0626 02/26/15 1130 02/27/15 0715  HGB 10.1* 10.9*  --   --  9.5*  HCT 31.9* 32.0*  --   --  29.5*  PLT 198  --   --   --  172  APTT 37  --   --   --   --   LABPROT 19.4*  --  21.3*  --  29.2*  INR 1.63*  --  1.85*  --  2.82*  CREATININE 1.40* 1.50*  --   --   --   CKTOTAL  --   --   --  61  --     Estimated Creatinine Clearance: 32.6 mL/min (by C-G formula based on Cr of 1.5).   Medical History: Past Medical History  Diagnosis Date  . Pacemaker -[employer]   . Hypertension   . Peripheral neuropathy   . High cholesterol   . Heart murmur   . Shortness of breath on exertion   . Atrial fibrillation   . History of radiation therapy   . Partial bowel obstruction JAN/2013    recurrent  . Colon polyp 2011  . Osteopenia 02/2013    T score -1.4 FRAX 11%/2.1%  . Stroke     "small stroke & several TIA's occasional visual problems / and occasional speach problems  . Arthritis   . Personal history of skin cancer   . Urinary leakage   . Hx: UTI (urinary tract infection)   . Sleep apnea     unaable to tolerate mask   . Endometrial cancer 2006    Stage IIIc  s/p radiation  . Breast cancer 1999    LOBULAR CARCINOMA IN SITU...  . Ringing in ears   . Orthostatic lightheadedness 11/27/2011  . Small  bowel obstruction due to adhesions 12/28/2013  . Hx of echocardiogram     Echo (02/2014):  EF 55-60%, no RWMA, mild AI, mild MR, mild LAE, PASP 44 mmHg  . Tubular adenoma of colon   . Internal hemorrhoids   . Recurrent Clostridium difficile diarrhea     Assessment: 80 yo female presents with R arm numbness.  Concern for TIA, on warfarin for afib but INR subtherapeutic at presentation.  CT head reveals no acute abnormality. Last warfarin dose 6/19, INR 1.6 > 1.8 > 2.8. Large jump in INR, no s/sx bleeding.  Home dose: 2.5 mg qWed, 1.25 mg all other days   Goal of Therapy:  INR 2-3  Plan:   Warfarin 1.25 mg po x1  Daily INR  Monitor for bleeding    Hughes Better, PharmD, BCPS Clinical Pharmacist Pager: [phone] 02/27/2015 8:49 AM

## 2015-02-27 NOTE — Progress Notes (Signed)
STROKE TEAM PROGRESS NOTE   HISTORY Yolanda Spears is an 80 y.o. female who reports that on 6/18 she had an episode when her "right arm and hand felt as if it did not belong to her". This lasted for a few minutes and resolved. Then she had another episode on 6/19 that lasted a little longer. Today 02/25/2015 when she was at the Urologist she noted that she was unable to handle the zipper on her purse with her right hand (LKW time undetermined). It is unclear to her when she lost functional use of her right hand since she is not clear when she last tried to use it. Her sister encouraged her to seek medical attention. Symptoms have now resolved. Patient has a history of atrial fibrillation on Coumadin. INR last checked on 6/16 and was low at 1.8. Patient was not administered TPA secondary to Resolution of symptoms. She was admitted for further evaluation and treatment.   SUBJECTIVE (INTERVAL HISTORY) No family at bedside.   OBJECTIVE Temp:  [97.6 F (36.4 C)-98.6 F (37 C)] 97.6 F (36.4 C) (06/22 0916) Pulse Rate:  [61-82] 62 (06/22 0916) Cardiac Rhythm:  [-] A-V Sequential paced (06/22 0405) Resp:  [18-20] 20 (06/22 0916) BP: (112-156)/(34-57) 116/40 mmHg (06/22 0916) SpO2:  [94 %-98 %] 97 % (06/22 0916)   Recent Labs Lab 02/25/15 1639  GLUCAP 92    Recent Labs Lab 02/25/15 1648 02/25/15 1656 02/27/15 0715  NA 141 141 141  K 3.4* 3.5 3.4*  CL 107 105 106  CO2 22  --  27  GLUCOSE 96 97 93  BUN 26* 31* 20  CREATININE 1.40* 1.50* 1.47*  CALCIUM 8.6*  --  8.9    Recent Labs Lab 02/25/15 1648  AST 23  ALT 17  ALKPHOS 91  BILITOT 0.8  PROT 7.0  ALBUMIN 3.1*    Recent Labs Lab 02/25/15 1648 02/25/15 1656 02/27/15 0715  WBC 7.0  --  5.6  NEUTROABS 4.8  --   --   HGB 10.1* 10.9* 9.5*  HCT 31.9* 32.0* 29.5*  MCV 90.6  --  91.0  PLT 198  --  172    Recent Labs Lab 02/26/15 1130  CKTOTAL 61    Recent Labs  02/25/15 1648 02/26/15 0626  02/27/15 0715  LABPROT 19.4* 21.3* 29.2*  INR 1.63* 1.85* 2.82*    Recent Labs  02/25/15 1850  COLORURINE YELLOW  LABSPEC 1.006  PHURINE 5.0  GLUCOSEU NEGATIVE  HGBUR NEGATIVE  BILIRUBINUR NEGATIVE  KETONESUR NEGATIVE  PROTEINUR NEGATIVE  UROBILINOGEN 0.2  NITRITE NEGATIVE  LEUKOCYTESUR LARGE*       Component Value Date/Time   CHOL 117 02/27/2015 0715   TRIG 85 02/27/2015 0715   HDL 34* 02/27/2015 0715   CHOLHDL 3.4 02/27/2015 0715   VLDL 17 02/27/2015 0715   LDLCALC 66 02/27/2015 0715   Lab Results  Component Value Date   HGBA1C 5.7* 03/19/2011      Component Value Date/Time   LABOPIA NONE DETECTED 02/25/2015 1620   COCAINSCRNUR NONE DETECTED 02/25/2015 1620   LABBENZ NONE DETECTED 02/25/2015 1620   AMPHETMU NONE DETECTED 02/25/2015 1620   THCU NONE DETECTED 02/25/2015 1620   LABBARB NONE DETECTED 02/25/2015 1620     Recent Labs Lab 02/25/15 1648  ETH <5    Ct Head Wo Contrast  02/25/2015   CLINICAL DATA:  Code stroke. Intermittent right arm weakness since Saturday.  EXAM: CT HEAD WITHOUT CONTRAST  TECHNIQUE: Contiguous axial images were obtained  from the base of the skull through the vertex without intravenous contrast.  COMPARISON:  03/21/2011  FINDINGS: There is atrophy and chronic small vessel disease changes. No acute intracranial abnormality. Specifically, no hemorrhage, hydrocephalus, mass lesion, acute infarction, or significant intracranial injury. No acute calvarial abnormality. Visualized paranasal sinuses and mastoids clear. Orbital soft tissues unremarkable.  IMPRESSION: No acute intracranial abnormality.  Atrophy, chronic microvascular disease.   Electronically Signed   By: Rolm Baptise M.D.   On: 02/25/2015 18:11   Carotid Doppler  Bilateral: 1-39% ICA stenosis. Right: Vertebral artery appears retrograde. Left: Vertebral artery flow is antegrade.   2D Echocardiogram  - Normal LV size and systolic function, EF 11-57%. Septal bounce from RV  pacing. Normal RV size and systolic function. Mild biatrial enlargement. Bicuspid aortic valve with mild stenosis. Mild mitral regurgitation.   PHYSICAL EXAM Pleasant elderly Caucasian lady not in distress. . Afebrile. Head is nontraumatic. Neck is supple without bruit.    Cardiac exam no murmur or gallop. Lungs are clear to auscultation. Distal pulses are well felt. Neurological Exam ;  Awake  Alert oriented x 3. Normal speech and language.eye movements full without nystagmus.fundi were not visualized. Vision acuity and fields appear normal. Hearing is normal. Palatal movements are normal. Face symmetric. Tongue midline. Normal strength, tone, reflexes and coordination. Normal sensation. Gait deferred.   ASSESSMENT/PLAN Ms. Yolanda Spears is a 80 y.o. female with history of atrial fibrillation on warfarin presenting with three episodes of right arm and hand weakness. She did not receive IV t-PA due to resolution of symptoms.   Stroke vs TIA, workup underway. Felt to be  embolic secondary to known atrial fibrillation   Resultant  Neuro deficits resolved  MRI  / MRA pacer  Carotid Doppler  Right vertebral artery retrograde. Compensated. Asymptomatic. No intervention indicated at this time.   2D Echo  No source of embolus   LDL 66  HgbA1c pending   Warfarin for VTE prophylaxis Diet Heart Room service appropriate?: Yes; Fluid consistency:: Thin Diet - low sodium heart healthy  warfarin prior to admission, now on warfarin  Dr. Leonie Man discussed at length recommendation to change to a NOAC. Patient is not interested because there is no blood work to check levels for the new drugs, and that gives her comfort with warfarin. For now, pt not agreeable to change, encouraged to discuss with her cardiologist.   Ongoing aggressive stroke risk factor management  Therapy recommendations:  Home health PT and OT  Disposition:  Discharge home with therapy  NOTHING FURTHER TO ADD FROM THE  STROKE STANDPOINT  Patient has a 10-15% risk of having another stroke over the next year, the highest risk is within 2 weeks of the most recent stroke/TIA (risk of having a stroke following a stroke or TIA is the same).  Ongoing risk factor control by Primary Care Physician  Stroke Service will sign off. Please call should any needs arise.  Follow-up Stroke Clinic at [facility] with Dr. Marcial Pacas in 2 months, order placed.  Chronic atrial fibrillation  INR 1.63 on admission  INR now 2.82  Dr. Leonie Man discussed at length recommendation to change to a NOAC. Patient is not interested because there is no blood work to check levels for the new drugs, and that gives her comfort with warfarin. For now, pt not agreeable to change, encouraged to discuss with her cardiologist. She will alert her cardiologist should she want to change.  Hypertension  Stable  Hyperlipidemia  Home meds:  Zetia, resumed in hospital  LDL  66, goal < 70  No indication for statin as LDL less than 70  Other Stroke Risk Factors  Advanced age  Former Cigarette smoker, quit smoking 16 years ago   Hx stroke/TIA - reported "small strokes and several TIAs"  Other Active Problems  Right shoulder pain, possible rotator cuff impingement  Chronic diarrhea  Neuropathy on Lyrica  Chronic diastolic CHF  Chronic kidney disease, baseline creatinine 1.4  Other Pertinent History  History breast cancer  Hospital day # 2  BIBY,SHARON  Dora for Pager information 02/27/2015 10:24 AM  I have personally examined this patient, reviewed notes, independently viewed imaging studies, participated in medical decision making and plan of care. I have made any additions or clarifications directly to the above note. Agree with note above.    Antony Contras, MD Medical Director [facility] Pager: [phone] 02/27/2015 4:30 PM    To contact Stroke Continuity  provider, please refer to [email or web address] After hours, contact General Neurology

## 2015-02-28 LAB — HEMOGLOBIN A1C
HEMOGLOBIN A1C: 5.4 % (ref 4.8–5.6)
MEAN PLASMA GLUCOSE: 108 mg/dL

## 2015-03-01 ENCOUNTER — Telehealth: Payer: Self-pay | Admitting: *Deleted

## 2015-03-01 ENCOUNTER — Ambulatory Visit (INDEPENDENT_AMBULATORY_CARE_PROVIDER_SITE_OTHER): Payer: Medicare Other | Admitting: *Deleted

## 2015-03-01 DIAGNOSIS — I482 Chronic atrial fibrillation, unspecified: Secondary | ICD-10-CM

## 2015-03-01 DIAGNOSIS — I4891 Unspecified atrial fibrillation: Secondary | ICD-10-CM

## 2015-03-01 DIAGNOSIS — Z5181 Encounter for therapeutic drug level monitoring: Secondary | ICD-10-CM | POA: Diagnosis not present

## 2015-03-01 LAB — POCT INR: INR: 2.6

## 2015-03-01 NOTE — Telephone Encounter (Signed)
Patient is out of the hospital and is calling for results.

## 2015-03-01 NOTE — Telephone Encounter (Signed)
Spoke with pt and she states the mornings are better than the evenings. States she would like to give it a little more time to see if the diarrhea gets better before trying the Viberzi. Pt will call back if she needs to.

## 2015-03-08 ENCOUNTER — Ambulatory Visit (INDEPENDENT_AMBULATORY_CARE_PROVIDER_SITE_OTHER): Payer: Medicare Other | Admitting: *Deleted

## 2015-03-08 DIAGNOSIS — Z5181 Encounter for therapeutic drug level monitoring: Secondary | ICD-10-CM | POA: Diagnosis not present

## 2015-03-08 DIAGNOSIS — I4891 Unspecified atrial fibrillation: Secondary | ICD-10-CM

## 2015-03-08 DIAGNOSIS — I482 Chronic atrial fibrillation, unspecified: Secondary | ICD-10-CM

## 2015-03-08 LAB — POCT INR: INR: 3.3

## 2015-03-19 ENCOUNTER — Other Ambulatory Visit: Payer: Self-pay | Admitting: Physician Assistant

## 2015-03-19 DIAGNOSIS — R531 Weakness: Secondary | ICD-10-CM | POA: Insufficient documentation

## 2015-03-21 ENCOUNTER — Ambulatory Visit (INDEPENDENT_AMBULATORY_CARE_PROVIDER_SITE_OTHER): Payer: Medicare Other | Admitting: *Deleted

## 2015-03-21 DIAGNOSIS — I4891 Unspecified atrial fibrillation: Secondary | ICD-10-CM

## 2015-03-21 DIAGNOSIS — Z5181 Encounter for therapeutic drug level monitoring: Secondary | ICD-10-CM

## 2015-03-21 LAB — POCT INR: INR: 1.5

## 2015-03-25 ENCOUNTER — Ambulatory Visit (INDEPENDENT_AMBULATORY_CARE_PROVIDER_SITE_OTHER): Payer: Medicare Other | Admitting: Internal Medicine

## 2015-03-25 ENCOUNTER — Encounter: Payer: Self-pay | Admitting: Internal Medicine

## 2015-03-25 VITALS — BP 144/50 | HR 68 | Ht 66.0 in | Wt 165.4 lb

## 2015-03-25 DIAGNOSIS — Z8719 Personal history of other diseases of the digestive system: Secondary | ICD-10-CM

## 2015-03-25 DIAGNOSIS — K529 Noninfective gastroenteritis and colitis, unspecified: Secondary | ICD-10-CM | POA: Diagnosis not present

## 2015-03-25 DIAGNOSIS — Z8619 Personal history of other infectious and parasitic diseases: Secondary | ICD-10-CM

## 2015-03-25 DIAGNOSIS — Z7901 Long term (current) use of anticoagulants: Secondary | ICD-10-CM

## 2015-03-25 MED ORDER — DIPHENOXYLATE-ATROPINE 2.5-0.025 MG PO TABS: 1.0000 | ORAL_TABLET | Freq: Four times a day (QID) | ORAL | Status: DC | PRN

## 2015-03-25 MED ORDER — ELUXADOLINE 100 MG PO TABS: 100.0000 mg | ORAL_TABLET | Freq: Two times a day (BID) | ORAL | Status: DC

## 2015-03-25 MED ORDER — RIFAXIMIN 550 MG PO TABS: ORAL_TABLET | ORAL | Status: DC

## 2015-03-25 NOTE — Patient Instructions (Signed)
We have sent Lomotil to your pharmacy  Follow up in 4 months We have given you xifaxian  samples. You will take one  three times a day for 14 days

## 2015-03-25 NOTE — Progress Notes (Signed)
Subjective:    Patient ID: Yolanda Spears, female    DOB: April 13, 1935, 80 y.o.   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 fib on warfarin, hypertension, recurrent UTI, and ongoing issues with chronic diarrhea who seen for follow-up. She was last seen on 01/21/2015. After this visit C. difficile was again ruled out by PCR. Unfortunately she was hospitalized in mid to late June with TIA symptoms. These have resolved. She has continued to have ongoing diarrhea and perhaps even worse over the last several weeks. While hospitalized C. difficile was again checked and negative. Stools are watery and loose. Occasionally slightly formed but this is rare. Not occurring nocturnally. Not bloody. She did a complete gluten-free diet challenge for one month with no benefit. Weight is stable since last visit here   She's here today with her sister.  She is frustrated but remains optimistic about the diarrhea. It limits her life. She really wants it improved by the end of August when her son is going to get married at [place] As per history of present illness, otherwise negative  Current Medications, Allergies, Past Medical History, Past Surgical History, Family History and Social History were reviewed in [employer] record.     Objective:   Physical Exam BP 144/50 mmHg  Pulse 68  Ht 5\' 6"  (1.676 m)  Wt 165 lb 6 oz (75.014 kg)  BMI 26.71 kg/m2 Constitutional: Well-developed and well-nourished. No distress. HEENT: Normocephalic and atraumatic.  Conjunctivae are normal.  No scleral icterus. Neck: Neck supple. Trachea midline. Cardiovascular: Normal rate,  regular rhythm and intact distal pulses. Pulmonary/chest: Effort normal and breath sounds normal. No wheezing, rales or rhonchi. Abdominal: Soft, nontender, nondistended. Bowel sounds active throughout. There are no masses palpable.. Extremities: no clubbing, cyanosis, or edema Neurological: Alert and oriented to person place and time. Psychiatric: Normal mood and affect. Behavior is normal.  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 fib on warfarin, hypertension, recurrent UTI, and ongoing issues with chronic diarrhea who seen for follow-up  1. Chronic diarrhea/history of C. difficile colitis status post FMT -- C. difficile PCR negative 2. Diarrhea has continued. This is certainly limiting to her activities of daily living. No response to adequate gluten-free trial arguing against celiac as biopsies did as well (previous positive anti-gliadin antibody and equivocal TTG).  I would like to try additional therapy to improve chronic diarrhea now felt to be irritable bowel related. Options include Viberzi and rifaximin. Rifaximin previously prescribed but cost prohibitive. Fortunately we have enough samples in the office today to complete a 14 day dose regimen. She will be given samples for rifaximin 550 mg 3 times a day 14 days. I would like her to call in one month's time. If improved great, if not we'll try Viberzi 100 mg twice a day. She is happy with this plan. Will give  prescription for Lomotil 1 tablet or times daily as needed only if necessary for her to make office visits and social calls. Discontinue Imodium as no great  response.  2. A. fib on warfarin -- INR closely monitored, and should be with starting the nonabsorbable antibiotics rifaximin. She has INR checked tomorrow  25 min spent with pt today

## 2015-03-27 ENCOUNTER — Telehealth: Payer: Self-pay | Admitting: Internal Medicine

## 2015-03-27 ENCOUNTER — Ambulatory Visit (INDEPENDENT_AMBULATORY_CARE_PROVIDER_SITE_OTHER): Payer: Medicare Other | Admitting: *Deleted

## 2015-03-27 DIAGNOSIS — I4891 Unspecified atrial fibrillation: Secondary | ICD-10-CM

## 2015-03-27 DIAGNOSIS — Z5181 Encounter for therapeutic drug level monitoring: Secondary | ICD-10-CM

## 2015-03-27 LAB — POCT INR: INR: 1.8

## 2015-03-27 NOTE — Telephone Encounter (Signed)
Line is busy. Will try again later. 

## 2015-03-28 NOTE — Telephone Encounter (Signed)
Patient had questions about how to take xifaxan prescribed at the office visit on 03/25/15.  She is advised to take one capsule tid for 14 days.  She will call back for any additional questions or concerns

## 2015-04-01 ENCOUNTER — Other Ambulatory Visit: Payer: Self-pay | Admitting: Cardiology

## 2015-04-01 ENCOUNTER — Ambulatory Visit (INDEPENDENT_AMBULATORY_CARE_PROVIDER_SITE_OTHER): Payer: Medicare Other | Admitting: Pharmacist

## 2015-04-01 DIAGNOSIS — I4891 Unspecified atrial fibrillation: Secondary | ICD-10-CM

## 2015-04-01 DIAGNOSIS — Z5181 Encounter for therapeutic drug level monitoring: Secondary | ICD-10-CM | POA: Diagnosis not present

## 2015-04-01 LAB — POCT INR: INR: 2.8

## 2015-04-04 ENCOUNTER — Telehealth: Payer: Self-pay | Admitting: Internal Medicine

## 2015-04-05 NOTE — Telephone Encounter (Signed)
If has UTI, then okay for abx for shortest duration necessary Stick with rifaximin for the entire course.  Response can be delayed, she should be on day 11 of 14 Viberzi would be next if no response after complete course

## 2015-04-05 NOTE — Telephone Encounter (Signed)
Patient notified

## 2015-04-05 NOTE — Telephone Encounter (Signed)
Patient wants to know if she is able to take antibiotics for her a bladder infection if her primary care wants  to with recent FMT.  Her primary care drew TSH and her levels were abnormal she is to return to him soon for treatment.  She also wanted you to know that the xifaxan has not helped.

## 2015-04-09 ENCOUNTER — Ambulatory Visit (INDEPENDENT_AMBULATORY_CARE_PROVIDER_SITE_OTHER): Payer: Medicare Other | Admitting: Internal Medicine

## 2015-04-09 ENCOUNTER — Encounter: Payer: Self-pay | Admitting: Internal Medicine

## 2015-04-09 ENCOUNTER — Ambulatory Visit (INDEPENDENT_AMBULATORY_CARE_PROVIDER_SITE_OTHER): Payer: Medicare Other | Admitting: *Deleted

## 2015-04-09 VITALS — BP 144/60 | HR 80 | Ht 66.0 in | Wt 162.0 lb

## 2015-04-09 DIAGNOSIS — I495 Sick sinus syndrome: Secondary | ICD-10-CM

## 2015-04-09 DIAGNOSIS — Z45018 Encounter for adjustment and management of other part of cardiac pacemaker: Secondary | ICD-10-CM | POA: Diagnosis not present

## 2015-04-09 DIAGNOSIS — Z5181 Encounter for therapeutic drug level monitoring: Secondary | ICD-10-CM

## 2015-04-09 DIAGNOSIS — I480 Paroxysmal atrial fibrillation: Secondary | ICD-10-CM | POA: Diagnosis not present

## 2015-04-09 DIAGNOSIS — I440 Atrioventricular block, first degree: Secondary | ICD-10-CM | POA: Diagnosis not present

## 2015-04-09 DIAGNOSIS — I4891 Unspecified atrial fibrillation: Secondary | ICD-10-CM

## 2015-04-09 LAB — CUP PACEART INCLINIC DEVICE CHECK
Battery Voltage: 2.78 V
Brady Statistic AP VP Percent: 74.1 %
Brady Statistic AP VS Percent: 0.1 % — CL
Brady Statistic AS VP Percent: 25.1 %
Brady Statistic AS VS Percent: 0.8 %
Date Time Interrogation Session: 20160802150313
Lead Channel Impedance Value: 616 Ohm
Lead Channel Pacing Threshold Amplitude: 0.5 V
Lead Channel Pacing Threshold Pulse Width: 0.4 ms
Lead Channel Pacing Threshold Pulse Width: 0.4 ms
Lead Channel Sensing Intrinsic Amplitude: 11.2 mV
Lead Channel Setting Pacing Pulse Width: 0.4 ms
Lead Channel Setting Sensing Sensitivity: 2.8 mV
MDC IDC MSMT LEADCHNL RA IMPEDANCE VALUE: 452 Ohm
MDC IDC MSMT LEADCHNL RA PACING THRESHOLD AMPLITUDE: 0.5 V
MDC IDC MSMT LEADCHNL RA SENSING INTR AMPL: 2 mV
MDC IDC SET LEADCHNL RA PACING AMPLITUDE: 2 V
MDC IDC SET LEADCHNL RV PACING AMPLITUDE: 2.5 V

## 2015-04-09 LAB — POCT INR: INR: 2.2

## 2015-04-09 NOTE — Patient Instructions (Signed)
Medication Instructions:  Your physician recommends that you continue on your current medications as directed. Please refer to the Current Medication list given to you today.  Labwork: None ordered  Testing/Procedures: None ordered  Follow-Up: Your physician recommends that you schedule a follow-up appointment in: 6 weeks with Dr. Caryl Comes   Any Other Special Instructions Will Be Listed Below (If Applicable). Thank you for choosing [facility]!!

## 2015-04-09 NOTE — Progress Notes (Signed)
Patient Care Team: Kathyrn Lass, MD as PCP - General (Family Medicine) [name], MD as Consulting Physician (Gynecologic Oncology) Darlin Coco, MD as Consulting Physician (Cardiology) Jerene Bears, MD as Consulting Physician (Gastroenterology) Michael Boston, MD as Consulting Physician (General Surgery)   HPI  Yolanda Spears is a 80 y.o. female seen in followup pacemaker implanted 2007 with generator replacement 2013 for tachybradycardia syndrome with paroxysmal atrial fibrillation. She has been treated with Rythmol metoprolol and warfarin. She had a spell in August 2013 where she had transient difficulty speaking . It cleared spontaneously. Aspirin was added   Denies change in functional status    Recent GI surgery which has been complicated by persistent diarrhea. Which was attributable to C. Difficile  She continues to struggle however with episodes of good days and bad days the latter characterized by fatigue and shortness of breath and weakness. There is no apparent rhyme or reason.  There are no associated palpitations. In the past, her atrial fibrillation was significantly symptomatic.   Past Medical History  Diagnosis Date  . Pacemaker -Medtronic   . Hypertension   . Peripheral neuropathy   . High cholesterol   . Heart murmur   . Shortness of breath on exertion   . Atrial fibrillation   . History of radiation therapy   . Partial bowel obstruction JAN/2013    recurrent  . Colon polyp 2011  . Osteopenia 02/2013    T score -1.4 FRAX 11%/2.1%  . Stroke     "small stroke & several TIA's occasional visual problems / and occasional speach problems  . Arthritis   . Personal history of skin cancer   . Urinary leakage   . Hx: UTI (urinary tract infection)   . Sleep apnea     unaable to tolerate mask   . Endometrial cancer 2006    Stage IIIc  s/p radiation  . Breast cancer 1999    LOBULAR CARCINOMA IN SITU...  . Ringing in ears   . Orthostatic lightheadedness  11/27/2011  . Small bowel obstruction due to adhesions 12/28/2013  . Hx of echocardiogram     Echo (02/2014):  EF 55-60%, no RWMA, mild AI, mild MR, mild LAE, PASP 44 mmHg  . Tubular adenoma of colon   . Internal hemorrhoids   . Recurrent Clostridium difficile diarrhea     Past Surgical History  Procedure Laterality Date  . Lymph node resection  2007  . Cholecystectomy  1990's  . Mastectomy  1999    bilaterally w/lymph node dissection  . Abdominal hysterectomy  2006    BSO  . Oophorectomy      BSO  . Exploratory laparotomy  2007    remove abdominal cyst  . Insert / replace / remove pacemaker  2007    initial placement  2007 / REPLACED 2014     . Laparoscopic lysis intestinal adhesions  12/28/2013  . Abdominal adhesion surgery  12/28/2013  . Small intestine surgery  12/28/2013  . Laparoscopic lysis of adhesions N/A 12/28/2013    Procedure: LAPAROSCOPIC LYSIS OF ADHESIONS converted OPEN SMALL BOWEL;  Surgeon: Adin Hector, MD;  Location: [facility] ORS;  Service: General;  Laterality: N/A;  . Permanent pacemaker generator change N/A 03/23/2012    Procedure: PERMANENT PACEMAKER GENERATOR CHANGE;  Surgeon: Deboraha Sprang, MD;  Location: [facility] CATH LAB;  Service: Cardiovascular;  Laterality: N/A;  . Colonoscopy with propofol N/A 12/27/2014    Procedure: COLONOSCOPY WITH PROPOFOL;  Surgeon: Jerene Bears, MD;  Location: [facility] ENDOSCOPY;  Service: Gastroenterology;  Laterality: N/A;  . Fecal transplant N/A 12/27/2014    Procedure: FECAL TRANSPLANT;  Surgeon: Jerene Bears, MD;  Location: [facility] ENDOSCOPY;  Service: Gastroenterology;  Laterality: N/A;    Current Outpatient Prescriptions  Medication Sig Dispense Refill  . calcium carbonate (TUMS - DOSED IN MG ELEMENTAL CALCIUM) 500 MG chewable tablet Chew 1 tablet by mouth daily.    . Cholecalciferol (VITAMIN D-3) 5000 UNITS TABS Take 1 tablet by mouth 4 (four) times a week. Takes on Saturday, Sunday, Tuesday and Thursday    . diphenoxylate-atropine (LOMOTIL)  2.5-0.025 MG per tablet Take 1 tablet by mouth 4 (four) times daily as needed for diarrhea or loose stools. 120 tablet 1  . furosemide (LASIX) 40 MG tablet TAKE 1 TABLET BY MOUTH EVERY DAY 90 tablet 1  . hydrALAZINE (APRESOLINE) 25 MG tablet TAKE 1 TABLET BY MOUTH THREE TIMES A DAY 270 tablet 1  . KLOR-CON M10 10 MEQ tablet TAKE 1 TABLET (10 MEQ TOTAL) BY MOUTH DAILY. 30 tablet 9  . lisinopril (PRINIVIL,ZESTRIL) 10 MG tablet Take 1 tablet (10 mg total) by mouth 2 (two) times daily. 180 tablet 3  . metoprolol succinate (TOPROL-XL) 100 MG 24 hr tablet TAKE 1 TABLET BY MOUTH EVERY DAY WITH OR IMMEDIATELY FOLLOWING A MEAL 90 tablet 3  . pregabalin (LYRICA) 75 MG capsule Take 75 mg by mouth 2 (two) times daily.    . propafenone (RYTHMOL) 225 MG tablet TAKE 1 TABLET (225 MG TOTAL) BY MOUTH EVERY 8 (EIGHT) HOURS. 270 tablet 1  . rifaximin (XIFAXAN) 550 MG TABS tablet One three times a day for 14 days 60 tablet 0  . warfarin (COUMADIN) 2.5 MG tablet TAKE 1/2 TABLET BY MOUTH EVERY DAY Except on Wednesdays take a whole tablet= 2.5mg  Start 2.5mg  on 03/06/15 30 tablet 0  . ZETIA 10 MG tablet TAKE 1/2 TABLET BY MOUTH EVERY DAY 15 tablet 9   No current facility-administered medications for this visit.    Allergies  Allergen Reactions  . Other Hives    Contrast dye   . Penicillins Itching  . Shellfish-Derived Products Nausea And Vomiting  . Statins     Myalgias,elevated LFT studies  . Iohexol Hives and Itching    Itching in eyes, one hive on face.    Review of Systems negative except from HPI and PMH  Physical Exam BP 144/60 mmHg  Pulse 80  Ht 5\' 6"  (1.676 m)  Wt 162 lb (73.483 kg)  BMI 26.16 kg/m2 Well developed and well nourished in no acute distress HENT normal E scleral and icterus clear Neck Supple JVP flat; carotids brisk and full Clear to ausculation Device pocket well healed; without hematoma or erythema.  There is no tethering Regular rate and rhythm, no murmurs gallops or  rub Soft with active bowel sounds No clubbing cyanosis Trace and non-pitting Edema Alert and oriented, grossly normal motor and sensory function Skin Warm and Dry  eCG demonstrates AV pacing at 83      Assessment and  Plan  Sinus node dysfunction  First degree AV block  Pacemaker-Medtronic The patient's device was interrogated.  The information was reviewed. No changes were made in the programming.    Atrial fibrillation  Prior stroke    She has episodic weakness and fatigue. She has by device interrogation frequent episodes of atrial fibrillation. She has had in the past significant only symptomatic atrial fibrillation it begs the question as to whether the episodes of  weakness correlate with the episodes of atrial fibrillation. We discussed a variety diagnostic strategies including him. Change of antiarrhythmics therapy from propafenone--amiodarone, event recorder trying to clarify or the use of a calendar comparing it with her internal monitor. We have opted for the latter. For now, we will not undertake any other changes.  We spent more than 50% of our >25 min visit in face to face counseling regarding the above

## 2015-04-15 ENCOUNTER — Ambulatory Visit: Payer: Medicare Other | Admitting: Podiatry

## 2015-04-17 ENCOUNTER — Other Ambulatory Visit: Payer: Self-pay | Admitting: Cardiology

## 2015-04-17 ENCOUNTER — Encounter: Payer: Self-pay | Admitting: Podiatry

## 2015-04-17 ENCOUNTER — Ambulatory Visit (INDEPENDENT_AMBULATORY_CARE_PROVIDER_SITE_OTHER): Payer: Medicare Other | Admitting: Podiatry

## 2015-04-17 DIAGNOSIS — B351 Tinea unguium: Secondary | ICD-10-CM | POA: Diagnosis not present

## 2015-04-17 DIAGNOSIS — M79676 Pain in unspecified toe(s): Secondary | ICD-10-CM

## 2015-04-17 NOTE — Patient Instructions (Signed)
Apply topical antibiotic ointment to the inside of the fifth right toe daily and cover with Band-Aid until a scab forms

## 2015-04-18 NOTE — Progress Notes (Signed)
Patient ID: Yolanda Spears, female   DOB: [birth year], 80 y.o.   MRN: [id number]  Subjective: This patient presents today for scheduled visit complaining of painful toenails and requests toenail debridement  Objective: The toenails are hypertrophic, elongated, discolored and tender direct palpation 6-10  Assessment: Symptomatic onychomycoses 6-10  Plan: Debridement toenails 10 and mechanically and electrically. Slight bleeding post debridement fifth right toenail, dressed with antibiotic ointment and Band-Aid. Patient instructed to continue applying topical antibiotic ointment and Band-Aid daily to this area until a scab forms.  Reappoint at three-month intervals

## 2015-04-20 ENCOUNTER — Other Ambulatory Visit: Payer: Self-pay | Admitting: Cardiology

## 2015-04-22 ENCOUNTER — Other Ambulatory Visit: Payer: Self-pay | Admitting: *Deleted

## 2015-04-23 ENCOUNTER — Ambulatory Visit (INDEPENDENT_AMBULATORY_CARE_PROVIDER_SITE_OTHER): Payer: Medicare Other

## 2015-04-23 DIAGNOSIS — I4891 Unspecified atrial fibrillation: Secondary | ICD-10-CM | POA: Diagnosis not present

## 2015-04-23 DIAGNOSIS — Z5181 Encounter for therapeutic drug level monitoring: Secondary | ICD-10-CM | POA: Diagnosis not present

## 2015-04-23 LAB — POCT INR: INR: 2.2

## 2015-04-29 ENCOUNTER — Telehealth: Payer: Self-pay | Admitting: Cardiology

## 2015-04-29 NOTE — Telephone Encounter (Signed)
New message       Talk to the nurse about one of her medications.  She would not tell me which one---only that she is having a problem with it and want to talk to a nurse about it

## 2015-04-29 NOTE — Telephone Encounter (Signed)
She could leave off the lasix the day of the wedding and then resume it when she gets home.

## 2015-04-29 NOTE — Telephone Encounter (Signed)
Pt has a wedding this weekend that is very important to her and she is worried she will wet herself.  Pt has been taking her 40 mg of Lasix daily as ordered and has no c/o of SOB or swelling. Pt stated she currently wears a pad and disposable panties when she goes out but is very afraid of the embarrassing herself she will have at this wedding.  Pt wants to know if she can adjust the Lasix so that she will does not pee as much over the weekend, please advise.

## 2015-04-30 NOTE — Telephone Encounter (Signed)
Advised patient, verbalized understanding  

## 2015-05-23 ENCOUNTER — Ambulatory Visit (INDEPENDENT_AMBULATORY_CARE_PROVIDER_SITE_OTHER): Payer: Medicare Other | Admitting: *Deleted

## 2015-05-23 DIAGNOSIS — Z5181 Encounter for therapeutic drug level monitoring: Secondary | ICD-10-CM | POA: Diagnosis not present

## 2015-05-23 DIAGNOSIS — I4891 Unspecified atrial fibrillation: Secondary | ICD-10-CM | POA: Diagnosis not present

## 2015-05-23 LAB — POCT INR: INR: 3.1

## 2015-05-27 ENCOUNTER — Encounter: Payer: Self-pay | Admitting: Neurology

## 2015-05-27 ENCOUNTER — Ambulatory Visit (INDEPENDENT_AMBULATORY_CARE_PROVIDER_SITE_OTHER): Payer: Medicare Other | Admitting: Neurology

## 2015-05-27 ENCOUNTER — Ambulatory Visit: Payer: Medicare Other | Admitting: Internal Medicine

## 2015-05-27 VITALS — BP 158/72 | HR 68 | Ht 66.0 in | Wt 163.0 lb

## 2015-05-27 DIAGNOSIS — G629 Polyneuropathy, unspecified: Secondary | ICD-10-CM

## 2015-05-27 DIAGNOSIS — R531 Weakness: Secondary | ICD-10-CM | POA: Diagnosis not present

## 2015-05-27 DIAGNOSIS — R269 Unspecified abnormalities of gait and mobility: Secondary | ICD-10-CM

## 2015-05-27 DIAGNOSIS — G459 Transient cerebral ischemic attack, unspecified: Secondary | ICD-10-CM | POA: Diagnosis not present

## 2015-05-27 NOTE — Progress Notes (Signed)
PATIENT: Yolanda Spears DOB: 1935-03-26  Chief Complaint  Patient presents with  . Transient Ischemic Attack    She had an event of right-sided arm weakness and was evalutated in June for a TIA.  She is back to normal and has not had any further episodes.  She is currently taking Coumadin.     HISTORICAL  Yolanda Spears, is a 80 years old right-handed female accompanied by her caregiver Cecille Rubin to follow-up her most recent hospital admission in June 20 second 2016, I saw her previously most recent visit was September 2011  She presented with 3 episode of transient loss control of her right hand, right arm, lasting for a few minutes, could not handle the zipper on her purse, no loss of consciousness, similar episode in June 18, June 19, June 22.  She had past medical history of atrial fibrillation, on Coumadin treatment, most recent INR was 1.8,  Not a candidate for MRI due to previous pacemaker placement,   I have personally reviewed CAT scan of the brain in June 2016, generalized atrophy, periventricular small vessel disease, more on the left paraventricular region.   Ultrasound of carotid artery, right with tip her artery was retrograde, left vertebral artery is anterograde, bilateral internal carotid artery 1-39% stenosis. 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 fib on warfarin, hypertension, recurrent UTI, and ongoing issues with chronic diarrhea  She also had a history of  chronic gait difficulty, bilateral feet paresthesia to below knee level,  Our office saw her previously because hospital admission in July 2012 for word finding difficulties, mild confusion, lasting for 45 minutes,   Per record CT scan of the head without contrast in 2012 showed generalized  atrophy with small-vessel chronic ischemic changes of deep cerebral white matter. Tiny old lacunar infarct, right thalamus. No acute intracranial abnormalities.   CTA of the head and neck revealed 30% stenosis of proximal left subclavian artery. Atherosclerotic disease at both carotid bifurcations but without narrowing of the ICA lumen. The left vertebral artery is a large vessel, widely patent. 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 Enlargement of the thyroid with multiple nodules likely represents multinodular goiter. Atherosclerotic calcification in both carotid siphons with narrowing, estimated at 50% bilaterally. Stenosis at both supraclinoid ICA region 50-70% on the right and 30-50% on the left.   2D echo revealed the left ventricular cavity size was normal, systolic function was normal, EF was 55-60%. wall motion was normal.  She also has obstructive sleep apnea, has CPAP machine, but not using it since 2008, could no tolerate it.  She had a history of right onset bilateral lower extremity paresthesia, was diagnosed with peripheral neuropathy, she is taking lyrica, extensive evaluation in the past by outside neurologist, she reported no etiology was found, her neuropathy was confirmed by EMG nerve conduction study, she does not want to have a repeat evaluation again, .  She complains of bilateral feet burning numbing sensation, especially at nighttime, and  when she first got up in the morning time, difficulty bearing weight.  REVIEW OF SYSTEMS: Full 14 system review of systems performed and notable only for as above    ALLERGIES: Allergies  Allergen Reactions  . Other Hives    Contrast dye   . Penicillins Itching  . Shellfish-Derived Products Nausea And Vomiting  . Statins     Myalgias,elevated LFT studies  . Iohexol Hives and Itching    Itching in eyes, one hive on face.    HOME MEDICATIONS: Current Outpatient Prescriptions  Medication Sig Dispense Refill  . calcium carbonate (TUMS - DOSED IN MG ELEMENTAL CALCIUM) 500 MG chewable tablet Chew 1 tablet by mouth daily.    . Cholecalciferol (VITAMIN D-3) 5000 UNITS TABS Take 1 tablet by mouth 4 (four) times a week. Takes on Saturday, Sunday, Tuesday and Thursday    . diphenoxylate-atropine (LOMOTIL) 2.5-0.025 MG per tablet Take 1 tablet by mouth 4 (four) times daily as needed for diarrhea or loose stools. 120 tablet 1  . furosemide (LASIX) 40 MG tablet TAKE 1 TABLET BY MOUTH EVERY DAY 90 tablet 0  . hydrALAZINE (APRESOLINE) 25 MG tablet TAKE 1 TABLET BY MOUTH THREE TIMES A DAY 270 tablet 1  . KLOR-CON M10 10 MEQ tablet TAKE 1 TABLET (10 MEQ TOTAL) BY MOUTH DAILY. 30 tablet 9  . lisinopril (PRINIVIL,ZESTRIL) 10 MG tablet Take 1 tablet (10 mg total) by mouth 2 (two) times daily. 180 tablet 3  . metoprolol succinate (TOPROL-XL) 100 MG 24 hr tablet TAKE 1 TABLET BY MOUTH EVERY DAY WITH OR IMMEDIATELY FOLLOWING A MEAL 90 tablet 3  . nitrofurantoin (MACRODANTIN) 100 MG capsule TAKE 1 CAPSULE EVERY 12 HOURS DAILY.  0  . pregabalin (LYRICA) 75 MG capsule Take 75 mg by mouth 2 (two) times daily.    . propafenone (RYTHMOL) 225 MG tablet TAKE 1 TABLET BY MOUTH EVERY 8 HOURS 270 tablet 0  . warfarin (COUMADIN) 2.5 MG tablet Take as directed by coumadin clinic 30 tablet 3  . ZETIA 10 MG tablet TAKE 1/2 TABLET BY MOUTH EVERY DAY 15 tablet 9   No current facility-administered medications for this visit.    PAST MEDICAL HISTORY: Past Medical History  Diagnosis Date  . Pacemaker -Medtronic   . Hypertension   . Peripheral neuropathy   . High cholesterol   .  Heart murmur   . Shortness of breath on exertion   . Atrial fibrillation   . History of radiation therapy   . Partial bowel obstruction JAN/2013    recurrent  . Colon polyp 2011  . Osteopenia 02/2013    T score -1.4 FRAX 11%/2.1%  . Stroke     "small stroke & several TIA's occasional visual problems / and occasional speach problems  . Arthritis   . Personal history of skin cancer   . Urinary leakage   . Hx: UTI (urinary tract infection)   . Sleep apnea     unaable to tolerate mask   . Endometrial cancer 2006    Stage IIIc  s/p radiation  . Breast cancer 1999    LOBULAR CARCINOMA IN SITU...  . Ringing in ears   . Orthostatic lightheadedness 11/27/2011  . Small bowel obstruction due to adhesions 12/28/2013  . Hx of echocardiogram     Echo (02/2014):  EF 55-60%, no RWMA, mild AI, mild MR, mild LAE, PASP 44 mmHg  . Tubular adenoma of colon   . Internal hemorrhoids   . Recurrent Clostridium difficile diarrhea  PAST SURGICAL HISTORY: Past Surgical History  Procedure Laterality Date  . Lymph node resection  2007  . Cholecystectomy  1990's  . Mastectomy  1999    bilaterally w/lymph node dissection  . Abdominal hysterectomy  2006    BSO  . Oophorectomy      BSO  . Exploratory laparotomy  2007    remove abdominal cyst  . Insert / replace / remove pacemaker  2007    initial placement  2007 / REPLACED 2014     . Laparoscopic lysis intestinal adhesions  12/28/2013  . Abdominal adhesion surgery  12/28/2013  . Small intestine surgery  12/28/2013  . Laparoscopic lysis of adhesions N/A 12/28/2013    Procedure: LAPAROSCOPIC LYSIS OF ADHESIONS converted OPEN SMALL BOWEL;  Surgeon: Adin Hector, MD;  Location: [facility];  Service: General;  Laterality: N/A;  . Permanent pacemaker generator change N/A 03/23/2012    Procedure: PERMANENT PACEMAKER GENERATOR CHANGE;  Surgeon: Deboraha Sprang, MD;  Location: [facility] CATH LAB;  Service: Cardiovascular;  Laterality: N/A;  . Colonoscopy with propofol  N/A 12/27/2014    Procedure: COLONOSCOPY WITH PROPOFOL;  Surgeon: Jerene Bears, MD;  Location: [facility];  Service: Gastroenterology;  Laterality: N/A;  . Fecal transplant N/A 12/27/2014    Procedure: FECAL TRANSPLANT;  Surgeon: Jerene Bears, MD;  Location: [facility];  Service: Gastroenterology;  Laterality: N/A;    FAMILY HISTORY: Family History  Problem Relation Age of Onset  . Cancer Mother 44    UTERINE  . Breast cancer Mother 52  . Hypertension Mother   . Uterine cancer Mother   . Cancer Father     PROSTATE  . Hypertension Father   . Colon cancer Neg Hx   . Esophageal cancer Neg Hx   . Rectal cancer Neg Hx   . Stomach cancer Neg Hx     SOCIAL HISTORY:  Social History   Social History  . Marital Status: Widowed    Spouse Name: N/A  . Number of Children: 2  . Years of Education: N/A   Occupational History  . Retired    Social History Main Topics  . Smoking status: Former Smoker -- 1.00 packs/day for 40 years    Types: Cigarettes    Quit date: 11/26/1998  . Smokeless tobacco: Never Used  . Alcohol Use: No     Comment: 09/14/11 "last drink ~ 08/31/2009"  . Drug Use: No  . Sexual Activity: No   Other Topics Concern  . Not on file   Social History Narrative   Widowed mother of 2     PHYSICAL EXAM   Filed Vitals:   05/27/15 1132  BP: 158/72  Pulse: 68  Height: 5\' 6"  (1.676 m)  Weight: 163 lb (73.936 kg)    Not recorded      Body mass index is 26.32 kg/(m^2).  PHYSICAL EXAMNIATION:  Gen: NAD, conversant, well nourised, obese, well groomed                     Cardiovascular: Regular rate rhythm, no peripheral edema, warm, nontender. Eyes: Conjunctivae clear without exudates or hemorrhage Neck: Supple, no carotid bruise. Pulmonary: Clear to auscultation bilaterally   NEUROLOGICAL EXAM:  MENTAL STATUS: Speech:    Speech is normal; fluent and spontaneous with normal comprehension.  Cognition:     Orientation to time, place and person      Normal recent and remote memory     Normal Attention span and concentration  Normal Language, naming, repeating,spontaneous speech     Fund of knowledge   CRANIAL NERVES: CN II: Visual fields are full to confrontation. Fundoscopic exam is normal with sharp discs and no vascular changes. Pupils are round equal and briskly reactive to light. CN III, IV, VI: extraocular movement are normal. No ptosis. CN V: Facial sensation is intact to pinprick in all 3 divisions bilaterally. Corneal responses are intact.  CN VII: Face is symmetric with normal eye closure and smile. CN VIII: Hearing is normal to rubbing fingers CN IX, X: Palate elevates symmetrically. Phonation is normal. CN XI: Head turning and shoulder shrug are intact CN XII: Tongue is midline with normal movements and no atrophy.  MOTOR: Muscle bulk and tone are normal. she has mild bilateral ankle dorsiflexion weakness.  REFLEXES: Reflexes are1 and symmetric at the biceps, triceps, knees, and  absent at ankles. Plantar responses are flexor.  SENSORY: Length dependent decreased to light touch, pinprito below knee level  Coordination: Rapid alternating movements and fine finger movements are intact. There is no dysmetria on finger-to-nose and heel-knee-shin.    GAIT/STANCE: She needs assistance to get up from seated position, bilateral foot drop, cautious, unsteady, wide-based gait.    DIAGNOSTIC DATA (LABS, IMAGING, TESTING) - I reviewed patient records, labs, notes, testing and imaging myself where available.   ASSESSMENT AND PLAN  MIRAKLE TOMLIN is a 80 y.o. female    Recurrent episodes of right hand weakness, previous history of word finding difficulty, mild confusion,  Localized the problem to left frontal region  Differentiation diagnosis including partial seizure versus TIA   Proceed with EEG  Document all event  She is hesitate to be empirically treated with antiepileptic medications.   Atrial  fibrillation:  On chronic Coumadin treatment.  Gait difficulty:   Distal weakness, length dependent sensory changes   Differentiation diagnosis, lumbosacral radiculopathy, peripheral neuropathy   If she has worsening diarrhea, incontinence, may consider CT of lumbar spine, repeat EMG nerve conduction study.   Yolanda Spears, M.D. Ph.D.  [facility] [address], [place], [place] [address] Ph: [phone] Fax: [phone]  CC: Referring Provider

## 2015-06-07 ENCOUNTER — Ambulatory Visit (INDEPENDENT_AMBULATORY_CARE_PROVIDER_SITE_OTHER): Payer: Medicare Other | Admitting: Cardiology

## 2015-06-07 ENCOUNTER — Ambulatory Visit (INDEPENDENT_AMBULATORY_CARE_PROVIDER_SITE_OTHER): Payer: Medicare Other | Admitting: *Deleted

## 2015-06-07 ENCOUNTER — Encounter: Payer: Self-pay | Admitting: Cardiology

## 2015-06-07 VITALS — BP 122/48 | HR 80 | Ht 66.0 in | Wt 164.0 lb

## 2015-06-07 DIAGNOSIS — I4891 Unspecified atrial fibrillation: Secondary | ICD-10-CM | POA: Diagnosis not present

## 2015-06-07 DIAGNOSIS — I5033 Acute on chronic diastolic (congestive) heart failure: Secondary | ICD-10-CM | POA: Diagnosis not present

## 2015-06-07 DIAGNOSIS — I480 Paroxysmal atrial fibrillation: Secondary | ICD-10-CM | POA: Diagnosis not present

## 2015-06-07 DIAGNOSIS — B9689 Other specified bacterial agents as the cause of diseases classified elsewhere: Secondary | ICD-10-CM | POA: Diagnosis not present

## 2015-06-07 DIAGNOSIS — A498 Other bacterial infections of unspecified site: Secondary | ICD-10-CM

## 2015-06-07 DIAGNOSIS — Z5181 Encounter for therapeutic drug level monitoring: Secondary | ICD-10-CM

## 2015-06-07 DIAGNOSIS — I119 Hypertensive heart disease without heart failure: Secondary | ICD-10-CM

## 2015-06-07 LAB — POCT INR: INR: 2.5

## 2015-06-07 NOTE — Patient Instructions (Signed)
Medication Instructions:  Your physician recommends that you continue on your current medications as directed. Please refer to the Current Medication list given to you today.  Labwork: none  Testing/Procedures: none  Follow-Up: Your physician recommends that you schedule a follow-up appointment in: 4 month ov with Lori G NP or Scott W PA      

## 2015-06-07 NOTE — Progress Notes (Signed)
Cardiology Office Note   Date:  06/07/2015   ID:  Yolanda Spears, DOB 10/02/1934, MRN [id number]  PCP:  Tawanna Solo, MD  Cardiologist: Darlin Coco MD  No chief complaint on file.     History of Present Illness: Yolanda Spears is a 80 y.o. female who presents for a four-month follow-up office visit.  This pleasant 80 year old woman is seen for a scheduled followup office visit. The patient has a past history of tachybradycardia syndrome and had a pacemaker implanted in 2007. She had a pacemaker generator change by Dr. Caryl Comes on 03/23/12. She is on chronic Coumadin anticoagulation. She has a past history of paroxysmal atrial fibrillation and is on generic Rythmol.. The patient has not been having any lightheadedness or syncope. She has not been aware of any racing of her heart. No palpitations. He does have chronic positional vertigo when she changes positions or rolls over in bed. She has a history of peripheral neuropathy and sees Dr. Krista Blue who has her on Lyrica. She sleeps poorly. She has been told that she has sleep apnea. She tried a CPAP machine years ago but was unable to tolerate it.   The patient underwent abdominal surgery in April 2015 for recurrent small bowel obstruction. Following surgery she went to a nursing facility. While there she went into congestive heart failure with severe dyspnea hypoxemia and peripheral edema. Lasix was adjusted and she improved over the next several weeks. She feels now that she is back to baseline in terms of her breathing. She is having chronic diarrhea. She failed to clear her C. difficile infection with vancomycin. More recently she had a fecal transplant .she is being followed by infectious disease. Dr. Hilarie Fredrickson is her gastroenterologist.She reports that her last 2 tests for C. difficile were negative.  However her watery diarrheal stools continue. She has a history of bladder infections but her urologist doesn't want to put her on  any anti-biotics because of her history of C. difficile and this is understandable.  On the cardiac standpoint she has not had any recurrence of her congestive heart failure. No angina pectoris. She will sometimes have chest pain on deep inspiration.   Past Medical History  Diagnosis Date  . Pacemaker -Medtronic   . Hypertension   . Peripheral neuropathy   . High cholesterol   . Heart murmur   . Shortness of breath on exertion   . Atrial fibrillation   . History of radiation therapy   . Partial bowel obstruction JAN/2013    recurrent  . Colon polyp 2011  . Osteopenia 02/2013    T score -1.4 FRAX 11%/2.1%  . Stroke     "small stroke & several TIA's occasional visual problems / and occasional speach problems  . Arthritis   . Personal history of skin cancer   . Urinary leakage   . Hx: UTI (urinary tract infection)   . Sleep apnea     unaable to tolerate mask   . Endometrial cancer 2006    Stage IIIc  s/p radiation  . Breast cancer 1999    LOBULAR CARCINOMA IN SITU...  . Ringing in ears   . Orthostatic lightheadedness 11/27/2011  . Small bowel obstruction due to adhesions 12/28/2013  . Hx of echocardiogram     Echo (02/2014):  EF 55-60%, no RWMA, mild AI, mild MR, mild LAE, PASP 44 mmHg  . Tubular adenoma of colon   . Internal hemorrhoids   . Recurrent Clostridium difficile diarrhea  Past Surgical History  Procedure Laterality Date  . Lymph node resection  2007  . Cholecystectomy  1990's  . Mastectomy  1999    bilaterally w/lymph node dissection  . Abdominal hysterectomy  2006    BSO  . Oophorectomy      BSO  . Exploratory laparotomy  2007    remove abdominal cyst  . Insert / replace / remove pacemaker  2007    initial placement  2007 / REPLACED 2014     . Laparoscopic lysis intestinal adhesions  12/28/2013  . Abdominal adhesion surgery  12/28/2013  . Small intestine surgery  12/28/2013  . Laparoscopic lysis of adhesions N/A 12/28/2013    Procedure:  LAPAROSCOPIC LYSIS OF ADHESIONS converted OPEN SMALL BOWEL;  Surgeon: Adin Hector, MD;  Location: [facility];  Service: General;  Laterality: N/A;  . Permanent pacemaker generator change N/A 03/23/2012    Procedure: PERMANENT PACEMAKER GENERATOR CHANGE;  Surgeon: Deboraha Sprang, MD;  Location: [facility] CATH LAB;  Service: Cardiovascular;  Laterality: N/A;  . Colonoscopy with propofol N/A 12/27/2014    Procedure: COLONOSCOPY WITH PROPOFOL;  Surgeon: Jerene Bears, MD;  Location: [facility];  Service: Gastroenterology;  Laterality: N/A;  . Fecal transplant N/A 12/27/2014    Procedure: FECAL TRANSPLANT;  Surgeon: Jerene Bears, MD;  Location: [facility];  Service: Gastroenterology;  Laterality: N/A;     Current Outpatient Prescriptions  Medication Sig Dispense Refill  . calcium carbonate (TUMS - DOSED IN MG ELEMENTAL CALCIUM) 500 MG chewable tablet Chew 1 tablet by mouth daily.    . Cholecalciferol (VITAMIN D-3) 5000 UNITS TABS Take 1 tablet by mouth 4 (four) times a week. Takes on Saturday, Sunday, Tuesday and Thursday    . diphenoxylate-atropine (LOMOTIL) 2.5-0.025 MG per tablet Take 1 tablet by mouth 4 (four) times daily as needed for diarrhea or loose stools. 120 tablet 1  . furosemide (LASIX) 40 MG tablet TAKE 1 TABLET BY MOUTH EVERY DAY 90 tablet 0  . hydrALAZINE (APRESOLINE) 25 MG tablet TAKE 1 TABLET BY MOUTH THREE TIMES A DAY 270 tablet 1  . KLOR-CON M10 10 MEQ tablet TAKE 1 TABLET (10 MEQ TOTAL) BY MOUTH DAILY. 30 tablet 9  . lisinopril (PRINIVIL,ZESTRIL) 10 MG tablet Take 1 tablet (10 mg total) by mouth 2 (two) times daily. 180 tablet 3  . metoprolol succinate (TOPROL-XL) 100 MG 24 hr tablet TAKE 1 TABLET BY MOUTH EVERY DAY WITH OR IMMEDIATELY FOLLOWING A MEAL 90 tablet 3  . pregabalin (LYRICA) 75 MG capsule Take 75 mg by mouth 2 (two) times daily.    . Probiotic Product ([employer] PROBIOTIC) CAPS Take 1 capsule by mouth daily.    . propafenone (RYTHMOL) 225 MG tablet TAKE 1 TABLET BY MOUTH EVERY 8  HOURS 270 tablet 0  . warfarin (COUMADIN) 2.5 MG tablet Take as directed by coumadin clinic 30 tablet 3  . ZETIA 10 MG tablet TAKE 1/2 TABLET BY MOUTH EVERY DAY 15 tablet 9   No current facility-administered medications for this visit.    Allergies:   Other; Penicillins; Shellfish-derived products; Statins; and Iohexol    Social History:  The patient  reports that she quit smoking about 16 years ago. Her smoking use included Cigarettes. She has a 40 pack-year smoking history. She has never used smokeless tobacco. She reports that she does not drink alcohol or use illicit drugs.   Family History:  The patient's family history includes Breast cancer (age of onset: 45) in her mother;  Cancer in her father; Cancer (age of onset: 1) in her mother; Hypertension in her father and mother; Uterine cancer in her mother. There is no history of Colon cancer, Esophageal cancer, Rectal cancer, or Stomach cancer.    ROS:  Please see the history of present illness.   Otherwise, review of systems are positive for none.   All other systems are reviewed and negative.    PHYSICAL EXAM: VS:  BP 122/48 mmHg  Pulse 80  Ht 5\' 6"  (1.676 m)  Wt 164 lb (74.39 kg)  BMI 26.48 kg/m2 , BMI Body mass index is 26.48 kg/(m^2). GEN: Well nourished, well developed, in no acute distress HEENT: normal Neck: no JVD, carotid bruits, or masses Cardiac: RRR; grade 2/6 systolic ejection murmur at the base.  No, rubs, or gallops,no edema  Respiratory:  clear to auscultation bilaterally, normal work of breathing GI: soft, nontender, nondistended, + BS MS: no deformity or atrophy Skin: warm and dry, no rash Neuro:  Strength and sensation are intact Psych: euthymic mood, full affect   EKG:  EKG is not ordered today.    Recent Labs: 02/25/2015: ALT 17 02/27/2015: BUN 20; Creatinine, Ser 1.47*; Hemoglobin 9.5*; Platelets 172; Potassium 3.4*; Sodium 141    Lipid Panel    Component Value Date/Time   CHOL 117 02/27/2015  0715   TRIG 85 02/27/2015 0715   HDL 34* 02/27/2015 0715   CHOLHDL 3.4 02/27/2015 0715   VLDL 17 02/27/2015 0715   LDLCALC 66 02/27/2015 0715      Wt Readings from Last 3 Encounters:  06/07/15 164 lb (74.39 kg)  05/27/15 163 lb (73.936 kg)  04/09/15 162 lb (73.483 kg)         ASSESSMENT AND PLAN:   1. paroxysmal atrial fibrillation.Clinically today it appears that she is in normal sinus rhythm. She is on long-term warfarin. Her Chadds Vasc score is 7 2. tachybradycardia syndrome status post pacemaker 3. prior TIA 4. obstructive sleep apnea. She does not use a CPAP machine 5. recent surgery for recurrent small bowel obstruction in April 2015 6. chronic diastolic heart failure. Echocardiogram 02/05/14 showed ejection fraction 55-60% with mild aortic insufficiency and mild mitral regurgitation 7. diarrhea, chronic. Positive C. Difficile. Recent fecal transplant. Followed by infectious disease and by GI  Disposition: Continue current medication.  Recheck in 4 months.   Current medicines are reviewed at length with the patient today.  The patient does not have concerns regarding medicines.  The following changes have been made:  no change  Labs/ tests ordered today include:  No orders of the defined types were placed in this encounter.      Berna Spare MD 06/07/2015 6:14 PM    [facility] [place], [place], [place]  [address] Phone: [phone]; Fax: [phone]

## 2015-06-12 ENCOUNTER — Other Ambulatory Visit: Payer: Medicare Other

## 2015-06-18 ENCOUNTER — Ambulatory Visit (INDEPENDENT_AMBULATORY_CARE_PROVIDER_SITE_OTHER): Payer: Medicare Other | Admitting: Internal Medicine

## 2015-06-18 ENCOUNTER — Encounter: Payer: Self-pay | Admitting: Internal Medicine

## 2015-06-18 VITALS — BP 132/70 | HR 63 | Ht 67.0 in | Wt 163.8 lb

## 2015-06-18 DIAGNOSIS — I440 Atrioventricular block, first degree: Secondary | ICD-10-CM | POA: Diagnosis not present

## 2015-06-18 DIAGNOSIS — I480 Paroxysmal atrial fibrillation: Secondary | ICD-10-CM

## 2015-06-18 LAB — TSH: TSH: 0.013 u[IU]/mL — AB (ref 0.350–4.500)

## 2015-06-18 NOTE — Progress Notes (Signed)
Patient Care Team: Kathyrn Lass, MD as PCP - General (Family Medicine) [name], MD as Consulting Physician (Gynecologic Oncology) Darlin Coco, MD as Consulting Physician (Cardiology) Jerene Bears, MD as Consulting Physician (Gastroenterology) Michael Boston, MD as Consulting Physician (General Surgery)   HPI  Yolanda Spears is a 80 y.o. female seen in followup pacemaker implanted 2007 with generator replacement 2013 for tachybradycardia syndrome with paroxysmal atrial fibrillation. She has been treated with Rythmol metoprolol and warfarin. She had a spell in August 2013 where she had transient difficulty speaking . It cleared spontaneously. Aspirin was added   Denies change in functional status    Recent GI surgery which has been complicated by persistent diarrhea. Which was attributable to C. Difficile  She continues to struggle however with episodes of good days and bad days the latter characterized by fatigue and shortness of breath and weakness. There is no apparent rhyme or reason.  There are no associated palpitations. In the past, her atrial fibrillation was significantly symptomatic. The last visit we decided to try to use a calendar to demonstrate correlation between her symptoms and her arrhythmia as opposed to changing her antiarrhythmics empirically.   Past Medical History  Diagnosis Date  . Pacemaker -Medtronic   . Hypertension   . Peripheral neuropathy ([place])   . High cholesterol   . Heart murmur   . Shortness of breath on exertion   . Atrial fibrillation ([place])   . History of radiation therapy   . Partial bowel obstruction (HCC) JAN/2013    recurrent  . Colon polyp 2011  . Osteopenia 02/2013    T score -1.4 FRAX 11%/2.1%  . Stroke [facility])     "small stroke & several TIA's occasional visual problems / and occasional speach problems  . Arthritis   . Personal history of skin cancer   . Urinary leakage   . Hx: UTI (urinary tract infection)   . Sleep apnea     unaable to tolerate mask   . Endometrial cancer ([place]) 2006    Stage IIIc  s/p radiation  . Breast cancer ([place]) 1999    LOBULAR CARCINOMA IN SITU...  . Ringing in ears   . Orthostatic lightheadedness 11/27/2011  . Small bowel obstruction due to adhesions ([place]) 12/28/2013  . Hx of echocardiogram     Echo (02/2014):  EF 55-60%, no RWMA, mild AI, mild MR, mild LAE, PASP 44 mmHg  . Tubular adenoma of colon   . Internal hemorrhoids   . Recurrent Clostridium difficile diarrhea     Past Surgical History  Procedure Laterality Date  . Lymph node resection  2007  . Cholecystectomy  1990's  . Mastectomy  1999    bilaterally w/lymph node dissection  . Abdominal hysterectomy  2006    BSO  . Oophorectomy      BSO  . Exploratory laparotomy  2007    remove abdominal cyst  . Insert / replace / remove pacemaker  2007    initial placement  2007 / REPLACED 2014     . Laparoscopic lysis intestinal adhesions  12/28/2013  . Abdominal adhesion surgery  12/28/2013  . Small intestine surgery  12/28/2013  . Laparoscopic lysis of adhesions N/A 12/28/2013    Procedure: LAPAROSCOPIC LYSIS OF ADHESIONS converted OPEN SMALL BOWEL;  Surgeon: Adin Hector, MD;  Location: [facility] ORS;  Service: General;  Laterality: N/A;  . Permanent pacemaker generator change N/A 03/23/2012    Procedure: PERMANENT PACEMAKER GENERATOR CHANGE;  Surgeon: Revonda Standard  Caryl Comes, MD;  Location: [facility] CATH LAB;  Service: Cardiovascular;  Laterality: N/A;  . Colonoscopy with propofol N/A 12/27/2014    Procedure: COLONOSCOPY WITH PROPOFOL;  Surgeon: Jerene Bears, MD;  Location: [facility] ENDOSCOPY;  Service: Gastroenterology;  Laterality: N/A;  . Fecal transplant N/A 12/27/2014    Procedure: FECAL TRANSPLANT;  Surgeon: Jerene Bears, MD;  Location: [facility] ENDOSCOPY;  Service: Gastroenterology;  Laterality: N/A;    Current Outpatient Prescriptions  Medication Sig Dispense Refill  . calcium carbonate (TUMS - DOSED IN MG ELEMENTAL CALCIUM) 500 MG chewable tablet Chew  1 tablet by mouth daily.    . Cholecalciferol (VITAMIN D-3) 5000 UNITS TABS Take 1 tablet by mouth 4 (four) times a week. Takes on Saturday, Sunday, Tuesday and Thursday    . diphenoxylate-atropine (LOMOTIL) 2.5-0.025 MG per tablet Take 1 tablet by mouth 4 (four) times daily as needed for diarrhea or loose stools. 120 tablet 1  . furosemide (LASIX) 40 MG tablet TAKE 1 TABLET BY MOUTH EVERY DAY 90 tablet 0  . hydrALAZINE (APRESOLINE) 25 MG tablet TAKE 1 TABLET BY MOUTH THREE TIMES A DAY 270 tablet 1  . KLOR-CON M10 10 MEQ tablet TAKE 1 TABLET (10 MEQ TOTAL) BY MOUTH DAILY. 30 tablet 9  . lisinopril (PRINIVIL,ZESTRIL) 10 MG tablet Take 1 tablet (10 mg total) by mouth 2 (two) times daily. 180 tablet 3  . metoprolol succinate (TOPROL-XL) 100 MG 24 hr tablet TAKE 1 TABLET BY MOUTH EVERY DAY WITH OR IMMEDIATELY FOLLOWING A MEAL 90 tablet 3  . pregabalin (LYRICA) 75 MG capsule Take 75 mg by mouth 2 (two) times daily.    . Probiotic Product ([employer] PROBIOTIC) CAPS Take 1 capsule by mouth daily.    . propafenone (RYTHMOL) 225 MG tablet Take one tablet by mouth twice daily    . warfarin (COUMADIN) 2.5 MG tablet Take as directed by coumadin clinic 30 tablet 3  . ZETIA 10 MG tablet TAKE 1/2 TABLET BY MOUTH EVERY DAY 15 tablet 9   No current facility-administered medications for this visit.    Allergies  Allergen Reactions  . Other Hives    Contrast dye   . Penicillins Itching  . Shellfish-Derived Products Nausea And Vomiting  . Statins Other (See Comments)    Myalgias,elevated LFT studies  . Iohexol Hives and Itching    Itching in eyes, one hive on face.    Review of Systems negative except from HPI and PMH  Physical Exam BP 132/70 mmHg  Pulse 63  Ht 5\' 7"  (1.702 m)  Wt 163 lb 12.8 oz (74.299 kg)  BMI 25.65 kg/m2 Well developed and well nourished in no acute distress HENT normal E scleral and icterus clear Neck Supple JVP flat; carotids brisk and full Clear to ausculation Device pocket  well healed; without hematoma or erythema.  There is no tethering Regular rate and rhythm, no murmurs gallops or rub Soft with active bowel sounds No clubbing cyanosis Trace and non-pitting Edema Alert and oriented, grossly normal motor and sensory function Skin Warm and Dry      Assessment and  Plan  Sinus node dysfunction  First degree AV block  Pacemaker-Medtronic The patient's device was interrogated.  The information was reviewed. No changes were made in the programming.    Atrial fibrillation  Prior stroke  Gait instability-temporal   We reviewed the calendar of her good days and her bad days. There appeared to be no correlation with the events of atrial fibrillation. Hence, given the  fact that she has atrial fibrillation about 25% of the time on her current dose of propafenone, we discussed a strategy of gradual reduction in dose to see if there were a) increase in atrial fibrillation burden and be) was is correlated with a worsening of her functional status.  In the event that atrial fibrillation increases his symptoms worsen, and that would support the observation in the past that atrial fibrillation was symptomatic and that alternative antiarrhythmic therapy could be considered. In the event that the symptoms don't worsen then further down titration of the propafenone would be undertaken going from 225 twice daily today to 150 twice a day at the next visit.  She also has a wobbliness. I suggested she consider increasing the head of her bed.

## 2015-06-18 NOTE — Patient Instructions (Signed)
Medication Instructions: 1) Increase Rythmol (propafenone) to 225 mg one tablet by mouth twice daily  Labwork: - Your physician recommends that you have lab work today: TSH  Procedures/Testing: - none  Follow-Up: - Your physician recommends that you schedule a follow-up appointment in: 6 weeks with Chanetta Marshall, NP for Dr. Caryl Comes.  - Your physician recommends that you schedule a follow-up appointment in: 12 weeks with Dr. Caryl Comes  Any Additional Special Instructions Will Be Listed Below (If Applicable). - none

## 2015-06-19 ENCOUNTER — Other Ambulatory Visit: Payer: Medicare Other

## 2015-06-21 ENCOUNTER — Inpatient Hospital Stay (HOSPITAL_COMMUNITY): Admission: RE | Admit: 2015-06-21 | Payer: Medicare Other | Source: Ambulatory Visit

## 2015-06-25 LAB — CUP PACEART INCLINIC DEVICE CHECK
Battery Remaining Longevity: 109 mo
Brady Statistic AP VP Percent: 75 %
Brady Statistic AP VS Percent: 0 %
Brady Statistic AS VP Percent: 24 %
Brady Statistic AS VS Percent: 1 %
Implantable Lead Implant Date: 20071017
Implantable Lead Location: 753860
Lead Channel Impedance Value: 458 Ohm
Lead Channel Impedance Value: 599 Ohm
Lead Channel Pacing Threshold Amplitude: 0.5 V
Lead Channel Pacing Threshold Amplitude: 0.625 V
Lead Channel Pacing Threshold Pulse Width: 0.4 ms
Lead Channel Pacing Threshold Pulse Width: 0.4 ms
Lead Channel Sensing Intrinsic Amplitude: 1.4 mV
Lead Channel Sensing Intrinsic Amplitude: 8 mV
Lead Channel Setting Pacing Amplitude: 2.5 V
Lead Channel Setting Sensing Sensitivity: 4 mV
MDC IDC LEAD IMPLANT DT: 20071017
MDC IDC LEAD LOCATION: 753859
MDC IDC MSMT BATTERY IMPEDANCE: 204 Ohm
MDC IDC MSMT BATTERY VOLTAGE: 2.78 V
MDC IDC MSMT LEADCHNL RA PACING THRESHOLD AMPLITUDE: 0.5 V
MDC IDC MSMT LEADCHNL RA PACING THRESHOLD AMPLITUDE: 0.5 V
MDC IDC MSMT LEADCHNL RA PACING THRESHOLD PULSEWIDTH: 0.4 ms
MDC IDC MSMT LEADCHNL RV PACING THRESHOLD PULSEWIDTH: 0.4 ms
MDC IDC SESS DTM: 20161011185705
MDC IDC SET LEADCHNL RA PACING AMPLITUDE: 2 V
MDC IDC SET LEADCHNL RV PACING PULSEWIDTH: 0.4 ms

## 2015-06-28 ENCOUNTER — Telehealth: Payer: Self-pay | Admitting: Internal Medicine

## 2015-06-28 NOTE — Telephone Encounter (Signed)
Pyrtle Pt with history of multiple cdiff infections and has had a fecal transplant. Pt has been placed on cipro for a bladder infection and wants to make sure it is safe for her to take due to history. Please advise as doc of the day.

## 2015-06-28 NOTE — Telephone Encounter (Signed)
Spoke with pt and she is aware.

## 2015-06-28 NOTE — Telephone Encounter (Signed)
It will put her at risk for c. Diff again. She needs to ask of whomever is prescribing the cipro that they are very certain she needs it. She should also begin once daily probiotic like florastor throughout the duration of the antibiotic course.

## 2015-07-03 ENCOUNTER — Telehealth: Payer: Self-pay | Admitting: Cardiology

## 2015-07-03 NOTE — Telephone Encounter (Signed)
New message     For [facility] Calling to let you know she will be in the office on Friday for coumadin clinic and she will pick up the letter from Dr Mare Ferrari to have her mail delivered to her home.  Pt is aware Rip Harbour is out and ok to forward msg to her when she returns.  Call if there is a problem

## 2015-07-04 ENCOUNTER — Encounter: Payer: Self-pay | Admitting: *Deleted

## 2015-07-04 NOTE — Telephone Encounter (Signed)
Letter done

## 2015-07-05 ENCOUNTER — Ambulatory Visit (INDEPENDENT_AMBULATORY_CARE_PROVIDER_SITE_OTHER): Payer: Medicare Other | Admitting: *Deleted

## 2015-07-05 DIAGNOSIS — I4891 Unspecified atrial fibrillation: Secondary | ICD-10-CM | POA: Diagnosis not present

## 2015-07-05 DIAGNOSIS — Z5181 Encounter for therapeutic drug level monitoring: Secondary | ICD-10-CM

## 2015-07-05 LAB — POCT INR: INR: 2.2

## 2015-07-12 ENCOUNTER — Other Ambulatory Visit: Payer: Self-pay | Admitting: Cardiology

## 2015-07-12 MED ORDER — PROPAFENONE HCL 225 MG PO TABS: ORAL_TABLET | ORAL | Status: DC

## 2015-07-17 ENCOUNTER — Other Ambulatory Visit: Payer: Medicare Other

## 2015-07-24 ENCOUNTER — Encounter: Payer: Self-pay | Admitting: Podiatry

## 2015-07-24 ENCOUNTER — Ambulatory Visit (INDEPENDENT_AMBULATORY_CARE_PROVIDER_SITE_OTHER): Payer: Medicare Other | Admitting: Podiatry

## 2015-07-24 DIAGNOSIS — B351 Tinea unguium: Secondary | ICD-10-CM

## 2015-07-24 DIAGNOSIS — M79676 Pain in unspecified toe(s): Secondary | ICD-10-CM | POA: Diagnosis not present

## 2015-07-24 NOTE — Patient Instructions (Signed)
Move been days 1-2 days on the right big toe and second right toe. Apply topical antibiotic ointment daily to these areas and cover with Band-Aids until a scab forms

## 2015-07-25 NOTE — Progress Notes (Signed)
Patient ID: Yolanda Spears, female   DOB: 11/09/1934, 80 y.o.   MRN: [id number]  Subjective: This patient presents today for scheduled visit complaining of painful toenails and requests nail debridement  Objective: No open skin lesions bilaterally The toenails are elongated, hypertrophic, deformed, discolored and tender direct palpation 6-10  Assessment: Symptomatic onychomycoses 6-10  Plan: Debridement toenails 10 mechanically and electrically. Slight bleeding distal right hallux and second right toe. These areas are dressed with topical antibiotic ointment and Band-Aids. Patient is made aware of this and advised to remove the Band-Aid 1-2 days and continue to apply topical antibiotic ointment and dressings daily until a scab forms   Reappoint 3 months

## 2015-08-05 ENCOUNTER — Encounter: Payer: Medicare Other | Admitting: Nurse Practitioner

## 2015-08-05 NOTE — Progress Notes (Signed)
This encounter was created in error - please disregard.

## 2015-08-06 ENCOUNTER — Telehealth: Payer: Self-pay | Admitting: Internal Medicine

## 2015-08-06 DIAGNOSIS — R197 Diarrhea, unspecified: Secondary | ICD-10-CM

## 2015-08-06 NOTE — Telephone Encounter (Signed)
Pt states she was around a child at Thanksgiving that had projectile vomiting. Pt states that Saturday she started having extreme diarrhea. Was on an antibiotic the end of Oct. Pt states it was a sulfa drug. Pt wants to know if she can be given something to slow down the diarrhea. Please advise.

## 2015-08-06 NOTE — Telephone Encounter (Signed)
Pt aware and will come for c diff test. Pt instructed to call back when she has taken specimen to lab so that lomotil can be sent to pharmacy.

## 2015-08-06 NOTE — Telephone Encounter (Signed)
Given her hx she needs a c diff test PCR, order stat Lomotil can be used QID PRN after submitting stool sample

## 2015-08-09 ENCOUNTER — Other Ambulatory Visit: Payer: Medicare Other

## 2015-08-09 DIAGNOSIS — R197 Diarrhea, unspecified: Secondary | ICD-10-CM

## 2015-08-10 LAB — CLOSTRIDIUM DIFFICILE BY PCR: CDIFFPCR: NOT DETECTED

## 2015-08-12 ENCOUNTER — Other Ambulatory Visit: Payer: Self-pay

## 2015-08-12 MED ORDER — DIPHENOXYLATE-ATROPINE 2.5-0.025 MG PO TABS: ORAL_TABLET | ORAL | Status: DC

## 2015-08-26 ENCOUNTER — Ambulatory Visit (INDEPENDENT_AMBULATORY_CARE_PROVIDER_SITE_OTHER): Payer: Medicare Other | Admitting: *Deleted

## 2015-08-26 DIAGNOSIS — I481 Persistent atrial fibrillation: Secondary | ICD-10-CM | POA: Diagnosis not present

## 2015-08-26 DIAGNOSIS — I4891 Unspecified atrial fibrillation: Secondary | ICD-10-CM

## 2015-08-26 DIAGNOSIS — Z5181 Encounter for therapeutic drug level monitoring: Secondary | ICD-10-CM | POA: Diagnosis not present

## 2015-08-26 DIAGNOSIS — I4819 Other persistent atrial fibrillation: Secondary | ICD-10-CM

## 2015-08-26 LAB — POCT INR: INR: 2.4

## 2015-09-10 ENCOUNTER — Telehealth: Payer: Self-pay | Admitting: Neurology

## 2015-09-10 NOTE — Telephone Encounter (Signed)
Patient called to inquire as to wether or not she needs to keep appointment 09/18/14 since she never had EEG done due to our office being unable to do EEG. Please advise.

## 2015-09-11 ENCOUNTER — Encounter: Payer: Self-pay | Admitting: Internal Medicine

## 2015-09-11 ENCOUNTER — Ambulatory Visit (INDEPENDENT_AMBULATORY_CARE_PROVIDER_SITE_OTHER): Payer: Medicare Other | Admitting: Internal Medicine

## 2015-09-11 VITALS — BP 134/74 | Ht 66.5 in | Wt 167.0 lb

## 2015-09-11 DIAGNOSIS — I480 Paroxysmal atrial fibrillation: Secondary | ICD-10-CM | POA: Diagnosis not present

## 2015-09-11 DIAGNOSIS — Z950 Presence of cardiac pacemaker: Secondary | ICD-10-CM | POA: Diagnosis not present

## 2015-09-11 DIAGNOSIS — I495 Sick sinus syndrome: Secondary | ICD-10-CM | POA: Diagnosis not present

## 2015-09-11 MED ORDER — VERAPAMIL HCL ER 240 MG PO TBCR: EXTENDED_RELEASE_TABLET | ORAL | Status: DC

## 2015-09-11 NOTE — Telephone Encounter (Signed)
Spoke to patient - EEG and follow up appts have now been scheduled.

## 2015-09-11 NOTE — Patient Instructions (Signed)
Medication Instructions: 1) Decrease metoprolol succinate to 50 mg once daily x 7 days, then      Decrease metoprolol succinate to 25 mg once daily x 7 days  2) When you decrease the metoprolol to 25 mg once daily, you will then      Start verapamil 120 mg once daily x 7 days, then  3) Stop metoprolol succinate and increase verapamil to 240 mg once daily  Labwork: - none  Procedures/Testing: - none  Follow-Up: - Your physician recommends that you schedule a follow-up appointment in: 6 weeks with Chanetta Marshall, NP.  Any Additional Special Instructions Will Be Listed Below (If Applicable).

## 2015-09-11 NOTE — Progress Notes (Signed)
Patient Care Team: Kathyrn Lass, MD as PCP - General (Family Medicine) [name], MD as Consulting Physician (Gynecologic Oncology) Darlin Coco, MD as Consulting Physician (Cardiology) Jerene Bears, MD as Consulting Physician (Gastroenterology) Michael Boston, MD as Consulting Physician (General Surgery)   HPI  Yolanda Spears is a 81 y.o. female seen in followup pacemaker implanted 2007 with generator replacement 2013 for tachybradycardia syndrome with paroxysmal atrial fibrillation. She has been treated with Rythmol metoprolol and warfarin. She had a spell in August 2013 where she had transient difficulty speaking . It cleared spontaneously. Aspirin was added    Thanksgiving she developed food poisoning. Since then she has struggled significantly with only recently started to get back to her baseline. She continues with some dyspnea on exertion associated with palpitations.  She reminds me that she has struggled with diarrhea for some time dating back to some. There is also ongoing problems with fatigue and weakness.  It remains variable not withstanding the persistence of her atrial fibrillation dating to shortly after Thanksgiving left (see below)         Past Medical History  Diagnosis Date  . Pacemaker -Medtronic   . Hypertension   . Peripheral neuropathy ([place])   . High cholesterol   . Heart murmur   . Shortness of breath on exertion   . Atrial fibrillation ([place])   . History of radiation therapy   . Partial bowel obstruction (HCC) JAN/2013    recurrent  . Colon polyp 2011  . Osteopenia 02/2013    T score -1.4 FRAX 11%/2.1%  . Stroke [facility])     "small stroke & several TIA's occasional visual problems / and occasional speach problems  . Arthritis   . Personal history of skin cancer   . Urinary leakage   . Hx: UTI (urinary tract infection)   . Sleep apnea     unaable to tolerate mask   . Endometrial cancer ([place]) 2006    Stage IIIc  s/p radiation  . Breast  cancer ([place]) 1999    LOBULAR CARCINOMA IN SITU...  . Ringing in ears   . Orthostatic lightheadedness 11/27/2011  . Small bowel obstruction due to adhesions ([place]) 12/28/2013  . Hx of echocardiogram     Echo (02/2014):  EF 55-60%, no RWMA, mild AI, mild MR, mild LAE, PASP 44 mmHg  . Tubular adenoma of colon   . Internal hemorrhoids   . Recurrent Clostridium difficile diarrhea     Past Surgical History  Procedure Laterality Date  . Lymph node resection  2007  . Cholecystectomy  1990's  . Mastectomy  1999    bilaterally w/lymph node dissection  . Abdominal hysterectomy  2006    BSO  . Oophorectomy      BSO  . Exploratory laparotomy  2007    remove abdominal cyst  . Insert / replace / remove pacemaker  2007    initial placement  2007 / REPLACED 2014     . Laparoscopic lysis intestinal adhesions  12/28/2013  . Abdominal adhesion surgery  12/28/2013  . Small intestine surgery  12/28/2013  . Laparoscopic lysis of adhesions N/A 12/28/2013    Procedure: LAPAROSCOPIC LYSIS OF ADHESIONS converted OPEN SMALL BOWEL;  Surgeon: Adin Hector, MD;  Location: [facility];  Service: General;  Laterality: N/A;  . Permanent pacemaker generator change N/A 03/23/2012    Procedure: PERMANENT PACEMAKER GENERATOR CHANGE;  Surgeon: Deboraha Sprang, MD;  Location: [facility] CATH LAB;  Service: Cardiovascular;  Laterality: N/A;  .  Colonoscopy with propofol N/A 12/27/2014    Procedure: COLONOSCOPY WITH PROPOFOL;  Surgeon: Jerene Bears, MD;  [facility] ENDOSCOPY;  Service: Gastroenterology;  Laterality: N/A;  . Fecal transplant N/A 12/27/2014    Procedure: FECAL TRANSPLANT;  Surgeon: Jerene Bears, MD;  Location: [facility] ENDOSCOPY;  Service: Gastroenterology;  Laterality: N/A;    Current Outpatient Prescriptions  Medication Sig Dispense Refill  . calcium carbonate (TUMS - DOSED IN MG ELEMENTAL CALCIUM) 500 MG chewable tablet Chew 1 tablet by mouth daily.    . Cholecalciferol (VITAMIN D-3) 5000 UNITS TABS Take 1 tablet by mouth 4  (four) times a week. Takes on Saturday, Sunday, Tuesday and Thursday    . furosemide (LASIX) 40 MG tablet TAKE 1 TABLET BY MOUTH EVERY DAY 90 tablet 2  . hydrALAZINE (APRESOLINE) 25 MG tablet Take 25 mg by mouth 2 (two) times daily.    Marland Kitchen KLOR-CON M10 10 MEQ tablet TAKE 1 TABLET (10 MEQ TOTAL) BY MOUTH DAILY. 30 tablet 9  . lisinopril (PRINIVIL,ZESTRIL) 10 MG tablet Take 1 tablet (10 mg total) by mouth 2 (two) times daily. 180 tablet 3  . mirabegron ER (MYRBETRIQ) 50 MG TB24 tablet Take 50 mg by mouth daily.    . pregabalin (LYRICA) 75 MG capsule Take 75 mg by mouth 2 (two) times daily.    . propafenone (RYTHMOL) 225 MG tablet Take one tablet by mouth twice daily 180 tablet 1  . warfarin (COUMADIN) 2.5 MG tablet Take as directed by coumadin clinic 30 tablet 3  . ZETIA 10 MG tablet TAKE 1/2 TABLET BY MOUTH EVERY DAY 15 tablet 9  . verapamil (CALAN-SR) 240 MG CR tablet Take 1/2 tablet (120 mg) by mouth once daily x 7 days, then take one tablet (240 mg) by mouth once daily 30 tablet 6   No current facility-administered medications for this visit.    Allergies  Allergen Reactions  . Other Hives    Contrast dye   . Penicillins Itching  . Shellfish-Derived Products Nausea And Vomiting  . Statins Other (See Comments)    Myalgias,elevated LFT studies  . Iohexol Hives and Itching    Itching in eyes, one hive on face.    Review of Systems negative except from HPI and PMH  Physical Exam BP 134/74 mmHg  Ht 5' 6.5" (1.689 m)  Wt 167 lb (75.751 kg)  BMI 26.55 kg/m2 Well developed and well nourished in no acute distress HENT normal E scleral and icterus clear Neck Supple JVP flat; carotids brisk and full Clear to ausculation Device pocket well healed; without hematoma or erythema.  There is no tethering Regular rate and rhythm, no murmurs gallops or rub Soft with active bowel sounds No clubbing cyanosis Trace and non-pitting Edema Alert and oriented, grossly normal motor and sensory  function Skin Warm and Dry      Assessment and  Plan  Sinus node dysfunction  First degree AV block  Pacemaker-Medtronic The patient's device was interrogated.  The information was reviewed. No changes were made in the programming.    Atrial fibrillation-persistent  Prior stroke  diarrhea   She is now persistent atrial fibrillation perhaps typically triggered by her poisoning problem. In any case there have been episodes of sinus rhythm interspersed over the last couple of months highlighting the lack of effectiveness of her propafenone. She is however reluctant to discontinue it, admittedly for her own psychological security.  I wonder also whether some of her fatigue or her diarrhea may not be  related to her beta blockers. Hence, we will discontinue her metoprolol gradually over the next couple of weeks and replace it with verapamil.  In 6 or 8 weeks, we will review her response to this change. If she is willing at that time, I would discontinue the propafenone and use flecainide in its stead anticipating that her paroxysms of sinus rhythm would continue and hopefully the flecainide will allow it to maintain  More than 50% of 45 min was spent in counseling related to the above

## 2015-09-17 ENCOUNTER — Encounter: Payer: Medicare Other | Admitting: *Deleted

## 2015-09-17 ENCOUNTER — Telehealth: Payer: Self-pay | Admitting: Cardiology

## 2015-09-17 MED ORDER — PROPAFENONE HCL 225 MG PO TABS: ORAL_TABLET | ORAL | Status: DC

## 2015-09-17 NOTE — Telephone Encounter (Signed)
Per Dr. Caryl Comes, he called and spoke with the patient today. She will increase propafenone to TID and follow up with him in 3-4 weeks.

## 2015-09-17 NOTE — Telephone Encounter (Signed)
Called pt to remind her of her remote appointment for today. Pt was just seen in office on 09-11-15, so I informed pt that she would not need to send remote transmission today. Pt was in office on 06-17-16 seeing MD he had her decrease her propafenone (RYTHMOL) 225 MG tablet from 1 tablet every 8 hours to twice daily. Pt wants to know if this could be the reason why she has had an increase in atrial fibrillation. Informed pt that I wold send MD a note and call her back once I received an answer. Pt verbalized understanding.

## 2015-09-17 NOTE — Addendum Note (Signed)
Addended by: Alvis Lemmings C on: 09/17/2015 06:19 PM   Modules accepted: Orders, Medications

## 2015-09-19 ENCOUNTER — Ambulatory Visit: Payer: Medicare Other | Admitting: Neurology

## 2015-10-01 ENCOUNTER — Ambulatory Visit: Payer: Medicare Other | Admitting: Nurse Practitioner

## 2015-10-01 ENCOUNTER — Ambulatory Visit (INDEPENDENT_AMBULATORY_CARE_PROVIDER_SITE_OTHER): Payer: Medicare Other | Admitting: *Deleted

## 2015-10-01 DIAGNOSIS — Z5181 Encounter for therapeutic drug level monitoring: Secondary | ICD-10-CM

## 2015-10-01 DIAGNOSIS — I481 Persistent atrial fibrillation: Secondary | ICD-10-CM

## 2015-10-01 DIAGNOSIS — I4891 Unspecified atrial fibrillation: Secondary | ICD-10-CM | POA: Diagnosis not present

## 2015-10-01 DIAGNOSIS — I4819 Other persistent atrial fibrillation: Secondary | ICD-10-CM

## 2015-10-01 LAB — CUP PACEART INCLINIC DEVICE CHECK
Battery Voltage: 2.78 V
Brady Statistic AS VP Percent: 23 %
Date Time Interrogation Session: 20170104173852
Implantable Lead Implant Date: 20071017
Implantable Lead Location: 753859
Implantable Lead Location: 753860
Implantable Lead Model: 4092
Lead Channel Pacing Threshold Pulse Width: 0.4 ms
Lead Channel Sensing Intrinsic Amplitude: 1.4 mV
Lead Channel Setting Pacing Pulse Width: 0.4 ms
MDC IDC LEAD IMPLANT DT: 20071017
MDC IDC MSMT BATTERY IMPEDANCE: 228 Ohm
MDC IDC MSMT BATTERY REMAINING LONGEVITY: 84 mo
MDC IDC MSMT LEADCHNL RA IMPEDANCE VALUE: 429 Ohm
MDC IDC MSMT LEADCHNL RV IMPEDANCE VALUE: 484 Ohm
MDC IDC MSMT LEADCHNL RV PACING THRESHOLD AMPLITUDE: 0.75 V
MDC IDC MSMT LEADCHNL RV SENSING INTR AMPL: 8 mV
MDC IDC SET LEADCHNL RA PACING AMPLITUDE: 2 V
MDC IDC SET LEADCHNL RV PACING AMPLITUDE: 2.5 V
MDC IDC SET LEADCHNL RV SENSING SENSITIVITY: 2.8 mV
MDC IDC STAT BRADY AP VP PERCENT: 75 %
MDC IDC STAT BRADY AP VS PERCENT: 0 %
MDC IDC STAT BRADY AS VS PERCENT: 2 %

## 2015-10-01 LAB — POCT INR: INR: 2.5

## 2015-10-03 ENCOUNTER — Other Ambulatory Visit: Payer: Self-pay | Admitting: Cardiology

## 2015-10-06 ENCOUNTER — Other Ambulatory Visit: Payer: Self-pay | Admitting: Cardiology

## 2015-10-08 ENCOUNTER — Ambulatory Visit (INDEPENDENT_AMBULATORY_CARE_PROVIDER_SITE_OTHER): Payer: Medicare Other | Admitting: Neurology

## 2015-10-08 DIAGNOSIS — R531 Weakness: Secondary | ICD-10-CM

## 2015-10-08 DIAGNOSIS — R410 Disorientation, unspecified: Secondary | ICD-10-CM

## 2015-10-08 DIAGNOSIS — G459 Transient cerebral ischemic attack, unspecified: Secondary | ICD-10-CM

## 2015-10-10 ENCOUNTER — Telehealth: Payer: Self-pay | Admitting: Internal Medicine

## 2015-10-10 NOTE — Telephone Encounter (Signed)
New message   Pt calling to speak to dr Shelly Bombard about a medication update  Ezepimibe 10mg 

## 2015-10-10 NOTE — Procedures (Signed)
   HISTORY: 81 years old female, with recurrent episodes of right hand weakness, word finding difficulties and mild confusion  TECHNIQUE:  16 channel EEG was performed based on standard 10-16 international system. One channel was dedicated to EKG, which has demonstrates normal sinus rhythm of 60 beats per minutes.  Upon awakening, the posterior background activity was well-developed, in alpha range, 9 Hz,reactive to eye opening and closure.  There was no evidence of epileptiform discharge.  Photic stimulation was performed, which induced a symmetric photic driving.  Hyperventilation was performed, there was no abnormality elicit.  No sleep was achieved.  CONCLUSION: This is a  normal awake EEG.  There is no electrodiagnostic evidence of epileptiform discharge

## 2015-10-10 NOTE — Telephone Encounter (Signed)
Yolanda Spears,  I spoke with this patient today about her Zetia. She said we needed to do some type of letter for her Zetia to [employer] stating that she "has" to have this medication and that she can have generic. She has been seeing Dr. Mare Ferrari- do you recall doing some type of letter for her or was this just a Prior Auth you think? She called Dr. Caryl Comes about this since Dr. Mare Ferrari is retiring, but he hardly ever puts anyone on Zetia (just lets general cardiology do this). Just let me know your thoughts.  Thanks! Nira Conn

## 2015-10-14 MED ORDER — EZETIMIBE 10 MG PO TABS: ORAL_TABLET | ORAL | Status: AC

## 2015-10-14 NOTE — Telephone Encounter (Signed)
Spoke with patient and I think she needs Rx sent to pharmacy for generic Zetia Advised patient would send Rx to pharmacy and if PA needed they should notify office Rx sent to pharmacy

## 2015-10-16 ENCOUNTER — Ambulatory Visit (INDEPENDENT_AMBULATORY_CARE_PROVIDER_SITE_OTHER): Payer: Medicare Other | Admitting: Neurology

## 2015-10-16 ENCOUNTER — Encounter: Payer: Self-pay | Admitting: Neurology

## 2015-10-16 VITALS — BP 128/60 | HR 68 | Ht 66.5 in | Wt 161.0 lb

## 2015-10-16 DIAGNOSIS — G459 Transient cerebral ischemic attack, unspecified: Secondary | ICD-10-CM

## 2015-10-16 DIAGNOSIS — I480 Paroxysmal atrial fibrillation: Secondary | ICD-10-CM

## 2015-10-16 DIAGNOSIS — G629 Polyneuropathy, unspecified: Secondary | ICD-10-CM | POA: Diagnosis not present

## 2015-10-16 NOTE — Progress Notes (Signed)
Chief Complaint  Patient presents with  . Transient Ischemic Attack    She is here to discuss her EEG results. Denies any further events.      PATIENT: Yolanda Spears DOB: [birth year]  Chief Complaint  Patient presents with  . Transient Ischemic Attack    She is here to discuss her EEG results. Denies any further events.     HISTORICAL  Yolanda Spears, is a 81 years old right-handed female accompanied by her caregiver Cecille Rubin to follow-up her most recent hospital admission in June 20 second 2016, I saw her previously most recent visit was September 2011  She presented with 3 episode of transient loss control of her right hand, right arm, lasting for a few minutes, could not handle the zipper on her purse, no loss of consciousness, similar episode in June 18, June 19, June 22.  She had past medical history of atrial fibrillation, on Coumadin treatment, most recent INR was 1.8,  Not a candidate for MRI due to previous pacemaker placement,   I have personally reviewed CAT scan of the brain in June 2016, generalized atrophy, periventricular small vessel disease, more on the left paraventricular region.   Ultrasound of carotid artery, right verterbral artery was retrograde, left vertebral artery is anterograde, bilateral internal carotid artery 1-39% stenosis. 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 fib on warfarin, hypertension, recurrent UTI, and ongoing issues with chronic  diarrhea  She also had a history of chronic gait difficulty, bilateral feet paresthesia to below knee level,  Our office saw her previously because hospital admission in July 2012 for word finding difficulties, mild confusion, lasting for 45 minutes,   Per record CT scan of the head without contrast in 2012 showed generalized  atrophy with small-vessel chronic ischemic changes of deep cerebral white matter. Tiny old lacunar infarct, right thalamus. No acute intracranial abnormalities.   CTA of the head and neck revealed 30% stenosis of proximal left subclavian artery. Atherosclerotic disease at both carotid bifurcations but without narrowing of the ICA lumen. The left vertebral artery is a large vessel, widely patent. 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 Enlargement of the thyroid with multiple nodules likely represents multinodular goiter. Atherosclerotic calcification in both carotid siphons with narrowing, estimated at 50% bilaterally. Stenosis at both supraclinoid ICA region 50-70% on the right and 30-50% on the left.   2D echo revealed the left ventricular cavity size was normal, systolic function was normal, EF was 55-60%. wall motion was normal.  She also has obstructive sleep apnea, has CPAP machine, but not using it since 2008, could no tolerate it.  She had a history of right onset bilateral lower extremity paresthesia, was diagnosed with peripheral neuropathy, she is taking lyrica, extensive evaluation in the past by outside neurologist, she reported no etiology was found, her neuropathy was confirmed by EMG nerve conduction study, she does not want to have a repeat evaluation again, .  She complains of bilateral feet burning numbing sensation, especially  at nighttime, and when she first got up in the morning time, difficulty bearing weight.  UPDATE Feb 8th 2017: She has no recurrent episodes of  right hand weakness, she has trouble with word finding sometimes, she lives alone, she uses walker, worsening bilateral lower extremity paresthesia, gait difficulty, she has trouble sleeping,       REVIEW OF SYSTEMS: Full 14 system review of systems performed and notable only for as above   ALLERGIES: Allergies  Allergen Reactions  . Other Hives    Contrast dye   . Penicillins Itching  . Shellfish-Derived Products Nausea And Vomiting  . Statins Other (See Comments)    Myalgias,elevated LFT studies  . Iohexol Hives and Itching    Itching in eyes, one hive on face.    HOME MEDICATIONS: Current Outpatient Prescriptions  Medication Sig Dispense Refill  . calcium carbonate (TUMS - DOSED IN MG ELEMENTAL CALCIUM) 500 MG chewable tablet Chew 1 tablet by mouth daily.    . Cholecalciferol (VITAMIN D-3) 5000 UNITS TABS Take 1 tablet by mouth 4 (four) times a week. Takes on Saturday, Sunday, Tuesday and Thursday    . ezetimibe (ZETIA) 10 MG tablet 1/2 tablet by mouth daily 45 tablet 1  . furosemide (LASIX) 40 MG tablet TAKE 1 TABLET BY MOUTH EVERY DAY 90 tablet 2  . hydrALAZINE (APRESOLINE) 25 MG tablet Take 25 mg by mouth 2 (two) times daily.    Marland Kitchen KLOR-CON M10 10 MEQ tablet TAKE 1 TABLET (10 MEQ TOTAL) BY MOUTH DAILY. 30 tablet 9  . lisinopril (PRINIVIL,ZESTRIL) 10 MG tablet TAKE 1 TABLET (10 MG TOTAL) BY MOUTH 2 (TWO) TIMES DAILY. 180 tablet 0  . mirabegron ER (MYRBETRIQ) 50 MG TB24 tablet Take 50 mg by mouth daily.    . pregabalin (LYRICA) 75 MG capsule Take 75 mg by mouth 2 (two) times daily.    . propafenone (RYTHMOL) 225 MG tablet Take one tablet (225 mg) by mouth three times daily    . verapamil (CALAN-SR) 240 MG CR tablet Take 1/2 tablet (120 mg) by mouth once daily x 7 days, then take one tablet (240 mg) by mouth once daily 30 tablet 6  . warfarin (COUMADIN) 2.5 MG tablet Take as directed by coumadin clinic 30 tablet 3   No current facility-administered medications for this visit.     PAST MEDICAL HISTORY: Past Medical History  Diagnosis Date  . Pacemaker -Medtronic   . Hypertension   . Peripheral neuropathy ([place])   . High cholesterol   . Heart murmur   . Shortness of breath on exertion   . Atrial fibrillation ([place])   . History of radiation therapy   . Partial bowel obstruction (HCC) JAN/2013    recurrent  . Colon polyp 2011  . Osteopenia 02/2013    T score -1.4 FRAX 11%/2.1%  . Stroke [facility])     "small stroke & several TIA's occasional visual problems / and occasional speach problems  . Arthritis   . Personal history of skin cancer   . Urinary leakage   . Hx: UTI (urinary tract infection)   . Sleep apnea     unaable to tolerate mask   . Endometrial cancer ([place]) 2006    Stage IIIc  s/p radiation  . Breast cancer ([place]) 1999    LOBULAR CARCINOMA IN SITU...  . Ringing in ears   . Orthostatic lightheadedness 11/27/2011  . Small bowel obstruction due to adhesions ([place]) 12/28/2013  . Hx of echocardiogram  Echo (02/2014):  EF 55-60%, no RWMA, mild AI, mild MR, mild LAE, PASP 44 mmHg  . Tubular adenoma of colon   . Internal hemorrhoids   . Recurrent Clostridium difficile diarrhea     PAST SURGICAL HISTORY: Past Surgical History  Procedure Laterality Date  . Lymph node resection  2007  . Cholecystectomy  1990's  . Mastectomy  1999    bilaterally w/lymph node dissection  . Abdominal hysterectomy  2006    BSO  . Oophorectomy      BSO  . Exploratory laparotomy  2007    remove abdominal cyst  . Insert / replace / remove pacemaker  2007    initial placement  2007 / REPLACED 2014     . Laparoscopic lysis intestinal adhesions  12/28/2013  . Abdominal adhesion surgery  12/28/2013  . Small intestine surgery  12/28/2013  . Laparoscopic lysis of adhesions N/A 12/28/2013    Procedure: LAPAROSCOPIC LYSIS OF ADHESIONS converted OPEN SMALL BOWEL;  Surgeon: Adin Hector, MD;  Location: [facility] OR[facility];  Service: General;  Laterality: N/A;  . Permanent pacemaker  generator change N/A 03/23/2012    Procedure: PERMANENT PACEMAKER GENERATOR CHANGE;  Surgeon: Deboraha Sprang, MD;  Location: [facility] CATH LAB;  Service: Cardiovascular;  Laterality: N/A;  . Colonoscopy with propofol N/A 12/27/2014    Procedure: COLONOSCOPY WITH PROPOFOL;  Surgeon: Jerene Bears, MD;  [facility] ENDOSCOPY;  Service: Gastroenterology;  Laterality: N/A;  . Fecal transplant N/A 12/27/2014    Procedure: FECAL TRANSPLANT;  Surgeon: Jerene Bears, MD;  Location: [facility] ENDOSCOPY;  Service: Gastroenterology;  Laterality: N/A;    FAMILY HISTORY: Family History  Problem Relation Age of Onset  . Cancer Mother 40    UTERINE  . Breast cancer Mother 36  . Hypertension Mother   . Uterine cancer Mother   . Cancer Father     PROSTATE  . Hypertension Father   . Colon cancer Neg Hx   . Esophageal cancer Neg Hx   . Rectal cancer Neg Hx   . Stomach cancer Neg Hx     SOCIAL HISTORY:  Social History   Social History  . Marital Status: Widowed    Spouse Name: N/A  . Number of Children: 2  . Years of Education: N/A   Occupational History  . Retired    Social History Main Topics  . Smoking status: Former Smoker -- 1.00 packs/day for 40 years    Types: Cigarettes    Quit date: 11/26/1998  . Smokeless tobacco: Never Used  . Alcohol Use: No     Comment: 09/14/11 "last drink ~ 08/31/2009"  . Drug Use: No  . Sexual Activity: No   Other Topics Concern  . Not on file   Social History Narrative   Widowed mother of 2     PHYSICAL EXAM   Filed Vitals:   10/16/15 1609  BP: 128/60  Pulse: 68  Height: 5' 6.5" (1.689 m)  Weight: 161 lb (73.029 kg)    Not recorded      Body mass index is 25.6 kg/(m^2).  PHYSICAL EXAMNIATION:  Gen: NAD, conversant, well nourised, obese, well groomed                     Cardiovascular: Regular rate rhythm, no peripheral edema, warm, nontender. Eyes: Conjunctivae clear without exudates or hemorrhage Neck: Supple, no carotid bruise. Pulmonary:  Clear to auscultation bilaterally   NEUROLOGICAL EXAM:  MENTAL STATUS: Speech:    Speech  is normal; fluent and spontaneous with normal comprehension.  Cognition:     Orientation to time, place and person     Normal recent and remote memory     Normal Attention span and concentration     Normal Language, naming, repeating,spontaneous speech     Fund of knowledge   CRANIAL NERVES: CN II: Visual fields are full to confrontation. Fundoscopic exam is normal with sharp discs and no vascular changes. Pupils are round equal and briskly reactive to light. CN III, IV, VI: extraocular movement are normal. No ptosis. CN V: Facial sensation is intact to pinprick in all 3 divisions bilaterally. Corneal responses are intact.  CN VII: Face is symmetric with normal eye closure and smile. CN VIII: Hearing is normal to rubbing fingers CN IX, X: Palate elevates symmetrically. Phonation is normal. CN XI: Head turning and shoulder shrug are intact CN XII: Tongue is midline with normal movements and no atrophy.  MOTOR: Muscle bulk and tone are normal. she has mild bilateral ankle dorsiflexion weakness.  REFLEXES: Reflexes are1 and symmetric at the biceps, triceps, knees, and  absent at ankles. Plantar responses are flexor.  SENSORY: Length dependent decreased to light touch, vibratory sensation to knee level   Coordination: Rapid alternating movements and fine finger movements are intact. There is no dysmetria on finger-to-nose and heel-knee-shin.    GAIT/STANCE: She needs assistance to get up from seated position, bilateral foot drop, cautious, unsteady, wide-based gait.    DIAGNOSTIC DATA (LABS, IMAGING, TESTING) - I reviewed patient records, labs, notes, testing and imaging myself where available.   ASSESSMENT AND PLAN  ROZELLA DUTROW is a 81 y.o. female    Recurrent episodes of right hand weakness, previous history of word finding difficulty, mild confusion,  Localized the problem to  left frontal region  Differentiation diagnosis including partial seizure versus TIA   EEG showed no significant abnormality  She is hesitate to be empirically treated with antiepileptic medications. Continue document events,   Atrial fibrillation:  On chronic Coumadin treatment.  Gait difficulty:   Distal weakness, length dependent sensory changes   Differentiation diagnosis, lumbosacral radiculopathy, peripheral neuropathy   If she has worsening gait difficulty, ascending paresthesia, may consider CT of lumbar spine, repeat EMG nerve conduction study.   Marcial Pacas, M.D. Ph.D.  [facility] [address] [place], [place] [address] Ph: [phone] Fax: [phone]  CC: Referring Provider

## 2015-10-23 ENCOUNTER — Ambulatory Visit (INDEPENDENT_AMBULATORY_CARE_PROVIDER_SITE_OTHER): Payer: Medicare Other | Admitting: Nurse Practitioner

## 2015-10-23 ENCOUNTER — Encounter: Payer: Self-pay | Admitting: Internal Medicine

## 2015-10-23 ENCOUNTER — Encounter: Payer: Self-pay | Admitting: Nurse Practitioner

## 2015-10-23 ENCOUNTER — Ambulatory Visit: Payer: Medicare Other | Admitting: Podiatry

## 2015-10-23 VITALS — BP 115/50 | HR 130 | Ht 66.5 in | Wt 166.0 lb

## 2015-10-23 DIAGNOSIS — I480 Paroxysmal atrial fibrillation: Secondary | ICD-10-CM | POA: Diagnosis not present

## 2015-10-23 DIAGNOSIS — I481 Persistent atrial fibrillation: Secondary | ICD-10-CM | POA: Diagnosis not present

## 2015-10-23 DIAGNOSIS — I441 Atrioventricular block, second degree: Secondary | ICD-10-CM

## 2015-10-23 DIAGNOSIS — I4819 Other persistent atrial fibrillation: Secondary | ICD-10-CM

## 2015-10-23 LAB — CUP PACEART INCLINIC DEVICE CHECK
Date Time Interrogation Session: 20170215150808
Implantable Lead Location: 753859
Implantable Lead Model: 4092
Implantable Lead Model: 5076
MDC IDC LEAD IMPLANT DT: 20071017
MDC IDC LEAD IMPLANT DT: 20071017
MDC IDC LEAD LOCATION: 753860
MDC IDC SET LEADCHNL RA PACING AMPLITUDE: 2 V
MDC IDC SET LEADCHNL RV PACING AMPLITUDE: 2.5 V
MDC IDC SET LEADCHNL RV PACING PULSEWIDTH: 0.4 ms
MDC IDC SET LEADCHNL RV SENSING SENSITIVITY: 2.8 mV

## 2015-10-23 LAB — CBC
HCT: 33 % — ABNORMAL LOW (ref 36.0–46.0)
Hemoglobin: 10.5 g/dL — ABNORMAL LOW (ref 12.0–15.0)
MCH: 29.3 pg (ref 26.0–34.0)
MCHC: 31.8 g/dL (ref 30.0–36.0)
MCV: 92.2 fL (ref 78.0–100.0)
MPV: 11.4 fL (ref 8.6–12.4)
Platelets: 263 10*3/uL (ref 150–400)
RBC: 3.58 MIL/uL — ABNORMAL LOW (ref 3.87–5.11)
RDW: 14.3 % (ref 11.5–15.5)
WBC: 6.8 10*3/uL (ref 4.0–10.5)

## 2015-10-23 MED ORDER — FLECAINIDE ACETATE 100 MG PO TABS: 100.0000 mg | ORAL_TABLET | Freq: Two times a day (BID) | ORAL | Status: DC

## 2015-10-23 NOTE — Patient Instructions (Addendum)
Medication Instructions:   STOP RYTHMOL .Marland KitchenTHEN.. 3 DAYS AFTER STOPPING RYTHMOL  START FLECAINIDE 100 MG TWICE A DAY   If you need a refill on your cardiac medications before your next appointment, please call your pharmacy.  Labwork:  CBC AND BMET    Testing/Procedures:  NONE ORDER TODAY    Follow-Up:    IN 3 TO 4 WEEKS WITH SEILER   NURSE VISIT  WITH EKG FOR NEW MEDICATION CHANGE  WEEK OF  11/04/15  Any Other Special Instructions Will Be Listed Below (If Applicable).

## 2015-10-23 NOTE — Progress Notes (Signed)
Electrophysiology Office Note Date: 10/23/2015  ID:  Yolanda Spears, DOB [birth year], MRN [id number]  PCP: Tawanna Solo, MD Primary Cardiologist: Mare Ferrari Electrophysiologist: Caryl Comes  CC: Atrial fibrillation follow-up  Yolanda Spears is a 81 y.o. female seen today for Dr Caryl Comes.  She presents today for routine electrophysiology followup of atrial fibrillation.  Since last being seen in our clinic, the patient reports doing reasonably well.  She remains in atrial fibrillation today with significantly increased burden since November by device interrogation.  She reports fatigue and exercise intolerance. She denies chest pain, palpitations, dyspnea, PND, orthopnea, nausea, vomiting, dizziness, syncope, edema, weight gain, or early satiety. She has tolerated transitioning from Metoprolol to Verapamil.   Echo 02/2015 demonstrated EF 55-60%, no RWMA, grade 2 diastolic dysfunction, mild AS, LA 34  Device History: MDT dual chamber PPM implanted 2007 for AV block, gen change 2013   Past Medical History  Diagnosis Date  . Hypertension   . Peripheral neuropathy ([place])   . High cholesterol   . Persistent atrial fibrillation ([place])   . History of radiation therapy   . Partial bowel obstruction (HCC) JAN/2013    recurrent  . Colon polyp 2011  . Osteopenia 02/2013    T score -1.4 FRAX 11%/2.1%  . Stroke [facility])     "small stroke & several TIA's occasional visual problems / and occasional speach problems  . Arthritis   . Personal history of skin cancer   . Sleep apnea     unaable to tolerate mask   . Endometrial cancer ([place]) 2006    Stage IIIc  s/p radiation  . Breast cancer ([place]) 1999    LOBULAR CARCINOMA IN SITU...  . Small bowel obstruction due to adhesions ([place]) 12/28/2013  . Internal hemorrhoids   . Recurrent Clostridium difficile diarrhea   . Mobitz II     a. s/p MDT PPM    Past Surgical History  Procedure Laterality Date  . Lymph node resection  2007  . Cholecystectomy   1990's  . Mastectomy  1999    bilaterally w/lymph node dissection  . Abdominal hysterectomy  2006    BSO  . Oophorectomy      BSO  . Exploratory laparotomy  2007    remove abdominal cyst  . Insert / replace / remove pacemaker  2007    initial placement  2007 / REPLACED 2014     . Laparoscopic lysis intestinal adhesions  12/28/2013  . Abdominal adhesion surgery  12/28/2013  . Small intestine surgery  12/28/2013  . Laparoscopic lysis of adhesions N/A 12/28/2013    Procedure: LAPAROSCOPIC LYSIS OF ADHESIONS converted OPEN SMALL BOWEL;  Surgeon: Adin Hector, MD;  [facility];  Service: General;  Laterality: N/A;  . Permanent pacemaker generator change N/A 03/23/2012    Procedure: PERMANENT PACEMAKER GENERATOR CHANGE;  Surgeon: Deboraha Sprang, MD;  Location: [facility] CATH LAB;  Service: Cardiovascular;  Laterality: N/A;  . Colonoscopy with propofol N/A 12/27/2014    Procedure: COLONOSCOPY WITH PROPOFOL;  Surgeon: Jerene Bears, MD;  [facility];  Service: Gastroenterology;  Laterality: N/A;  . Fecal transplant N/A 12/27/2014    Procedure: FECAL TRANSPLANT;  Surgeon: Jerene Bears, MD;  [facility]OS[facility];  Service: Gastroenterology;  Laterality: N/A;    Current Outpatient Prescriptions  Medication Sig Dispense Refill  . calcium carbonate (TUMS - DOSED IN MG ELEMENTAL CALCIUM) 500 MG chewable tablet Chew 1 tablet by mouth daily.    . Cholecalciferol (VITAMIN  D-3) 5000 UNITS TABS Take 1 tablet by mouth 4 (four) times a week. Takes on Saturday, Sunday, Tuesday and Thursday    . ezetimibe (ZETIA) 10 MG tablet 1/2 tablet by mouth daily 45 tablet 1  . furosemide (LASIX) 40 MG tablet TAKE 1 TABLET BY MOUTH EVERY DAY 90 tablet 2  . hydrALAZINE (APRESOLINE) 25 MG tablet Take 25 mg by mouth 2 (two) times daily.    Marland Kitchen KLOR-CON M10 10 MEQ tablet TAKE 1 TABLET (10 MEQ TOTAL) BY MOUTH DAILY. 30 tablet 9  . lisinopril (PRINIVIL,ZESTRIL) 10 MG tablet TAKE 1 TABLET (10 MG TOTAL) BY MOUTH 2 (TWO)  TIMES DAILY. 180 tablet 0  . mirabegron ER (MYRBETRIQ) 50 MG TB24 tablet Take 50 mg by mouth daily.    . pregabalin (LYRICA) 75 MG capsule Take 75 mg by mouth 2 (two) times daily.    . propafenone (RYTHMOL) 225 MG tablet Take one tablet (225 mg) by mouth three times daily    . verapamil (CALAN-SR) 240 MG CR tablet Take 1/2 tablet (120 mg) by mouth once daily x 7 days, then take one tablet (240 mg) by mouth once daily 30 tablet 6  . warfarin (COUMADIN) 2.5 MG tablet Take as directed by coumadin clinic 30 tablet 3   No current facility-administered medications for this visit.    Allergies:   Other; Penicillins; Shellfish-derived products; Statins; and Iohexol   Social History: Social History   Social History  . Marital Status: Widowed    Spouse Name: N/A  . Number of Children: 2  . Years of Education: N/A   Occupational History  . Retired    Social History Main Topics  . Smoking status: Former Smoker -- 1.00 packs/day for 40 years    Types: Cigarettes    Quit date: 11/26/1998  . Smokeless tobacco: Never Used  . Alcohol Use: No     Comment: 09/14/11 "last drink ~ 08/31/2009"  . Drug Use: No  . Sexual Activity: No   Other Topics Concern  . Not on file   Social History Narrative   Widowed mother of 2    Family History: Family History  Problem Relation Age of Onset  . Cancer Mother 77    UTERINE  . Breast cancer Mother 36  . Hypertension Mother   . Uterine cancer Mother   . Cancer Father     PROSTATE  . Hypertension Father   . Colon cancer Neg Hx   . Esophageal cancer Neg Hx   . Rectal cancer Neg Hx   . Stomach cancer Neg Hx      Review of Systems: All other systems reviewed and are otherwise negative except as noted above.   Physical Exam: VS:  BP 115/50 mmHg  Pulse 130  Ht 5' 6.5" (1.689 m)  Wt 166 lb (75.297 kg)  BMI 26.39 kg/m2 , BMI Body mass index is 26.39 kg/(m^2).  GEN- The patient is elderly and obese appearing, alert and oriented x 3 today.     HEENT: normocephalic, atraumatic; sclera clear, conjunctiva pink; hearing intact; oropharynx clear; neck supple  Lungs- Clear to ausculation bilaterally, normal work of breathing.  No wheezes, rales, rhonchi Heart- Regular rate and rhythm (paced) GI- soft, non-tender, non-distended, bowel sounds present  Extremities- no clubbing, cyanosis, or edema  MS- no significant deformity or atrophy Skin- warm and dry, no rash or lesion; PPM pocket well healed Psych- euthymic mood, full affect Neuro- strength and sensation are intact  PPM Interrogation- reviewed in  detail today,  See PACEART report  EKG:  EKG is ordered today. The ekg ordered today shows atrial fibrillation with ventricular pacing   Recent Labs: 02/25/2015: ALT 17 02/27/2015: BUN 20; Creatinine, Ser 1.47*; Hemoglobin 9.5*; Platelets 172; Potassium 3.4*; Sodium 141 06/18/2015: TSH 0.013*   Wt Readings from Last 3 Encounters:  10/23/15 166 lb (75.297 kg)  10/16/15 161 lb (73.029 kg)  09/11/15 167 lb (75.751 kg)     Other studies Reviewed: Additional studies/ records that were reviewed today include: Dr Olin Pia office notes   Assessment and Plan:  1.  Persistent atrial fibrillation The patient has persistent atrial fibrillation despite Rhythmol therapy Per Dr Olin Pia recommendations, will discontinue Rhythmol and start Flecainide 100mg  twice daily 3 days later.  Continue Warfarin for CHADS2VASC of 5 Follow up with EKG 1 week after starting Flecainide Follow up with me in 4 weeks. If still in AF, will need DCCV at that time   2.  Mobitz II  Normal PPM function See Pace Art report No changes today   Current medicines are reviewed at length with the patient today.   The patient does not have concerns regarding her medicines.  The following changes were made today:  Stop Rhythmol and start Flecainide 100mg  twice daily 3 days later  Labs/ tests ordered today include: none   Disposition:   Follow up with me in 4 weeks       Signed, Chanetta Marshall, NP 10/23/2015 11:42 AM  [facility] [place] [address] [phone] (office) [phone] (fax)

## 2015-10-24 ENCOUNTER — Other Ambulatory Visit (INDEPENDENT_AMBULATORY_CARE_PROVIDER_SITE_OTHER): Payer: Medicare Other | Admitting: *Deleted

## 2015-10-24 ENCOUNTER — Telehealth: Payer: Self-pay | Admitting: Nurse Practitioner

## 2015-10-24 ENCOUNTER — Other Ambulatory Visit: Payer: Self-pay | Admitting: *Deleted

## 2015-10-24 DIAGNOSIS — N289 Disorder of kidney and ureter, unspecified: Secondary | ICD-10-CM

## 2015-10-24 LAB — BASIC METABOLIC PANEL
BUN: 40 mg/dL — ABNORMAL HIGH (ref 7–25)
CALCIUM: 9 mg/dL (ref 8.6–10.4)
CO2: 21 mmol/L (ref 20–31)
Chloride: 104 mmol/L (ref 98–110)
Creat: 2.66 mg/dL — ABNORMAL HIGH (ref 0.60–0.93)
Glucose, Bld: 73 mg/dL (ref 65–99)
Potassium: 4.9 mmol/L (ref 3.5–5.3)
SODIUM: 138 mmol/L (ref 135–146)

## 2015-10-24 NOTE — Telephone Encounter (Signed)
I called and spoke with the patient.  Yolanda Spears was called this morning by Claiborne Billings about labs Yolanda Spears had done for [employer]. Yolanda Spears needs to come today for labs instead of tomorrow. Yolanda Spears was concerned that the myrbetiq that Yolanda Spears take (for overactive bladder) could be causing some issues with her kidneys. I advised her I wasn't familiar with the drug, but would review with Luetta Nutting, NP and call her back. Yolanda Spears is agreeable.

## 2015-10-24 NOTE — Telephone Encounter (Signed)
Message received back from Chanetta Marshall, NP. She reports that she did not think the myrbetiq would have any affect on the kidneys, but that there is renal dosing for this medication, so she should hold it for now. I have called the patient and notified her of this. She voices understanding.  BMP has not resulted at this time.

## 2015-10-24 NOTE — Telephone Encounter (Signed)
Please call,she needs to schedule lab work for this morning.Please put the order in asap.

## 2015-10-25 ENCOUNTER — Telehealth: Payer: Self-pay | Admitting: Internal Medicine

## 2015-10-25 ENCOUNTER — Other Ambulatory Visit: Payer: Self-pay

## 2015-10-25 DIAGNOSIS — I4891 Unspecified atrial fibrillation: Secondary | ICD-10-CM

## 2015-10-25 LAB — BASIC METABOLIC PANEL
BUN: 42 mg/dL — ABNORMAL HIGH (ref 7–25)
CALCIUM: 8.6 mg/dL (ref 8.6–10.4)
CO2: 25 mmol/L (ref 20–31)
Chloride: 109 mmol/L (ref 98–110)
Creat: 2.48 mg/dL — ABNORMAL HIGH (ref 0.60–0.93)
Glucose, Bld: 95 mg/dL (ref 65–99)
Potassium: 4.4 mmol/L (ref 3.5–5.3)
SODIUM: 134 mmol/L — AB (ref 135–146)

## 2015-10-25 NOTE — Telephone Encounter (Signed)
Advised that results have not been reviewed by Dr. Caryl Comes.  She is very anxious and wanted to know if possible to get results today.  Spoke w/Amber Seiler,NP who saw her in office.  Amber states she has sent Dr. Caryl Comes message to review.  She advises for her to continue to stay off of Lasix, Lisinopril and K+ and repeat BMET on Mon 2/20.  She verbalizes understanding and will come in [place] for lab. Advised will let her know if Dr. Caryl Comes has any further recommendations.

## 2015-10-25 NOTE — Telephone Encounter (Signed)
Pt would like her lab results from yesterday please. °

## 2015-10-28 ENCOUNTER — Other Ambulatory Visit (INDEPENDENT_AMBULATORY_CARE_PROVIDER_SITE_OTHER): Payer: Medicare Other | Admitting: *Deleted

## 2015-10-28 DIAGNOSIS — I4891 Unspecified atrial fibrillation: Secondary | ICD-10-CM | POA: Diagnosis not present

## 2015-10-29 ENCOUNTER — Telehealth: Payer: Self-pay | Admitting: Nurse Practitioner

## 2015-10-29 DIAGNOSIS — N289 Disorder of kidney and ureter, unspecified: Secondary | ICD-10-CM

## 2015-10-29 LAB — BASIC METABOLIC PANEL
BUN: 24 mg/dL (ref 7–25)
CALCIUM: 8.3 mg/dL — AB (ref 8.6–10.4)
CO2: 25 mmol/L (ref 20–31)
Chloride: 104 mmol/L (ref 98–110)
Creat: 1.88 mg/dL — ABNORMAL HIGH (ref 0.60–0.93)
Glucose, Bld: 80 mg/dL (ref 65–99)
POTASSIUM: 4.4 mmol/L (ref 3.5–5.3)
SODIUM: 138 mmol/L (ref 135–146)

## 2015-10-29 NOTE — Telephone Encounter (Signed)
She would like her lab results from yesterday please.

## 2015-10-29 NOTE — Telephone Encounter (Signed)
I spoke with the patient. A prelim report on her BMP was given to her. BUN/ Creatinine have improved. I advised the patient I will call her back on Thursday after reviewing with Dr. Caryl Comes. I advised her if she feels more SOB/ has edema, she can take her lasix 40 mg dose tomorrow if needed.

## 2015-10-30 ENCOUNTER — Encounter: Payer: Self-pay | Admitting: Podiatry

## 2015-10-30 ENCOUNTER — Ambulatory Visit (INDEPENDENT_AMBULATORY_CARE_PROVIDER_SITE_OTHER): Payer: Medicare Other | Admitting: Podiatry

## 2015-10-30 DIAGNOSIS — M79676 Pain in unspecified toe(s): Secondary | ICD-10-CM | POA: Diagnosis not present

## 2015-10-30 DIAGNOSIS — B351 Tinea unguium: Secondary | ICD-10-CM

## 2015-10-30 NOTE — Progress Notes (Signed)
Patient ID: Yolanda Spears, female   DOB: 01/27/1935, 81 y.o.   MRN: [id number]     Patient ID: Yolanda Spears, female DOB: July 02, 1935, 81 y.o. MRN: [id number]  Subjective: This patient presents today for scheduled visit complaining of painful toenails and requests nail debridement  Objective: No open skin lesions bilaterally The toenails are elongated, hypertrophic, deformed, discolored and tender direct palpation 6-10  Assessment: Symptomatic onychomycoses 6-10  Plan: Debridement toenails 10 mechanically and electrically.  Without any bleeding  Reappoint 3 months

## 2015-10-31 NOTE — Telephone Encounter (Signed)
Follow up     Pt c/o medication issue:  1. Name of Medication: lisinopril, furosemide, potassium  2. How are you currently taking this medication (dosage and times per day)? Pt stopped rx last week  3. Are you having a reaction (difficulty breathing--STAT)? no 4. What is your medication issue?  Medications were stopped last week.  Pt states she is retaining fluid--(pt does not weigh) and she is a little sob.  Should she restart the medications?

## 2015-10-31 NOTE — Telephone Encounter (Signed)
Dr. Caryl Comes spoke with the patient- restart lasix 40 mg once daily x 3 days, then take 40 mg one tablet every other day. Repeat BMP- early next week. She will need to follow up with with Richardson Dopp, PA in 2 weeks.  I have scheduled the patient to see Richardson Dopp, PA on 11/13/15 at 10:30 am as she already had a coumadin appointment at 11:00 am.  Yolanda Spears, can you call and let her know about the appointment please.

## 2015-10-31 NOTE — Progress Notes (Signed)
Discussed with pt  She is reaccumulating fluid SOB REsume fusroemide 40 mg X 3d Then will continue 40 qod  Will check BMET next week 2/28

## 2015-11-01 NOTE — Telephone Encounter (Signed)
Informed patient of appt w/ Richardson Dopp on 3/8 at 10:30, prior to Coumadin appt. Patient verbalized understanding and agreeable to plan.   She was asking if she should restart her Lisinopril.  Informed patient that I will send question back to Dr. Olin Pia nurse, Nira Conn, to address.

## 2015-11-04 ENCOUNTER — Other Ambulatory Visit: Payer: Self-pay | Admitting: *Deleted

## 2015-11-04 ENCOUNTER — Other Ambulatory Visit (INDEPENDENT_AMBULATORY_CARE_PROVIDER_SITE_OTHER): Payer: Medicare Other | Admitting: *Deleted

## 2015-11-04 ENCOUNTER — Ambulatory Visit (INDEPENDENT_AMBULATORY_CARE_PROVIDER_SITE_OTHER): Payer: Medicare Other | Admitting: *Deleted

## 2015-11-04 VITALS — BP 100/50 | HR 68 | Wt 161.2 lb

## 2015-11-04 DIAGNOSIS — I480 Paroxysmal atrial fibrillation: Secondary | ICD-10-CM

## 2015-11-04 DIAGNOSIS — N289 Disorder of kidney and ureter, unspecified: Secondary | ICD-10-CM

## 2015-11-04 LAB — BASIC METABOLIC PANEL
BUN: 34 mg/dL — ABNORMAL HIGH (ref 7–25)
CO2: 26 mmol/L (ref 20–31)
Calcium: 8.1 mg/dL — ABNORMAL LOW (ref 8.6–10.4)
Chloride: 104 mmol/L (ref 98–110)
Creat: 2.25 mg/dL — ABNORMAL HIGH (ref 0.60–0.93)
GLUCOSE: 101 mg/dL — AB (ref 65–99)
POTASSIUM: 4 mmol/L (ref 3.5–5.3)
SODIUM: 141 mmol/L (ref 135–146)

## 2015-11-04 NOTE — Telephone Encounter (Signed)
The patient is due for an EKG today to follow up on flecainide. She is also due for a repeat BMP. Will need to await the results of the BMP prior to deciding about resuming lisinopril.

## 2015-11-04 NOTE — Progress Notes (Signed)
1.) Reason for visit: EKG  2.) Name of MD requesting visit: Chanetta Marshall, NP  3.) H&P: The patient has a history of a-fib on propafenone. She was seen by Chanetta Marshall, NP on 10/23/15 for follow up for Dr. Caryl Comes. The plan was to switch off propafenone and start flecainide 100 mg BID. She was due to follow up for an EKG post Flecainide start.  4.) ROS related to problem: The patient had a BMP drawn on 10/23/15 when seeing Amber, NP and her creatinine was 2.66 at the time. Per [employer], the patient's lasix, potassium, lisinopril, and myrbetriq were all placed on hold. She was told not to start flecainide yet. Today's EKG follow up was not cancelled however. She had a repeat BMP on 10/28/15 and her creatinine was 1.88. Dr. Caryl Comes called the patient and instructed her to have restart lasix 40 mg once daily x 3 days and then 20 mg once daily after that. She was to have a repeat BMP early this week.  5.) Assessment and plan per MD: No EKG done today since flecainide has not been started. BMP drawn again today. The patient is complaining of some SOB since the end of last week. She states she laid in bed over the weekend mostly. She is in a wheelchair today and somewhat SOB with speaking. Manual pulse taken on patient appears to be NSR today. Her weight is 161 lbs today (down from 166 lbs on 10/23/15). Reviewed with Chanetta Marshall, NP- await results of labs and will decide on further medication changes that time. The patient reports today that she restarted her lisinopril, but is still holding her potassium and myrbetriq.

## 2015-11-08 ENCOUNTER — Inpatient Hospital Stay (HOSPITAL_COMMUNITY)
Admission: EM | Admit: 2015-11-08 | Discharge: 2015-11-16 | DRG: 292 | Disposition: A | Discharge status: Home Health | Payer: Medicare Other | Attending: Cardiovascular Disease | Admitting: Cardiovascular Disease

## 2015-11-08 ENCOUNTER — Emergency Department (HOSPITAL_COMMUNITY): Payer: Medicare Other

## 2015-11-08 ENCOUNTER — Encounter (HOSPITAL_COMMUNITY): Payer: Self-pay

## 2015-11-08 DIAGNOSIS — I429 Cardiomyopathy, unspecified: Secondary | ICD-10-CM | POA: Diagnosis present

## 2015-11-08 DIAGNOSIS — Z880 Allergy status to penicillin: Secondary | ICD-10-CM

## 2015-11-08 DIAGNOSIS — I340 Nonrheumatic mitral (valve) insufficiency: Secondary | ICD-10-CM | POA: Diagnosis present

## 2015-11-08 DIAGNOSIS — G629 Polyneuropathy, unspecified: Secondary | ICD-10-CM | POA: Diagnosis present

## 2015-11-08 DIAGNOSIS — I129 Hypertensive chronic kidney disease with stage 1 through stage 4 chronic kidney disease, or unspecified chronic kidney disease: Secondary | ICD-10-CM | POA: Diagnosis present

## 2015-11-08 DIAGNOSIS — Z950 Presence of cardiac pacemaker: Secondary | ICD-10-CM

## 2015-11-08 DIAGNOSIS — Z888 Allergy status to other drugs, medicaments and biological substances status: Secondary | ICD-10-CM

## 2015-11-08 DIAGNOSIS — I4510 Unspecified right bundle-branch block: Secondary | ICD-10-CM | POA: Diagnosis present

## 2015-11-08 DIAGNOSIS — Z923 Personal history of irradiation: Secondary | ICD-10-CM

## 2015-11-08 DIAGNOSIS — Z91013 Allergy to seafood: Secondary | ICD-10-CM | POA: Diagnosis not present

## 2015-11-08 DIAGNOSIS — R7989 Other specified abnormal findings of blood chemistry: Secondary | ICD-10-CM | POA: Diagnosis not present

## 2015-11-08 DIAGNOSIS — N179 Acute kidney failure, unspecified: Secondary | ICD-10-CM | POA: Diagnosis not present

## 2015-11-08 DIAGNOSIS — R079 Chest pain, unspecified: Secondary | ICD-10-CM | POA: Diagnosis not present

## 2015-11-08 DIAGNOSIS — R9389 Abnormal findings on diagnostic imaging of other specified body structures: Secondary | ICD-10-CM

## 2015-11-08 DIAGNOSIS — R0602 Shortness of breath: Secondary | ICD-10-CM

## 2015-11-08 DIAGNOSIS — I5033 Acute on chronic diastolic (congestive) heart failure: Secondary | ICD-10-CM | POA: Diagnosis present

## 2015-11-08 DIAGNOSIS — E7800 Pure hypercholesterolemia, unspecified: Secondary | ICD-10-CM | POA: Diagnosis present

## 2015-11-08 DIAGNOSIS — Z23 Encounter for immunization: Secondary | ICD-10-CM

## 2015-11-08 DIAGNOSIS — I509 Heart failure, unspecified: Secondary | ICD-10-CM | POA: Diagnosis not present

## 2015-11-08 DIAGNOSIS — I248 Other forms of acute ischemic heart disease: Secondary | ICD-10-CM | POA: Diagnosis present

## 2015-11-08 DIAGNOSIS — Z87891 Personal history of nicotine dependence: Secondary | ICD-10-CM

## 2015-11-08 DIAGNOSIS — E0590 Thyrotoxicosis, unspecified without thyrotoxic crisis or storm: Secondary | ICD-10-CM | POA: Diagnosis present

## 2015-11-08 DIAGNOSIS — D649 Anemia, unspecified: Secondary | ICD-10-CM | POA: Diagnosis present

## 2015-11-08 DIAGNOSIS — I5042 Chronic combined systolic (congestive) and diastolic (congestive) heart failure: Secondary | ICD-10-CM | POA: Insufficient documentation

## 2015-11-08 DIAGNOSIS — F488 Other specified nonpsychotic mental disorders: Secondary | ICD-10-CM

## 2015-11-08 DIAGNOSIS — Z853 Personal history of malignant neoplasm of breast: Secondary | ICD-10-CM | POA: Diagnosis not present

## 2015-11-08 DIAGNOSIS — Z79899 Other long term (current) drug therapy: Secondary | ICD-10-CM

## 2015-11-08 DIAGNOSIS — Q231 Congenital insufficiency of aortic valve: Secondary | ICD-10-CM

## 2015-11-08 DIAGNOSIS — Z7901 Long term (current) use of anticoagulants: Secondary | ICD-10-CM | POA: Diagnosis not present

## 2015-11-08 DIAGNOSIS — I480 Paroxysmal atrial fibrillation: Secondary | ICD-10-CM | POA: Diagnosis not present

## 2015-11-08 DIAGNOSIS — Z85828 Personal history of other malignant neoplasm of skin: Secondary | ICD-10-CM

## 2015-11-08 DIAGNOSIS — R778 Other specified abnormalities of plasma proteins: Secondary | ICD-10-CM

## 2015-11-08 DIAGNOSIS — N184 Chronic kidney disease, stage 4 (severe): Secondary | ICD-10-CM | POA: Diagnosis present

## 2015-11-08 DIAGNOSIS — I5043 Acute on chronic combined systolic (congestive) and diastolic (congestive) heart failure: Secondary | ICD-10-CM | POA: Diagnosis present

## 2015-11-08 DIAGNOSIS — Z8673 Personal history of transient ischemic attack (TIA), and cerebral infarction without residual deficits: Secondary | ICD-10-CM | POA: Diagnosis not present

## 2015-11-08 DIAGNOSIS — R32 Unspecified urinary incontinence: Secondary | ICD-10-CM | POA: Diagnosis present

## 2015-11-08 DIAGNOSIS — I481 Persistent atrial fibrillation: Secondary | ICD-10-CM | POA: Diagnosis present

## 2015-11-08 DIAGNOSIS — E876 Hypokalemia: Secondary | ICD-10-CM | POA: Diagnosis present

## 2015-11-08 DIAGNOSIS — I4891 Unspecified atrial fibrillation: Secondary | ICD-10-CM | POA: Diagnosis present

## 2015-11-08 DIAGNOSIS — Z8542 Personal history of malignant neoplasm of other parts of uterus: Secondary | ICD-10-CM

## 2015-11-08 DIAGNOSIS — Z9013 Acquired absence of bilateral breasts and nipples: Secondary | ICD-10-CM

## 2015-11-08 LAB — BASIC METABOLIC PANEL
ANION GAP: 13 (ref 5–15)
BUN: 26 mg/dL — ABNORMAL HIGH (ref 6–20)
CO2: 26 mmol/L (ref 22–32)
Calcium: 9.2 mg/dL (ref 8.9–10.3)
Chloride: 104 mmol/L (ref 101–111)
Creatinine, Ser: 1.85 mg/dL — ABNORMAL HIGH (ref 0.44–1.00)
GFR calc Af Amer: 29 mL/min — ABNORMAL LOW (ref >60)
GFR, EST NON AFRICAN AMERICAN: 25 mL/min — AB (ref >60)
GLUCOSE: 110 mg/dL — AB (ref 65–99)
POTASSIUM: 3.2 mmol/L — AB (ref 3.5–5.1)
Sodium: 143 mmol/L (ref 135–145)

## 2015-11-08 LAB — URINALYSIS, ROUTINE W REFLEX MICROSCOPIC
Bilirubin Urine: NEGATIVE
GLUCOSE, UA: NEGATIVE mg/dL
KETONES UR: NEGATIVE mg/dL
Nitrite: POSITIVE — AB
PROTEIN: 100 mg/dL — AB
Specific Gravity, Urine: 1.011 (ref 1.005–1.030)
pH: 6.5 (ref 5.0–8.0)

## 2015-11-08 LAB — MAGNESIUM: MAGNESIUM: 1.7 mg/dL (ref 1.7–2.4)

## 2015-11-08 LAB — CBC WITH DIFFERENTIAL/PLATELET
BASOS ABS: 0 10*3/uL (ref 0.0–0.1)
Basophils Relative: 0 %
Eosinophils Absolute: 0.1 10*3/uL (ref 0.0–0.7)
Eosinophils Relative: 1 %
HEMATOCRIT: 35.1 % — AB (ref 36.0–46.0)
Hemoglobin: 11.2 g/dL — ABNORMAL LOW (ref 12.0–15.0)
LYMPHS PCT: 15 %
Lymphs Abs: 1.2 10*3/uL (ref 0.7–4.0)
MCH: 29.9 pg (ref 26.0–34.0)
MCHC: 31.9 g/dL (ref 30.0–36.0)
MCV: 93.9 fL (ref 78.0–100.0)
Monocytes Absolute: 0.5 10*3/uL (ref 0.1–1.0)
Monocytes Relative: 6 %
NEUTROS ABS: 6 10*3/uL (ref 1.7–7.7)
NEUTROS PCT: 78 %
Platelets: 236 10*3/uL (ref 150–400)
RBC: 3.74 MIL/uL — AB (ref 3.87–5.11)
RDW: 14.2 % (ref 11.5–15.5)
WBC: 7.7 10*3/uL (ref 4.0–10.5)

## 2015-11-08 LAB — CBG MONITORING, ED: Glucose-Capillary: 99 mg/dL (ref 65–99)

## 2015-11-08 LAB — TSH: TSH: 0.068 u[IU]/mL — AB (ref 0.350–4.500)

## 2015-11-08 LAB — TROPONIN I
TROPONIN I: 0.57 ng/mL — AB (ref <0.031)
Troponin I: 0.49 ng/mL — ABNORMAL HIGH (ref <0.031)

## 2015-11-08 LAB — BRAIN NATRIURETIC PEPTIDE: B NATRIURETIC PEPTIDE 5: 707.6 pg/mL — AB (ref 0.0–100.0)

## 2015-11-08 LAB — URINE MICROSCOPIC-ADD ON

## 2015-11-08 LAB — PROTIME-INR
INR: 1.95 — AB (ref 0.00–1.49)
PROTHROMBIN TIME: 22.1 s — AB (ref 11.6–15.2)

## 2015-11-08 LAB — D-DIMER, QUANTITATIVE (NOT AT ARMC): D DIMER QUANT: 0.72 ug{FEU}/mL — AB (ref 0.00–0.50)

## 2015-11-08 MED ORDER — ONDANSETRON HCL 4 MG/2ML IJ SOLN: 4.0000 mg | Freq: Four times a day (QID) | INTRAMUSCULAR | Status: DC | PRN

## 2015-11-08 MED ORDER — INFLUENZA VAC SPLIT QUAD 0.5 ML IM SUSY
0.5000 mL | PREFILLED_SYRINGE | INTRAMUSCULAR | Status: DC
  Filled 2015-11-08 (×3): qty 0.5

## 2015-11-08 MED ORDER — FUROSEMIDE 10 MG/ML IJ SOLN
40.0000 mg | Freq: Once | INTRAMUSCULAR | Status: AC
  Administered 2015-11-08: 40 mg via INTRAVENOUS
  Filled 2015-11-08: qty 4

## 2015-11-08 MED ORDER — SODIUM CHLORIDE 0.9% FLUSH
3.0000 mL | Freq: Two times a day (BID) | INTRAVENOUS | Status: DC
  Administered 2015-11-08 – 2015-11-16 (×13): 3 mL via INTRAVENOUS

## 2015-11-08 MED ORDER — PROPAFENONE HCL 225 MG PO TABS
225.0000 mg | ORAL_TABLET | Freq: Three times a day (TID) | ORAL | Status: DC
  Administered 2015-11-08 – 2015-11-11 (×8): 225 mg via ORAL
  Filled 2015-11-08 (×9): qty 1

## 2015-11-08 MED ORDER — VERAPAMIL HCL ER 240 MG PO TBCR
240.0000 mg | EXTENDED_RELEASE_TABLET | Freq: Every day | ORAL | Status: DC
  Administered 2015-11-08 – 2015-11-10 (×3): 240 mg via ORAL
  Filled 2015-11-08 (×7): qty 1

## 2015-11-08 MED ORDER — HYDRALAZINE HCL 25 MG PO TABS
25.0000 mg | ORAL_TABLET | Freq: Three times a day (TID) | ORAL | Status: DC
  Administered 2015-11-08: 25 mg via ORAL
  Filled 2015-11-08 (×2): qty 1

## 2015-11-08 MED ORDER — ASPIRIN EC 81 MG PO TBEC
81.0000 mg | DELAYED_RELEASE_TABLET | Freq: Every day | ORAL | Status: DC
  Administered 2015-11-09 – 2015-11-10 (×2): 81 mg via ORAL
  Filled 2015-11-08 (×2): qty 1

## 2015-11-08 MED ORDER — HEPARIN (PORCINE) IN NACL 100-0.45 UNIT/ML-% IJ SOLN
1000.0000 [IU]/h | INTRAMUSCULAR | Status: DC
  Administered 2015-11-08: 800 [IU]/h via INTRAVENOUS
  Administered 2015-11-09: 1000 [IU]/h via INTRAVENOUS
  Filled 2015-11-08 (×2): qty 250

## 2015-11-08 MED ORDER — SODIUM CHLORIDE 0.9% FLUSH: 3.0000 mL | INTRAVENOUS | Status: DC | PRN

## 2015-11-08 MED ORDER — ACETAMINOPHEN 325 MG PO TABS: 650.0000 mg | ORAL_TABLET | ORAL | Status: DC | PRN

## 2015-11-08 MED ORDER — SODIUM CHLORIDE 0.9 % IV SOLN: 250.0000 mL | INTRAVENOUS | Status: DC | PRN

## 2015-11-08 MED ORDER — POTASSIUM CHLORIDE CRYS ER 20 MEQ PO TBCR: 40.0000 meq | EXTENDED_RELEASE_TABLET | Freq: Every day | ORAL | Status: DC

## 2015-11-08 MED ORDER — EZETIMIBE 10 MG PO TABS
5.0000 mg | ORAL_TABLET | Freq: Every day | ORAL | Status: DC
  Administered 2015-11-08 – 2015-11-16 (×9): 5 mg via ORAL
  Filled 2015-11-08 (×9): qty 1

## 2015-11-08 MED ORDER — PREGABALIN 75 MG PO CAPS
75.0000 mg | ORAL_CAPSULE | Freq: Two times a day (BID) | ORAL | Status: DC
  Administered 2015-11-08 – 2015-11-16 (×16): 75 mg via ORAL
  Filled 2015-11-08 (×16): qty 1

## 2015-11-08 NOTE — ED Provider Notes (Signed)
Pain seen by cardiology, will admit.  Etta Quill, NP 11/08/15 [id number]  Charlesetta Shanks, MD 11/17/15 [phone]

## 2015-11-08 NOTE — ED Notes (Signed)
Cardiology at bedside.

## 2015-11-08 NOTE — ED Notes (Signed)
CBG- 99 

## 2015-11-08 NOTE — ED Provider Notes (Signed)
CSN: [id number]     Arrival date & time 11/08/15  1255 History   First MD Initiated Contact with Patient 11/08/15 1315     Chief Complaint  Patient presents with  . Shortness of Breath     (Consider location/radiation/quality/duration/timing/severity/associated sxs/prior Treatment) HPI   81 year old female with history of atrial fibrillation currently on warfarin, strokes, breast cancer, hypertension, sleep apnea brought here via EMS from home for complaint of shortness of breath. Patient report for the past 10 days she has gradual worsening shortness of breath and generalized fatigue. She gets out of breath even with walking a short distance. She is having inadequate rest at nighttime which is not unusual for her. Patient has a 3 pillow orthopnea. Today walking from her bedroom to the kitchen she gets out of breath and decided to contact EMS to bring her to the ER. Nursing notes indicate that patient initially was not able to speak of exhibits itself without shortness of breath when EMS arrive however after receiving supplemental oxygenation, patient reported has helped a bit. She is not on home O2. She has history of sleep apnea but unable to tolerate mask. She denies having any URI symptoms, no fever, chills, headache, chest pain, productive cough, hemoptysis, back pain, abdominal pain, nausea vomiting diarrhea, focal numbness, or rash. Patient has a pacemaker. She denies any recent medication changes.  Past Medical History  Diagnosis Date  . Hypertension   . Peripheral neuropathy ([place])   . High cholesterol   . Persistent atrial fibrillation ([place])   . History of radiation therapy   . Partial bowel obstruction ([facility]) JAN/2013    recurrent  . Colon polyp 2011  . Osteopenia 02/2013    T score -1.4 FRAX 11%/2.1%  . Stroke [facility])     "small stroke & several TIA's occasional visual problems / and occasional speach problems  . Arthritis   . Personal history of skin cancer   . Sleep apnea      unaable to tolerate mask   . Endometrial cancer ([place]) 2006    Stage IIIc  s/p radiation  . Breast cancer ([place]) 1999    LOBULAR CARCINOMA IN SITU...  . Small bowel obstruction due to adhesions ([place]) 12/28/2013  . Internal hemorrhoids   . Recurrent Clostridium difficile diarrhea   . Mobitz II     a. s/p MDT PPM    Past Surgical History  Procedure Laterality Date  . Lymph node resection  2007  . Cholecystectomy  1990's  . Mastectomy  1999    bilaterally w/lymph node dissection  . Abdominal hysterectomy  2006    BSO  . Oophorectomy      BSO  . Exploratory laparotomy  2007    remove abdominal cyst  . Insert / replace / remove pacemaker  2007    initial placement  2007 / REPLACED 2014     . Laparoscopic lysis intestinal adhesions  12/28/2013  . Abdominal adhesion surgery  12/28/2013  . Small intestine surgery  12/28/2013  . Laparoscopic lysis of adhesions N/A 12/28/2013    Procedure: LAPAROSCOPIC LYSIS OF ADHESIONS converted OPEN SMALL BOWEL;  Surgeon: Adin Hector, MD;  Location: [facility] ORS;  Service: General;  Laterality: N/A;  . Permanent pacemaker generator change N/A 03/23/2012    Procedure: PERMANENT PACEMAKER GENERATOR CHANGE;  Surgeon: Deboraha Sprang, MD;  Location: [facility] CATH LAB;  Service: Cardiovascular;  Laterality: N/A;  . Colonoscopy with propofol N/A 12/27/2014    Procedure: COLONOSCOPY WITH PROPOFOL;  Surgeon: Jerene Bears, MD;  Location: [facility] ENDOSCOPY;  Service: Gastroenterology;  Laterality: N/A;  . Fecal transplant N/A 12/27/2014    Procedure: FECAL TRANSPLANT;  Surgeon: Jerene Bears, MD;  Location: [facility] ENDOSCOPY;  Service: Gastroenterology;  Laterality: N/A;   Family History  Problem Relation Age of Onset  . Cancer Mother 68    UTERINE  . Breast cancer Mother 20  . Hypertension Mother   . Uterine cancer Mother   . Cancer Father     PROSTATE  . Hypertension Father   . Colon cancer Neg Hx   . Esophageal cancer Neg Hx   . Rectal cancer Neg Hx   . Stomach cancer Neg Hx     Social History  Substance Use Topics  . Smoking status: Former Smoker -- 1.00 packs/day for 40 years    Types: Cigarettes    Quit date: 11/26/1998  . Smokeless tobacco: Never Used  . Alcohol Use: No     Comment: 09/14/11 "last drink ~ 08/31/2009"   OB History    Gravida Para Term Preterm AB TAB SAB Ectopic Multiple Living   2 2 2       2      Review of Systems  All other systems reviewed and are negative.     Allergies  Other; Penicillins; Shellfish-derived products; Statins; and Iohexol  Home Medications   Prior to Admission medications   Medication Sig Start Date End Date Taking? Authorizing Provider  calcium carbonate (TUMS - DOSED IN MG ELEMENTAL CALCIUM) 500 MG chewable tablet Chew 1 tablet by mouth daily.    Historical Provider, MD  Cholecalciferol (VITAMIN D-3) 5000 UNITS TABS Take 1 tablet by mouth 4 (four) times a week. Takes on Saturday, Sunday, Tuesday and Thursday    Historical Provider, MD  ezetimibe (ZETIA) 10 MG tablet 1/2 tablet by mouth daily 10/14/15   Darlin Coco, MD  furosemide (LASIX) 40 MG tablet Take 1/2 tablet (20 mg) by mouth once every other day      Historical Provider, MD  hydrALAZINE (APRESOLINE) 25 MG tablet Take 25 mg by mouth 3 (three) times daily.     Historical Provider, MD  KLOR-CON M10 10 MEQ tablet TAKE 1 TABLET (10 MEQ TOTAL) BY MOUTH DAILY. 03/19/15   Darlin Coco, MD  mirabegron ER (MYRBETRIQ) 50 MG TB24 tablet Take 50 mg by mouth daily.    Historical Provider, MD  pregabalin (LYRICA) 75 MG capsule Take 75 mg by mouth 2 (two) times daily.    Historical Provider, MD  verapamil (CALAN-SR) 240 MG CR tablet Take 240 mg by mouth daily.    Historical Provider, MD  warfarin (COUMADIN) 2.5 MG tablet Take as directed by coumadin clinic 04/22/15   Darlin Coco, MD   SpO2 97% Physical Exam  Constitutional: She is oriented to person, place, and time. She appears well-developed and well-nourished. No distress.  Elderly Caucasian female,  nontoxic in appearance  HENT:  Head: Atraumatic.  Eyes: Conjunctivae are normal.  Neck: Neck supple. No JVD present.  Cardiovascular: Intact distal pulses.   Murmur heard. Irregularly irregular heart rhythm with a systolic murmur but no rubs or gallops  Pulmonary/Chest:  Patient is mildly tachypneic, with decrease lung sounds but no wheezes, rales, or rhonchi heard  Abdominal: Soft. There is no tenderness.  Musculoskeletal: She exhibits edema (trace bilateral pitting edema).  Neurological: She is alert and oriented to person, place, and time.  Skin: No rash noted.  Psychiatric: She has a normal mood and affect.  Nursing note and vitals reviewed.   ED Course  Procedures (including critical care time) Labs Review Labs Reviewed - No data to display  Imaging Review No results found. I have personally reviewed and evaluated these images and lab results as part of my medical decision-making.   EKG Interpretation   Date/Time:  Friday November 08 2015 13:21:37 EST Ventricular Rate:  81 PR Interval:    QRS Duration: 120 QT Interval:  535 QTC Calculation: 621 R Axis:   80 Text Interpretation:  Atrial fibrillation IVCD, consider atypical RBBB  Repol abnrm suggests ischemia, lateral leads Baseline wander in lead(s) I  aVR Confirmed by Hazle Coca [phone]) on 11/08/2015 1:39:19 PM      MDM   Final diagnoses:  SOB (shortness of breath)  Psychogenic general fatigue  Acute on chronic congestive heart failure, unspecified congestive heart failure type (HCC)    BP 124/49 mmHg  Pulse 61  Temp(Src) 97.8 F (36.6 C) (Oral)  Resp 18  Ht 5\' 6"  (1.676 m)  Wt 69.5 kg  BMI 24.74 kg/m2  SpO2 98%   1:44 PM Patient here with progressive worsening shortness of breath and generalized fatigue. Workup initiated. Patient has a pacemaker however her EKG she today demonstrated evidence of atrial fibrillation without evidence of pace rhythm.  She does not have any active chest pain.  She is also on  warfarin, therefore i will check INR.  Will interrogate her pace maker as well.  She does not have any active chest pain.  Care discussed with Dr. Ralene Bathe.  2:56 PM EKG without acute ischemic changes however troponin is elevated at 0.49. She is mildly subtherapeutic INR of 1.95. Mild hypokalemia with a potassium of 3.2. Evidence of renal insufficiency with a creatinine of 1.85 which is improves from prior values.  Her BNP today is 707.6  Suspect her shortness of breath is likely secondary to CHF exacerbation, however no overt signs of fluid overload.  She's taking Lasix 40mg . Appreciate consultation to our cardmaster, Trish, who will send a cardiologist to evaluate patient.  Care discussed with oncoming provider who will help f/u with cardiology's recommendation in regard to her disposition.  Her pace maker is being interrogated.  No result yet.        Domenic Moras, PA-C 11/09/15 [place], MD 11/09/15 [id number]  Quintella Reichert, MD 11/09/15 [phone]

## 2015-11-08 NOTE — Progress Notes (Signed)
CRITICAL VALUE ALERT  Critical value received:  Troponin - 0.57  Date of notification:  11/08/2015  Time of notification:  2125  Critical value read back:Yes.    Nurse who received alert:  Jannifer Rodney  MD notified (1st page):  On-call Cardiology - Oval Linsey via text page   Time of first page:  2129   MD notified (2nd page):n/a  Time of second page:n/a  Responding MD:  No return page  Time MD responded:  N/a  Note: Patient seen by Cardiology and feel elevated troponins due to CHF.

## 2015-11-08 NOTE — Consult Note (Signed)
CARDIOLOGY CONSULT NOTE   Patient ID: DAVEAH KAPELA MRN: [id number] DOB/AGE: [birth year] 81 y.o.  Admit date: 11/08/2015  Primary Physician   Tawanna Solo, MD Primary Cardiologist: Dr. Mare Ferrari  Reason for Consultation: SOB, elevated troponin   HPI: Ms. Hollenbach is a 81 year old female with a PMH of HTN, Afib (on warfarin), CVA, breast CA, has PPM, cdiff (s/p fecal transplant). She was brought to ED via EMS for SOB that has gradually gotten worse over the past 10 days. Today she was short of breath walking from her bedroom to her kitchen and was unable to speak because she was so dyspneic. She states that her SOB is at rest as well.  Her troponin is elevated at 0.49. BNP is elevated at 707.6. Chest X ray reveals possible early infiltrate on right lower lobe.   She is followed by EP, s/p [employer] PPM placement in 2007, most recent generator change in 2013.  She last had an ECHO in June 2016, EF 55-60%, wall motion normal, grade 2 diastolic dysfunction.  She has never had any ischemic work up.   Last week, her Lasix was reduced as her creatinine was increasing. She has been taking furosemide 20mg  every other day for the past week, this is a reduction from her previous dose of 40mg  daily. She reports that her SOB is the same when she takes 20mg  and when she takes 40mg . Denies chest pain/pressure and palpitations.    Past Medical History  Diagnosis Date  . Hypertension   . Peripheral neuropathy ([place])   . High cholesterol   . Persistent atrial fibrillation ([place])   . History of radiation therapy   . Partial bowel obstruction (HCC) JAN/2013    recurrent  . Colon polyp 2011  . Osteopenia 02/2013    T score -1.4 FRAX 11%/2.1%  . Stroke [facility])     "small stroke & several TIA's occasional visual problems / and occasional speach problems  . Arthritis   . Personal history of skin cancer   . Sleep apnea     unaable to tolerate mask   . Endometrial cancer ([place]) 2006    Stage IIIc   s/p radiation  . Breast cancer ([place]) 1999    LOBULAR CARCINOMA IN SITU...  . Small bowel obstruction due to adhesions ([place]) 12/28/2013  . Internal hemorrhoids   . Recurrent Clostridium difficile diarrhea   . Mobitz II     a. s/p MDT PPM      Past Surgical History  Procedure Laterality Date  . Lymph node resection  2007  . Cholecystectomy  1990's  . Mastectomy  1999    bilaterally w/lymph node dissection  . Abdominal hysterectomy  2006    BSO  . Oophorectomy      BSO  . Exploratory laparotomy  2007    remove abdominal cyst  . Insert / replace / remove pacemaker  2007    initial placement  2007 / REPLACED 2014     . Laparoscopic lysis intestinal adhesions  12/28/2013  . Abdominal adhesion surgery  12/28/2013  . Small intestine surgery  12/28/2013  . Laparoscopic lysis of adhesions N/A 12/28/2013    Procedure: LAPAROSCOPIC LYSIS OF ADHESIONS converted OPEN SMALL BOWEL;  Surgeon: Adin Hector, MD;  Location: [facility] ORS;  Service: General;  Laterality: N/A;  . Permanent pacemaker generator change N/A 03/23/2012    Procedure: PERMANENT PACEMAKER GENERATOR CHANGE;  Surgeon: Deboraha Sprang, MD;  Location: [facility] CATH LAB;  Service: Cardiovascular;  Laterality: N/A;  . Colonoscopy with propofol N/A 12/27/2014    Procedure: COLONOSCOPY WITH PROPOFOL;  Surgeon: Jerene Bears, MD;  Location: [facility] ENDOSCOPY;  Service: Gastroenterology;  Laterality: N/A;  . Fecal transplant N/A 12/27/2014    Procedure: FECAL TRANSPLANT;  Surgeon: Jerene Bears, MD;  Location: [facility] ENDOSCOPY;  Service: Gastroenterology;  Laterality: N/A;    Allergies  Allergen Reactions  . Other Hives    Contrast dye   . Penicillins Itching  . Shellfish-Derived Products Nausea And Vomiting  . Statins Other (See Comments)    Myalgias,elevated LFT studies  . Iohexol Hives and Itching    Itching in eyes, one hive on face.    I have reviewed the patient's current medications     Prior to Admission medications   Medication Sig   calcium carbonate (TUMS - DOSED IN MG ELEMENTAL CALCIUM) 500 MG chewable tablet Chew 1 tablet by mouth daily.  Cholecalciferol (VITAMIN D-3) 5000 UNITS TABS Take 1 tablet by mouth 4 (four) times a week. Takes on Saturday, Sunday, Tuesday and Thursday  ezetimibe (ZETIA) 10 MG tablet 1/2 tablet by mouth daily  furosemide (LASIX) 40 MG tablet Take 1/2 tablet (20 mg) by mouth once every other day    hydrALAZINE (APRESOLINE) 25 MG tablet Take 25 mg by mouth 3 (three) times daily.   KLOR-CON M10 10 MEQ tablet TAKE 1 TABLET (10 MEQ TOTAL) BY MOUTH DAILY.  mirabegron ER (MYRBETRIQ) 50 MG TB24 tablet Take 50 mg by mouth daily.  pregabalin (LYRICA) 75 MG capsule Take 75 mg by mouth 2 (two) times daily.  verapamil (CALAN-SR) 240 MG CR tablet Take 240 mg by mouth daily.  warfarin (COUMADIN) 2.5 MG tablet Take as directed by coumadin clinic     Social History   Social History  . Marital Status: Widowed    Spouse Name: N/A  . Number of Children: 2  . Years of Education: N/A   Occupational History  . Retired    Social History Main Topics  . Smoking status: Former Smoker -- 1.00 packs/day for 40 years    Types: Cigarettes    Quit date: 11/26/1998  . Smokeless tobacco: Never Used  . Alcohol Use: No     Comment: 09/14/11 "last drink ~ 08/31/2009"  . Drug Use: No  . Sexual Activity: No   Other Topics Concern  . Not on file   Social History Narrative   Widowed mother of 2    Family Status  Relation Status Death Age  . Mother Alive   . Father Alive   . Maternal Grandmother Deceased   . Maternal Grandfather Deceased   . Paternal Grandmother Deceased   . Paternal Grandfather Deceased    Family History  Problem Relation Age of Onset  . Cancer Mother 45    UTERINE  . Breast cancer Mother 60  . Hypertension Mother   . Uterine cancer Mother   . Cancer Father     PROSTATE  . Hypertension Father   . Colon cancer Neg Hx   . Esophageal cancer Neg Hx   . Rectal cancer Neg Hx   . Stomach  cancer Neg Hx      ROS:  Full 14 point review of systems complete and found to be negative unless listed above.  Physical Exam: Blood pressure 166/73, pulse 87, temperature 97.8 F (36.6 C), temperature source Oral, resp. rate 20, height 5\' 6"  (1.676 m), weight 161 lb (73.029 kg), SpO2 100 %.  General: Well developed, well nourished, female in no acute distress Head: Eyes PERRLA, No xanthomas.   Normocephalic and atraumatic, oropharynx without edema or exudate. Dentition: Good Lungs: Diminished on RLL, fine crackles on LLL.  Upper lobes clear.  Heart:  Heart irregular rate and rhythm with S1, S2  murmur. pulses are 2+ extrem.   Neck: No carotid bruits. No lymphadenopathy.  No JVD. Abdomen: Bowel sounds present, abdomen soft and non-tender without masses or hernias noted. Msk:  No spine or cva tenderness. No weakness, no joint deformities or effusions. Extremities: No clubbing or cyanosis.  edema.  Neuro: Alert and oriented X 3. No focal deficits noted. Psych:  Good affect, responds appropriately Skin: No rashes or lesions noted.  Labs:   Lab Results  Component Value Date   WBC 7.7 11/08/2015   HGB 11.2* 11/08/2015   HCT 35.1* 11/08/2015   MCV 93.9 11/08/2015   PLT 236 11/08/2015    Recent Labs  11/08/15 1356  INR 1.95*    Recent Labs Lab 11/08/15 1356  NA 143  K 3.2*  CL 104  CO2 26  BUN 26*  CREATININE 1.85*  CALCIUM 9.2  GLUCOSE 110*   MAGNESIUM  Date Value Ref Range Status  12/29/2013 1.6 1.5 - 2.5 mg/dL Final    Recent Labs  11/08/15 1356  TROPONINI 0.49*   No results for input(s): TROPIPOC in the last 72 hours. B NATRIURETIC PEPTIDE  Date/Time Value Ref Range Status  11/08/2015 01:56 PM 707.6* 0.0 - 100.0 pg/mL Final   Lab Results  Component Value Date   CHOL 117 02/27/2015   HDL 34* 02/27/2015   LDLCALC 66 02/27/2015   TRIG 85 02/27/2015   No results found for: [facility] LIPASE  Date/Time Value Ref Range Status  09/13/2011 11:46 PM 74* 11 -  59 U/L Final   TSH  Date/Time Value Ref Range Status  06/18/2015 04:58 PM 0.013* 0.350 - 4.500 uIU/mL Final   FERRITIN  Date/Time Value Ref Range Status  05/07/2014 05:17 PM 140.2 10.0 - 291.0 ng/mL Final   IRON  Date/Time Value Ref Range Status  05/07/2014 05:17 PM 37* 42 - 145 ug/dL Final    Echo: June 2015 - Left ventricle: The cavity size was normal. Wall thickness was normal. Septal bounce from RV pacing. Systolic function was normal. The estimated ejection fraction was in the range of 55% to 60%. Wall motion was normal; there were no regional wall motion abnormalities. Features are consistent with a pseudonormal left ventricular filling pattern, with concomitant abnormal relaxation and increased filling pressure (grade 2 diastolic dysfunction). - Aortic valve: Functionally bicuspid with fused right and left cusps; moderately calcified leaflets. There was mild stenosis. There was trivial regurgitation. Mean gradient (S): 13 mm Hg. Peak gradient (S): 26 mm Hg. - Mitral valve: Mildly calcified annulus. There was mild regurgitation. - Left atrium: The atrium was mildly dilated. - Right ventricle: The cavity size was normal. Pacer wire or catheter noted in right ventricle. Systolic function was normal. - Right atrium: The atrium was mildly dilated. - Inferior vena cava: The vessel was normal in size. The respirophasic diameter changes were in the normal range (>= 50%), consistent with normal central venous pressure.   ECG:   Radiology:  Dg Chest Portable 1 View  11/08/2015  CLINICAL DATA:  Worsening shortness of breath for 1 week and a half EXAM: PORTABLE CHEST 1 VIEW COMPARISON:  07/06/2014 FINDINGS: Cardiomediastinal silhouette is stable. Dual lead cardiac pacemaker is unchanged in position. Atherosclerotic  calcifications of thoracic aorta. Stable probable chronic mild interstitial prominence. There is streaky right base medially atelectasis or  early infiltrate. Trace right pleural effusion with blunting of the costophrenic angle. Stable chronic left basilar scarring. No pulmonary edema. IMPRESSION: Streaky right base medially atelectasis or early infiltrate. Trace right pleural effusion. No pulmonary edema. Probable chronic mild interstitial prominence. Electronically Signed   By: Lahoma Crocker M.D.   On: 11/08/2015 15:05    ASSESSMENT AND PLAN:   Principal Problem:   Acute on chronic diastolic HF (heart failure) (HCC) Active Problems:   Pacemaker- Dual-chamber-mdt   Elevated troponin   1. Acute on Chronic Diastolic HF: Last echo [id number] showed normal LV systolic function but [id number]. BNP is elevated at 707.  Will need gentle IV diuresis, Lasix 40mg  IV then reassess in the am.  Strict I&O Daily weights Her last 2D echo was in June 2015, will need repeat.  Consider checking D-dimer  BMP in the am Heart Healthy, low sodium diet.   2. Elevated Troponin: initial troponin is 0.49. Patient denies chest pain. This may be secondary to demand ischemia in the setting of acute on chronic diastolic HF. We will continue to cycle cardiac enzymes x 3 to assess trend. Given the need for possible LHC, if significant upward trend in enzymes, would recommend starting IV heparin now as INR is <2.0. Hold Coumadin. We will repeat 2D echo to assess for any change in LVF or wall motion.   3. HTN Patient's BP is elevated, she has not taken any of her home meds today. Will reorder her meds and reassess in the am.    Signed: Arbutus Leas, NP 11/08/2015 3:08 PM  Co-Sign MD   Patient seen and examined. Agree with assessment and plan.  Murl Maccarone is a 81 year old white female who has been followed by Dr. Mare Ferrari for cardiology care and Dr. Caryl Comes for electrophysiologic care.  The patient has a history of hypertension, atrial fibrillation and has been on chronic Coumadin therapy, and is status post initial pacemaker implantation in [place] with  subsequent pacemaker generator change by Dr. Caryl Comes in 2013.  She has been maintained on propofenone for paroxysmal atrial fibrillation , which recently is persistent.  Recently, there were plans to consider switching to flecainide, but this was not done due to her progressive renal insufficiency.  She has a remote history of sleep apnea, which is untreated.  She has a history of C. difficile infection which failed to clear with vancomycin as result, underwent fecal transplant.  Presently, she presents with increasing symptoms of shortness of breath, fatigability, and loose stool.  She denies any prolonged episodes of chest pressure.  On presentation in the emergency room, she was hypertensive, but she had not taken her medications this morning.  Monitor reveals atrial fibrillation with a ventricular response in the 90s to 100 range.  HEENT was unremarkable.  JVD approximated 78 cm.  She had decreased breath sounds, right greater than left lower field.  Rhythm was irregularly irregular with ventricular rate in the 0000000,  1/6 systolic murmur.  Abdomen was soft.  Bowel sounds are positive.  She did not have significant edema.  Neurologic exam was grossly nonfocal.  Her ECG re4veals  atrial fibrillation at 81 bpm.  There is incomplete right bundle branch block.  She also has more pronounced T-wave inversion in leads V3 and V4.  Is uncertain as to if these are new since her prior ECGs have shown ventricular pacing with 100%  capture.  Admission laboratories notable for a potassium of 3.2.  She has stage IV chronic kidney disease with a creatinine of 1.85 and GFR estimate of 25.  Her BMP is elevated consistent with probable acute on chronic diastolic heart failure.  She is anemic with hemoglobin of 11.2, hematocrit 35.1.  Although she has been on Coumadin.  Her INR slightly subtherapeutic at 1.95.  Chest x-ray reveals probable right lower lung field atelectasis.  There is mild interstitial prominence.  A prior echo Doppler  study in June 2016 showed an EF of 55-60% with grade 2 diastolic dysfunction.  There was a suggestion of a functionally bicuspid aortic valve with fused right and left costs and moderate calcification with mild aortic stenosis (mean gradient 13 and peak gradient 26 mm).  We'll plan to repeat the echo Doppler study to reassess systolic and diastolic function as well as valvular architecture.  At present, will give Lasix 40 mg  IV to induce diuresis.  Mg level will be checked and potassium repleted.  Will recheck thyroid function studies since she had an over suppressed TSH at 0.013 in October 2016.  She is not on any thyroid replacement medication. With subtherapeutic INR start heparin, hold coumadin tonight depending on further evaluation and plans. F/U ECG and T changes in am.   Troy Sine, MD, [facility] 11/08/2015 5:53 PM

## 2015-11-08 NOTE — Progress Notes (Signed)
ANTICOAGULATION CONSULT NOTE - Initial Consult  Pharmacy Consult for Heparin Indication: chest pain/ACS  Allergies  Allergen Reactions  . Statins Other (See Comments)    Myalgias,elevated LFT studies  . Shellfish-Derived Products Nausea And Vomiting  . Iohexol Hives and Itching    Itching in eyes, one hive on face.  . Other Hives    Contrast dye   . Penicillins Itching    Patient Measurements: Height: 5\' 6"  (167.6 cm) Weight: 161 lb (73.029 kg) IBW/kg (Calculated) : 59.3  Vital Signs: Temp: 97.8 F (36.6 C) (03/03 1315) Temp Source: Oral (03/03 1315) BP: 162/66 mmHg (03/03 1700) Pulse Rate: 82 (03/03 1700)  Labs:  Recent Labs  11/08/15 1356  HGB 11.2*  HCT 35.1*  PLT 236  LABPROT 22.1*  INR 1.95*  CREATININE 1.85*  TROPONINI 0.49*    Estimated Creatinine Clearance: 25.6 mL/min (by C-G formula based on Cr of 1.85).   Medical History: Past Medical History  Diagnosis Date  . Hypertension   . Peripheral neuropathy ([place])   . High cholesterol   . Persistent atrial fibrillation ([place])   . History of radiation therapy   . Partial bowel obstruction (HCC) JAN/2013    recurrent  . Colon polyp 2011  . Osteopenia 02/2013    T score -1.4 FRAX 11%/2.1%  . Stroke [facility])     "small stroke & several TIA's occasional visual problems / and occasional speach problems  . Arthritis   . Personal history of skin cancer   . Sleep apnea     unaable to tolerate mask   . Endometrial cancer ([place]) 2006    Stage IIIc  s/p radiation  . Breast cancer ([place]) 1999    LOBULAR CARCINOMA IN SITU...  . Small bowel obstruction due to adhesions ([place]) 12/28/2013  . Internal hemorrhoids   . Recurrent Clostridium difficile diarrhea   . Mobitz II     a. s/p MDT PPM      Assessment: 30 yom admitted 11/08/2015 . Pharmacy consulted to dose IV heparin for CP/ACS.   Pt on coumadin PTA  PTA dose = 2.5mg  on Wednesday, and 1.25mg  all other days   INR on admit 1.95  Hgb 11.2, PLT 236     Goal of Therapy:  Heparin level 0.3-0.7 units/ml Monitor platelets by anticoagulation protocol: Yes   Plan:  Heparin 800 units/hr 8hr HL Daily HL/CBC when indicated Monitor for s/s of bleeding F/U resuming coumadin  Laquilla Dault C. Lennox Grumbles, PharmD Pharmacy Resident  Pager: [phone] 11/08/2015 6:28 PM

## 2015-11-08 NOTE — Progress Notes (Signed)
Patient transferred from the Ed via stretcher to room 3E27. Oriented patient to heart failure floor and room equipment. Verified telemetry box and leads with CMT and with second verificaton. VSS. Currently complains of no pain or discomfort. Will continue to monitor patient to end of shift.

## 2015-11-08 NOTE — ED Notes (Signed)
Pt. Coming from home via [facility] for SOB x1 week. Pt. Pushed life alert button today because her SOB hasn't resolved. Pt. Not able to speak or do any movement without dyspnea per EMS and pt. Pt. Placed on 2L oxygen en route and pt. sts that has helped her a little. Pt. Does not wear oxygen at home. Pt. AOx4.

## 2015-11-09 DIAGNOSIS — I480 Paroxysmal atrial fibrillation: Secondary | ICD-10-CM

## 2015-11-09 DIAGNOSIS — I248 Other forms of acute ischemic heart disease: Secondary | ICD-10-CM

## 2015-11-09 LAB — TROPONIN I
TROPONIN I: 0.49 ng/mL — AB (ref <0.031)
Troponin I: 0.26 ng/mL — ABNORMAL HIGH (ref <0.031)

## 2015-11-09 LAB — BASIC METABOLIC PANEL
Anion gap: 11 (ref 5–15)
BUN: 27 mg/dL — AB (ref 6–20)
CO2: 28 mmol/L (ref 22–32)
Calcium: 8.9 mg/dL (ref 8.9–10.3)
Chloride: 105 mmol/L (ref 101–111)
Creatinine, Ser: 1.88 mg/dL — ABNORMAL HIGH (ref 0.44–1.00)
GFR, EST AFRICAN AMERICAN: 28 mL/min — AB (ref >60)
GFR, EST NON AFRICAN AMERICAN: 24 mL/min — AB (ref >60)
Glucose, Bld: 86 mg/dL (ref 65–99)
POTASSIUM: 3.7 mmol/L (ref 3.5–5.1)
SODIUM: 144 mmol/L (ref 135–145)

## 2015-11-09 LAB — CBC
HCT: 30.6 % — ABNORMAL LOW (ref 36.0–46.0)
Hemoglobin: 9.7 g/dL — ABNORMAL LOW (ref 12.0–15.0)
MCH: 29.7 pg (ref 26.0–34.0)
MCHC: 31.7 g/dL (ref 30.0–36.0)
MCV: 93.6 fL (ref 78.0–100.0)
PLATELETS: 178 10*3/uL (ref 150–400)
RBC: 3.27 MIL/uL — AB (ref 3.87–5.11)
RDW: 14.1 % (ref 11.5–15.5)
WBC: 5.9 10*3/uL (ref 4.0–10.5)

## 2015-11-09 LAB — HEPARIN LEVEL (UNFRACTIONATED)
HEPARIN UNFRACTIONATED: 0.12 [IU]/mL — AB (ref 0.30–0.70)
Heparin Unfractionated: 0.31 IU/mL (ref 0.30–0.70)

## 2015-11-09 MED ORDER — POTASSIUM CHLORIDE CRYS ER 20 MEQ PO TBCR
20.0000 meq | EXTENDED_RELEASE_TABLET | Freq: Every day | ORAL | Status: DC
  Administered 2015-11-09 – 2015-11-10 (×2): 20 meq via ORAL
  Filled 2015-11-09 (×2): qty 1

## 2015-11-09 MED ORDER — WARFARIN SODIUM 2.5 MG PO TABS
1.2500 mg | ORAL_TABLET | Freq: Once | ORAL | Status: AC
  Administered 2015-11-09: 1.25 mg via ORAL
  Filled 2015-11-09: qty 0.5

## 2015-11-09 MED ORDER — FUROSEMIDE 10 MG/ML IJ SOLN
40.0000 mg | Freq: Two times a day (BID) | INTRAMUSCULAR | Status: DC
  Administered 2015-11-09 – 2015-11-10 (×3): 40 mg via INTRAVENOUS
  Filled 2015-11-09 (×4): qty 4

## 2015-11-09 MED ORDER — WARFARIN - PHARMACIST DOSING INPATIENT
Freq: Every day | Status: DC
  Administered 2015-11-09: 19:00:00
  Administered 2015-11-10 – 2015-11-13 (×4): 1
  Administered 2015-11-14 – 2015-11-15 (×2)

## 2015-11-09 NOTE — Progress Notes (Signed)
ANTICOAGULATION CONSULT NOTE - Follow Up Consult  Pharmacy Consult for heparin + warfarin Indication: chest pain/ACS and atrial fibrillation  Allergies  Allergen Reactions  . Statins Other (See Comments)    Myalgias,elevated LFT studies  . Shellfish-Derived Products Nausea And Vomiting  . Iohexol Hives and Itching    Itching in eyes, one hive on face.  . Other Hives    Contrast dye   . Penicillins Itching    Patient Measurements: Height: 5\' 6"  (167.6 cm) Weight: 153 lb 3.5 oz (69.5 kg) IBW/kg (Calculated) : 59.3  Vital Signs: Temp: 97.8 F (36.6 C) (03/04 0530) Temp Source: Oral (03/04 0530) BP: 101/43 mmHg (03/04 1056) Pulse Rate: 61 (03/04 0530)  Labs:  Recent Labs  11/08/15 1356 11/08/15 2015 11/09/15 0225  HGB 11.2*  --  9.7*  HCT 35.1*  --  30.6*  PLT 236  --  178  LABPROT 22.1*  --   --   INR 1.95*  --   --   HEPARINUNFRC  --   --  0.12*  CREATININE 1.85*  --  1.88*  TROPONINI 0.49* 0.57* 0.49*    Estimated Creatinine Clearance: 23.1 mL/min (by C-G formula based on Cr of 1.88).   Medications:  Prescriptions prior to admission  Medication Sig Dispense Refill Last Dose  . calcium carbonate (TUMS - DOSED IN MG ELEMENTAL CALCIUM) 500 MG chewable tablet Chew 0.5-1 tablets by mouth daily as needed for indigestion or heartburn.    11/07/2015 at Unknown time  . Cholecalciferol (VITAMIN D-3) 5000 UNITS TABS Take 1 tablet by mouth 4 (four) times a week. Takes on Saturday, Sunday, Tuesday and Thursday   11/07/2015 at Unknown time  . ezetimibe (ZETIA) 10 MG tablet 1/2 tablet by mouth daily (Patient taking differently: Take 5 mg by mouth daily. 1/2 tablet by mouth daily) 45 tablet 1 11/07/2015 at Unknown time  . furosemide (LASIX) 40 MG tablet Take 20 mg by mouth daily. Take 1/2 tablet (20 mg) by mouth daily   11/07/2015 at Unknown time  . hydrALAZINE (APRESOLINE) 25 MG tablet Take 25 mg by mouth 3 (three) times daily.    11/07/2015 at Unknown time  . pregabalin (LYRICA) 75  MG capsule Take 75 mg by mouth 2 (two) times daily.   11/07/2015 at Unknown time  . propafenone (RYTHMOL) 225 MG tablet Take 225 mg by mouth every 8 (eight) hours.   11/07/2015 at Unknown time  . verapamil (CALAN-SR) 240 MG CR tablet Take 240 mg by mouth daily.   11/07/2015 at Unknown time  . warfarin (COUMADIN) 2.5 MG tablet Take as directed by coumadin clinic (Patient taking differently: Takes 2.5mg  on wed only Takes 1.25mg  all other days) 30 tablet 3 11/07/2015 at Unknown time  . KLOR-CON M10 10 MEQ tablet TAKE 1 TABLET (10 MEQ TOTAL) BY MOUTH DAILY. 30 tablet 9 stopped at past week    Assessment: 81 yo F admitted 11/08/2015 with chest pain. Patient currently on heparin for ACS. Patient on coumadin PTA for Afib. Pharmacy consulted to dose heparin and restart coumadin.  Warfarin PTA dose: 2.5 mg on Wed, 1.25 mg all other days  (Last dose 3/2).    HL 0.31 (therapeutic), INR on admit 1.95, Hgb 11.2>9.7, Plt wnl. No s/sx of bleeding noted.  Goal of Therapy:  INR 2-3 Heparin level 0.3-0.7 units/ml Monitor platelets by anticoagulation protocol: Yes   Plan:   - Warfarin 1.25 mg x1 tonight - Continue heparin 1000 units/hr - Monitor daily INR, HL, CBC and  s/sx of bleeding - Plan to stop heparin once therapeutic  Dimitri Ped, PharmD. PGY-1 Pharmacy Resident Pager: [phone] 11/09/2015,11:42 AM

## 2015-11-09 NOTE — Progress Notes (Signed)
[place] for Heparin Indication: chest pain/ACS  Allergies  Allergen Reactions  . Statins Other (See Comments)    Myalgias,elevated LFT studies  . Shellfish-Derived Products Nausea And Vomiting  . Iohexol Hives and Itching    Itching in eyes, one hive on face.  . Other Hives    Contrast dye   . Penicillins Itching    Patient Measurements: Height: 5\' 6"  (167.6 cm) Weight: 155 lb 3.2 oz (70.398 kg) IBW/kg (Calculated) : 59.3  Vital Signs: Temp: 97.8 F (36.6 C) (03/04 0021) Temp Source: Oral (03/04 0021) BP: 139/55 mmHg (03/04 0021) Pulse Rate: 99 (03/04 0021)  Labs:  Recent Labs  11/08/15 1356 11/08/15 2015 11/09/15 0225  HGB 11.2*  --  9.7*  HCT 35.1*  --  30.6*  PLT 236  --  178  LABPROT 22.1*  --   --   INR 1.95*  --   --   HEPARINUNFRC  --   --  0.12*  CREATININE 1.85*  --  1.88*  TROPONINI 0.49* 0.57* 0.49*    Estimated Creatinine Clearance: 23.1 mL/min (by C-G formula based on Cr of 1.88).  Assessment: 81 y.o. female admitted with chest pain/CHF, h/o Afib Coumadin on hold, for heparin  Goal of Therapy:  Heparin level 0.3-0.7 units/ml Monitor platelets by anticoagulation protocol: Yes   Plan:  Increase Heparin  1000 units/hr Check heparin level in 8 hours.   Phillis Knack, PharmD, BCPS

## 2015-11-09 NOTE — Progress Notes (Signed)
Cardiologist: Dr. Mare Ferrari Subjective:  Feels better, less short of breath, no chest pain  Objective:  Vital Signs in the last 24 hours: Temp:  [97.5 F (36.4 C)-97.8 F (36.6 C)] 97.8 F (36.6 C) (03/04 0530) Pulse Rate:  [61-99] 61 (03/04 0530) Resp:  [15-20] 18 (03/04 0530) BP: (101-171)/(43-97) 101/43 mmHg (03/04 1056) SpO2:  [96 %-100 %] 98 % (03/04 0530) Weight:  [153 lb 3.5 oz (69.5 kg)-161 lb (73.029 kg)] 153 lb 3.5 oz (69.5 kg) (03/04 0530)  Intake/Output from previous day: 03/03 0701 - 03/04 0700 In: 570.2 [P.O.:480; I.V.:90.2] Out: 375 [Urine:375]   Physical Exam: General: Well developed, well nourished, elderly in no acute distress. Head:  Normocephalic and atraumatic. Lungs: Clear to auscultation and percussion. Heart: Normal S1 and S2.  No murmur, rubs or gallops.  Abdomen: soft, non-tender, positive bowel sounds. Extremities: No clubbing or cyanosis. No edema. Neurologic: Alert and oriented x 3.    Lab Results:  Recent Labs  11/08/15 1356 11/09/15 0225  WBC 7.7 5.9  HGB 11.2* 9.7*  PLT 236 178    Recent Labs  11/08/15 1356 11/09/15 0225  NA 143 144  K 3.2* 3.7  CL 104 105  CO2 26 28  GLUCOSE 110* 86  BUN 26* 27*  CREATININE 1.85* 1.88*    Recent Labs  11/08/15 2015 11/09/15 0225  TROPONINI 0.57* 0.49*    Imaging: Dg Chest Portable 1 View  11/08/2015  CLINICAL DATA:  Worsening shortness of breath for 1 week and a half EXAM: PORTABLE CHEST 1 VIEW COMPARISON:  07/06/2014 FINDINGS: Cardiomediastinal silhouette is stable. Dual lead cardiac pacemaker is unchanged in position. Atherosclerotic calcifications of thoracic aorta. Stable probable chronic mild interstitial prominence. There is streaky right base medially atelectasis or early infiltrate. Trace right pleural effusion with blunting of the costophrenic angle. Stable chronic left basilar scarring. No pulmonary edema. IMPRESSION: Streaky right base medially atelectasis or early  infiltrate. Trace right pleural effusion. No pulmonary edema. Probable chronic mild interstitial prominence. Electronically Signed   By: Lahoma Crocker M.D.   On: 11/08/2015 15:05   Personally viewed.   Telemetry: Ventricular pacing Personally viewed.   EKG:  Atrial fibrillation, ventricular pacing Personally viewed.  Cardiac Studies:  Echocardiogram 02/26/15  - Left ventricle: The cavity size was normal. Wall thickness was normal. Septal bounce from RV pacing. Systolic function was normal. The estimated ejection fraction was in the range of 55% to 60%. Wall motion was normal; there were no regional wall motion abnormalities. Features are consistent with a pseudonormalleft ventricular filling pattern, with concomitant abnormalrelaxation and increased filling pressure (grade 2 diastolic dysfunction). - Aortic valve: Functionally bicuspid with fused right and left cusps; moderately calcified leaflets. There was mild stenosis. There was trivial regurgitation. Mean gradient (S): 13 mm Hg. Peak gradient (S): 26 mm Hg. - Mitral valve: Mildly calcified annulus. There was mild regurgitation. - Left atrium: The atrium was mildly dilated. - Right ventricle: The cavity size was normal. Pacer wire or catheter noted in right ventricle. Systolic function was normal. - Right atrium: The atrium was mildly dilated. - Inferior vena cava: The vessel was normal in size. The respirophasic diameter changes were in the normal range (>= 50%),consistent with normal central venous pressure.  Impressions:  - Normal LV size and systolic function, EF 0000000. Septal bounce from RV pacing. Normal RV size and systolic function. Mild biatrial enlargement. Bicuspid aortic valve with mild stenosis. Mild mitral regurgitation.   Meds: Scheduled Meds: . aspirin EC  81 mg Oral Daily  . ezetimibe  5 mg Oral Daily  . furosemide  40 mg Intravenous BID  . Influenza vac split quadrivalent PF   0.5 mL Intramuscular Tomorrow-1000  . pregabalin  75 mg Oral BID  . propafenone  225 mg Oral 3 times per day  . sodium chloride flush  3 mL Intravenous Q12H  . verapamil  240 mg Oral Daily   Continuous Infusions: . heparin 1,000 Units/hr (11/09/15 0605)   PRN Meds:.sodium chloride, acetaminophen, ondansetron (ZOFRAN) IV, sodium chloride flush  Assessment/Plan:  Principal Problem:   Acute on chronic diastolic HF (heart failure) (HCC) Active Problems:   Pacemaker- Dual-chamber-mdt   Elevated troponin   Acute on chronic diastolic congestive heart failure (HCC)  Acute on chronic diastolic heart failure -I will give her Lasix 40 mg IV twice a day. -Watch creatinine. 1.8 -Previously her Lasix was cut down secondary to her renal function. -She may require increase Lasix as outpatient. -With mild hypotension, I will hold hydralazine -No longer on lisinopril secondary to renal function  Pacemaker -Functioning well  Paroxysmal atrial fibrillation -On propafenone with recent plan in early February 2017 by Benjaman Pott and Dr. Caryl Comes to change to flecainide 100 mg twice a day which was put on hold secondary to her increased creatinine of 2.6. -Continue verapamil -Currently in A. Fib  Chronic anticoagulation -On warfarin -Consult pharmacy for dosing -Stop heparin when therapeutic  Elevated troponin -Secondary to demand ischemia in the setting of diastolic heart failure -Flat, low level, 0.5, not true ACS    Poet Hineman 11/09/2015, 11:34 AM

## 2015-11-10 ENCOUNTER — Inpatient Hospital Stay (HOSPITAL_COMMUNITY): Payer: Medicare Other

## 2015-11-10 DIAGNOSIS — I509 Heart failure, unspecified: Secondary | ICD-10-CM

## 2015-11-10 LAB — BASIC METABOLIC PANEL
ANION GAP: 10 (ref 5–15)
BUN: 38 mg/dL — AB (ref 6–20)
CALCIUM: 8.7 mg/dL — AB (ref 8.9–10.3)
CO2: 28 mmol/L (ref 22–32)
CREATININE: 2.6 mg/dL — AB (ref 0.44–1.00)
Chloride: 101 mmol/L (ref 101–111)
GFR calc Af Amer: 19 mL/min — ABNORMAL LOW (ref >60)
GFR calc non Af Amer: 17 mL/min — ABNORMAL LOW (ref >60)
GLUCOSE: 99 mg/dL (ref 65–99)
Potassium: 4.1 mmol/L (ref 3.5–5.1)
Sodium: 139 mmol/L (ref 135–145)

## 2015-11-10 LAB — URINE CULTURE

## 2015-11-10 LAB — CBC
HCT: 29.3 % — ABNORMAL LOW (ref 36.0–46.0)
Hemoglobin: 9.1 g/dL — ABNORMAL LOW (ref 12.0–15.0)
MCH: 29 pg (ref 26.0–34.0)
MCHC: 31.1 g/dL (ref 30.0–36.0)
MCV: 93.3 fL (ref 78.0–100.0)
PLATELETS: 173 10*3/uL (ref 150–400)
RBC: 3.14 MIL/uL — AB (ref 3.87–5.11)
RDW: 14.2 % (ref 11.5–15.5)
WBC: 5.7 10*3/uL (ref 4.0–10.5)

## 2015-11-10 LAB — PROTIME-INR
INR: 2.45 — ABNORMAL HIGH (ref 0.00–1.49)
Prothrombin Time: 26.3 seconds — ABNORMAL HIGH (ref 11.6–15.2)

## 2015-11-10 LAB — HEPARIN LEVEL (UNFRACTIONATED): HEPARIN UNFRACTIONATED: 0.39 [IU]/mL (ref 0.30–0.70)

## 2015-11-10 MED ORDER — WARFARIN SODIUM 2.5 MG PO TABS
1.2500 mg | ORAL_TABLET | Freq: Once | ORAL | Status: AC
  Administered 2015-11-10: 1.25 mg via ORAL
  Filled 2015-11-10 (×2): qty 0.5

## 2015-11-10 NOTE — Progress Notes (Signed)
ANTICOAGULATION CONSULT NOTE - Follow Up Consult  Pharmacy Consult for heparin + warfarin Indication: atrial fibrillation  Allergies  Allergen Reactions  . Statins Other (See Comments)    Myalgias,elevated LFT studies  . Shellfish-Derived Products Nausea And Vomiting  . Iohexol Hives and Itching    Itching in eyes, one hive on face.  . Other Hives    Contrast dye   . Penicillins Itching    Patient Measurements: Height: 5\' 6"  (167.6 cm) Weight: 153 lb 3.5 oz (69.5 kg) IBW/kg (Calculated) : 59.3  Vital Signs: Temp: 97.8 F (36.6 C) (03/04 0530) Temp Source: Oral (03/04 0530) BP: 101/43 mmHg (03/04 1056) Pulse Rate: 61 (03/04 0530)  Labs:  Recent Labs  11/08/15 1356 11/08/15 2015 11/09/15 0225  HGB 11.2*  --  9.7*  HCT 35.1*  --  30.6*  PLT 236  --  178  LABPROT 22.1*  --   --   INR 1.95*  --   --   HEPARINUNFRC  --   --  0.12*  CREATININE 1.85*  --  1.88*  TROPONINI 0.49* 0.57* 0.49*    Estimated Creatinine Clearance: 23.1 mL/min (by C-G formula based on Cr of 1.88).   Medications:  Prescriptions prior to admission  Medication Sig Dispense Refill Last Dose  . calcium carbonate (TUMS - DOSED IN MG ELEMENTAL CALCIUM) 500 MG chewable tablet Chew 0.5-1 tablets by mouth daily as needed for indigestion or heartburn.    11/07/2015 at Unknown time  . Cholecalciferol (VITAMIN D-3) 5000 UNITS TABS Take 1 tablet by mouth 4 (four) times a week. Takes on Saturday, Sunday, Tuesday and Thursday   11/07/2015 at Unknown time  . ezetimibe (ZETIA) 10 MG tablet 1/2 tablet by mouth daily (Patient taking differently: Take 5 mg by mouth daily. 1/2 tablet by mouth daily) 45 tablet 1 11/07/2015 at Unknown time  . furosemide (LASIX) 40 MG tablet Take 20 mg by mouth daily. Take 1/2 tablet (20 mg) by mouth daily   11/07/2015 at Unknown time  . hydrALAZINE (APRESOLINE) 25 MG tablet Take 25 mg by mouth 3 (three) times daily.    11/07/2015 at Unknown time  . pregabalin (LYRICA) 75 MG capsule Take 75  mg by mouth 2 (two) times daily.   11/07/2015 at Unknown time  . propafenone (RYTHMOL) 225 MG tablet Take 225 mg by mouth every 8 (eight) hours.   11/07/2015 at Unknown time  . verapamil (CALAN-SR) 240 MG CR tablet Take 240 mg by mouth daily.   11/07/2015 at Unknown time  . warfarin (COUMADIN) 2.5 MG tablet Take as directed by coumadin clinic (Patient taking differently: Takes 2.5mg  on wed only Takes 1.25mg  all other days) 30 tablet 3 11/07/2015 at Unknown time  . KLOR-CON M10 10 MEQ tablet TAKE 1 TABLET (10 MEQ TOTAL) BY MOUTH DAILY. 30 tablet 9 stopped at past week    Assessment: 81 yo F admitted 11/08/2015 with chest pain. Patient on coumadin PTA for Afib and bridging with heparin. Pharmacy consulted to dose heparin and coumadin.  Warfarin PTA dose: 2.5 mg on Wed, 1.25 mg all other days  (Last dose PTA 3/2).   HL 0.39 (therapeutic), INR 2.45 (therapeutic), Hgb 11.2>9.7>9.1, Plt wnl. No s/sx of bleeding noted.  Goal of Therapy:  INR 2-3 Heparin level 0.3-0.7 units/ml Monitor platelets by anticoagulation protocol: Yes   Plan:   - Warfarin 1.25 mg x1 tonight - Stop heparin drip since INR therapeutic - Monitor daily INR, HL, CBC and s/sx of bleeding  Lovena Le  Joaquim Lai, PharmD. PGY-1 Pharmacy Resident Pager: [phone] 11/10/2015, 7:57 AM

## 2015-11-10 NOTE — Progress Notes (Signed)
Cardiologist: Dr. Mare Ferrari Subjective:   Feels better, incontinent of urine this is been a long-standing issue, did not have any significant shortness of breath when going to the bathroom today however creatinine has increased back to 2.6.   Objective:  Vital Signs in the last 24 hours: Temp:  [97.4 F (36.3 C)-98.2 F (36.8 C)] 97.4 F (36.3 C) (03/05 0552) Pulse Rate:  [60-66] 66 (03/05 0552) Resp:  [20] 20 (03/05 0552) BP: (96-119)/(43-59) 119/59 mmHg (03/05 1010) SpO2:  [97 %-100 %] 100 % (03/05 0552) Weight:  [159 lb (72.122 kg)] 159 lb (72.122 kg) (03/05 0552)  Intake/Output from previous day: 03/04 0701 - 03/05 0700 In: 1256.3 [P.O.:1020; I.V.:236.3] Out: 325 [Urine:325]   Physical Exam: General: Well developed, well nourished, elderly in no acute distress. Head:  Normocephalic and atraumatic. Lungs: Clear to auscultation and percussion. Heart: Irregularly irregular.  No murmur, rubs or gallops.  Abdomen: soft, non-tender, positive bowel sounds. Extremities: No clubbing or cyanosis. No edema. Neurologic: Alert and oriented x 3.    Lab Results:  Recent Labs  11/09/15 0225 11/10/15 0410  WBC 5.9 5.7  HGB 9.7* 9.1*  PLT 178 173    Recent Labs  11/09/15 0225 11/10/15 0410  NA 144 139  K 3.7 4.1  CL 105 101  CO2 28 28  GLUCOSE 86 99  BUN 27* 38*  CREATININE 1.88* 2.60*    Recent Labs  11/09/15 0225 11/09/15 0938  TROPONINI 0.49* 0.26*    Imaging: Dg Chest Portable 1 View  11/08/2015  CLINICAL DATA:  Worsening shortness of breath for 1 week and a half EXAM: PORTABLE CHEST 1 VIEW COMPARISON:  07/06/2014 FINDINGS: Cardiomediastinal silhouette is stable. Dual lead cardiac pacemaker is unchanged in position. Atherosclerotic calcifications of thoracic aorta. Stable probable chronic mild interstitial prominence. There is streaky right base medially atelectasis or early infiltrate. Trace right pleural effusion with blunting of the costophrenic angle.  Stable chronic left basilar scarring. No pulmonary edema. IMPRESSION: Streaky right base medially atelectasis or early infiltrate. Trace right pleural effusion. No pulmonary edema. Probable chronic mild interstitial prominence. Electronically Signed   By: Lahoma Crocker M.D.   On: 11/08/2015 15:05   Personally viewed.   Telemetry: Ventricular pacing, underlying atrial fibrillation Personally viewed.   EKG:  Atrial fibrillation, ventricular pacing Personally viewed.  Cardiac Studies:  Echocardiogram 02/26/15  - Left ventricle: The cavity size was normal. Wall thickness was normal. Septal bounce from RV pacing. Systolic function was normal. The estimated ejection fraction was in the range of 55% to 60%. Wall motion was normal; there were no regional wall motion abnormalities. Features are consistent with a pseudonormalleft ventricular filling pattern, with concomitant abnormalrelaxation and increased filling pressure (grade 2 diastolic dysfunction). - Aortic valve: Functionally bicuspid with fused right and left cusps; moderately calcified leaflets. There was mild stenosis. There was trivial regurgitation. Mean gradient (S): 13 mm Hg. Peak gradient (S): 26 mm Hg. - Mitral valve: Mildly calcified annulus. There was mild regurgitation. - Left atrium: The atrium was mildly dilated. - Right ventricle: The cavity size was normal. Pacer wire or catheter noted in right ventricle. Systolic function was normal. - Right atrium: The atrium was mildly dilated. - Inferior vena cava: The vessel was normal in size. The respirophasic diameter changes were in the normal range (>= 50%),consistent with normal central venous pressure.  Impressions:  - Normal LV size and systolic function, EF 0000000. Septal bounce from RV pacing. Normal RV size and systolic function. Mild biatrial  enlargement. Bicuspid aortic valve with mild stenosis. Mild mitral  regurgitation.   Meds: Scheduled Meds: . aspirin EC  81 mg Oral Daily  . ezetimibe  5 mg Oral Daily  . furosemide  40 mg Intravenous BID  . Influenza vac split quadrivalent PF  0.5 mL Intramuscular Tomorrow-1000  . potassium chloride  20 mEq Oral Daily  . pregabalin  75 mg Oral BID  . propafenone  225 mg Oral 3 times per day  . sodium chloride flush  3 mL Intravenous Q12H  . verapamil  240 mg Oral Daily  . warfarin  1.25 mg Oral ONCE-1800  . Warfarin - Pharmacist Dosing Inpatient   Does not apply q1800   Continuous Infusions:   PRN Meds:.sodium chloride, acetaminophen, ondansetron (ZOFRAN) IV, sodium chloride flush  Assessment/Plan:  Principal Problem:   Acute on chronic diastolic HF (heart failure) (HCC) Active Problems:   Pacemaker- Dual-chamber-mdt   Elevated troponin   Acute on chronic diastolic congestive heart failure (HCC)  Acute on chronic diastolic heart failure -Yesterday she received Lasix 40 mg IV twice a day however her creatinine now went from 1.8 up to 2.6. We will stop Lasix. -Previously her Lasix was cut down secondary to her renal function. -She may require increase Lasix as outpatient, however renal function has been very challenging to control -With mild hypotension, I will hold hydralazine. She is on verapamil 240 mg for atrial fibrillation heart rate control. -No longer on lisinopril secondary to renal function -We have verified that decreasing her intravascular volume has made her feel better overall however her renal function takes a hit. Perhaps stronger trial at restoration of sinus rhythm would prove beneficial as well.  Pacemaker -Functioning well  Paroxysmal atrial fibrillation -On propafenone with recent plan in early February 2017 by Benjaman Pott and Dr. Caryl Comes to change to flecainide 100 mg twice a day which was put on hold secondary to her increased creatinine of 2.6. -Continue verapamil -Currently in A. FibWhich is likely also exacerbating  her acute on chronic diastolic heart   -Tomorrow, could consider EP (Dr. Caryl Comes) consultation. Question if they feel comfortable starting flecainide now or perhaps we need to try amiodarone to try to restore normal rhythm?  -INR today 2.45, therapeutic, on admission 1.95 slightly subtherapeutic.   Chronic anticoagulation -On warfarin -Consult pharmacy for dosing -Stop heparin when therapeutic  Elevated troponin -Secondary to demand ischemia in the setting of diastolic heart failure -Flat, low level, 0.5, not true ACS  Urinary incontinence -Long-standing issue -Sees urology    Mackinzee Roszak, [place] 11/10/2015, 10:38 AM

## 2015-11-10 NOTE — Discharge Instructions (Signed)

## 2015-11-11 ENCOUNTER — Inpatient Hospital Stay (HOSPITAL_COMMUNITY): Payer: Medicare Other

## 2015-11-11 DIAGNOSIS — N189 Chronic kidney disease, unspecified: Secondary | ICD-10-CM

## 2015-11-11 DIAGNOSIS — I429 Cardiomyopathy, unspecified: Secondary | ICD-10-CM

## 2015-11-11 DIAGNOSIS — N179 Acute kidney failure, unspecified: Secondary | ICD-10-CM

## 2015-11-11 DIAGNOSIS — I5043 Acute on chronic combined systolic (congestive) and diastolic (congestive) heart failure: Principal | ICD-10-CM

## 2015-11-11 LAB — CBC
HCT: 30.8 % — ABNORMAL LOW (ref 36.0–46.0)
HEMOGLOBIN: 9.5 g/dL — AB (ref 12.0–15.0)
MCH: 28.8 pg (ref 26.0–34.0)
MCHC: 30.8 g/dL (ref 30.0–36.0)
MCV: 93.3 fL (ref 78.0–100.0)
Platelets: 197 10*3/uL (ref 150–400)
RBC: 3.3 MIL/uL — AB (ref 3.87–5.11)
RDW: 14 % (ref 11.5–15.5)
WBC: 7.1 10*3/uL (ref 4.0–10.5)

## 2015-11-11 LAB — BASIC METABOLIC PANEL
ANION GAP: 11 (ref 5–15)
BUN: 43 mg/dL — ABNORMAL HIGH (ref 6–20)
CALCIUM: 8.4 mg/dL — AB (ref 8.9–10.3)
CO2: 24 mmol/L (ref 22–32)
Chloride: 100 mmol/L — ABNORMAL LOW (ref 101–111)
Creatinine, Ser: 2.79 mg/dL — ABNORMAL HIGH (ref 0.44–1.00)
GFR, EST AFRICAN AMERICAN: 18 mL/min — AB (ref >60)
GFR, EST NON AFRICAN AMERICAN: 15 mL/min — AB (ref >60)
GLUCOSE: 99 mg/dL (ref 65–99)
POTASSIUM: 4.1 mmol/L (ref 3.5–5.1)
SODIUM: 135 mmol/L (ref 135–145)

## 2015-11-11 LAB — PROTIME-INR
INR: 2.28 — AB (ref 0.00–1.49)
PROTHROMBIN TIME: 24.9 s — AB (ref 11.6–15.2)

## 2015-11-11 MED ORDER — WARFARIN SODIUM 2.5 MG PO TABS
1.2500 mg | ORAL_TABLET | Freq: Once | ORAL | Status: AC
  Administered 2015-11-11: 1.25 mg via ORAL
  Filled 2015-11-11: qty 0.5

## 2015-11-11 MED ORDER — METOPROLOL TARTRATE 50 MG PO TABS: 50.0000 mg | ORAL_TABLET | Freq: Two times a day (BID) | ORAL | Status: DC

## 2015-11-11 MED ORDER — AMIODARONE HCL 200 MG PO TABS
400.0000 mg | ORAL_TABLET | Freq: Two times a day (BID) | ORAL | Status: DC
  Administered 2015-11-11 – 2015-11-16 (×10): 400 mg via ORAL
  Filled 2015-11-11 (×10): qty 2

## 2015-11-11 MED ORDER — METOPROLOL SUCCINATE ER 25 MG PO TB24
25.0000 mg | ORAL_TABLET | Freq: Two times a day (BID) | ORAL | Status: DC
  Administered 2015-11-11 – 2015-11-16 (×11): 25 mg via ORAL
  Filled 2015-11-11 (×12): qty 1

## 2015-11-11 NOTE — Consult Note (Signed)
ELECTROPHYSIOLOGY CONSULT NOTE    Patient ID: Yolanda Spears MRN: [id number], DOB/AGE: [birth year] 81 y.o.  Admit date: 11/08/2015 Date of Consult: 11/11/2015  Primary Physician: Tawanna Solo, MD Primary Cardiologist: Dr. Mare Ferrari Electrophysiologist: Dr. Caryl Comes  Reason for Consultation: Persistent Afib  HPI: Yolanda Spears is a 81 y.o. female admitted to [facility] 11/08/15 with increaseing DOE, admitted with CHF exacerbation.  Her PMHx includes recently more persistent AFib, tachy-brady and hx of AV block with PPM, CVA, breast Ca treated surgically, HTN,Cdiff requiring fecal transplant, chronic edema, of late her diuretic has been down titrated with increased creatinine.  Her labs this admission: Trop 0.49, 0.57, 0.49, 0.26 Creat 1.85 >> 2.79 TSH wnl, 0.068 Ddimer elevated 0.72 (normal 0.00-0.50)  She has been treated with IV lasix with improved symptoms, though I/O appears to be fluid + EP is being consulted to evaluate her rhythm control options with suspect contributes to her CHF.     AFib treatment history: **Feb 2017 discussed changing Propafenone to Flecainide though not done secondary to renal function. **January 2017 Verapamil with Propafenone (concern BB contributes to fatigue ?diarrhea) **Started prior to 2011 Metoprolol with propafenone   Past Medical History  Diagnosis Date  . Hypertension   . Peripheral neuropathy ([place])   . High cholesterol   . Persistent atrial fibrillation ([place])   . History of radiation therapy   . Partial bowel obstruction (HCC) JAN/2013    recurrent  . Colon polyp 2011  . Osteopenia 02/2013    T score -1.4 FRAX 11%/2.1%  . Stroke [facility])     "small stroke & several TIA's occasional visual problems / and occasional speach problems  . Arthritis   . Personal history of skin cancer   . Sleep apnea     unaable to tolerate mask   . Endometrial cancer ([place]) 2006    Stage IIIc  s/p radiation  . Breast cancer ([place]) 1999    LOBULAR CARCINOMA IN  SITU...  . Small bowel obstruction due to adhesions ([place]) 12/28/2013  . Internal hemorrhoids   . Recurrent Clostridium difficile diarrhea   . Mobitz II     a. s/p MDT PPM      Surgical History:  Past Surgical History  Procedure Laterality Date  . Lymph node resection  2007  . Cholecystectomy  1990's  . Mastectomy  1999    bilaterally w/lymph node dissection  . Abdominal hysterectomy  2006    BSO  . Oophorectomy      BSO  . Exploratory laparotomy  2007    remove abdominal cyst  . Insert / replace / remove pacemaker  2007    initial placement  2007 / REPLACED 2014     . Laparoscopic lysis intestinal adhesions  12/28/2013  . Abdominal adhesion surgery  12/28/2013  . Small intestine surgery  12/28/2013  . Laparoscopic lysis of adhesions N/A 12/28/2013    Procedure: LAPAROSCOPIC LYSIS OF ADHESIONS converted OPEN SMALL BOWEL;  Surgeon: Adin Hector, MD;  Location: [facility] ORS;  Service: General;  Laterality: N/A;  . Permanent pacemaker generator change N/A 03/23/2012    Procedure: PERMANENT PACEMAKER GENERATOR CHANGE;  Surgeon: Deboraha Sprang, MD;  Location: [facility] CATH LAB;  Service: Cardiovascular;  Laterality: N/A;  . Colonoscopy with propofol N/A 12/27/2014    Procedure: COLONOSCOPY WITH PROPOFOL;  Surgeon: Jerene Bears, MD;  Location: [facility] ENDOSCOPY;  Service: Gastroenterology;  Laterality: N/A;  . Fecal transplant N/A 12/27/2014    Procedure: FECAL TRANSPLANT;  Surgeon:  Jerene Bears, MD;  Location: [facility];  Service: Gastroenterology;  Laterality: N/A;     Prescriptions prior to admission  Medication Sig Dispense Refill Last Dose  . calcium carbonate (TUMS - DOSED IN MG ELEMENTAL CALCIUM) 500 MG chewable tablet Chew 0.5-1 tablets by mouth daily as needed for indigestion or heartburn.    11/07/2015 at Unknown time  . Cholecalciferol (VITAMIN D-3) 5000 UNITS TABS Take 1 tablet by mouth 4 (four) times a week. Takes on Saturday, Sunday, Tuesday and Thursday   11/07/2015 at Unknown time  .  ezetimibe (ZETIA) 10 MG tablet 1/2 tablet by mouth daily (Patient taking differently: Take 5 mg by mouth daily. 1/2 tablet by mouth daily) 45 tablet 1 11/07/2015 at Unknown time  . furosemide (LASIX) 40 MG tablet Take 20 mg by mouth daily. Take 1/2 tablet (20 mg) by mouth daily   11/07/2015 at Unknown time  . hydrALAZINE (APRESOLINE) 25 MG tablet Take 25 mg by mouth 3 (three) times daily.    11/07/2015 at Unknown time  . pregabalin (LYRICA) 75 MG capsule Take 75 mg by mouth 2 (two) times daily.   11/07/2015 at Unknown time  . propafenone (RYTHMOL) 225 MG tablet Take 225 mg by mouth every 8 (eight) hours.   11/07/2015 at Unknown time  . verapamil (CALAN-SR) 240 MG CR tablet Take 240 mg by mouth daily.   11/07/2015 at Unknown time  . warfarin (COUMADIN) 2.5 MG tablet Take as directed by coumadin clinic (Patient taking differently: Takes 2.5mg  on wed only Takes 1.25mg  all other days) 30 tablet 3 11/07/2015 at Unknown time  . KLOR-CON M10 10 MEQ tablet TAKE 1 TABLET (10 MEQ TOTAL) BY MOUTH DAILY. 30 tablet 9 stopped at past week    Inpatient Medications:  . aspirin EC  81 mg Oral Daily  . ezetimibe  5 mg Oral Daily  . Influenza vac split quadrivalent PF  0.5 mL Intramuscular Tomorrow-1000  . pregabalin  75 mg Oral BID  . propafenone  225 mg Oral 3 times per day  . sodium chloride flush  3 mL Intravenous Q12H  . verapamil  240 mg Oral Daily  . Warfarin - Pharmacist Dosing Inpatient   Does not apply q1800    Allergies:  Allergies  Allergen Reactions  . Statins Other (See Comments)    Myalgias,elevated LFT studies  . Shellfish-Derived Products Nausea And Vomiting  . Iohexol Hives and Itching    Itching in eyes, one hive on face.  . Other Hives    Contrast dye   . Penicillins Itching    Social History   Social History  . Marital Status: Widowed    Spouse Name: N/A  . Number of Children: 2  . Years of Education: N/A   Occupational History  . Retired    Social History Main Topics  . Smoking  status: Former Smoker -- 1.00 packs/day for 40 years    Types: Cigarettes    Quit date: 11/26/1998  . Smokeless tobacco: Never Used  . Alcohol Use: No     Comment: 09/14/11 "last drink ~ 08/31/2009"  . Drug Use: No  . Sexual Activity: No   Other Topics Concern  . Not on file   Social History Narrative   Widowed mother of 2     Family History  Problem Relation Age of Onset  . Cancer Mother 4    UTERINE  . Breast cancer Mother 27  . Hypertension Mother   . Uterine cancer Mother   .  Cancer Father     PROSTATE  . Hypertension Father   . Colon cancer Neg Hx   . Esophageal cancer Neg Hx   . Rectal cancer Neg Hx   . Stomach cancer Neg Hx      Review of Systems: All other systems reviewed and are otherwise negative except as noted above.  Physical Exam: Filed Vitals:   11/10/15 1331 11/10/15 2103 11/10/15 2200 11/11/15 0500  BP: 112/54 118/49  130/55  Pulse: 61 59 81 65  Temp: 97.7 F (36.5 C) 98 F (36.7 C)  97.7 F (36.5 C)  TempSrc: Oral Oral  Oral  Resp: 20 20  20   Height:      Weight:    161 lb 9.6 oz (73.301 kg)  SpO2: 94% 98%       GEN- The patient is well appearing, alert and oriented x 3 today.   HEENT: normocephalic, atraumatic; sclera clear, conjunctiva pink; hearing intact; oropharynx clear; neck supple, JVP 8 Lymph- no cervical lymphadenopathy Lungs-Decreased BS R Heart- Regular rate and rhythm, 2/6 M GI- soft, non-tender, non-distended, bowel sounds present Extremities- no clubbing, cyanosis, or edema; DP/PT/radial pulses 2+ bilaterally MS- no significant deformity or atrophy Skin- warm and dry, no rash or lesion Psych- euthymic mood, full affect Neuro- no gross deficits observed  Labs:   Lab Results  Component Value Date   WBC 7.1 11/11/2015   HGB 9.5* 11/11/2015   HCT 30.8* 11/11/2015   MCV 93.3 11/11/2015   PLT 197 11/11/2015    Recent Labs Lab 11/11/15 0308  NA 135  K 4.1  CL 100*  CO2 24  BUN 43*  CREATININE 2.79*  CALCIUM  8.4*  GLUCOSE 99      Radiology/Studies:  Dg Chest Portable 1 View 11/08/2015  CLINICAL DATA:  Worsening shortness of breath for 1 week and a half EXAM: PORTABLE CHEST 1 VIEW COMPARISON:  07/06/2014 FINDINGS: Cardiomediastinal silhouette is stable. Dual lead cardiac pacemaker is unchanged in position. Atherosclerotic calcifications of thoracic aorta. Stable probable chronic mild interstitial prominence. There is streaky right base medially atelectasis or early infiltrate. Trace right pleural effusion with blunting of the costophrenic angle. Stable chronic left basilar scarring. No pulmonary edema. IMPRESSION: Streaky right base medially atelectasis or early infiltrate. Trace right pleural effusion. No pulmonary edema. Probable chronic mild interstitial prominence. Electronically Signed   By: Lahoma Crocker M.D.   On: 11/08/2015 15:05    EKG: AFib, IVCD, T changes II, III, aVf, V3-6, prior EKG's paced rhythm, May 2016 non-paced rhythm is without T changes TELEMETRY: afib with slow VP and pacing  11/10/15: Echocardiogram Study Conclusions - Left ventricle: The cavity size was normal. Wall thickness was normal. Systolic function was moderately reduced. The estimated ejection fraction was in the range of 35% to 40%. There is akinesis of the apicalanteroseptal and apical myocardium. - Aortic valve: Valve mobility was restricted. There was moderate stenosis. There was mild regurgitation. Peak velocity (S): 301 cm/s. Mean gradient (S): 21 mm Hg. - Mitral valve: Calcified annulus. Mildly thickened leaflets . There was mild regurgitation. - Tricuspid valve: There was moderate regurgitation. - Pulmonary arteries: Systolic pressure was moderately increased. PA peak pressure: 48 mm Hg (S). - Pericardium, extracardiac: There was a moderate-sized left pleural effusion. Impressions: - Since last ECHO - wall motion abnormality is new.  02/26/15: Echocardiogram Study Conclusions - Left  ventricle: The cavity size was normal. Wall thickness was normal. Septal bounce from RV pacing. Systolic function was normal. The estimated ejection  fraction was in the range of 55% to 60%. Wall motion was normal; there were no regional wall motion abnormalities. Features are consistent with a pseudonormal left ventricular filling pattern, with concomitant abnormal relaxation and increased filling pressure (grade 2 diastolic dysfunction). - Aortic valve: Functionally bicuspid with fused right and left cusps; moderately calcified leaflets. There was mild stenosis. There was trivial regurgitation. Mean gradient (S): 13 mm Hg. Peak gradient (S): 26 mm Hg. - Mitral valve: Mildly calcified annulus. There was mild regurgitation. - Left atrium: The atrium was mildly dilated. - Right ventricle: The cavity size was normal. Pacer wire or catheter noted in right ventricle. Systolic function was normal. - Right atrium: The atrium was mildly dilated. - Inferior vena cava: The vessel was normal in size. The respirophasic diameter changes were in the normal range (>= 50%), consistent with normal central venous pressure. Impressions: - Normal LV size and systolic function, EF 0000000. Septal bounce from RV pacing. Normal RV size and systolic function. Mild biatrial enlargement. Bicuspid aortic valve with mild stenosis. Mild mitral regurgitation.   DEVICE HISTORY: MDT dual chamber PPM, 03/23/12, Dr. Caryl Comes (original device 2007) for AVblock  Assessment and Plan:  1. Persitent AFib/tachy-brady with PPM      CHA2DS2Vasc is at least 5 on warfarin, INR today is 2.28   rate appears controlled     On Propafenone/verapamil/warfarin  2. CHF      EF down to 35-40% with WMA on her echo from 55-60% without WMA in Jun 2016      Trop not felt to be ACS by cardiology      Fluid + despite IV furosemide, d/c yesterday with worsening renal function  3. HTN     Relative  hypotension and hydralazine held here with stable BP          Signed, Tommye Standard, PA-C 11/11/2015 6:58 AM  Notes reviewed Pt seen and examined JVP 8 Decreased BS R lung RRR with 99991111 Systolic M retained S2 split Scant edema llying in bed   1)  CHF Acute/chronic 2) New LV dysfunction with wall motion abnormality 3) persistent Atrial Fibrillation 4) Renal Insufficiency Acute.chronic 5) Pacemaker Medtronic 6) Abnormal CXR 7) decreased BS  8) Debility  Multiple issues Diuretics are on hold Will stop ASA and Verapamil, the former prob not necessary in context of warfarin and the latter potentially a renal toxin and a neg inotrope Resume Betablocker Will repeat CXR With new WMA will undertake myoview to look for ischemia The new cardiomyopathy precludes the use of 1C agent so will stop propafenone  Begin amiodarone once we get CXR back--with renal issues not a candidate for dofetilide either Cause of cardiomyopathy not clear, but could be ischemia, could be high volume RV pacing during Afib, less likely AFRVR based on device data  OT/PT consult

## 2015-11-11 NOTE — Progress Notes (Signed)
Pharmacist Heart Failure Core Measure Documentation  Assessment: Yolanda Spears has an EF documented as 35-40% on 11/10/2015 by Echo.  Rationale: Heart failure patients with left ventricular systolic dysfunction (LVSD) and an EF < 40% should be prescribed an angiotensin converting enzyme inhibitor (ACEI) or angiotensin receptor blocker (ARB) at discharge unless a contraindication is documented in the medical record.  This patient is not currently on an ACEI or ARB for HF.  This note is being placed in the record in order to provide documentation that a contraindication to the use of these agents is present for this encounter.  ACE Inhibitor or Angiotensin Receptor Blocker is contraindicated (specify all that apply)  []   ACEI allergy AND ARB allergy []   Angioedema []   Moderate or severe aortic stenosis []   Hyperkalemia []   Hypotension []   Renal artery stenosis [x]   Worsening renal function, preexisting renal disease or dysfunction   Manley Mason 11/11/2015 12:08 PM

## 2015-11-11 NOTE — Care Management Important Message (Signed)
Important Message  Patient Details  Name: Yolanda Spears MRN: [id number] Date of Birth: 14-Mar-1935   Medicare Important Message Given:  Yes    Mela Perham P Zarahi Fuerst 11/11/2015, 3:00 PM

## 2015-11-11 NOTE — Progress Notes (Signed)
ANTICOAGULATION CONSULT NOTE - Follow Up Consult  Pharmacy Consult for warfarin Indication: atrial fibrillation  Allergies  Allergen Reactions  . Statins Other (See Comments)    Myalgias,elevated LFT studies  . Shellfish-Derived Products Nausea And Vomiting  . Iohexol Hives and Itching    Itching in eyes, one hive on face.  . Other Hives    Contrast dye   . Penicillins Itching    Patient Measurements: Height: 5\' 6"  (167.6 cm) Weight: 153 lb 3.5 oz (69.5 kg) IBW/kg (Calculated) : 59.3  Vital Signs: Temp: 97.8 F (36.6 C) (03/04 0530) Temp Source: Oral (03/04 0530) BP: 101/43 mmHg (03/04 1056) Pulse Rate: 61 (03/04 0530)  Labs:  Recent Labs  11/08/15 1356 11/08/15 2015 11/09/15 0225  HGB 11.2*  --  9.7*  HCT 35.1*  --  30.6*  PLT 236  --  178  LABPROT 22.1*  --   --   INR 1.95*  --   --   HEPARINUNFRC  --   --  0.12*  CREATININE 1.85*  --  1.88*  TROPONINI 0.49* 0.57* 0.49*    Estimated Creatinine Clearance: 23.1 mL/min (by C-G formula based on Cr of 1.88).   Assessment: 81 yo F admitted 11/08/2015 with chest pain. Patient is on coumadin for Afib.  INR 2.28 (therapeutic), Hgb 9.5, Plt wnl. No s/sx of bleeding noted.  Warfarin PTA dose: 2.5 mg on Wed, 1.25 mg all other days  (Last dose PTA 3/2).    Goal of Therapy:  INR 2-3 Heparin level 0.3-0.7 units/ml Monitor platelets by anticoagulation protocol: Yes   Plan:   - Warfarin 1.25 mg x1 tonight - Monitor daily INR, HL, CBC and s/sx of bleeding  Maryanna Shape, PharmD, BCPS  Clinical Pharmacist  Pager: [phone]   11/11/2015, 11:55 AM

## 2015-11-11 NOTE — Progress Notes (Signed)
Amion paged PA, EP, to inform that stress test cannot be performed today, as orders were placed at 0834, and pt had already eaten breakfast.   Stress test will be performed tomorrow, 11/12/15, and pt will be NPO at midnight 11/11/15.

## 2015-11-12 ENCOUNTER — Inpatient Hospital Stay (HOSPITAL_COMMUNITY): Payer: Self-pay

## 2015-11-12 ENCOUNTER — Inpatient Hospital Stay (HOSPITAL_COMMUNITY): Payer: Medicare Other

## 2015-11-12 DIAGNOSIS — R079 Chest pain, unspecified: Secondary | ICD-10-CM

## 2015-11-12 LAB — NM MYOCAR MULTI W/SPECT W/WALL MOTION / EF
CHL CUP RESTING HR STRESS: 81 {beats}/min
CHL RATE OF PERCEIVED EXERTION: 0
CSEPED: 0 min
CSEPEDS: 0 s
CSEPEW: 1 METS
CSEPPHR: 86 {beats}/min
MPHR: 142 {beats}/min
Percent HR: 60 %

## 2015-11-12 LAB — CBC
HCT: 29.4 % — ABNORMAL LOW (ref 36.0–46.0)
Hemoglobin: 9.6 g/dL — ABNORMAL LOW (ref 12.0–15.0)
MCH: 30.3 pg (ref 26.0–34.0)
MCHC: 32.7 g/dL (ref 30.0–36.0)
MCV: 92.7 fL (ref 78.0–100.0)
PLATELETS: 172 10*3/uL (ref 150–400)
RBC: 3.17 MIL/uL — ABNORMAL LOW (ref 3.87–5.11)
RDW: 14.1 % (ref 11.5–15.5)
WBC: 6 10*3/uL (ref 4.0–10.5)

## 2015-11-12 LAB — PROTIME-INR
INR: 2.33 — AB (ref 0.00–1.49)
PROTHROMBIN TIME: 25.3 s — AB (ref 11.6–15.2)

## 2015-11-12 LAB — BASIC METABOLIC PANEL
Anion gap: 9 (ref 5–15)
BUN: 47 mg/dL — AB (ref 6–20)
CALCIUM: 8.8 mg/dL — AB (ref 8.9–10.3)
CO2: 28 mmol/L (ref 22–32)
Chloride: 102 mmol/L (ref 101–111)
Creatinine, Ser: 2.34 mg/dL — ABNORMAL HIGH (ref 0.44–1.00)
GFR calc Af Amer: 22 mL/min — ABNORMAL LOW (ref >60)
GFR, EST NON AFRICAN AMERICAN: 19 mL/min — AB (ref >60)
GLUCOSE: 91 mg/dL (ref 65–99)
Potassium: 4.4 mmol/L (ref 3.5–5.1)
Sodium: 139 mmol/L (ref 135–145)

## 2015-11-12 LAB — T4, FREE: Free T4: 1.31 ng/dL — ABNORMAL HIGH (ref 0.61–1.12)

## 2015-11-12 MED ORDER — TECHNETIUM TC 99M SESTAMIBI GENERIC - CARDIOLITE
10.0000 | Freq: Once | INTRAVENOUS | Status: AC | PRN
  Administered 2015-11-12: 10 via INTRAVENOUS

## 2015-11-12 MED ORDER — REGADENOSON 0.4 MG/5ML IV SOLN
0.4000 mg | Freq: Once | INTRAVENOUS | Status: AC
  Administered 2015-11-12: 0.4 mg via INTRAVENOUS
  Filled 2015-11-12: qty 5

## 2015-11-12 MED ORDER — FUROSEMIDE 10 MG/ML IJ SOLN
40.0000 mg | Freq: Two times a day (BID) | INTRAMUSCULAR | Status: AC
  Administered 2015-11-12 (×2): 40 mg via INTRAVENOUS
  Filled 2015-11-12 (×2): qty 4

## 2015-11-12 MED ORDER — TECHNETIUM TC 99M SESTAMIBI GENERIC - CARDIOLITE
30.0000 | Freq: Once | INTRAVENOUS | Status: AC | PRN
  Administered 2015-11-12: 30 via INTRAVENOUS

## 2015-11-12 MED ORDER — WARFARIN SODIUM 2.5 MG PO TABS
1.2500 mg | ORAL_TABLET | Freq: Once | ORAL | Status: AC
  Administered 2015-11-12: 1.25 mg via ORAL
  Filled 2015-11-12: qty 0.5

## 2015-11-12 NOTE — Care Management Note (Signed)
Case Management Note  Patient Details  Name: Yolanda Spears MRN: [id number] Date of Birth: June 10, 1935  Subjective/Objective:     Admitted with CHF               Action/Plan: Patient lives alone, has a personal care service with [facility], they do errands for her and her sister does the grocery shopping for her. Private insurance with Medicare; pharmacy of choice is [facility], patient reports no problem getting her medication. She has a walker and a cane at home, no home oxygen at this time. Patient would benefit from a Disease Management Program for CHF, patient chose [facility] with Yolanda Spears called for arrangements. Patient also is agreeable to the [place] program for CHF, referral made as requested. Attending MD, at discharge please enter the face to face form in Epic for [facility] services.  Expected Discharge Date:    possibly 11/14/2015              Expected Discharge Plan:  [place]  In-House Referral:   [facility]  Discharge planning Services  CM Consult  Post Acute Care Choice:    Choice offered to:  Patient  HH Arranged:  RN, Disease Management, PT HH Agency:  [facility]  Status of Service:  In process, will continue to follow  Medicare Important Message Given:  Yes  Yolanda Spears [id number] 11/12/2015, 12:16 PM

## 2015-11-12 NOTE — Progress Notes (Addendum)
ANTICOAGULATION CONSULT NOTE - Follow Up Consult  Pharmacy Consult for warfarin Indication: atrial fibrillation  Allergies  Allergen Reactions  . Statins Other (See Comments)    Myalgias,elevated LFT studies  . Shellfish-Derived Products Nausea And Vomiting  . Iohexol Hives and Itching    Itching in eyes, one hive on face.  . Other Hives    Contrast dye   . Penicillins Itching    Patient Measurements: Height: 5\' 6"  (167.6 cm) Weight: 153 lb 3.5 oz (69.5 kg) IBW/kg (Calculated) : 59.3  Vital Signs: Temp: 97.8 F (36.6 C) (03/04 0530) Temp Source: Oral (03/04 0530) BP: 101/43 mmHg (03/04 1056) Pulse Rate: 61 (03/04 0530)  Labs:  Recent Labs  11/08/15 1356 11/08/15 2015 11/09/15 0225  HGB 11.2*  --  9.7*  HCT 35.1*  --  30.6*  PLT 236  --  178  LABPROT 22.1*  --   --   INR 1.95*  --   --   HEPARINUNFRC  --   --  0.12*  CREATININE 1.85*  --  1.88*  TROPONINI 0.49* 0.57* 0.49*    Estimated Creatinine Clearance: 23.1 mL/min (by C-G formula based on Cr of 1.88).   Assessment: 81 yo F admitted 11/08/2015 with chest pain. Patient is on coumadin for Afib. INR 2.33 (therapeutic). Noted started amiodarone on 3/6. Hgb 9.6, Plt wnl. No s/sx of bleeding noted.   Warfarin PTA dose: 2.5 mg on Wed, 1.25 mg all other days  (Last dose PTA 3/2).    Goal of Therapy:  INR 2-3 Monitor platelets by anticoagulation protocol: Yes   Plan:   - Warfarin 1.25 mg x1 tonight - Monitor daily INR, HL, CBC and s/sx of bleeding  Maryanna Shape, PharmD, BCPS  Clinical Pharmacist  Pager: [phone]   11/12/2015, 12:04 PM

## 2015-11-12 NOTE — Progress Notes (Signed)
PT Cancellation Note  Patient Details Name: Yolanda Spears MRN: [id number] DOB: 12/07/1934   Cancelled Treatment:    Reason Eval/Treat Not Completed: Patient at procedure or test/unavailable (Stress test).  Try later as pt and time allow.   Ramond Dial 11/12/2015, 8:21 AM   Mee Hives, PT MS Acute Rehab Dept. Number: [facility] [id number] and [place] [phone]

## 2015-11-12 NOTE — Progress Notes (Signed)
Patient Name: TEYANNA GARCIAGONZALEZ      SUBJECTIVE: tied and weak  Breathing short no chest pain  Past Medical History  Diagnosis Date  . Hypertension   . Peripheral neuropathy ([place])   . High cholesterol   . Persistent atrial fibrillation ([place])   . History of radiation therapy   . Partial bowel obstruction (HCC) JAN/2013    recurrent  . Colon polyp 2011  . Osteopenia 02/2013    T score -1.4 FRAX 11%/2.1%  . Stroke [facility])     "small stroke & several TIA's occasional visual problems / and occasional speach problems  . Arthritis   . Personal history of skin cancer   . Sleep apnea     unaable to tolerate mask   . Endometrial cancer ([place]) 2006    Stage IIIc  s/p radiation  . Breast cancer ([place]) 1999    LOBULAR CARCINOMA IN SITU...  . Small bowel obstruction due to adhesions ([place]) 12/28/2013  . Internal hemorrhoids   . Recurrent Clostridium difficile diarrhea   . Mobitz II     a. s/p MDT PPM     Scheduled Meds:  Scheduled Meds: . amiodarone  400 mg Oral BID  . ezetimibe  5 mg Oral Daily  . Influenza vac split quadrivalent PF  0.5 mL Intramuscular Tomorrow-1000  . metoprolol succinate  25 mg Oral BID  . pregabalin  75 mg Oral BID  . sodium chloride flush  3 mL Intravenous Q12H  . Warfarin - Pharmacist Dosing Inpatient   Does not apply q1800   Continuous Infusions:  sodium chloride, acetaminophen, ondansetron (ZOFRAN) IV, sodium chloride flush    PHYSICAL EXAM Filed Vitals:   11/11/15 1211 11/11/15 2204 11/11/15 2208 11/12/15 0409  BP: 112/59 147/51    Pulse: 60 65    Temp: 97.9 F (36.6 C) 98 F (36.7 C)    TempSrc: Oral Oral    Resp: 18 18    Height:      Weight:    161 lb 9.6 oz (73.301 kg)  SpO2: 99% 100% 100%     Well developed and nourished in no acute distress HENT normal Neck supple with JVP 9 Carotids brisk and full without bruits Decreased R>L  Regular rate and 99991111 systolic Abd-soft with active BS without hepatomegaly No Clubbing  cyanosis edema Skin-warm and dry A & Oriented  Grossly normal sensory and motor function   TELEMETRY: Reviewed telemetry pt in afib and v pacing    Intake/Output Summary (Last 24 hours) at 11/12/15 0736 Last data filed at 11/12/15 0717  Gross per 24 hour  Intake    840 ml  Output    550 ml  Net    290 ml    LABS: Basic Metabolic Panel:  Recent Labs Lab 11/08/15 1356 11/08/15 2015 11/09/15 0225 11/10/15 0410 11/11/15 0308 11/12/15 0544  NA 143  --  144 139 135 139  K 3.2*  --  3.7 4.1 4.1 4.4  CL 104  --  105 101 100* 102  CO2 26  --  28 28 24 28   GLUCOSE 110*  --  86 99 99 91  BUN 26*  --  27* 38* 43* 47*  CREATININE 1.85*  --  1.88* 2.60* 2.79* 2.34*  CALCIUM 9.2  --  8.9 8.7* 8.4* 8.8*  MG  --  1.7  --   --   --   --    Cardiac Enzymes:  Recent Labs  11/09/15 0938  TROPONINI 0.26*   CBC:  Recent Labs Lab 11/08/15 1356 11/09/15 0225 11/10/15 0410 11/11/15 0308 11/12/15 0544  WBC 7.7 5.9 5.7 7.1 6.0  NEUTROABS 6.0  --   --   --   --   HGB 11.2* 9.7* 9.1* 9.5* 9.6*  HCT 35.1* 30.6* 29.3* 30.8* 29.4*  MCV 93.9 93.6 93.3 93.3 92.7  PLT 236 178 173 197 172   PROTIME:  Recent Labs  11/10/15 0410 11/11/15 0308 11/12/15 0544  LABPROT 26.3* 24.9* 25.3*  INR 2.45* 2.28* 2.33*   Liver Function Tests: No results for input(s): AST, ALT, ALKPHOS, BILITOT, PROT, ALBUMIN in the last 72 hours. No results for input(s): LIPASE, AMYLASE in the last 72 hours. BNP: BNP (last 3 results)  Recent Labs  11/08/15 1356  BNP 707.6*    ProBNP (last 3 results) No results for input(s): PROBNP in the last 8760 hours.    ASSESSMENT AND PLAN:  Principal Problem:   Acute on chronic diastolic HF (heart failure) (HCC) Active Problems:   Pacemaker- Dual-chamber-mdt   Elevated troponin   Acute on chronic diastolic congestive heart failure (HCC)  Improved renal function Amiodarone initiated Stress test pending today for new cardiomyopathy Will diurese to day  IV lasix  Anticipate DCCV in a few weeks as INR low on admission  Have to watch for amio/ coumadin interaction [pharmacy on board  Signed, Virl Axe MD  11/12/2015

## 2015-11-12 NOTE — Progress Notes (Signed)
lexiscan myoview completed without complications.  nuc results to follow. 

## 2015-11-12 NOTE — Evaluation (Signed)
Occupational Therapy Evaluation Patient Details Name: Yolanda Spears MRN: [id number] DOB: 03/08/1935 Today's Date: 11/12/2015    History of Present Illness 81 yo female admitted acute on chronic diastolic HF, elevated troponin with chest pain PMH: HTN, periopheral neuropathy, cva, breast CA,    Clinical Impression   Pt noted to have DOE with transfer to 3n1 and sponge bath. Pt required 1L oxygen after completing task. Pt will have sister and care angel services upon d/c home. OT to follow acutely for energy conservation education next session. Recommend HHOT with 3n1.     Follow Up Recommendations  Home health OT;Supervision - Intermittent    Equipment Recommendations  3 in 1 bedside comode    Recommendations for Other Services       Precautions / Restrictions Precautions Precautions: Fall      Mobility Bed Mobility Overal bed mobility: Needs Assistance Bed Mobility: Supine to Sit;Sit to Supine     Supine to sit: Min guard Sit to supine: Min guard;HOB elevated   General bed mobility comments: pt required use of HOB elevated and bed rail  Transfers Overall transfer level: Needs assistance   Transfers: Sit to/from Stand Sit to Stand: Min assist         General transfer comment: requires environmental supports     Balance                                            ADL Overall ADL's : Needs assistance/impaired Eating/Feeding: NPO   Grooming: Wash/dry face;Oral care;Supervision/safety;Sitting Grooming Details (indicate cue type and reason): required supine due to SOB after toileting and bathing Upper Body Bathing: Set up;Sitting   Lower Body Bathing: Minimal assistance;Sit to/from stand Lower Body Bathing Details (indicate cue type and reason): static standing UE on bed and trunk support by therapist Upper Body Dressing : Supervision/safety;Sitting   Lower Body Dressing: Moderate assistance;Sit to/from stand Lower Body Dressing Details  (indicate cue type and reason): pull underwear from knees to correct position Toilet Transfer: Minimal assistance;Stand-pivot;BSC   Toileting- Clothing Manipulation and Hygiene: Min guard;Sit to/from stand         General ADL Comments: Pt on 3n1 on arrival. Pt completed bathing in seated position. Pt on RA and placing 1L oxygen on due to DOE. pt requesting return to supine due to DOE. Pt very pleasant and eager to participate. pt reports x2 weeks of progressive weakness.     Vision     Perception     Praxis      Pertinent Vitals/Pain Pain Assessment: No/denies pain     Hand Dominance Right   Extremity/Trunk Assessment Upper Extremity Assessment Upper Extremity Assessment: Overall WFL for tasks assessed   Lower Extremity Assessment Lower Extremity Assessment: Defer to PT evaluation   Cervical / Trunk Assessment Cervical / Trunk Assessment: Normal   Communication Communication Communication: No difficulties (dyspnea)   Cognition Arousal/Alertness: Awake/alert Behavior During Therapy: WFL for tasks assessed/performed Overall Cognitive Status: Within Functional Limits for tasks assessed                     General Comments       Exercises       Shoulder Instructions      Home Living Family/patient expects to be discharged to:: Private residence Living Arrangements: Alone Available Help at Discharge: Family;Available PRN/intermittently Type of Home: Apartment Home Access: Level  entry     Home Layout: One level     Bathroom Shower/Tub: Walk-in shower;Door   [employer] Toilet: Standard Bathroom Accessibility: Yes   Home Equipment: Environmental consultant - 4 wheels;Cane - single point;Shower seat;Grab bars - toilet;Grab bars - tub/shower;Hand held shower head;Adaptive equipment   Additional Comments: Pt does not drive; reports sister provides transportation; children are not local; minimal cooking - reports family looking into premade meal options and meal delivery .  Has arranged for "care angels " to assist at home for 2 hours but can increase to more if needed      Prior Functioning/Environment Level of Independence: Independent with assistive device(s)        Comments: patient states that in the house she walls and furniture surfs and in the community she uses cane vs rollator vs w/c depending on how she feels    OT Diagnosis: Generalized weakness   OT Problem List: Decreased strength;Decreased activity tolerance;Impaired balance (sitting and/or standing);Decreased knowledge of use of DME or AE;Decreased knowledge of precautions;Cardiopulmonary status limiting activity   OT Treatment/Interventions: Self-care/ADL training;Therapeutic exercise;Energy conservation;DME and/or AE instruction;Therapeutic activities;Patient/family education;Balance training    OT Goals(Current goals can be found in the care plan section) Acute Rehab OT Goals Patient Stated Goal: to get stronger OT Goal Formulation: With patient Time For Goal Achievement: 11/26/15 Potential to Achieve Goals: Good  OT Frequency: Min 2X/week   Barriers to D/C: Decreased caregiver support  has sister and care angels that can assist but will be home at times alone       Co-evaluation              End of Session Equipment Utilized During Treatment: Gait belt;Oxygen Nurse Communication: Mobility status;Precautions  Activity Tolerance: Patient tolerated treatment well Patient left: Other (comment) (transport to Stress test present)   Time: [id number] OT Time Calculation (min): 18 min Charges:  OT General Charges $OT Visit: 1 Procedure OT Evaluation $OT Eval Moderate Complexity: 1 Procedure G-Codes:    Parke Poisson B 11/21/15, 8:01 AM   Jeri Modena   OTR/L Pager: [phone] Office: [phone] .

## 2015-11-13 ENCOUNTER — Ambulatory Visit: Payer: Medicare Other | Admitting: Physician Assistant

## 2015-11-13 DIAGNOSIS — N184 Chronic kidney disease, stage 4 (severe): Secondary | ICD-10-CM

## 2015-11-13 DIAGNOSIS — E0590 Thyrotoxicosis, unspecified without thyrotoxic crisis or storm: Secondary | ICD-10-CM

## 2015-11-13 DIAGNOSIS — I429 Cardiomyopathy, unspecified: Secondary | ICD-10-CM

## 2015-11-13 LAB — BASIC METABOLIC PANEL
Anion gap: 8 (ref 5–15)
BUN: 48 mg/dL — ABNORMAL HIGH (ref 6–20)
CALCIUM: 8.4 mg/dL — AB (ref 8.9–10.3)
CO2: 27 mmol/L (ref 22–32)
Chloride: 104 mmol/L (ref 101–111)
Creatinine, Ser: 2.29 mg/dL — ABNORMAL HIGH (ref 0.44–1.00)
GFR, EST AFRICAN AMERICAN: 22 mL/min — AB (ref >60)
GFR, EST NON AFRICAN AMERICAN: 19 mL/min — AB (ref >60)
Glucose, Bld: 98 mg/dL (ref 65–99)
POTASSIUM: 4 mmol/L (ref 3.5–5.1)
SODIUM: 139 mmol/L (ref 135–145)

## 2015-11-13 LAB — CBC
HEMATOCRIT: 28.8 % — AB (ref 36.0–46.0)
HEMOGLOBIN: 9.2 g/dL — AB (ref 12.0–15.0)
MCH: 30.2 pg (ref 26.0–34.0)
MCHC: 31.9 g/dL (ref 30.0–36.0)
MCV: 94.4 fL (ref 78.0–100.0)
Platelets: 169 10*3/uL (ref 150–400)
RBC: 3.05 MIL/uL — AB (ref 3.87–5.11)
RDW: 14.3 % (ref 11.5–15.5)
WBC: 7.1 10*3/uL (ref 4.0–10.5)

## 2015-11-13 LAB — PROTIME-INR
INR: 2.7 — AB (ref 0.00–1.49)
Prothrombin Time: 28.3 seconds — ABNORMAL HIGH (ref 11.6–15.2)

## 2015-11-13 LAB — T3, FREE: T3 FREE: 2.6 pg/mL (ref 2.0–4.4)

## 2015-11-13 MED ORDER — WARFARIN SODIUM 2 MG PO TABS
1.0000 mg | ORAL_TABLET | Freq: Once | ORAL | Status: AC
Start: 2015-11-13 — End: 2015-11-14
  Administered 2015-11-14: 1 mg via ORAL
  Filled 2015-11-13 (×3): qty 0.5

## 2015-11-13 MED ORDER — FUROSEMIDE 10 MG/ML IJ SOLN
80.0000 mg | Freq: Two times a day (BID) | INTRAMUSCULAR | Status: AC
  Administered 2015-11-13 (×2): 80 mg via INTRAVENOUS
  Filled 2015-11-13 (×2): qty 8

## 2015-11-13 MED ORDER — ISOSORBIDE MONONITRATE ER 30 MG PO TB24
30.0000 mg | ORAL_TABLET | Freq: Every day | ORAL | Status: DC
  Administered 2015-11-13 – 2015-11-15 (×3): 30 mg via ORAL
  Filled 2015-11-13 (×3): qty 1

## 2015-11-13 NOTE — Progress Notes (Signed)
Talked to patient again with her son at bedside, he requested additional information on personal care services in the area. Information given as requested. Mindi Slicker [facility] [phone]

## 2015-11-13 NOTE — Progress Notes (Signed)
ANTICOAGULATION CONSULT NOTE - Follow Up Consult  Pharmacy Consult for warfarin Indication: atrial fibrillation  Allergies  Allergen Reactions  . Statins Other (See Comments)    Myalgias,elevated LFT studies  . Shellfish-Derived Products Nausea And Vomiting  . Iohexol Hives and Itching    Itching in eyes, one hive on face.  . Other Hives    Contrast dye   . Penicillins Itching    Patient Measurements: Height: 5\' 6"  (167.6 cm) Weight: 153 lb 3.5 oz (69.5 kg) IBW/kg (Calculated) : 59.3  Vital Signs: Temp: 97.8 F (36.6 C) (03/04 0530) Temp Source: Oral (03/04 0530) BP: 101/43 mmHg (03/04 1056) Pulse Rate: 61 (03/04 0530)   Estimated Creatinine Clearance: 23.1 mL/min (by C-G formula based on Cr of 1.88).   Assessment: 81 yo F admitted 11/08/2015 with chest pain. Patient is on coumadin for Afib. INR 2.7 (therapeutic). Noted started amiodarone on 3/6. Hgb 9.2, Plt wnl. No s/sx of bleeding noted.   Warfarin PTA dose: 2.5 mg on Wed, 1.25 mg all other days  (Last dose PTA 3/2).    Goal of Therapy:  INR 2-3 Monitor platelets by anticoagulation protocol: Yes   Plan:   - Warfarin 1 mg x1 tonight - Monitor daily INR, HL, CBC and s/sx of bleeding  Maryanna Shape, PharmD, BCPS  Clinical Pharmacist  Pager: [phone]   11/13/2015, 11:02 AM

## 2015-11-13 NOTE — Evaluation (Signed)
Physical Therapy Evaluation Patient Details Name: Yolanda Spears MRN: [id number] DOB: May 02, 1935 Today's Date: 11/13/2015   History of Present Illness  81 yo female admitted acute on chronic diastolic HF, elevated troponin with chest pain PMH: HTN, periopheral neuropathy, cva, breast CA,   Clinical Impression  Pt admitted with above. Pt was mod I with ADLs and ambulation with RW with visiting angels assisting with IADLs. Pt with decreased endurance, SOB with activity, and generalized weakness. Pt to benefit from HHPT to address mentioned deficits. Pt to required 24/7 assist initially until she can be mod I with ADLs and ambulation.    Follow Up Recommendations Home health PT;Supervision/Assistance - 24 hour    Equipment Recommendations  None recommended by PT    Recommendations for Other Services       Precautions / Restrictions Precautions Precautions: Fall Restrictions Weight Bearing Restrictions: No      Mobility  Bed Mobility Overal bed mobility: Needs Assistance Bed Mobility: Supine to Sit     Supine to sit: Min assist     General bed mobility comments: increased time, labored effort, used of bed rail and HOB elevated  Transfers Overall transfer level: Needs assistance Equipment used: Rolling walker (2 wheeled) Transfers: Sit to/from Stand Sit to Stand: Min assist         General transfer comment: pt required strong pull on grab bars in bathroom to get up from toliet, v/c's to push up with UEs from bed, increased time, mild SOB  Ambulation/Gait Ambulation/Gait assistance: Min assist Ambulation Distance (Feet): 10 Feet (x2) Assistive device: Rolling walker (2 wheeled) Gait Pattern/deviations: Step-through pattern;Decreased stride length Gait velocity: slow Gait velocity interpretation: Below normal speed for age/gender General Gait Details: +SOB, SpO2 at 93% on 1 LO2, pt deferred  amb in hallway due to fatigue. limited to amb to/from bathroom, v/c's for  walker management  Stairs            Wheelchair Mobility    Modified Rankin (Stroke Patients Only)       Balance Overall balance assessment: Needs assistance Sitting-balance support: Feet supported;No upper extremity supported Sitting balance-Leahy Scale: Good     Standing balance support: No upper extremity supported;During functional activity Standing balance-Leahy Scale: Fair Standing balance comment: pt stood 30 sec to wash hands but then had to brace self with UEs due to fatigue                             Pertinent Vitals/Pain Pain Assessment: No/denies pain    Home Living Family/patient expects to be discharged to:: Private residence Living Arrangements: Alone Available Help at Discharge: Family;Friend(s);Personal care attendant;Available 24 hours/day Type of Home: Apartment Home Access: Level entry     Home Layout: One level Home Equipment: Walker - 4 wheels;Cane - single point;Shower seat;Grab bars - toilet;Grab bars - tub/shower;Hand held shower head;Adaptive equipment Additional Comments: Pt does not drive; reports sister provides transportation; children are not local; minimal cooking - reports family looking into premade meal options and meal delivery . Has arranged for "care angels " to assist at home for 2 hours but can increase to more if needed    Prior Function Level of Independence: Independent with assistive device(s)         Comments: patient states that in the house she walls and furniture surfs and in the community she uses cane vs rollator vs w/c depending on how she feels     Hand  Dominance   Dominant Hand: Right    Extremity/Trunk Assessment   Upper Extremity Assessment: Generalized weakness           Lower Extremity Assessment: Generalized weakness      Cervical / Trunk Assessment: Normal  Communication   Communication: No difficulties  Cognition Arousal/Alertness: Awake/alert Behavior During Therapy: WFL  for tasks assessed/performed Overall Cognitive Status: Within Functional Limits for tasks assessed                      General Comments General comments (skin integrity, edema, etc.): mild LE edema, pt assisted to bathroom, pt modified indep for hygiene s/p BM    Exercises        Assessment/Plan    PT Assessment Patient needs continued PT services  PT Diagnosis Difficulty walking;Generalized weakness   PT Problem List Decreased strength;Decreased activity tolerance;Decreased balance;Decreased mobility;Decreased coordination  PT Treatment Interventions DME instruction;Gait training;Functional mobility training;Therapeutic activities;Therapeutic exercise;Balance training   PT Goals (Current goals can be found in the Care Plan section) Acute Rehab PT Goals Patient Stated Goal: to get my breathing undercontrol PT Goal Formulation: With patient/family Time For Goal Achievement: 11/27/15 Potential to Achieve Goals: Good    Frequency Min 3X/week   Barriers to discharge        Co-evaluation               End of Session Equipment Utilized During Treatment: Gait belt Activity Tolerance: Patient limited by fatigue Patient left: in chair;with call bell/phone within reach;with family/visitor present Nurse Communication: Mobility status         Time: 1001-1037 PT Time Calculation (min) (ACUTE ONLY): 36 min   Charges:   PT Evaluation $PT Eval Moderate Complexity: 1 Procedure PT Treatments $Gait Training: 8-22 mins   PT G CodesKingsley Callander 11/13/2015, 11:20 AM   Kittie Plater, PT, DPT Pager #: [phone] Office #: [phone]

## 2015-11-13 NOTE — Progress Notes (Signed)
Patient Name: Yolanda Spears      SUBJECTIVE: still  tired and weak  Breathing better IO not correct with use of pads  Past Medical History  Diagnosis Date  . Hypertension   . Peripheral neuropathy ([place])   . High cholesterol   . Persistent atrial fibrillation ([place])   . History of radiation therapy   . Partial bowel obstruction (HCC) JAN/2013    recurrent  . Colon polyp 2011  . Osteopenia 02/2013    T score -1.4 FRAX 11%/2.1%  . Stroke [facility])     "small stroke & several TIA's occasional visual problems / and occasional speach problems  . Arthritis   . Personal history of skin cancer   . Sleep apnea     unaable to tolerate mask   . Endometrial cancer ([place]) 2006    Stage IIIc  s/p radiation  . Breast cancer ([place]) 1999    LOBULAR CARCINOMA IN SITU...  . Small bowel obstruction due to adhesions ([place]) 12/28/2013  . Internal hemorrhoids   . Recurrent Clostridium difficile diarrhea   . Mobitz II     a. s/p MDT PPM     Scheduled Meds:  Scheduled Meds: . amiodarone  400 mg Oral BID  . ezetimibe  5 mg Oral Daily  . Influenza vac split quadrivalent PF  0.5 mL Intramuscular Tomorrow-1000  . metoprolol succinate  25 mg Oral BID  . pregabalin  75 mg Oral BID  . sodium chloride flush  3 mL Intravenous Q12H  . Warfarin - Pharmacist Dosing Inpatient   Does not apply q1800   Continuous Infusions:  sodium chloride, acetaminophen, ondansetron (ZOFRAN) IV, sodium chloride flush    PHYSICAL EXAM Filed Vitals:   11/12/15 1014 11/12/15 1643 11/12/15 2018 11/13/15 0553  BP: 159/77 140/65 113/49 133/57  Pulse: 87 60 61 60  Temp:  97.9 F (36.6 C) 97.7 F (36.5 C) 97.8 F (36.6 C)  TempSrc:  Oral Oral Oral  Resp:  18 16 16   Height:      Weight:    159 lb 1.6 oz (72.167 kg)  SpO2:  98% 99% 100%     Well developed and nourished in no acute distress HENT normal Neck supple with JVP 9 Carotids brisk and full without bruits Decreased R>L  Regular rate and 99991111  systolic Abd-soft with active BS without hepatomegaly No Clubbing cyanosis edema Skin-warm and dry A & Oriented  Grossly normal sensory and motor function   TELEMETRY:   pt in afib and v pacing    Intake/Output Summary (Last 24 hours) at 11/13/15 0639 Last data filed at 11/13/15 0400  Gross per 24 hour  Intake    465 ml  Output    430 ml  Net     35 ml    LABS: Basic Metabolic Panel:  Recent Labs Lab 11/08/15 1356 11/08/15 2015 11/09/15 0225 11/10/15 0410 11/11/15 0308 11/12/15 0544 11/13/15 0520  NA 143  --  144 139 135 139 139  K 3.2*  --  3.7 4.1 4.1 4.4 4.0  CL 104  --  105 101 100* 102 104  CO2 26  --  28 28 24 28 27   GLUCOSE 110*  --  86 99 99 91 98  BUN 26*  --  27* 38* 43* 47* 48*  CREATININE 1.85*  --  1.88* 2.60* 2.79* 2.34* 2.29*  CALCIUM 9.2  --  8.9 8.7* 8.4* 8.8* 8.4*  MG  --  1.7  --   --   --   --   --    CBC:  Recent Labs Lab 11/08/15 1356 11/09/15 0225 11/10/15 0410 11/11/15 0308 11/12/15 0544  WBC 7.7 5.9 5.7 7.1 6.0  NEUTROABS 6.0  --   --   --   --   HGB 11.2* 9.7* 9.1* 9.5* 9.6*  HCT 35.1* 30.6* 29.3* 30.8* 29.4*  MCV 93.9 93.6 93.3 93.3 92.7  PLT 236 178 173 197 172   PROTIME:  Recent Labs  11/11/15 0308 11/12/15 0544 11/13/15 0520  LABPROT 24.9* 25.3* 28.3*  INR 2.28* 2.33* 2.70*   BNP: BNP (last 3 results)  Recent Labs  11/08/15 1356  BNP 707.6*       ASSESSMENT AND PLAN:  Principal Problem:   Acute on chronic combined systolic and diastolic HF (heart failure) (HCC) Active Problems:   Atrial fibrillation (HCC)   Pacemaker- Dual-chamber-mdt   Elevated troponin   1) CHF Acute/chronic 2) New LV dysfunction with wall motion abnormality 3) persistent Atrial Fibrillation 4) Renal Insufficiency Acute.chronic 5) Pacemaker Medtronic 6) asmmetric respirtatons 7) Hyperthryoidism 8) Debility      IV lasix yesterday -- weight down 2 lbs, though remains fluid + Continue iv diuresis  Increase to 80  New  CM Stress test with large fixed defect (apex, septum, IW), no ischemia, EF 31% Reviewed imabges withDM  His feeling is much of it may be artifact, but in the context of her renal dysfunction further clarification with cath is not appropriate Will treat empirically with nitrates first and then add hydralazine  Incentive spirometry    Amiodarone initiated Anticipate DCCV in a few weeks as INR low on admission  Have to watch for amio/ coumadin interaction (pharmacy on board) INR tody 2.70  Hyperthryoidism Have reaced out to Dr Dorothea Ogle for advice as to treatment  ? Will amio make it worse  apparaently has had low grade problem in past treated by Dr Wilson Singer  Ambulate OOB HELP    Signed,   11/13/2015

## 2015-11-14 DIAGNOSIS — E0590 Thyrotoxicosis, unspecified without thyrotoxic crisis or storm: Secondary | ICD-10-CM

## 2015-11-14 DIAGNOSIS — N184 Chronic kidney disease, stage 4 (severe): Secondary | ICD-10-CM

## 2015-11-14 LAB — PROTIME-INR
INR: 2.46 — AB (ref 0.00–1.49)
Prothrombin Time: 26.3 seconds — ABNORMAL HIGH (ref 11.6–15.2)

## 2015-11-14 LAB — BASIC METABOLIC PANEL
Anion gap: 11 (ref 5–15)
BUN: 47 mg/dL — AB (ref 6–20)
CHLORIDE: 100 mmol/L — AB (ref 101–111)
CO2: 26 mmol/L (ref 22–32)
Calcium: 8.1 mg/dL — ABNORMAL LOW (ref 8.9–10.3)
Creatinine, Ser: 2.21 mg/dL — ABNORMAL HIGH (ref 0.44–1.00)
GFR calc Af Amer: 23 mL/min — ABNORMAL LOW (ref >60)
GFR, EST NON AFRICAN AMERICAN: 20 mL/min — AB (ref >60)
GLUCOSE: 87 mg/dL (ref 65–99)
POTASSIUM: 3.8 mmol/L (ref 3.5–5.1)
Sodium: 137 mmol/L (ref 135–145)

## 2015-11-14 MED ORDER — WARFARIN SODIUM 2.5 MG PO TABS
1.2500 mg | ORAL_TABLET | Freq: Every day | ORAL | Status: DC
  Administered 2015-11-14 – 2015-11-15 (×2): 1.25 mg via ORAL
  Filled 2015-11-14 (×2): qty 0.5

## 2015-11-14 MED ORDER — HYDRALAZINE HCL 25 MG PO TABS
25.0000 mg | ORAL_TABLET | Freq: Two times a day (BID) | ORAL | Status: DC
  Administered 2015-11-14 – 2015-11-15 (×3): 25 mg via ORAL
  Filled 2015-11-14 (×3): qty 1

## 2015-11-14 MED ORDER — WARFARIN SODIUM 2.5 MG PO TABS: 1.2500 mg | ORAL_TABLET | Freq: Once | ORAL | Status: DC

## 2015-11-14 NOTE — Progress Notes (Signed)
ANTICOAGULATION CONSULT NOTE - Follow Up Consult  Pharmacy Consult for warfarin Indication: atrial fibrillation  Allergies  Allergen Reactions  . Statins Other (See Comments)    Myalgias,elevated LFT studies  . Shellfish-Derived Products Nausea And Vomiting  . Iohexol Hives and Itching    Itching in eyes, one hive on face.  . Other Hives    Contrast dye   . Penicillins Itching    Patient Measurements: Height: 5\' 6"  (167.6 cm) Weight: 153 lb 3.5 oz (69.5 kg) IBW/kg (Calculated) : 59.3  Vital Signs: Temp: 97.8 F (36.6 C) (03/04 0530) Temp Source: Oral (03/04 0530) BP: 101/43 mmHg (03/04 1056) Pulse Rate: 61 (03/04 0530)   Estimated Creatinine Clearance: 23.1 mL/min (by C-G formula based on Cr of 1.88).   Assessment: 81 yo F admitted 11/08/2015 with chest pain. Patient is on coumadin for Afib. INR 2.46 (therapeutic). Noted started amiodarone on 3/6. No s/sx of bleeding noted.   Warfarin PTA dose: 2.5 mg on Wed, 1.25 mg all other days  (Last dose PTA 3/2).    Goal of Therapy:  INR 2-3 Monitor platelets by anticoagulation protocol: Yes   Plan:   - Warfarin 1.25 mg po daily - Monitor daily INR, HL, CBC and s/sx of bleeding  Maryanna Shape, PharmD, BCPS  Clinical Pharmacist  Pager: [phone]   11/14/2015, 11:08 AM

## 2015-11-14 NOTE — Progress Notes (Signed)
Patient Name: Yolanda Spears      SUBJECTIVE: Feeling better. Still weak. Using oxygen. Brisk diuresis.    Past Medical History  Diagnosis Date  . Hypertension   . Peripheral neuropathy ([place])   . High cholesterol   . Persistent atrial fibrillation ([place])   . History of radiation therapy   . Partial bowel obstruction (HCC) JAN/2013    recurrent  . Colon polyp 2011  . Osteopenia 02/2013    T score -1.4 FRAX 11%/2.1%  . Stroke [facility])     "small stroke & several TIA's occasional visual problems / and occasional speach problems  . Arthritis   . Personal history of skin cancer   . Sleep apnea     unaable to tolerate mask   . Endometrial cancer ([place]) 2006    Stage IIIc  s/p radiation  . Breast cancer ([place]) 1999    LOBULAR CARCINOMA IN SITU...  . Small bowel obstruction due to adhesions ([place]) 12/28/2013  . Internal hemorrhoids   . Recurrent Clostridium difficile diarrhea   . Mobitz II     a. s/p MDT PPM     Scheduled Meds:  Scheduled Meds: . amiodarone  400 mg Oral BID  . ezetimibe  5 mg Oral Daily  . Influenza vac split quadrivalent PF  0.5 mL Intramuscular Tomorrow-1000  . isosorbide mononitrate  30 mg Oral Daily  . metoprolol succinate  25 mg Oral BID  . pregabalin  75 mg Oral BID  . sodium chloride flush  3 mL Intravenous Q12H  . Warfarin - Pharmacist Dosing Inpatient   Does not apply q1800   Continuous Infusions:  sodium chloride, acetaminophen, ondansetron (ZOFRAN) IV, sodium chloride flush   Filed Weights   11/12/15 0409 11/13/15 0553 11/14/15 0414  Weight: 161 lb 9.6 oz (73.301 kg) 159 lb 1.6 oz (72.167 kg) 158 lb 4.8 oz (71.804 kg)    PHYSICAL EXAM Filed Vitals:   11/13/15 1246 11/13/15 2153 11/14/15 0414 11/14/15 0637  BP: 139/50 144/46  133/54  Pulse: 80 70  64  Temp: 97.9 F (36.6 C) 98 F (36.7 C)  98.8 F (37.1 C)  TempSrc: Oral Oral  Oral  Resp: 20 20    Height:      Weight:   158 lb 4.8 oz (71.804 kg)   SpO2: 99% 97%  96%       Well developed and nourished in no acute distress HENT normal Neck supple with JVP 7 Carotids brisk and full without bruits Decreased R>L  Regular rate and 99991111 systolic Abd-soft with active BS without hepatomegaly No Clubbing cyanosis edema Skin-warm and dry A & Oriented  Grossly normal sensory and motor function   TELEMETRY:   pt in afib and v pacing  Controlled VR    Intake/Output Summary (Last 24 hours) at 11/14/15 0747 Last data filed at 11/14/15 0239  Gross per 24 hour  Intake    898 ml  Output    300 ml  Net    598 ml    LABS: Basic Metabolic Panel:  Recent Labs Lab 11/08/15 1356 11/08/15 2015 11/09/15 0225 11/10/15 0410 11/11/15 0308 11/12/15 0544 11/13/15 0520 11/14/15 0404  NA 143  --  144 139 135 139 139 137  K 3.2*  --  3.7 4.1 4.1 4.4 4.0 3.8  CL 104  --  105 101 100* 102 104 100*  CO2 26  --  28 28 24 28 27 26   GLUCOSE 110*  --  86 99 99 91 98 87  BUN 26*  --  27* 38* 43* 47* 48* 47*  CREATININE 1.85*  --  1.88* 2.60* 2.79* 2.34* 2.29* 2.21*  CALCIUM 9.2  --  8.9 8.7* 8.4* 8.8* 8.4* 8.1*  MG  --  1.7  --   --   --   --   --   --    CBC:  Recent Labs Lab 11/08/15 1356 11/09/15 0225 11/10/15 0410 11/11/15 0308 11/12/15 0544 11/13/15 0520  WBC 7.7 5.9 5.7 7.1 6.0 7.1  NEUTROABS 6.0  --   --   --   --   --   HGB 11.2* 9.7* 9.1* 9.5* 9.6* 9.2*  HCT 35.1* 30.6* 29.3* 30.8* 29.4* 28.8*  MCV 93.9 93.6 93.3 93.3 92.7 94.4  PLT 236 178 173 197 172 169   PROTIME:  Recent Labs  11/12/15 0544 11/13/15 0520 11/14/15 0404  LABPROT 25.3* 28.3* 26.3*  INR 2.33* 2.70* 2.46*   BNP: BNP (last 3 results)  Recent Labs  11/08/15 1356  BNP 707.6*       ASSESSMENT AND PLAN:  Principal Problem:   Acute on chronic combined systolic and diastolic HF (heart failure) (HCC) Active Problems:   Atrial fibrillation (HCC)   Pacemaker- Dual-chamber-mdt   Elevated troponin   Acute renal failure superimposed on stage 4 chronic kidney  disease (HCC)   Hyperthyroidism   Cardiomyopathy ([place])   1) CHF Acute/chronic 2) New LV dysfunction with wall motion abnormality 3) persistent Atrial Fibrillation 4) Renal Insufficiency Acute.chronic improving 5) Pacemaker Medtronic 6) asmmetric respirations 7) Hyperthryoidism 8) Debility      IV lasix   Continue  80 until Cr starts to bump-- appears verapamil may have been contributing to renal injury   New CM Stress test with large fixed defect (apex, septum, IW), no ischemia, EF 31% Reviewed imabges withDM  His feeling is much of it may be artifact, but in the context of her renal dysfunction further clarification with cath is not appropriate Treat empirically with nitrates first and will add back hydralazine  Incentive spirometry and add flutter valve  Amiodarone initiated for afib   Anticipate DCCV in a few weeks as INR low on admission    Hyperthryoidism -- Have reaced out to Dr Dorothea Ogle for advice as to treatment   ? amio make it worse    W normal T3 thoughts were follow as opposed to steroids./methimazole  Ambulate OOB HELP   Home 48 hrs  HH recs appreciated    Reviewed situation with son for 20 minutes.   11/14/2015

## 2015-11-15 LAB — BASIC METABOLIC PANEL
Anion gap: 12 (ref 5–15)
BUN: 42 mg/dL — AB (ref 6–20)
CALCIUM: 8.6 mg/dL — AB (ref 8.9–10.3)
CHLORIDE: 101 mmol/L (ref 101–111)
CO2: 28 mmol/L (ref 22–32)
CREATININE: 2.06 mg/dL — AB (ref 0.44–1.00)
GFR calc Af Amer: 25 mL/min — ABNORMAL LOW (ref >60)
GFR calc non Af Amer: 22 mL/min — ABNORMAL LOW (ref >60)
GLUCOSE: 87 mg/dL (ref 65–99)
Potassium: 4 mmol/L (ref 3.5–5.1)
Sodium: 141 mmol/L (ref 135–145)

## 2015-11-15 LAB — PROTIME-INR
INR: 2.49 — ABNORMAL HIGH (ref 0.00–1.49)
PROTHROMBIN TIME: 26.6 s — AB (ref 11.6–15.2)

## 2015-11-15 MED ORDER — ISOSORBIDE MONONITRATE ER 60 MG PO TB24
60.0000 mg | ORAL_TABLET | Freq: Every day | ORAL | Status: DC
Start: 2015-11-16 — End: 2015-11-16
  Administered 2015-11-16: 60 mg via ORAL
  Filled 2015-11-15: qty 1

## 2015-11-15 MED ORDER — HYDRALAZINE HCL 50 MG PO TABS
50.0000 mg | ORAL_TABLET | Freq: Two times a day (BID) | ORAL | Status: DC
Start: 2015-11-15 — End: 2015-11-16
  Administered 2015-11-15 – 2015-11-16 (×2): 50 mg via ORAL
  Filled 2015-11-15 (×2): qty 1

## 2015-11-15 MED ORDER — FUROSEMIDE 10 MG/ML IJ SOLN
80.0000 mg | Freq: Two times a day (BID) | INTRAMUSCULAR | Status: DC
  Administered 2015-11-15 – 2015-11-16 (×3): 80 mg via INTRAVENOUS
  Filled 2015-11-15 (×3): qty 8

## 2015-11-15 MED ORDER — FUROSEMIDE 10 MG/ML IJ SOLN: 40.0000 mg | Freq: Two times a day (BID) | INTRAMUSCULAR | Status: DC

## 2015-11-15 NOTE — Progress Notes (Signed)
Patient Name: Yolanda Spears      SUBJECTIVE: Feeling better. No sob  Using oxygen less Not ambulating very far   Past Medical History  Diagnosis Date  . Hypertension   . Peripheral neuropathy ([place])   . High cholesterol   . Persistent atrial fibrillation ([place])   . History of radiation therapy   . Partial bowel obstruction (HCC) JAN/2013    recurrent  . Colon polyp 2011  . Osteopenia 02/2013    T score -1.4 FRAX 11%/2.1%  . Stroke [facility])     "small stroke & several TIA's occasional visual problems / and occasional speach problems  . Arthritis   . Personal history of skin cancer   . Sleep apnea     unaable to tolerate mask   . Endometrial cancer ([place]) 2006    Stage IIIc  s/p radiation  . Breast cancer ([place]) 1999    LOBULAR CARCINOMA IN SITU...  . Small bowel obstruction due to adhesions ([place]) 12/28/2013  . Internal hemorrhoids   . Recurrent Clostridium difficile diarrhea   . Mobitz II     a. s/p MDT PPM     Scheduled Meds:  Scheduled Meds: . amiodarone  400 mg Oral BID  . ezetimibe  5 mg Oral Daily  . furosemide  80 mg Intravenous BID  . hydrALAZINE  25 mg Oral BID  . isosorbide mononitrate  30 mg Oral Daily  . metoprolol succinate  25 mg Oral BID  . pregabalin  75 mg Oral BID  . sodium chloride flush  3 mL Intravenous Q12H  . warfarin  1.25 mg Oral q1800  . Warfarin - Pharmacist Dosing Inpatient   Does not apply q1800   Continuous Infusions:  sodium chloride, acetaminophen, ondansetron (ZOFRAN) IV, sodium chloride flush   Filed Weights   11/13/15 0553 11/14/15 0414 11/15/15 0613  Weight: 159 lb 1.6 oz (72.167 kg) 158 lb 4.8 oz (71.804 kg) 157 lb 8 oz (71.442 kg)    PHYSICAL EXAM Filed Vitals:   11/14/15 1204 11/14/15 2200 11/15/15 0613 11/15/15 1046  BP: 124/42 169/67 143/49   Pulse: 68 63 69   Temp: 98.2 F (36.8 C)  98.7 F (37.1 C)   TempSrc: Oral Oral Oral   Resp: 18 18 18    Height:      Weight:   157 lb 8 oz (71.442 kg)   SpO2:  97% 96% 100% 95%     Well developed and nourished in no acute distress HENT normal Neck supple with JVP 7 Carotids brisk and full without bruits Decreased R>L  Better  Regular rate and [id number] systolic Abd-soft with active BS without hepatomegaly No Clubbing cyanosis edema Skin-warm and dry A & Oriented  Grossly normal sensory and motor function   TELEMETRY:   pt in afib and v pacing  Controlled VR    Intake/Output Summary (Last 24 hours) at 11/15/15 1047 Last data filed at 11/15/15 0925  Gross per 24 hour  Intake    480 ml  Output    326 ml  Net    154 ml    LABS: Basic Metabolic Panel:  Recent Labs Lab 11/08/15 2015 11/09/15 0225 11/10/15 0410 11/11/15 0308 11/12/15 0544 11/13/15 0520 11/14/15 0404 11/15/15 0721  NA  --  144 139 135 139 139 137 141  K  --  3.7 4.1 4.1 4.4 4.0 3.8 4.0  CL  --  105 101 100* 102 104 100* 101  CO2  --  28 28 24 28 27 26 28   GLUCOSE  --  86 99 99 91 98 87 87  BUN  --  27* 38* 43* 47* 48* 47* 42*  CREATININE  --  1.88* 2.60* 2.79* 2.34* 2.29* 2.21* 2.06*  CALCIUM  --  8.9 8.7* 8.4* 8.8* 8.4* 8.1* 8.6*  MG 1.7  --   --   --   --   --   --   --    CBC:  Recent Labs Lab 11/08/15 1356 11/09/15 0225 11/10/15 0410 11/11/15 0308 11/12/15 0544 11/13/15 0520  WBC 7.7 5.9 5.7 7.1 6.0 7.1  NEUTROABS 6.0  --   --   --   --   --   HGB 11.2* 9.7* 9.1* 9.5* 9.6* 9.2*  HCT 35.1* 30.6* 29.3* 30.8* 29.4* 28.8*  MCV 93.9 93.6 93.3 93.3 92.7 94.4  PLT 236 178 173 197 172 169   PROTIME:  Recent Labs  11/13/15 0520 11/14/15 0404 11/15/15 0420  LABPROT 28.3* 26.3* 26.6*  INR 2.70* 2.46* 2.49*   BNP: BNP (last 3 results)  Recent Labs  11/08/15 1356  BNP 707.6*       ASSESSMENT AND PLAN:  Principal Problem:   Acute on chronic combined systolic and diastolic HF (heart failure) (HCC) Active Problems:   Atrial fibrillation (HCC)   Pacemaker- Dual-chamber-mdt   Elevated troponin   Acute renal failure superimposed on  stage 4 chronic kidney disease (HCC)   Hyperthyroidism   Cardiomyopathy ([place])   1) CHF Acute/chronic 2) New LV dysfunction with wall motion abnormality 3) persistent Atrial Fibrillation 4) Renal Insufficiency Acute.chronic improving 5) Pacemaker Medtronic 6) asmmetric respirations 7) Hyperthryoidism 8) Debility    Renal function contnues to improve slowly   IV lasix continue today     Failed to write order yesterdya :(((  Still weight down a touch  and brisk diruesis   Continue nitrates and hydralazine   Will increase given hypertension  Incentive spirometry and add flutter valve  Amiodarone initiated for afib  Continue 400 bid x 1week Continue 200 bid x 2 weeks  Anticipate DCCV in a few weeks as INR low on admission    Hyperthryoidism -- Have reaced out to Dr Dorothea Ogle for advice as to treatment   ? amio make it worse    w/ normal T3 thoughts were follow as opposed to steroids./methimazole  Ambulate OOB spoke w nursing    Home prob in am     11/15/2015

## 2015-11-15 NOTE — Progress Notes (Signed)
PT Cancellation Note  Patient Details Name: Yolanda Spears MRN: [id number] DOB: April 06, 1935   Cancelled Treatment:    Reason Eval/Treat Not Completed: Patient at procedure or test/unavailable  Pt working with OT. Will follow up as time allows.  Marguarite Arbour A Yassin Scales 11/15/2015, 11:33 AM Wray Kearns, PT, DPT [phone]

## 2015-11-15 NOTE — Progress Notes (Signed)
ANTICOAGULATION CONSULT NOTE - Follow Up Consult  Pharmacy Consult for warfarin Indication: atrial fibrillation  Allergies  Allergen Reactions  . Statins Other (See Comments)    Myalgias,elevated LFT studies  . Shellfish-Derived Products Nausea And Vomiting  . Iohexol Hives and Itching    Itching in eyes, one hive on face.  . Other Hives    Contrast dye   . Penicillins Itching    Patient Measurements: Height: 5\' 6"  (167.6 cm) Weight: 153 lb 3.5 oz (69.5 kg) IBW/kg (Calculated) : 59.3  Vital Signs: Temp: 97.8 F (36.6 C) (03/04 0530) Temp Source: Oral (03/04 0530) BP: 101/43 mmHg (03/04 1056) Pulse Rate: 61 (03/04 0530)   Estimated Creatinine Clearance: 23.1 mL/min (by C-G formula based on Cr of 1.88).  Assessment: 81 yo F admitted 11/08/2015 with chest pain. Patient is on coumadin for Afib. INR 2.49 (therapeutic). Noted started amiodarone on 3/6. No s/sx of bleeding noted.   Warfarin PTA dose: 2.5 mg on Wed, 1.25 mg all other days  (Last dose PTA 3/2).    Goal of Therapy:  INR 2-3 Monitor platelets by anticoagulation protocol: Yes   Plan:   - Warfarin 1.25 mg po daily - Monitor daily INR, HL, CBC and s/sx of bleeding  Maryanna Shape, PharmD, BCPS  Clinical Pharmacist  Pager: [phone]  11/15/2015, 11:08 AM

## 2015-11-15 NOTE — Progress Notes (Signed)
Occupational Therapy Treatment Patient Details Name: Yolanda Spears MRN: [id number] DOB: 1935/07/24 Today's Date: 11/15/2015    History of present illness 81 yo female admitted acute on chronic diastolic HF, elevated troponin with chest pain PMH: HTN, periopheral neuropathy, cva, breast CA,    OT comments  This 81 yo female making progress from eval in basic ADLs, will still benefit from acute OT with follow up [place] to get back to an independent to Mod I level with basic ADLs.   Follow Up Recommendations  Home health OT;Supervision - Intermittent    Equipment Recommendations  None recommended by OT (says she already has a 3n1)       Precautions / Restrictions Precautions Precautions: Fall Restrictions Weight Bearing Restrictions: No       Mobility Bed Mobility Overal bed mobility: Modified Independent Bed Mobility: Supine to Sit;Sit to Supine     Supine to sit: Modified independent (Device/Increase time) Sit to supine: Modified independent (Device/Increase time)   General bed mobility comments: increased time  Transfers Overall transfer level: Needs assistance Equipment used: Rolling walker (2 wheeled) Transfers: Sit to/from Stand Sit to Stand: Supervision         General transfer comment: Pt ambulated 125 feet on RA with sats no lower than 91%        ADL Overall ADL's : Needs assistance/impaired     Grooming: Wash/dry hands;Supervision/safety;Standing                   Toilet Transfer: Supervision/safety;Ambulation;RW;Comfort height toilet;Grab bars   Toileting- Clothing Manipulation and Hygiene: Supervision/safety;Sit to/from stand         General ADL Comments: Pt and sister educated on purse lipped breathing when pt feels SOB, use of inspirometer and flutter valve (in additon to what respiratory did with her), energy conservation strategies from handout, may need to consider getting BSC out of storage to sit over toilet if she finds herself  struggling from getting up off of toilet. She has reacher and sockaid she can use for LBD if she choses to. She will have  aids in her house 4 hours 3 days a week. Gave pt a pair of white elastic laces                Cognition   Behavior During Therapy: WFL for tasks assessed/performed Overall Cognitive Status: Within Functional Limits for tasks assessed                                    Pertinent Vitals/ Pain       Pain Assessment: No/denies pain         Frequency Min 2X/week     Progress Toward Goals  OT Goals(current goals can now be found in the care plan section)  Progress towards OT goals: Progressing toward goals     Plan Discharge plan remains appropriate       End of Session Equipment Utilized During Treatment: Gait belt;Rolling walker (NO O2 today, only got as low as 91% on RA with ambulation in hallway)   Activity Tolerance Patient tolerated treatment well   Patient Left in bed;with call bell/phone within reach;with bed alarm set   Nurse Communication  (sats only dropped to 91% on RA with ambulation)        Time: [id number] OT Time Calculation (min): 44 min  Charges: OT Treatments $Self Care/Home Management : 38-52 mins  Rolm Baptise  [place] Pier [id number] 11/15/2015, 12:39 PM

## 2015-11-15 NOTE — Care Management Important Message (Signed)
Important Message  Patient Details  Name: Yolanda Spears MRN: [id number] Date of Birth: 1934-09-09   Medicare Important Message Given:  Yes    Kushal Saunders P Jacquez Sheetz 11/15/2015, 12:57 PM

## 2015-11-15 NOTE — Progress Notes (Signed)
Physical Therapy Treatment Patient Details Name: Yolanda Spears MRN: [id number] DOB: November 02, 1934 Today's Date: 11/15/2015    History of Present Illness 81 yo female admitted acute on chronic diastolic HF, elevated troponin with chest pain PMH: HTN, periopheral neuropathy, cva, breast CA,     PT Comments    Patient progressing well towards PT goals. Improved ambulation distance but still fatigues with mobility and demonstrates dyspnea on exertion. Sp02 remains WFL on RA, >91%. Tolerated exercises sitting in chair. Will need supervision at home due to balance deficits. Will continue to follow per current POC.   Follow Up Recommendations  Home health PT;Supervision/Assistance - 24 hour     Equipment Recommendations  None recommended by PT    Recommendations for Other Services       Precautions / Restrictions Precautions Precautions: Fall Restrictions Weight Bearing Restrictions: No    Mobility  Bed Mobility Overal bed mobility: Modified Independent Bed Mobility: Supine to Sit;Sit to Supine     Supine to sit: Modified independent (Device/Increase time) Sit to supine: Modified independent (Device/Increase time)   General bed mobility comments: Up in chair.  Transfers Overall transfer level: Needs assistance Equipment used: None;Rolling walker (2 wheeled) Transfers: Sit to/from Stand Sit to Stand: Supervision         General transfer comment: Supervision for safety. Stood from [employer], from toilet x1.   Ambulation/Gait Ambulation/Gait assistance: Min assist Ambulation Distance (Feet): 150 Feet Assistive device: Rolling walker (2 wheeled) Gait Pattern/deviations: Step-through pattern;Decreased stride length;Drifts right/left Gait velocity: slow Gait velocity interpretation: Below normal speed for age/gender General Gait Details: Pt with 2/4 DOE. Sp02 stayed >91% on RA. Fatigues quickly and some drifting noted during ambulation but no overt LOB. Min A at times for  support.    Stairs            Wheelchair Mobility    Modified Rankin (Stroke Patients Only)       Balance Overall balance assessment: Needs assistance Sitting-balance support: Feet supported;No upper extremity supported Sitting balance-Leahy Scale: Good     Standing balance support: During functional activity Standing balance-Leahy Scale: Fair                      Cognition Arousal/Alertness: Awake/alert Behavior During Therapy: WFL for tasks assessed/performed Overall Cognitive Status: Within Functional Limits for tasks assessed                      Exercises General Exercises - Lower Extremity Ankle Circles/Pumps: Both;10 reps;Seated Long Arc Quad: Both;15 reps;Seated Hip ABduction/ADduction: Both;15 reps;Seated Hip Flexion/Marching: Both;15 reps;Seated    General Comments General comments (skin integrity, edema, etc.): Mod I for pericare.      Pertinent Vitals/Pain Pain Assessment: No/denies pain    Home Living                      Prior Function            PT Goals (current goals can now be found in the care plan section) Progress towards PT goals: Progressing toward goals    Frequency  Min 3X/week    PT Plan Current plan remains appropriate    Co-evaluation             End of Session Equipment Utilized During Treatment: Gait belt Activity Tolerance: Patient tolerated treatment well Patient left: in chair;with call bell/phone within reach;with chair alarm set     Time: 1415-1433 PT Time Calculation (min) (  ACUTE ONLY): 18 min  Charges:  $Gait Training: 8-22 mins                    G Codes:      Yolanda Spears A Merced Brougham 11/15/2015, 3:04 PM Yolanda Spears, [place], DPT [phone]

## 2015-11-16 ENCOUNTER — Encounter (HOSPITAL_COMMUNITY): Payer: Self-pay | Admitting: Physician Assistant

## 2015-11-16 LAB — BASIC METABOLIC PANEL
Anion gap: 12 (ref 5–15)
BUN: 44 mg/dL — AB (ref 6–20)
CALCIUM: 8.5 mg/dL — AB (ref 8.9–10.3)
CO2: 28 mmol/L (ref 22–32)
CREATININE: 2.21 mg/dL — AB (ref 0.44–1.00)
Chloride: 99 mmol/L — ABNORMAL LOW (ref 101–111)
GFR calc non Af Amer: 20 mL/min — ABNORMAL LOW (ref >60)
GFR, EST AFRICAN AMERICAN: 23 mL/min — AB (ref >60)
Glucose, Bld: 91 mg/dL (ref 65–99)
Potassium: 3.8 mmol/L (ref 3.5–5.1)
Sodium: 139 mmol/L (ref 135–145)

## 2015-11-16 LAB — PROTIME-INR
INR: 2.23 — AB (ref 0.00–1.49)
Prothrombin Time: 24.5 seconds — ABNORMAL HIGH (ref 11.6–15.2)

## 2015-11-16 MED ORDER — ISOSORBIDE MONONITRATE ER 60 MG PO TB24: 60.0000 mg | ORAL_TABLET | Freq: Every day | ORAL | Status: AC

## 2015-11-16 MED ORDER — HYDRALAZINE HCL 50 MG PO TABS: 50.0000 mg | ORAL_TABLET | Freq: Two times a day (BID) | ORAL | Status: DC

## 2015-11-16 MED ORDER — AMIODARONE HCL 200 MG PO TABS: 200.0000 mg | ORAL_TABLET | Freq: Two times a day (BID) | ORAL | Status: DC

## 2015-11-16 MED ORDER — FUROSEMIDE 40 MG PO TABS: 80.0000 mg | ORAL_TABLET | Freq: Every day | ORAL | Status: DC

## 2015-11-16 MED ORDER — METOPROLOL SUCCINATE ER 25 MG PO TB24: 25.0000 mg | ORAL_TABLET | Freq: Two times a day (BID) | ORAL | Status: AC

## 2015-11-16 NOTE — Progress Notes (Signed)
All d/c instructions explained and given to pt.  Verbalized understanding. .  Ireland Virrueta, RN. 

## 2015-11-16 NOTE — Progress Notes (Signed)
ANTICOAGULATION CONSULT NOTE - Follow Up Consult  Pharmacy Consult for warfarin Indication: atrial fibrillation  Allergies  Allergen Reactions  . Statins Other (See Comments)    Myalgias,elevated LFT studies  . Shellfish-Derived Products Nausea And Vomiting  . Iohexol Hives and Itching    Itching in eyes, one hive on face.  . Other Hives    Contrast dye   . Penicillins Itching    Patient Measurements: Height: 5\' 6"  (167.6 cm) Weight: 153 lb 3.5 oz (69.5 kg) IBW/kg (Calculated) : 59.3  Vital Signs: Temp: 97.8 Spears (36.6 C) (03/04 0530) Temp Source: Oral (03/04 0530) BP: 101/43 mmHg (03/04 1056) Pulse Rate: 61 (03/04 0530)   Estimated Creatinine Clearance: 23.1 mL/min (by C-G formula based on Cr of 1.88).  Assessment: 81 yo Spears admitted 11/08/2015 with chest pain. Patient is on coumadin for Afib. INR 2.23 (therapeutic). Noted started amiodarone on 3/6. No s/sx of bleeding noted.   Warfarin PTA dose: 2.5 mg on Wed, 1.25 mg all other days  (Last dose PTA 3/2).    Goal of Therapy:  INR 2-3 Monitor platelets by anticoagulation protocol: Yes   Plan:   - Warfarin 1.25 mg po daily - Monitor daily INR, HL, CBC and s/sx of bleeding  Angela Burke, PharmD Pharmacy Resident Pager: [phone] 11/16/2015, 12:16 PM

## 2015-11-16 NOTE — Progress Notes (Signed)
Patient Name: Yolanda Spears      SUBJECTIVE: Feels better and without oxygen. Sat yesterday and walked yesterday. Some diaphoresis this morning but her room is hot.  Past Medical History  Diagnosis Date  . Hypertension   . Peripheral neuropathy ([place])   . High cholesterol   . Persistent atrial fibrillation ([place])   . History of radiation therapy   . Partial bowel obstruction (HCC) JAN/2013    recurrent  . Colon polyp 2011  . Osteopenia 02/2013    T score -1.4 FRAX 11%/2.1%  . Stroke [facility])     "small stroke & several TIA's occasional visual problems / and occasional speach problems  . Arthritis   . Personal history of skin cancer   . Sleep apnea     unaable to tolerate mask   . Endometrial cancer ([place]) 2006    Stage IIIc  s/p radiation  . Breast cancer ([place]) 1999    LOBULAR CARCINOMA IN SITU...  . Small bowel obstruction due to adhesions ([place]) 12/28/2013  . Internal hemorrhoids   . Recurrent Clostridium difficile diarrhea   . Mobitz II     a. s/p MDT PPM     Scheduled Meds:  Scheduled Meds: . amiodarone  400 mg Oral BID  . ezetimibe  5 mg Oral Daily  . furosemide  80 mg Intravenous BID  . hydrALAZINE  50 mg Oral BID  . isosorbide mononitrate  60 mg Oral Daily  . metoprolol succinate  25 mg Oral BID  . pregabalin  75 mg Oral BID  . sodium chloride flush  3 mL Intravenous Q12H  . warfarin  1.25 mg Oral q1800  . Warfarin - Pharmacist Dosing Inpatient   Does not apply q1800   Continuous Infusions:  sodium chloride, acetaminophen, ondansetron (ZOFRAN) IV, sodium chloride flush    PHYSICAL EXAM Filed Vitals:   11/15/15 1145 11/15/15 1257 11/15/15 2113 11/16/15 0514  BP:  116/52 127/53 114/41  Pulse:  64 85 69  Temp:  98.4 F (36.9 C) 98.7 F (37.1 C) 99 F (37.2 C)  TempSrc:  Oral Oral Oral  Resp:  18 18 18   Height:      Weight:    156 lb 4.8 oz (70.897 kg)  SpO2: 91% 96% 95% 94%   Well developed and nourished in no acute distress HENT  normal Neck supple with JVP-flat Lungs much beter Regular rate and rhythm, no murmurs or gallops Abd-soft with active BS No Clubbing cyanosis edema Skin-warm and dry A & Oriented  Grossly normal sensory and motor function   TELEMETRY: Reviewed telemetry pt in *afib   Intake/Output Summary (Last 24 hours) at 11/16/15 0659 Last data filed at 11/16/15 0651  Gross per 24 hour  Intake    600 ml  Output    475 ml  Net    125 ml    LABS: Basic Metabolic Panel:  Recent Labs Lab 11/10/15 0410 11/11/15 0308 11/12/15 0544 11/13/15 0520 11/14/15 0404 11/15/15 0721  NA 139 135 139 139 137 141  K 4.1 4.1 4.4 4.0 3.8 4.0  CL 101 100* 102 104 100* 101  CO2 28 24 28 27 26 28   GLUCOSE 99 99 91 98 87 87  BUN 38* 43* 47* 48* 47* 42*  CREATININE 2.60* 2.79* 2.34* 2.29* 2.21* 2.06*  CALCIUM 8.7* 8.4* 8.8* 8.4* 8.1* 8.6*   Cardiac Enzymes: No results for input(s): CKTOTAL, CKMB, CKMBINDEX, TROPONINI in the last 72 hours. CBC:  Recent Labs Lab 11/10/15 0410 11/11/15 0308 11/12/15 0544 11/13/15 0520  WBC 5.7 7.1 6.0 7.1  HGB 9.1* 9.5* 9.6* 9.2*  HCT 29.3* 30.8* 29.4* 28.8*  MCV 93.3 93.3 92.7 94.4  PLT 173 197 172 169   PROTIME:  Recent Labs  11/14/15 0404 11/15/15 0420  LABPROT 26.3* 26.6*  INR 2.46* 2.49*   Liver Function Tests: No results for input(s): AST, ALT, ALKPHOS, BILITOT, PROT, ALBUMIN in the last 72 hours. No results for input(s): LIPASE, AMYLASE in the last 72 hours. BNP: BNP (last 3 results)  Recent Labs  11/08/15 1356  BNP 707.6*    ProBNP (last 3 results)    ASSESSMENT AND PLAN:  Principal Problem:   Acute on chronic combined systolic and diastolic HF (heart failure) (HCC) Active Problems:   Atrial fibrillation (HCC)   Pacemaker- Dual-chamber-mdt   Elevated troponin   Acute renal failure superimposed on stage 4 chronic kidney disease ([place])   Hyperthyroidism   Cardiomyopathy ([place])  Weight down BMET p  Discharge on  amio    Amiodarone initiated for afib  Continue 400 bid x 1week Continue 200 bid x 2 weeks  We'll discharge on Lasix 80 mg orally daily  Toprol 25 once daily  Isosorbide hydralazine 60/50  Follow-up RU PA 2 weeks or so Anticipate cardioversion 3 weeks    Signed, Virl Axe MD  11/16/2015

## 2015-11-16 NOTE — Discharge Summary (Signed)
Discharge Summary    Patient ID: Yolanda Spears,  MRN: [id number], DOB/AGE: 79-Mar-1936 79 y.o.  Admit date: 11/08/2015 Discharge date: 11/16/2015  Primary Care Provider: Tawanna Solo Primary Cardiologist: Dr. Mare Ferrari Electrophysiologist: Dr. Caryl Comes  Discharge Diagnoses    Principal Problem:   Acute on chronic combined systolic and diastolic HF (heart failure) ([place]) Active Problems:   Atrial fibrillation (HCC)   Pacemaker- Dual-chamber-mdt   Elevated troponin   Acute renal failure superimposed on stage 4 chronic kidney disease (HCC)   Hyperthyroidism   Cardiomyopathy ([place])   Allergies Allergies  Allergen Reactions  . Statins Other (See Comments)    Myalgias,elevated LFT studies  . Shellfish-Derived Products Nausea And Vomiting  . Contrast Media [Iodinated Diagnostic Agents] Hives  . Iohexol Hives and Itching    Itching in eyes, one hive on face.  Marland Kitchen Penicillins Itching    Diagnostic Studies/Procedures    2-D echocardiogram, 11/10/2015 - Left ventricle: The cavity size was normal. Wall thickness was  normal. Systolic function was moderately reduced. The estimated  ejection fraction was in the range of 35% to 40%. There is  akinesis of the apicalanteroseptal and apical myocardium. - Aortic valve: Valve mobility was restricted. There was moderate  stenosis. There was mild regurgitation. Peak velocity (S): 301  cm/s. Mean gradient (S): 21 mm Hg. - Mitral valve: Calcified annulus. Mildly thickened leaflets .  There was mild regurgitation. - Tricuspid valve: There was moderate regurgitation. - Pulmonary arteries: Systolic pressure was moderately increased.  PA peak pressure: 48 mm Hg (S). - Pericardium, extracardiac: There was a moderate-sized left  pleural effusion. Impressions: - Since last ECHO - wall motion abnormality is new.  Lexi scan Myoview 11/12/2015 IMPRESSION: 1. Large fixed defect involving the apex, septum, and inferior walls. No  stress-induced perfusion defect. 2. There is dyskinesis of the septum and apex. 3. Left ventricular ejection fraction 31% 4. High-risk stress test findings*.  _____________   History of Present Illness     81 yo female w/ hx S-CHF, afib w/ AV block s/p MDT PPM, CVA, Breast CA, HTN, edema. She had increasing shortness of breath and came to the hospital by EMS on the day of admission.  Hospital Course     Consultants: EP   Her BNP was elevated and her chest x-ray showed streaky atelectasis. This was felt secondary to edema and she was gently diuresed with IV Lasix. Her creatinine increased with diuresis and this was followed closely during her hospital stay.  Her troponin was also elevated and her enzymes were cycled. There were mildly elevated, raising concern for non-STEMI. An echocardiogram showed new wall motion abnormalities and left ventricular dysfunction. A stress test was performed, results above. This is a high risk study, but medical therapy was recommended because of her elevated creatinine and other comorbidities. She is not on aspirin because of Coumadin. She is on a beta blocker tolerating this well. She is also on hydralazine and nitrates.  There was suspicion that her atrial fibrillation was contributing to the CHF. Dr. Caryl Comes was consulted and felt that aspirin and verapamil could be discontinued. Her beta blocker was resumed. Because of her wall motion abnormalities and elevated cardiac enzymes, propafenone and could not be used. Because of her renal issues, dofetilide could not be used. Amiodarone was felt to be the best option. Her thyroid functions were checked. Her TSH was low and her free T4 was mildly elevated at 1.31. However, her free T3 was within normal limits  at 2.6. This will be followed as an outpatient.  Her CHADS2VASC=7. Her Coumadin was slightly subtherapeutic on admission but was therapeutic by discharge. This will be followed closely as an outpatient as she  can be cardioverted in 3 weeks if she remains therapeutic.  On 11/16/2015, she was seen by Dr. Caryl Comes and all data were reviewed. The amiodarone dosing schedule was defined. The plan for outpatient cardioversion in 3 weeks was determined and she is to have a follow-up appointment in the office within the next 2 weeks. No further inpatient workup is indicated and she is considered stable for discharge, to follow up as an outpatient.  _____________  Discharge Vitals Blood pressure 105/48, pulse 66, temperature 97.8 F (36.6 C), temperature source Oral, resp. rate 18, height 5\' 6"  (1.676 m), weight 156 lb 4.8 oz (70.897 kg), SpO2 98 %.  Filed Weights   11/14/15 0414 11/15/15 0613 11/16/15 0514  Weight: 158 lb 4.8 oz (71.804 kg) 157 lb 8 oz (71.442 kg) 156 lb 4.8 oz (70.897 kg)    Labs & Radiologic Studies     CBC Lab Results  Component Value Date   WBC 7.1 11/13/2015   HGB 9.2* 11/13/2015   HCT 28.8* 11/13/2015   MCV 94.4 11/13/2015   PLT 169 [id number]   Basic Metabolic Panel  Recent Labs  11/15/15 0721 11/16/15 0503  NA 141 139  K 4.0 3.8  CL 101 99*  CO2 28 28  GLUCOSE 87 91  BUN 42* 44*  CREATININE 2.06* 2.21*  CALCIUM 8.6* 8.5*   Lab Results  Component Value Date   INR 2.23* 11/16/2015   INR 2.49* 11/15/2015   INR 2.46* 11/14/2015   TROPONIN I  Date Value Ref Range Status  11/09/2015 0.26* <0.031 ng/mL Final  11/09/2015 0.49* <0.031 ng/mL Final  11/08/2015 0.57* <0.031 ng/mL Final   Lab Results  Component Value Date   TSH 0.068* 11/08/2015     Dg Chest 2 View  11/11/2015  CLINICAL DATA:  Infiltrate. EXAM: CHEST  2 VIEW COMPARISON:  November 08, 2015. FINDINGS: Stable cardiomegaly. Mild bilateral pleural effusions are noted. Mild bibasilar subsegmental atelectasis or edema is noted. No pneumothorax is noted. Left-sided pacemaker is unchanged in position. Bony thorax is unremarkable. IMPRESSION: Increased bilateral pleural effusions with bibasilar subsegmental  atelectasis or edema. Electronically Signed   By: Marijo Conception, M.D.   On: 11/11/2015 09:19   Nm Myocar Multi W/spect W/wall Motion / Ef  11/12/2015  CLINICAL DATA:  Chest pain EXAM: MYOCARDIAL IMAGING WITH SPECT (REST AND PHARMACOLOGIC-STRESS) GATED LEFT VENTRICULAR WALL MOTION STUDY LEFT VENTRICULAR EJECTION FRACTION TECHNIQUE: Standard myocardial SPECT imaging was performed after resting intravenous injection of 10 mCi Tc-44m sestamibi. Subsequently, intravenous infusion of Lexiscan was performed under the supervision of the Cardiology staff. At peak effect of the drug, 30 mCi Tc-68m sestamibi was injected intravenously and standard myocardial SPECT imaging was performed. Quantitative gated imaging was also performed to evaluate left ventricular wall motion, and estimate left ventricular ejection fraction. COMPARISON:  None. FINDINGS: Perfusion: There is a large fixed perfusion defect involving the septum, inferoseptal region, in the inferior wall as well as the apex. The defect is continuous and very large. No stress-induced ischemia. Wall Motion: There is dyskinesis of the septum and apex. Hypokinesis of the inferior wall. A relatively normal motion of the anterior and lateral walls. Left Ventricular Ejection Fraction: 31 % End diastolic volume 90 ml End systolic volume 63 ml IMPRESSION: 1. Large fixed defect involving the  apex, septum, and inferior walls. No stress-induced perfusion defect. 2. There is dyskinesis of the septum and apex. 3. Left ventricular ejection fraction 31% 4. High-risk stress test findings*. *2012 Appropriate Use Criteria for Coronary Revascularization Focused Update: J Am Coll Cardiol. [id number]. http://content.airportbarriers.com.aspx?articleid=[email or web address] Electronically Signed   By: Marybelle Killings M.D.   On: 11/12/2015 16:36   Dg Chest Portable 1 View  11/08/2015  CLINICAL DATA:  Worsening shortness of breath for 1 week and a half EXAM: PORTABLE CHEST 1 VIEW COMPARISON:   07/06/2014 FINDINGS: Cardiomediastinal silhouette is stable. Dual lead cardiac pacemaker is unchanged in position. Atherosclerotic calcifications of thoracic aorta. Stable probable chronic mild interstitial prominence. There is streaky right base medially atelectasis or early infiltrate. Trace right pleural effusion with blunting of the costophrenic angle. Stable chronic left basilar scarring. No pulmonary edema. IMPRESSION: Streaky right base medially atelectasis or early infiltrate. Trace right pleural effusion. No pulmonary edema. Probable chronic mild interstitial prominence. Electronically Signed   By: Lahoma Crocker M.D.   On: 11/08/2015 15:05    Disposition   Pt is being discharged home today in good condition.  Follow-up Plans & Appointments    Follow-up Information    Follow up with [facility].   Why:  They will do your home health care at your home   Contact information:   [address] [place] [address] [phone]       Follow up with Virl Axe, MD.   Specialty:  Cardiology   Why:  The office will call.   Contact information:   [id number] [address] [phone]      Discharge Instructions    (HEART FAILURE PATIENTS) Call MD:  Anytime you have any of the following symptoms: 1) 3 pound weight gain in 24 hours or 5 pounds in 1 week 2) shortness of breath, with or without a dry hacking cough 3) swelling in the hands, feet or stomach 4) if you have to sleep on extra pillows at night in order to breathe.    Complete by:  As directed      Diet - low sodium heart healthy    Complete by:  As directed      Increase activity slowly    Complete by:  As directed            Discharge Medications   Current Discharge Medication List    START taking these medications   Details  amiodarone (PACERONE) 200 MG tablet Take 1-2 tablets (200-400 mg total) by mouth 2 (two) times daily. 2 tabs bid x 1 week, then 1 tab bid x 1  week, then 1 tab daily Qty: 60 tablet, Refills: 11    isosorbide mononitrate (IMDUR) 60 MG 24 hr tablet Take 1 tablet (60 mg total) by mouth daily. Qty: 60 tablet, Refills: 11    metoprolol succinate (TOPROL-XL) 25 MG 24 hr tablet Take 1 tablet (25 mg total) by mouth 2 (two) times daily. Qty: 30 tablet, Refills: 11      CONTINUE these medications which have CHANGED   Details  furosemide (LASIX) 40 MG tablet Take 2 tablets (80 mg total) by mouth daily. Take 1/2 tablet (20 mg) by mouth daily Qty: 60 tablet, Refills: 6    hydrALAZINE (APRESOLINE) 50 MG tablet Take 1 tablet (50 mg total) by mouth 2 (two) times daily. Qty: 60 tablet, Refills: 11      CONTINUE these medications which have NOT CHANGED  Details  calcium carbonate (TUMS - DOSED IN MG ELEMENTAL CALCIUM) 500 MG chewable tablet Chew 0.5-1 tablets by mouth daily as needed for indigestion or heartburn.     Cholecalciferol (VITAMIN D-3) 5000 UNITS TABS Take 1 tablet by mouth 4 (four) times a week. Takes on Saturday, Sunday, Tuesday and Thursday    ezetimibe (ZETIA) 10 MG tablet 1/2 tablet by mouth daily Qty: 45 tablet, Refills: 1    pregabalin (LYRICA) 75 MG capsule Take 75 mg by mouth 2 (two) times daily.    verapamil (CALAN-SR) 240 MG CR tablet Take 240 mg by mouth daily.    KLOR-CON M10 10 MEQ tablet TAKE 1 TABLET (10 MEQ TOTAL) BY MOUTH DAILY. Qty: 30 tablet, Refills: 9      STOP taking these medications     propafenone (RYTHMOL) 225 MG tablet      warfarin (COUMADIN) 2.5 MG tablet           Outstanding Labs/Studies   None  Duration of Discharge Encounter   Greater than 30 minutes including physician time.  Jonetta Speak NP 11/16/2015, 2:44 PM

## 2015-11-18 ENCOUNTER — Telehealth: Payer: Self-pay | Admitting: Internal Medicine

## 2015-11-18 ENCOUNTER — Telehealth: Payer: Self-pay | Admitting: *Deleted

## 2015-11-18 ENCOUNTER — Telehealth: Payer: Self-pay | Admitting: Cardiovascular Disease

## 2015-11-18 NOTE — Telephone Encounter (Signed)
Yolanda Spears is calling to get clarification on whether [name] should stop her coumadin , per the D/C papers as well as a Nurse from [place] is telling her to take it .  Please call at [phone]   Thanks

## 2015-11-18 NOTE — Telephone Encounter (Signed)
Yolanda Spears from [place] [facility] calling requesting clarification on her taking Coumadin.  Son is calling their office questioning her taking the coumadin. He states the [facility] is telling them to take the coumadin and he told them that it was not told to them to stop her coumadin so she has been taking.  Shirlean Mylar was going to advise him if he had any questions to call our office.

## 2015-11-18 NOTE — Telephone Encounter (Signed)
Son calling (permission from Yolanda Spears to speak with him) stating that on their d/c papers it was not made clear to continue her Coumadin.  He states that she has had several TIA's and is concerned about stopping Coumadin.  The [facility] also states that she should be taking Coumadin.  According to D/C summary states to stop Coumadin. After reviewing the chart she is scheduled for a CV in 3 wks. After discussing with coworkers it was decided that she should continue the Coumadin.  The pharm in the hospital had seen her on 3/11-day of discharge and stated she should be taking Coumadin 2.5 mg (1/2- 1.25 mg) tablet daily.  Her son Yolanda Liv) states he has been giving her 1/2 of 2.5 mg every day except on Wed was to give 2.5 mg.  Advised what the pharm had noted.  Also made her a f/u appointment with Yolanda Spears for post hospital visit and discuss cardioversion.  Also made her a coumadin appointment. He verbalizes understanding and appreciates reviewing the discharge instructions with him.

## 2015-11-18 NOTE — Telephone Encounter (Signed)
She needs order to monitor frequency of CHF and [place].

## 2015-11-18 NOTE — Telephone Encounter (Signed)
Verbal OK given for standard HH orders for CHF home management. 2 times weekly x 4 weeks, 1 time weekly x 4 weeks. Advised to call if further concerns.

## 2015-11-20 ENCOUNTER — Other Ambulatory Visit: Payer: Self-pay | Admitting: *Deleted

## 2015-11-20 ENCOUNTER — Telehealth: Payer: Self-pay | Admitting: Cardiovascular Disease

## 2015-11-20 ENCOUNTER — Encounter: Payer: Self-pay | Admitting: Nurse Practitioner

## 2015-11-20 NOTE — Telephone Encounter (Signed)
Re: Single desat event w/ home physical therapy - O2 84% w/ ambulation/exercise, returned to 92-94% at rest w/in 1 min.  Pt recently d/c from [facility] on 3/11.  She has hosp f/u w Richardson Dopp on 3/21.  Discussed w/ Derek Mound RN at [facility]- Dr . Claiborne Billings saw in hospital and admitted - since Meade retiring, her understanding is Dr. Claiborne Billings would take over as primary cardiologist. Note was sent back to me to address. Also, Rodena Piety notes 1 time reading of O2 by PT assessment would be insufficient for Medicare requirements for O2 order. I informed her I will inquire on whether RN has made similar observations in home assessment.  I called Rufina Falco Phys Therapist back. She notes recommended dates of f/u and also notes patient has PCP f/u Monday. PCP office did not feel comfortable ordering PRN or continual O2 as they have not seen pt since September. Also considers that this is stemming from CHF and/or deconditioning in hospital.   Ebony Hail notes the desat event was in response to exercise, and that once rested her O2 sats recovered quickly (w/in ~1 min). Asked her if she could relay recommendation to RN upon their next home visit to assess and ambulate patient as well and report any concerns. Likely could increase lasix further but due to recent hospital adjustments and discharge, would leave alone for now if pt not reporting lingering SOB or SOB at rest.   Sees PCP Monday Sees Richardson Dopp on Tuesday.  Will route to Dr. Debara Pickett (DoD) for any recommendations today.

## 2015-11-20 NOTE — Telephone Encounter (Signed)
Pt of Dr. Mare Ferrari and Dr. Caryl Comes.  She saw Dr. Claiborne Billings for in-hospital consult. Routed to [facility].

## 2015-11-20 NOTE — Telephone Encounter (Signed)
Issue can wait until she sees Scott - he may want to do a 6 minute walk test to assess her O2 need with exercise. That would be necessary to qualify.  Dr. Lemmie Evens

## 2015-11-20 NOTE — Telephone Encounter (Signed)
Acknowledged. Will update chart w/ any notes from home health RN for Scott's review.

## 2015-11-20 NOTE — Telephone Encounter (Signed)
New Message  Called to get supplemental oxygen in the pt's home. Pt had been admitted from 12 to 311 with CHF exacerbation. Pt was seen for PT Eval on 3/14 and it was noted that she was SOB and oxygen SAT to 84%, recovered back to 92% after resting. Called PCP and they have deferred to Dr. Claiborne Billings for Oxygen order.

## 2015-11-21 ENCOUNTER — Telehealth: Payer: Self-pay | Admitting: Physician Assistant

## 2015-11-21 NOTE — Telephone Encounter (Signed)
New MEssage  Pt son requested to speak w/ RN concerning upcoming appt;  Please call back and discuss.

## 2015-11-22 ENCOUNTER — Other Ambulatory Visit: Payer: Self-pay | Admitting: *Deleted

## 2015-11-22 ENCOUNTER — Other Ambulatory Visit: Payer: Self-pay

## 2015-11-22 DIAGNOSIS — I5041 Acute combined systolic (congestive) and diastolic (congestive) heart failure: Secondary | ICD-10-CM

## 2015-11-22 MED ORDER — HYDRALAZINE HCL 50 MG PO TABS: 50.0000 mg | ORAL_TABLET | Freq: Two times a day (BID) | ORAL | Status: AC

## 2015-11-22 NOTE — Telephone Encounter (Signed)
I s/w pt who states her son has a concern that [employer]. PA will not be familiar w/her chart, previous [place] pt. I assured pt that the PA will be up to date when she comes in for her appt 11/26/15. Pt said thank you for my cb.

## 2015-11-22 NOTE — Patient Outreach (Signed)
[facility]) Care Management  11/22/2015  Yolanda Spears 04-Sep-1935 [id number]  SUBJECTIVE:  Telephone call to patient regarding EMMI heart failure RED referral. HIPAA verified with patient. Discussed and offered [facility] care management services to patient. Patient verbally agreed to receive services.  Patient states she was in the hospital for approximately 8 days. Patient confirms dates in hospital 11/08/15 to 11/16/15.  Patient states she just started weighing and recording her weight today. Patient states [facility] home [facility] is providing physical therapy 2 times a week. Patient states occupational therapist came to see her on yesterday 11/21/15. Patient states she is unsure when and if nurse will be coming back out to see her. Patient states she is still having some shortness of breath. Patient states she has an appointment with her primary doctor on Monday 11/25/15.  Patient states she will ask her doctor if she needs oxygen at home. Patient states she has an appointment scheduled at the cardiologist office with Richardson Dopp, PA on Tuesday 11/26/15.  Patient states she has additional assistance with [facility] agency providing her assistance a few hours per day.  Patient states her son has a list of her medications in his computer. States he will print off list and give to her. Patient states her son was filling her pill box but she will have to start doing it herself. Patient states she doesn't know what some of her medications are for. Patient states she will have to go over her medications with the doctor at her appointment. Patient states she is going to have her son purchase her a pulse oximetry for home use.  RNCM advised patient regarding heart failure symptoms.:  3 lb weight gain over night, 5 lb in a week, dry or productive cough, swelling in hands, feet, or stomach, shortness of breath, having to elevate head on extra pillows. Patient verbalized understanding of symptoms.  RNCM  advised patient to weigh daily and record weights and take her medication as prescribed.   ASSESSMENT: EMMI congestive heart failure RED referral> Patient will benefit from having community nurse case manager follow up for education/management of congestive heart failure.   PLAN: RNCM will refer patient to community nurse.   Quinn Plowman RN,BSN,CCM [facility] Telephonic  [phone]

## 2015-11-22 NOTE — Patient Outreach (Signed)
[facility]) Care Management  11/22/2015  Yolanda Spears [birth year] [id number]   Patient triggered RED on EMMI Heart Failure dashboard, notification sent to Quinn Plowman, RN.  Thanks, Ronnell Freshwater. [place], [place] Assistant Phone: [phone] Fax: [phone]

## 2015-11-25 ENCOUNTER — Other Ambulatory Visit: Payer: Self-pay | Admitting: *Deleted

## 2015-11-25 NOTE — Patient Outreach (Signed)
Yolanda Spears) Care Management  11/25/2015  Yolanda Spears Oct 02, 1934 [id number]  Assessment: Transition of care- initial call Referral received from telephonic care management Lorin Spears) for transition of care and heart failure education and management.  Call placed and spoke with patient. Care management coordinator introduced self and explained purpose of the call. 81 year old female with recent admission to the hospital from 3/3- 11/16/15 for congestive heart failure.  Transition of care call completed. Patient reports still gets short of breath with physical exertion. She reports being able to walk to the bathroom okay but some shortness of breath in coming back. Patient states having to do pursed-lip breathing, slowing down and taking rest period before proceeding. She also requires extra pillow to breath well when lying in bed as stated.  She reports ocassional coughing but not productive. Patient still with slight swelling to lower extremities as stated. Patient shared that she will discuss her shortness of breath and possible oxygen need with her provider on today's post hospital follow-up visit. Patient also has a scheduled appointment to follow-up with cardiologist's PA- Yolanda Spears tomorrow 3/21. Transportation is being provided by patient's son, sister or [facility] staff to follow-up visits with providers.   Patient lives alone but sister Yolanda Spears) who lives 10 minutes away provides assistance with her needs, per patient report. Her son lives in [place] and also provides some assistance to her. She reports having [place] home health services of physical therapy, nurse and occupational therapy who currently works with her. She also gets an Engineer, production from [employer] who assists her 4 hours a day as reported.  She states having all medication supplies and taking them as ordered. According to patient, she weighs herself daily and records. Encouraged patient  to continue monitoring weight and report any weight gain 3 pounds overnight or 5 pounds in a week. She shared that today's weight is 149 pounds and maintaining at that reading.   Patient denies any other immediate needs or concerns at this time. She agreed to transition of care call next week. Encouraged patient to call [facility], care management coordinator or 24-hour nurse line as necessary. Contact information provided.   Plan: Transition of care call on 12/02/15. Will follow-up need for home oxygen.   Yolanda Spears, BSN, RN-BC [facility] Management Coordinator Cell: [phone]

## 2015-11-25 NOTE — Progress Notes (Signed)
Cardiology Office Note:    Date:  11/26/2015   ID:  Yolanda Spears, DOB 06/15/1935, MRN [id number]  PCP:  Tawanna Solo, MD  Cardiologist:  Dr. Darlin Coco   Electrophysiologist:  Dr. Virl Axe   Chief Complaint  Patient presents with  . Hospitalization Follow-up    History of Present Illness:     Yolanda Spears is a 81 y.o. female with a hx of paroxysmal AFib, Coumadin anticoagulation, tachy-brady syndrome s/p pacemaker, diastolic CHF, CKD, prior TIA, carotid stenosis, vertigo, OSA intolerant to CPAP, peripheral neuropathy. She had been managed with Rythmol. She underwent adhesiolysis in 12/2013 with Dr. Johney Maine 2/2 recurrent SBO. She developed significant volume excess while undergoing rehabilitation at [facility].She had problems with C. difficile colitis in the past treated with fecal transplant. She has had chronic diarrhea. She also has a history of frequent UTIs.  Her Creatinine has steadily increased over the past 2 year.  Last seen by Dr. Mare Ferrari 9/16.  Over the past several mos, she has been noted to develop persistent AFib.  She was last seen by Yolanda Marshall, NP and Propafenone was DC'd.  She was started on Flecainide 100 mg bid.    Admitted 3/3-3/11 with a/c combined systolic and diastolic CHF c/b elevated Troponin levels and AKI on CKD.  Her Creatinine increased with IV diuresis.  Troponin levels were mildly elevated without clear trend.  Her EF was down on Echo with anteroseptal and apical akinesis.  This was concerning for NSTEMI.  Inpatient nuclear stress test was done and this was high risk with apical, septal and inferior scar.  Med Rx was recommended given her CKD.  She was tx with a combination of beta-blocker, hydralazine, nitrates. She was not put on ASA due to need for Coumadin.  She remained in AFib.  She was seen by Dr. Virl Axe as there were concerns AFib was contributing to CHF.  Her best option for rhythm control was Amiodarone (400 bid x 1  week, then 200 bid x 2 weeks, then 200 QD).  OP DCCV could be considered in 3 weeks.   CHADS2-VASc=7.   She returns for FU.  She is here today with her son. She has felt quite fatigued since leaving the hospital. Her breathing may be somewhat worse since leaving the hospital. She denies chest discomfort. She denies syncope. She sleeps on 3 pillows and this is chronic without change. She denies PND. Weights have been stable since going home. She denies any cough or wheezing. She denies any bleeding issues. She does have home health nursing and physical therapy coming out from [place]. There is a question of whether or not she may have some drops in her oxygen with exercise.   Past Medical History  Diagnosis Date  . Hypertension   . Peripheral neuropathy ([place])   . High cholesterol   . Persistent atrial fibrillation ([place])   . History of radiation therapy   . Partial bowel obstruction (HCC) JAN/2013    recurrent  . Colon polyp 2011  . Osteopenia 02/2013    T score -1.4 FRAX 11%/2.1%  . Stroke [facility])     "small stroke & several TIA's occasional visual problems / and occasional speach problems  . Arthritis   . Personal history of skin cancer   . Sleep apnea     unaable to tolerate mask   . Endometrial cancer ([place]) 2006    Stage IIIc  s/p radiation  . Breast cancer ([place]) 1999  LOBULAR CARCINOMA IN SITU...  . Small bowel obstruction due to adhesions ([place]) 12/28/2013  . Internal hemorrhoids   . Recurrent Clostridium difficile diarrhea   . Mobitz II 2013    a. s/p MDT PPM     Past Surgical History  Procedure Laterality Date  . Lymph node resection  2007  . Cholecystectomy  1990's  . Mastectomy  1999    bilaterally w/lymph node dissection  . Abdominal hysterectomy  2006    BSO  . Oophorectomy      BSO  . Exploratory laparotomy  2007    remove abdominal cyst  . Insert / replace / remove pacemaker  2007    initial placement  2007 / REPLACED 2014     . Laparoscopic lysis  intestinal adhesions  12/28/2013  . Abdominal adhesion surgery  12/28/2013  . Small intestine surgery  12/28/2013  . Laparoscopic lysis of adhesions N/A 12/28/2013    Procedure: LAPAROSCOPIC LYSIS OF ADHESIONS converted OPEN SMALL BOWEL;  Surgeon: Adin Hector, MD;  Location: [facility] ORS;  Service: General;  Laterality: N/A;  . Permanent pacemaker generator change N/A 03/23/2012    Procedure: PERMANENT PACEMAKER GENERATOR CHANGE;  Surgeon: Deboraha Sprang, MD;  Location: [facility] CATH LAB;  Service: Cardiovascular;  Laterality: N/A;  . Colonoscopy with propofol N/A 12/27/2014    Procedure: COLONOSCOPY WITH PROPOFOL;  Surgeon: Jerene Bears, MD;  Location: [facility] ENDOSCOPY;  Service: Gastroenterology;  Laterality: N/A;  . Fecal transplant N/A 12/27/2014    Procedure: FECAL TRANSPLANT;  Surgeon: Jerene Bears, MD;  Location: [facility] ENDOSCOPY;  Service: Gastroenterology;  Laterality: N/A;    Current Medications: Outpatient Prescriptions Prior to Visit  Medication Sig Dispense Refill  . calcium carbonate (TUMS - DOSED IN MG ELEMENTAL CALCIUM) 500 MG chewable tablet Chew 0.5-1 tablets by mouth daily as needed for indigestion or heartburn.     . Cholecalciferol (VITAMIN D-3) 5000 UNITS TABS Take 1 tablet by mouth 4 (four) times a week. Takes on Saturday, Sunday, Tuesday and Thursday    . ezetimibe (ZETIA) 10 MG tablet 1/2 tablet by mouth daily (Patient taking differently: Take 5 mg by mouth daily. 1/2 tablet by mouth daily) 45 tablet 1  . hydrALAZINE (APRESOLINE) 50 MG tablet Take 1 tablet (50 mg total) by mouth 2 (two) times daily. 60 tablet 11  . isosorbide mononitrate (IMDUR) 60 MG 24 hr tablet Take 1 tablet (60 mg total) by mouth daily. 60 tablet 11  . KLOR-CON M10 10 MEQ tablet TAKE 1 TABLET (10 MEQ TOTAL) BY MOUTH DAILY. 30 tablet 9  . metoprolol succinate (TOPROL-XL) 25 MG 24 hr tablet Take 1 tablet (25 mg total) by mouth 2 (two) times daily. 30 tablet 11  . pregabalin (LYRICA) 75 MG capsule Take 75 mg by mouth 2  (two) times daily.    Marland Kitchen warfarin (COUMADIN) 2.5 MG tablet Take 1/2 tablet daily until seen in coumadin clinic 90 tablet 3  . verapamil (CALAN-SR) 240 MG CR tablet Take 240 mg by mouth daily.    Marland Kitchen amiodarone (PACERONE) 200 MG tablet Take 1-2 tablets (200-400 mg total) by mouth 2 (two) times daily. 2 tabs bid x 1 week, then 1 tab bid x 1 week, then 1 tab daily 60 tablet 11  . furosemide (LASIX) 40 MG tablet Take 2 tablets (80 mg total) by mouth daily. Take 1/2 tablet (20 mg) by mouth daily 60 tablet 6   No facility-administered medications prior to visit.     Allergies:  Statins; Shellfish-derived products; Contrast media; Iohexol; and Penicillins   Social History   Social History  . Marital Status: Widowed    Spouse Name: N/A  . Number of Children: 2  . Years of Education: N/A   Occupational History  . Retired    Social History Main Topics  . Smoking status: Former Smoker -- 1.00 packs/day for 40 years    Types: Cigarettes    Quit date: 11/26/1998  . Smokeless tobacco: Never Used  . Alcohol Use: No     Comment: 09/14/11 "last drink ~ 08/31/2009"  . Drug Use: No  . Sexual Activity: No   Other Topics Concern  . None   Social History Narrative   Widowed mother of 2     Family History:  The patient's family history includes Breast cancer (age of onset: 82) in her mother; Cancer in her father; Cancer (age of onset: 45) in her mother; Hypertension in her father and mother; Uterine cancer in her mother. There is no history of Colon cancer, Esophageal cancer, Rectal cancer, or Stomach cancer.   ROS:   Please see the history of present illness.    Review of Systems  Cardiovascular: Positive for dyspnea on exertion and leg swelling.  Respiratory: Positive for cough.   All other systems reviewed and are negative.   Physical Exam:    VS:  BP 102/48 mmHg  Pulse 62  Ht 5\' 6"  (1.676 m)  Wt 156 lb (70.761 kg)  BMI 25.19 kg/m2  SpO2 90%   GEN: Well nourished, well developed, in  no acute distress HEENT: normal Neck: no JVD at 90 degrees, no masses Cardiac: Normal S1/S2,  RRR; 2/6 systolic murmur RUSB, rubs, or gallops, no edema    Respiratory:  clear to auscultation bilaterally; no wheezing, rhonchi or rales GI: soft, nontender, nondistended MS: no deformity or atrophy Skin: warm and dry Neuro: No focal deficits  Psych: Alert and oriented x 3, normal affect  Wt Readings from Last 3 Encounters:  11/26/15 156 lb (70.761 kg)  11/16/15 156 lb 4.8 oz (70.897 kg)  11/04/15 161 lb 4 oz (73.143 kg)      Studies/Labs Reviewed:     EKG:  EKG is  ordered today.  The ekg ordered today demonstrates V paced, HR 69  Pacemaker interrogation:  Persistent atrial fibrillation since DC from the hospital  Recent Labs: 02/25/2015: ALT 17 11/08/2015: B Natriuretic Peptide 707.6*; Magnesium 1.7; TSH 0.068* 11/13/2015: Hemoglobin 9.2*; Platelets 169 11/16/2015: BUN 44*; Creatinine, Ser 2.21*; Potassium 3.8; Sodium 139   Recent Lipid Panel    Component Value Date/Time   CHOL 117 02/27/2015 0715   TRIG 85 02/27/2015 0715   HDL 34* 02/27/2015 0715   CHOLHDL 3.4 02/27/2015 0715   VLDL 17 02/27/2015 0715   LDLCALC 66 02/27/2015 0715    Additional studies/ records that were reviewed today include:   Myoview 11/12/15 IMPRESSION: 1. Large fixed defect involving the apex, septum, and inferior walls. No stress-induced perfusion defect. 2. There is dyskinesis of the septum and apex. 3. Left ventricular ejection fraction 31% 4. High-risk stress test findings*.  Echo 11/10/15 EF 35-40%, anteroseptal and apical AK, mod AS, mean 21 mmHg, mild AI, MAC, mild MR, mod TR, PASP 48 mmHg, mod L pleural effusion  Carotid 6/16 Bilat ICA 1-39%; probable R Subclavian stenosis  Echo (02/05/14):  EF 55-60%, no RWMA, mild AI, mild MR, mild LAE, PASP 44 mmHg.  Carotid US (05/2013):  Bilateral ICA 40-59% - f/u 1 year  Echo (04/2012):  EF 60-65%, no RWMA, Gr 2 DD, mild AI, mild MR, mild LAE,  PASP 41 mmHg.   ASSESSMENT:     1. Chronic combined systolic and diastolic HF (heart failure) (HCC)   2. Persistent atrial fibrillation ([place])   3. Cardiomyopathy ([place])   4. Hypertensive heart disease with heart failure ([place])   5. Pacemaker- Dual-chamber-mdt   6. Aortic stenosis   7. On amiodarone therapy     PLAN:     In order of problems listed above:  1. Chronic combined systolic and diastolic CHF - Overall, her volume status appears to be stable on exam. I suspect her dyspnea is related to multiple factors including AFib, deconditioning, heart failure, underlying CAD and obesity. We discussed the importance of daily weights. Her dose of Lasix was supposed to be 80 mg QD.  Her DC papers were incorrect and she is taking Lasix 80 in A and 20 in P.    -  Decrease Lasix to 80 QD  -  BMET today  -  Ambulatory O2 sat measurement with HHRN - arrange O2 if qualifies with O2 sat < 88% with ambulation  2. Persistent AFib - Remains in AFib. Unfortunately, her INR was < 2 today.  Her fatigue may be related to AFib as well. I was able to confirm that she is still in AFib today.  Therefore, we cannot pursue DCCV at this time.    -  FU with CVRR to check INR weekly with eye towards DCCV after 3 weeks of therapeutic INR  -  Continue current taper of Amiodarone and current dose of Toprol-XL  -  Given DCM, she should not be on Verapamil.  DC Verapamil.  -  FU with me in 2 weeks.  She would like to FU with Dr. Skeet Latch in the future given Dr. Jerl Santos retirement.    Recent Labs  10/01/15 1137 11/08/15 1356 11/10/15 0410 11/11/15 0308 11/12/15 0544 11/13/15 0520 11/14/15 0404 11/15/15 0420 11/16/15 1047 11/26/15 0932  INR 2.5 1.95* 2.45* 2.28* 2.33* 2.70* 2.46* 2.49* 2.23* 1.8   3. DCM - Probable Ischemic CM given wall motion abnormalities.  She was not felt to be a candidate for cath given co-morbid illnesses and CKD.  Large scar noted on nuclear study but no ischemia.   Med Rx was recommended.  Continue beta-blocker, hydralazine, nitrates.  Her DC papers were incorrect and she has been taking Verapamil as well.  DC Verapamil.    4. HTN - BP stable.  5. PPM - FU with EP as planned.   6. Aortic stenosis - Mod by recent echo.  This will need to be followed with serial echos.     7. Amiodarone Rx -  Will need TSH, LFTs in 6 weeks.  This can be arranged at FU.  If we can get her in NSR on Amiodarone, will need to arrange PFTs with DLCO and stress the importance of annual eye exams.     Medication Adjustments/Labs and Tests Ordered: Current medicines are reviewed at length with the patient today.  Concerns regarding medicines are outlined above.  Medication changes, Labs and Tests ordered today are outlined in the Patient Instructions noted below. Patient Instructions  Medication Instructions:  1. STOP VERAPAMIL 2. DECREASE LASIX TO 80 MG DAILY  Labwork: 1. TODAY BMET  Testing/Procedures: NONE  Follow-Up: 1. Yolanda Spears, PAC 2 WEEKS SAME DAY DR. Caryl Comes IS IN THE OFFICE PER Yolanda Spears, PAC  2. DR. Oval Linsey  IN 6 WEEKS AT THE [facility]TH LINE OFFICE 3. YOU WILL NEED YOUR INR CHECKED ON A WEEKLY BASIS UNTIL CARDIOVERSION CAN BE DONE  Any Other Special Instructions Will Be Listed Below (If Applicable).  If you need a refill on your cardiac medications before your next appointment, please call your pharmacy.  Signed, Richardson Dopp, PA-C  11/26/2015 6:01 PM    [facility] [address], [place], [place]  [address] Phone: [phone]; Fax: [phone]

## 2015-11-26 ENCOUNTER — Encounter: Payer: Self-pay | Admitting: Physician Assistant

## 2015-11-26 ENCOUNTER — Ambulatory Visit (INDEPENDENT_AMBULATORY_CARE_PROVIDER_SITE_OTHER): Payer: Medicare Other | Admitting: *Deleted

## 2015-11-26 ENCOUNTER — Encounter: Payer: Self-pay | Admitting: Internal Medicine

## 2015-11-26 ENCOUNTER — Ambulatory Visit (INDEPENDENT_AMBULATORY_CARE_PROVIDER_SITE_OTHER): Payer: Medicare Other | Admitting: Physician Assistant

## 2015-11-26 VITALS — BP 102/48 | HR 62 | Ht 66.0 in | Wt 156.0 lb

## 2015-11-26 DIAGNOSIS — I4819 Other persistent atrial fibrillation: Secondary | ICD-10-CM

## 2015-11-26 DIAGNOSIS — Z5181 Encounter for therapeutic drug level monitoring: Secondary | ICD-10-CM

## 2015-11-26 DIAGNOSIS — I110 Hypertensive heart disease with heart failure: Secondary | ICD-10-CM | POA: Diagnosis not present

## 2015-11-26 DIAGNOSIS — I481 Persistent atrial fibrillation: Secondary | ICD-10-CM | POA: Diagnosis not present

## 2015-11-26 DIAGNOSIS — Z79899 Other long term (current) drug therapy: Secondary | ICD-10-CM | POA: Insufficient documentation

## 2015-11-26 DIAGNOSIS — I4891 Unspecified atrial fibrillation: Secondary | ICD-10-CM

## 2015-11-26 DIAGNOSIS — I429 Cardiomyopathy, unspecified: Secondary | ICD-10-CM | POA: Diagnosis not present

## 2015-11-26 DIAGNOSIS — I495 Sick sinus syndrome: Secondary | ICD-10-CM | POA: Diagnosis not present

## 2015-11-26 DIAGNOSIS — I5042 Chronic combined systolic (congestive) and diastolic (congestive) heart failure: Secondary | ICD-10-CM

## 2015-11-26 DIAGNOSIS — Z950 Presence of cardiac pacemaker: Secondary | ICD-10-CM

## 2015-11-26 DIAGNOSIS — I350 Nonrheumatic aortic (valve) stenosis: Secondary | ICD-10-CM | POA: Insufficient documentation

## 2015-11-26 LAB — CUP PACEART INCLINIC DEVICE CHECK
Battery Impedance: 275 Ohm
Brady Statistic AP VP Percent: 13 %
Brady Statistic AP VS Percent: 0 %
Brady Statistic AS VS Percent: 33 %
Date Time Interrogation Session: 20170321105814
Implantable Lead Implant Date: 20071017
Lead Channel Pacing Threshold Amplitude: 0.5 V
Lead Channel Pacing Threshold Pulse Width: 0.4 ms
Lead Channel Pacing Threshold Pulse Width: 0.4 ms
Lead Channel Pacing Threshold Pulse Width: 0.4 ms
Lead Channel Setting Pacing Amplitude: 2 V
Lead Channel Setting Pacing Amplitude: 2.5 V
Lead Channel Setting Sensing Sensitivity: 5.6 mV
MDC IDC LEAD IMPLANT DT: 20071017
MDC IDC LEAD LOCATION: 753859
MDC IDC LEAD LOCATION: 753860
MDC IDC MSMT BATTERY REMAINING LONGEVITY: 106 mo
MDC IDC MSMT BATTERY VOLTAGE: 2.78 V
MDC IDC MSMT LEADCHNL RA IMPEDANCE VALUE: 471 Ohm
MDC IDC MSMT LEADCHNL RA PACING THRESHOLD AMPLITUDE: 0.625 V
MDC IDC MSMT LEADCHNL RA SENSING INTR AMPL: 1 mV
MDC IDC MSMT LEADCHNL RV IMPEDANCE VALUE: 573 Ohm
MDC IDC MSMT LEADCHNL RV PACING THRESHOLD AMPLITUDE: 0.625 V
MDC IDC MSMT LEADCHNL RV SENSING INTR AMPL: 15.67 mV
MDC IDC SET LEADCHNL RV PACING PULSEWIDTH: 0.4 ms
MDC IDC STAT BRADY AS VP PERCENT: 54 %

## 2015-11-26 LAB — BASIC METABOLIC PANEL
BUN: 36 mg/dL — ABNORMAL HIGH (ref 7–25)
CHLORIDE: 98 mmol/L (ref 98–110)
CO2: 31 mmol/L (ref 20–31)
Calcium: 8.9 mg/dL (ref 8.6–10.4)
Creat: 1.95 mg/dL — ABNORMAL HIGH (ref 0.60–0.93)
GLUCOSE: 111 mg/dL — AB (ref 65–99)
POTASSIUM: 3.5 mmol/L (ref 3.5–5.3)
SODIUM: 140 mmol/L (ref 135–146)

## 2015-11-26 LAB — POCT INR: INR: 1.8

## 2015-11-26 MED ORDER — FUROSEMIDE 80 MG PO TABS: 80.0000 mg | ORAL_TABLET | Freq: Every day | ORAL | Status: DC

## 2015-11-26 NOTE — Progress Notes (Signed)
Pacemaker check in clinic. Normal device function. Threshold, sensing, and impedances consistent with previous measurements. Device programmed to maximize longevity. 80.2% AF burden--presently in AF/AFL + warfarin. No high ventricular rates noted. Device programmed at appropriate safety margins. Histogram distribution appropriate for patient activity level. Device programmed to optimize intrinsic conduction. Estimated longevity 9 years. Patient will follow up via [facility] on 6/20 and with SK in 06-2016.

## 2015-11-26 NOTE — Patient Instructions (Addendum)
Medication Instructions:  1. STOP VERAPAMIL 2. DECREASE LASIX TO 80 MG DAILY  Labwork: 1. TODAY BMET  Testing/Procedures: NONE  Follow-Up: 1. SCOTT WEAVER, PAC 2 WEEKS SAME DAY DR. Caryl Comes IS IN THE OFFICE PER SCOTT WEAVER, PAC  2. DR. Oval Linsey IN 6 WEEKS AT [place] LINE OFFICE 3. YOU WILL NEED YOUR INR CHECKED ON A WEEKLY BASIS UNTIL CARDIOVERSION CAN BE DONE  Any Other Special Instructions Will Be Listed Below (If Applicable).  If you need a refill on your cardiac medications before your next appointment, please call your pharmacy.

## 2015-11-27 ENCOUNTER — Other Ambulatory Visit: Payer: Self-pay | Admitting: *Deleted

## 2015-11-27 DIAGNOSIS — I119 Hypertensive heart disease without heart failure: Secondary | ICD-10-CM

## 2015-11-27 DIAGNOSIS — I480 Paroxysmal atrial fibrillation: Secondary | ICD-10-CM

## 2015-11-28 ENCOUNTER — Telehealth: Payer: Self-pay | Admitting: *Deleted

## 2015-11-28 NOTE — Telephone Encounter (Signed)
Late Entry- Spoke w/ Anda Kraft with [facility] yesterday afternoon. Called to give order for [facility] to perform ambulatory O2 sats and arrange O2 if meets criteria. Anda Kraft tells me she will have nurse see patient for this. Routing to Julaine Hua, [place] for her Juluis Rainier.

## 2015-12-02 ENCOUNTER — Other Ambulatory Visit: Payer: Self-pay | Admitting: *Deleted

## 2015-12-02 NOTE — Patient Outreach (Addendum)
[place] [facility] [facility]) Care Management  12/02/2015  Yolanda Spears [birth year] [id number]   Assessment: Transition of Care- week 1 Called and spoke with patient who reports that she has some good days and bad days.  Patient had been seen by primary care provider for follow-up on 3/20 and had oxygen level monitored per home health nurse related to dyspnea on exertion. Patient states she did not qualify for home oxygen since her oxygen level stayed on 90- 91% on room air. Swelling to lower extremities persist per patient. Encouraged to elevate both legs at intervals to help swelling go down. Patient was also seen by Richardson Dopp, PA (cardiology) with medication adjustments. Patient's Lasix is now at 80 mg daily and Verapamil was discontinued. Pulmonary function test will be scheduled once she remains in normal sinus rhythm with Amiodarone. Coumadin level (INR) continues to be monitored weekly until cardioversion can be done.   Patient reports that her weight had been on 149- 150 pounds without reportable weight gain. She denies productive coughing and chest tightness. She continues to use several pillows to keep her head up while in bed as mentioned. Patient reports still needing to sit and rest when walking back from the bathroom. Encouraged patient to do pursed- lip breathing, to slow down and rest as needed. Provider thinks her fatigue is related to A-fib as well. Patient informs care management coordinator that her son continues to manage her medications (fills-up her pill box) for her but takes medication on her own. [place] home health nurse, physical therapist (twice a week) and occupational therapist (twice a week) continues to work with patient. [facility] staff assist patient 4 hours a day 5 times a week since she lives by herself. Patient shares she has private-pay housekeeper that comes once a month.  Patient agreed to initial home visit next week. She has a follow-up appointment  with cardiology on 4/14. She has no additional needs or issues expressed at this time. Patient was encouraged to call [facility] management coordinator or 24-hour nurse line as needed. Patient has contact information with her.   Plan: Initial home visit on 12/10/15. Will provide [facility] heart failure packet ad [facility] calendar/ organizer. Will obtain written consent from patient.  Lauriann Milillo A. Airanna Partin, BSN, RN-BC [place] Management Coordinator Cell: [phone]

## 2015-12-03 ENCOUNTER — Other Ambulatory Visit (INDEPENDENT_AMBULATORY_CARE_PROVIDER_SITE_OTHER): Payer: Medicare Other | Admitting: *Deleted

## 2015-12-03 ENCOUNTER — Ambulatory Visit (INDEPENDENT_AMBULATORY_CARE_PROVIDER_SITE_OTHER): Payer: Medicare Other

## 2015-12-03 ENCOUNTER — Ambulatory Visit: Payer: Medicare Other | Admitting: Nurse Practitioner

## 2015-12-03 DIAGNOSIS — I481 Persistent atrial fibrillation: Secondary | ICD-10-CM | POA: Diagnosis not present

## 2015-12-03 DIAGNOSIS — Z5181 Encounter for therapeutic drug level monitoring: Secondary | ICD-10-CM

## 2015-12-03 DIAGNOSIS — I4891 Unspecified atrial fibrillation: Secondary | ICD-10-CM | POA: Diagnosis not present

## 2015-12-03 DIAGNOSIS — I4819 Other persistent atrial fibrillation: Secondary | ICD-10-CM

## 2015-12-03 DIAGNOSIS — I480 Paroxysmal atrial fibrillation: Secondary | ICD-10-CM

## 2015-12-03 DIAGNOSIS — I119 Hypertensive heart disease without heart failure: Secondary | ICD-10-CM

## 2015-12-03 LAB — POCT INR: INR: 3.2

## 2015-12-03 LAB — BASIC METABOLIC PANEL
BUN: 31 mg/dL — AB (ref 7–25)
CALCIUM: 8.7 mg/dL (ref 8.6–10.4)
CO2: 36 mmol/L — ABNORMAL HIGH (ref 20–31)
CREATININE: 1.73 mg/dL — AB (ref 0.60–0.93)
Chloride: 97 mmol/L — ABNORMAL LOW (ref 98–110)
Glucose, Bld: 114 mg/dL — ABNORMAL HIGH (ref 65–99)
POTASSIUM: 3.4 mmol/L — AB (ref 3.5–5.3)
SODIUM: 141 mmol/L (ref 135–146)

## 2015-12-04 ENCOUNTER — Telehealth: Payer: Self-pay | Admitting: *Deleted

## 2015-12-04 DIAGNOSIS — N184 Chronic kidney disease, stage 4 (severe): Secondary | ICD-10-CM

## 2015-12-04 DIAGNOSIS — I119 Hypertensive heart disease without heart failure: Secondary | ICD-10-CM

## 2015-12-04 NOTE — Telephone Encounter (Signed)
Pt notified of lab results and findings by phone with verbal understanding. Pt does not want to get lab until 4/4 when she comes in for CVRR appt, said this will make it easier on her. States always has chronic diarrhea, edema is better, weight stable. Pt also states she haas been "cheating " on her K+ and has not taken like she is supposed to. I advised pt to make sure to take K+ as directed.

## 2015-12-06 ENCOUNTER — Telehealth: Payer: Self-pay | Admitting: *Deleted

## 2015-12-06 NOTE — Telephone Encounter (Signed)
Pt notified per Brynda Rim. PA ok to get repeat labs on 4/5 , though he does want her to decrease lasix to 40 mg daily x 3 days then resume lasix 80 mg daily. Pt agreeable to plan of care.

## 2015-12-10 ENCOUNTER — Encounter (HOSPITAL_COMMUNITY): Payer: Self-pay | Admitting: Emergency Medicine

## 2015-12-10 ENCOUNTER — Encounter: Payer: Self-pay | Admitting: *Deleted

## 2015-12-10 ENCOUNTER — Inpatient Hospital Stay (HOSPITAL_COMMUNITY)
Admission: EM | Admit: 2015-12-10 | Discharge: 2015-12-14 | DRG: 291 | Disposition: A | Discharge status: Home Health | Payer: Medicare Other | Attending: Internal Medicine | Admitting: Internal Medicine

## 2015-12-10 ENCOUNTER — Emergency Department (HOSPITAL_COMMUNITY): Payer: Medicare Other

## 2015-12-10 ENCOUNTER — Other Ambulatory Visit: Payer: Self-pay | Admitting: *Deleted

## 2015-12-10 DIAGNOSIS — J189 Pneumonia, unspecified organism: Secondary | ICD-10-CM | POA: Diagnosis present

## 2015-12-10 DIAGNOSIS — J9811 Atelectasis: Secondary | ICD-10-CM | POA: Diagnosis present

## 2015-12-10 DIAGNOSIS — Z66 Do not resuscitate: Secondary | ICD-10-CM | POA: Diagnosis present

## 2015-12-10 DIAGNOSIS — I481 Persistent atrial fibrillation: Secondary | ICD-10-CM | POA: Diagnosis not present

## 2015-12-10 DIAGNOSIS — I2510 Atherosclerotic heart disease of native coronary artery without angina pectoris: Secondary | ICD-10-CM | POA: Diagnosis present

## 2015-12-10 DIAGNOSIS — Z86010 Personal history of colonic polyps: Secondary | ICD-10-CM | POA: Diagnosis not present

## 2015-12-10 DIAGNOSIS — Z853 Personal history of malignant neoplasm of breast: Secondary | ICD-10-CM

## 2015-12-10 DIAGNOSIS — G629 Polyneuropathy, unspecified: Secondary | ICD-10-CM | POA: Diagnosis present

## 2015-12-10 DIAGNOSIS — Z8673 Personal history of transient ischemic attack (TIA), and cerebral infarction without residual deficits: Secondary | ICD-10-CM

## 2015-12-10 DIAGNOSIS — N184 Chronic kidney disease, stage 4 (severe): Secondary | ICD-10-CM | POA: Diagnosis not present

## 2015-12-10 DIAGNOSIS — Z8249 Family history of ischemic heart disease and other diseases of the circulatory system: Secondary | ICD-10-CM

## 2015-12-10 DIAGNOSIS — Z8049 Family history of malignant neoplasm of other genital organs: Secondary | ICD-10-CM

## 2015-12-10 DIAGNOSIS — I248 Other forms of acute ischemic heart disease: Secondary | ICD-10-CM | POA: Diagnosis present

## 2015-12-10 DIAGNOSIS — Z91041 Radiographic dye allergy status: Secondary | ICD-10-CM | POA: Diagnosis not present

## 2015-12-10 DIAGNOSIS — Z803 Family history of malignant neoplasm of breast: Secondary | ICD-10-CM | POA: Diagnosis not present

## 2015-12-10 DIAGNOSIS — Z85828 Personal history of other malignant neoplasm of skin: Secondary | ICD-10-CM | POA: Diagnosis not present

## 2015-12-10 DIAGNOSIS — Z888 Allergy status to other drugs, medicaments and biological substances status: Secondary | ICD-10-CM | POA: Diagnosis not present

## 2015-12-10 DIAGNOSIS — Z9049 Acquired absence of other specified parts of digestive tract: Secondary | ICD-10-CM

## 2015-12-10 DIAGNOSIS — Z950 Presence of cardiac pacemaker: Secondary | ICD-10-CM | POA: Diagnosis not present

## 2015-12-10 DIAGNOSIS — M8580 Other specified disorders of bone density and structure, unspecified site: Secondary | ICD-10-CM | POA: Diagnosis present

## 2015-12-10 DIAGNOSIS — G4730 Sleep apnea, unspecified: Secondary | ICD-10-CM | POA: Diagnosis present

## 2015-12-10 DIAGNOSIS — Z9013 Acquired absence of bilateral breasts and nipples: Secondary | ICD-10-CM

## 2015-12-10 DIAGNOSIS — Z7901 Long term (current) use of anticoagulants: Secondary | ICD-10-CM

## 2015-12-10 DIAGNOSIS — Z79899 Other long term (current) drug therapy: Secondary | ICD-10-CM

## 2015-12-10 DIAGNOSIS — Z8542 Personal history of malignant neoplasm of other parts of uterus: Secondary | ICD-10-CM | POA: Diagnosis not present

## 2015-12-10 DIAGNOSIS — E7800 Pure hypercholesterolemia, unspecified: Secondary | ICD-10-CM | POA: Diagnosis present

## 2015-12-10 DIAGNOSIS — I495 Sick sinus syndrome: Secondary | ICD-10-CM | POA: Diagnosis present

## 2015-12-10 DIAGNOSIS — Z90710 Acquired absence of both cervix and uterus: Secondary | ICD-10-CM | POA: Diagnosis not present

## 2015-12-10 DIAGNOSIS — I4891 Unspecified atrial fibrillation: Secondary | ICD-10-CM | POA: Diagnosis not present

## 2015-12-10 DIAGNOSIS — I480 Paroxysmal atrial fibrillation: Secondary | ICD-10-CM | POA: Diagnosis present

## 2015-12-10 DIAGNOSIS — Z91013 Allergy to seafood: Secondary | ICD-10-CM

## 2015-12-10 DIAGNOSIS — Z87891 Personal history of nicotine dependence: Secondary | ICD-10-CM

## 2015-12-10 DIAGNOSIS — I5043 Acute on chronic combined systolic (congestive) and diastolic (congestive) heart failure: Secondary | ICD-10-CM | POA: Diagnosis present

## 2015-12-10 DIAGNOSIS — I5021 Acute systolic (congestive) heart failure: Secondary | ICD-10-CM | POA: Diagnosis not present

## 2015-12-10 DIAGNOSIS — I5031 Acute diastolic (congestive) heart failure: Secondary | ICD-10-CM | POA: Diagnosis not present

## 2015-12-10 DIAGNOSIS — R0600 Dyspnea, unspecified: Secondary | ICD-10-CM

## 2015-12-10 DIAGNOSIS — R32 Unspecified urinary incontinence: Secondary | ICD-10-CM | POA: Diagnosis present

## 2015-12-10 DIAGNOSIS — Z880 Allergy status to penicillin: Secondary | ICD-10-CM

## 2015-12-10 DIAGNOSIS — I441 Atrioventricular block, second degree: Secondary | ICD-10-CM | POA: Diagnosis present

## 2015-12-10 DIAGNOSIS — I130 Hypertensive heart and chronic kidney disease with heart failure and stage 1 through stage 4 chronic kidney disease, or unspecified chronic kidney disease: Principal | ICD-10-CM | POA: Diagnosis present

## 2015-12-10 DIAGNOSIS — Z923 Personal history of irradiation: Secondary | ICD-10-CM | POA: Diagnosis not present

## 2015-12-10 DIAGNOSIS — I4819 Other persistent atrial fibrillation: Secondary | ICD-10-CM | POA: Insufficient documentation

## 2015-12-10 DIAGNOSIS — R0602 Shortness of breath: Secondary | ICD-10-CM | POA: Diagnosis present

## 2015-12-10 DIAGNOSIS — J9621 Acute and chronic respiratory failure with hypoxia: Secondary | ICD-10-CM | POA: Insufficient documentation

## 2015-12-10 DIAGNOSIS — I350 Nonrheumatic aortic (valve) stenosis: Secondary | ICD-10-CM

## 2015-12-10 DIAGNOSIS — I5023 Acute on chronic systolic (congestive) heart failure: Secondary | ICD-10-CM | POA: Diagnosis not present

## 2015-12-10 LAB — COMPREHENSIVE METABOLIC PANEL
ALK PHOS: 89 U/L (ref 38–126)
ALT: 16 U/L (ref 14–54)
ANION GAP: 11 (ref 5–15)
AST: 22 U/L (ref 15–41)
Albumin: 3.2 g/dL — ABNORMAL LOW (ref 3.5–5.0)
BILIRUBIN TOTAL: 0.9 mg/dL (ref 0.3–1.2)
BUN: 30 mg/dL — ABNORMAL HIGH (ref 6–20)
CALCIUM: 9.1 mg/dL (ref 8.9–10.3)
CO2: 33 mmol/L — ABNORMAL HIGH (ref 22–32)
Chloride: 99 mmol/L — ABNORMAL LOW (ref 101–111)
Creatinine, Ser: 1.87 mg/dL — ABNORMAL HIGH (ref 0.44–1.00)
GFR, EST AFRICAN AMERICAN: 29 mL/min — AB (ref >60)
GFR, EST NON AFRICAN AMERICAN: 25 mL/min — AB (ref >60)
GLUCOSE: 119 mg/dL — AB (ref 65–99)
Potassium: 3.5 mmol/L (ref 3.5–5.1)
Sodium: 143 mmol/L (ref 135–145)
TOTAL PROTEIN: 6.9 g/dL (ref 6.5–8.1)

## 2015-12-10 LAB — CBC WITH DIFFERENTIAL/PLATELET
BASOS ABS: 0 10*3/uL (ref 0.0–0.1)
BASOS PCT: 0 %
Eosinophils Absolute: 0 10*3/uL (ref 0.0–0.7)
Eosinophils Relative: 0 %
HEMATOCRIT: 35.6 % — AB (ref 36.0–46.0)
Hemoglobin: 11.1 g/dL — ABNORMAL LOW (ref 12.0–15.0)
Lymphocytes Relative: 15 %
Lymphs Abs: 1.2 10*3/uL (ref 0.7–4.0)
MCH: 29.1 pg (ref 26.0–34.0)
MCHC: 31.2 g/dL (ref 30.0–36.0)
MCV: 93.4 fL (ref 78.0–100.0)
MONO ABS: 0.4 10*3/uL (ref 0.1–1.0)
Monocytes Relative: 5 %
NEUTROS ABS: 6.4 10*3/uL (ref 1.7–7.7)
NEUTROS PCT: 80 %
PLATELETS: 162 10*3/uL (ref 150–400)
RBC: 3.81 MIL/uL — AB (ref 3.87–5.11)
RDW: 14.8 % (ref 11.5–15.5)
WBC: 8 10*3/uL (ref 4.0–10.5)

## 2015-12-10 LAB — BRAIN NATRIURETIC PEPTIDE: B Natriuretic Peptide: 547.5 pg/mL — ABNORMAL HIGH (ref 0.0–100.0)

## 2015-12-10 LAB — PROTIME-INR
INR: 4.02 — ABNORMAL HIGH (ref 0.00–1.49)
PROTHROMBIN TIME: 37 s — AB (ref 11.6–15.2)

## 2015-12-10 LAB — TROPONIN I: Troponin I: 0.06 ng/mL — ABNORMAL HIGH (ref <0.031)

## 2015-12-10 MED ORDER — EZETIMIBE 10 MG PO TABS
5.0000 mg | ORAL_TABLET | Freq: Every day | ORAL | Status: DC
  Administered 2015-12-10 – 2015-12-14 (×5): 5 mg via ORAL
  Filled 2015-12-10 (×5): qty 0.5

## 2015-12-10 MED ORDER — CALCIUM CARBONATE ANTACID 500 MG PO CHEW: 0.5000 | CHEWABLE_TABLET | Freq: Every day | ORAL | Status: DC | PRN

## 2015-12-10 MED ORDER — ONDANSETRON HCL 4 MG/2ML IJ SOLN: 4.0000 mg | Freq: Four times a day (QID) | INTRAMUSCULAR | Status: DC | PRN

## 2015-12-10 MED ORDER — ACETAMINOPHEN 325 MG PO TABS: 650.0000 mg | ORAL_TABLET | ORAL | Status: DC | PRN

## 2015-12-10 MED ORDER — METOPROLOL SUCCINATE ER 25 MG PO TB24
25.0000 mg | ORAL_TABLET | Freq: Two times a day (BID) | ORAL | Status: DC
  Administered 2015-12-10 – 2015-12-14 (×7): 25 mg via ORAL
  Filled 2015-12-10 (×7): qty 1

## 2015-12-10 MED ORDER — SODIUM CHLORIDE 0.9% FLUSH
3.0000 mL | Freq: Two times a day (BID) | INTRAVENOUS | Status: DC
  Administered 2015-12-10 – 2015-12-13 (×6): 3 mL via INTRAVENOUS

## 2015-12-10 MED ORDER — WARFARIN - PHARMACIST DOSING INPATIENT: Freq: Every day | Status: DC

## 2015-12-10 MED ORDER — SODIUM CHLORIDE 0.9% FLUSH: 3.0000 mL | INTRAVENOUS | Status: DC | PRN

## 2015-12-10 MED ORDER — POTASSIUM CHLORIDE CRYS ER 20 MEQ PO TBCR
20.0000 meq | EXTENDED_RELEASE_TABLET | Freq: Two times a day (BID) | ORAL | Status: DC
  Administered 2015-12-10 – 2015-12-14 (×7): 20 meq via ORAL
  Filled 2015-12-10 (×7): qty 1

## 2015-12-10 MED ORDER — VITAMIN D 1000 UNITS PO TABS
5000.0000 [IU] | ORAL_TABLET | ORAL | Status: DC
  Administered 2015-12-10 – 2015-12-14 (×3): 5000 [IU] via ORAL
  Filled 2015-12-10 (×3): qty 5

## 2015-12-10 MED ORDER — SODIUM CHLORIDE 0.9 % IV SOLN
250.0000 mL | INTRAVENOUS | Status: DC | PRN
  Administered 2015-12-13: 12:00:00 via INTRAVENOUS

## 2015-12-10 MED ORDER — DEXTROSE 5 % IV SOLN
120.0000 mg | Freq: Once | INTRAVENOUS | Status: AC
  Administered 2015-12-10: 120 mg via INTRAVENOUS
  Filled 2015-12-10: qty 4

## 2015-12-10 MED ORDER — CETYLPYRIDINIUM CHLORIDE 0.05 % MT LIQD
7.0000 mL | Freq: Two times a day (BID) | OROMUCOSAL | Status: DC
  Administered 2015-12-11 – 2015-12-14 (×4): 7 mL via OROMUCOSAL

## 2015-12-10 MED ORDER — FUROSEMIDE 10 MG/ML IJ SOLN
60.0000 mg | Freq: Two times a day (BID) | INTRAMUSCULAR | Status: DC
  Administered 2015-12-10 – 2015-12-11 (×2): 60 mg via INTRAVENOUS
  Filled 2015-12-10 (×2): qty 6

## 2015-12-10 MED ORDER — HYDRALAZINE HCL 50 MG PO TABS
50.0000 mg | ORAL_TABLET | Freq: Two times a day (BID) | ORAL | Status: DC
  Administered 2015-12-10 – 2015-12-14 (×5): 50 mg via ORAL
  Filled 2015-12-10 (×9): qty 1

## 2015-12-10 MED ORDER — CHLORHEXIDINE GLUCONATE 0.12 % MT SOLN
15.0000 mL | Freq: Two times a day (BID) | OROMUCOSAL | Status: DC
  Administered 2015-12-10 – 2015-12-13 (×5): 15 mL via OROMUCOSAL
  Filled 2015-12-10 (×9): qty 15

## 2015-12-10 MED ORDER — ISOSORBIDE MONONITRATE ER 60 MG PO TB24
60.0000 mg | ORAL_TABLET | Freq: Every day | ORAL | Status: DC
  Administered 2015-12-11 – 2015-12-14 (×4): 60 mg via ORAL
  Filled 2015-12-10 (×4): qty 1

## 2015-12-10 MED ORDER — AMIODARONE HCL 200 MG PO TABS
200.0000 mg | ORAL_TABLET | Freq: Every day | ORAL | Status: DC
  Administered 2015-12-11 – 2015-12-14 (×4): 200 mg via ORAL
  Filled 2015-12-10 (×4): qty 1

## 2015-12-10 MED ORDER — PREGABALIN 75 MG PO CAPS
75.0000 mg | ORAL_CAPSULE | Freq: Two times a day (BID) | ORAL | Status: DC
  Administered 2015-12-10 – 2015-12-14 (×7): 75 mg via ORAL
  Filled 2015-12-10 (×7): qty 1

## 2015-12-10 NOTE — Patient Outreach (Signed)
[facility]) Care Management  12/10/2015  Yolanda Spears 1935/07/10 [id number]   Assessment: Initial home visit- transition of care week 2  Acute Visit:  Arrived at patient's one level home. Ar[facility] Go home health nurse and [facility]' caregiver were present at patient's bedside.. Patient was lying in bed propped up with 4- 5 pillows to help with breathing. Patient seemed to be gasping for air when talking. Per [facility] Go RN, oxygen level was at 85- 87% and went up to 90% at one time.  Care management coordinator assessed patient and noted wheezing to left lobes and diminished to right side. Breathing is somewhat labored using abdominal muscles, non-pitting edema to bilateral feet/ ankles. Weight today was 148.8 and 149.4 yesterday as recorded. Denies coughing or mucus production. Patient reports taking all medications today which includes Lasix.  Patient reports that condition started about 3- 4 am but she went back to sleep. She states feeling exhausted.    Care management coordinator called primary care provider's office (Dr. Sabra Heck) and spoke to triage nurse [facility]) who was notified of patient's condition/ assessment and was advised to send patient to the hospital . Visit was ended early. EMS was called. Patient's shortness of breath remains and oxygen level at room air fluctuated from 79- 87%. Oxygen with non-rebreather mask applied by first responders with oxygen level up to 99%. EMS transported patient to the hospital. Patient's son was updated by caregiver.   Plan: Will follow-up with patient after discharge from the hospital.  Edwena Felty A. Fox Salminen, BSN, RN-BC [place] Management Coordinator Cell: [phone]

## 2015-12-10 NOTE — ED Notes (Addendum)
Per EMS, patient reports having difficulty breathing since 3am this morning.  Room air saturation was 86%. Patient placed on non-rebreather. Wheezing in left lower lung field; decreased lung sounds in right lower lung field, upper lung sounds are clear. Patient has had 10 mg albuterol and .5 mg atrovent en route.

## 2015-12-10 NOTE — H&P (Signed)
PATIENT DETAILS Name: Yolanda Spears Age: 81 y.o. Sex: female Date of Birth: 1934-12-08 Admit Date: 12/10/2015 [id number] Jeani Hawking, MD Referring Physician: Dr. Vanita Panda   CHIEF COMPLAINT:  Shortness of breath since around 3 AM this morning  HPI: Yolanda Spears is a 82 y.o. female with a Past Medical History of chronic systolic heart failure, history of atrial fibrillation on Coumadin, history of tachybradycardia syndrome is s/p permanent pacemaker implantation who presents today with the above-noted complaints. Per patient she was in her usual state of health yesterday, and when to sleep in a usual manner. She woke up around 3:00 this morning short of breath. She denies any exertional dyspnea. She denies PND, but has a chronic 4 pillow orthopnea at baseline. After an hour or so, she went back to sleep, but woke up later in the morning with continued shortness of breath. She was seen by her home health RN with device her to come to the emergency room. In the ED, she was felt to have congestive heart failure, and given 120 mg of IV Lasix. By the time I saw the patient she feels significantly better than her initial presentation. She denies any fever, cough, chest pain, nausea, vomiting or abdominal pain. She is now being admitted for further evaluation and treatment.   ALLERGIES:   Allergies  Allergen Reactions  . Statins Other (See Comments)    Myalgias,elevated LFT studies  . Shellfish-Derived Products Nausea And Vomiting  . Contrast Media [Iodinated Diagnostic Agents] Hives  . Iohexol Hives and Itching    Itching in eyes, one hive on face.  Marland Kitchen Penicillins Itching    Has patient had a PCN reaction causing immediate rash, facial/tongue/throat swelling, SOB or lightheadedness with hypotension: unknown Has patient had a PCN reaction causing severe rash involving mucus membranes or skin necrosis: No Has patient had a PCN reaction that required hospitalization No Has  patient had a PCN reaction occurring within the last 10 years: No If all of the above answers are "NO", then may proceed with Cephalosporin use.     PAST MEDICAL HISTORY: Past Medical History  Diagnosis Date  . Hypertension   . Peripheral neuropathy ([place])   . High cholesterol   . Persistent atrial fibrillation ([place])   . History of radiation therapy   . Partial bowel obstruction (H[facility]) JAN/2013    recurrent  . Colon polyp 2011  . Osteopenia 02/2013    T score -1.4 FRAX 11%/2.1%  . Stroke [facility])     "small stroke & several TIA's occasional visual problems / and occasional speach problems  . Arthritis   . Personal history of skin cancer   . Sleep apnea     unaable to tolerate mask   . Endometrial cancer ([place]) 2006    Stage IIIc  s/p radiation  . Breast cancer ([place]) 1999    LOBULAR CARCINOMA IN SITU...  . Small bowel obstruction due to adhesions ([place]) 12/28/2013  . Internal hemorrhoids   . Recurrent Clostridium difficile diarrhea   . Mobitz II 2013    a. s/p MDT PPM     PAST SURGICAL HISTORY: Past Surgical History  Procedure Laterality Date  . Lymph node resection  2007  . Cholecystectomy  1990's  . Mastectomy  1999    bilaterally w/lymph node dissection  . Abdominal hysterectomy  2006    BSO  . Oophorectomy      BSO  . Exploratory laparotomy  2007  remove abdominal cyst  . Insert / replace / remove pacemaker  2007    initial placement  2007 / REPLACED 2014     . Laparoscopic lysis intestinal adhesions  12/28/2013  . Abdominal adhesion surgery  12/28/2013  . Small intestine surgery  12/28/2013  . Laparoscopic lysis of adhesions N/A 12/28/2013    Procedure: LAPAROSCOPIC LYSIS OF ADHESIONS converted OPEN SMALL BOWEL;  Surgeon: Adin Hector, MD;  Location: [facility] ORS;  Service: General;  Laterality: N/A;  . Permanent pacemaker generator change N/A 03/23/2012    Procedure: PERMANENT PACEMAKER GENERATOR CHANGE;  Surgeon: Deboraha Sprang, MD;  Location: [facility] CATH LAB;  Service:  Cardiovascular;  Laterality: N/A;  . Colonoscopy with propofol N/A 12/27/2014    Procedure: COLONOSCOPY WITH PROPOFOL;  Surgeon: Jerene Bears, MD;  Location: [facility] ENDOSCOPY;  Service: Gastroenterology;  Laterality: N/A;  . Fecal transplant N/A 12/27/2014    Procedure: FECAL TRANSPLANT;  Surgeon: Jerene Bears, MD;  Location: [facility] ENDOSCOPY;  Service: Gastroenterology;  Laterality: N/A;    MEDICATIONS AT HOME: Prior to Admission medications   Medication Sig Start Date End Date Taking? Authorizing Provider  amiodarone (PACERONE) 200 MG tablet Take 200 mg by mouth daily with breakfast.    Yes Historical Provider, MD  ezetimibe (ZETIA) 10 MG tablet 1/2 tablet by mouth daily Patient taking differently: Take 5 mg by mouth daily.  10/14/15  Yes Darlin Coco, MD  furosemide (LASIX) 40 MG tablet Take 80 mg by mouth daily with breakfast.   Yes Historical Provider, MD  hydrALAZINE (APRESOLINE) 50 MG tablet Take 1 tablet (50 mg total) by mouth 2 (two) times daily. 11/22/15  Yes Deboraha Sprang, MD  isosorbide mononitrate (IMDUR) 60 MG 24 hr tablet Take 1 tablet (60 mg total) by mouth daily. 11/16/15  Yes Rhonda G Barrett, PA-C  KLOR-CON M10 10 MEQ tablet TAKE 1 TABLET (10 MEQ TOTAL) BY MOUTH DAILY. 03/19/15  Yes Darlin Coco, MD  metoprolol succinate (TOPROL-XL) 25 MG 24 hr tablet Take 1 tablet (25 mg total) by mouth 2 (two) times daily. 11/16/15  Yes Rhonda G Barrett, PA-C  pregabalin (LYRICA) 75 MG capsule Take 75 mg by mouth 2 (two) times daily.   Yes Historical Provider, MD  calcium carbonate (TUMS - DOSED IN MG ELEMENTAL CALCIUM) 500 MG chewable tablet Chew 0.5-1 tablets by mouth daily as needed for indigestion or heartburn.     Historical Provider, MD  Cholecalciferol (VITAMIN D-3) 5000 UNITS TABS Take 1 tablet by mouth 4 (four) times a week. Takes on Saturday, Sunday, Tuesday and Thursday    Historical Provider, MD  furosemide (LASIX) 80 MG tablet Take 1 tablet (80 mg total) by mouth daily. Patient not  taking: Reported on 12/10/2015 11/26/15   Liliane Shi, PA-C  warfarin (COUMADIN) 2.5 MG tablet Take 1/2 tablet daily until seen in coumadin clinic 11/20/15   Deboraha Sprang, MD    FAMILY HISTORY: Family History  Problem Relation Age of Onset  . Cancer Mother 58    UTERINE  . Breast cancer Mother 15  . Hypertension Mother   . Uterine cancer Mother   . Cancer Father     PROSTATE  . Hypertension Father   . Colon cancer Neg Hx   . Esophageal cancer Neg Hx   . Rectal cancer Neg Hx   . Stomach cancer Neg Hx    SOCIAL HISTORY:  reports that she quit smoking about 17 years ago. Her smoking use included Cigarettes. She  has a 40 pack-year smoking history. She has never used smokeless tobacco. She reports that she does not drink alcohol or use illicit drugs. Lives at: Home Mobility:  Independent  REVIEW OF SYSTEMS:  Constitutional:   No  weight loss, night sweats,  Fevers, chills, fatigue.  HEENT:    No headaches, Dysphagia,Tooth/dental problems,Sore throat  Cardio-vascular: No chest pain, PND, anasarca, palpitations  GI:  No heartburn, indigestion, abdominal pain, nausea, vomiting, diarrhea, melena or hematochezia  Resp: No cough, hemoptysis,plueritic chest pain.   Skin:  No rash or lesions.  GU:  No dysuria, change in color of urine, no urgency or frequency.  No flank pain.  Musculoskeletal: No joint pain or swelling.  No decreased range of motion.  No back pain.  Endocrine: No heat intolerance, no cold intolerance, no polyuria, no polydipsia  Psych: No change in mood or affect. No depression or anxiety.  No memory loss.   PHYSICAL EXAM: Blood pressure 121/42, pulse 72, temperature 97.3 F (36.3 C), temperature source Oral, resp. rate 19, height 5\' 7"  (1.702 m), weight 67.132 kg (148 lb), SpO2 96 %.  General appearance :Awake, alert, not in any distress. Speech Clear. Not toxic Looking HEENT: Atraumatic and Normocephalic, pupils equally reactive to light and  accomodation Neck: supple Chest:Good air entry bilaterally, very few bibasilar rales CVS: S1 S2 regular, no murmurs.  Abdomen: Bowel sounds present, Non tender and not distended with no gaurding, rigidity or rebound. Extremities: B/L Lower Ext shows trace edema, both legs are warm to touch Neurology:  Non focal Skin:No Rash Wounds:N/A  LABS ON ADMISSION:   Recent Labs  12/10/15 1313  NA 143  K 3.5  CL 99*  CO2 33*  GLUCOSE 119*  BUN 30*  CREATININE 1.87*  CALCIUM 9.1    Recent Labs  12/10/15 1313  AST 22  ALT 16  ALKPHOS 89  BILITOT 0.9  PROT 6.9  ALBUMIN 3.2*   No results for input(s): LIPASE, AMYLASE in the last 72 hours.  Recent Labs  12/10/15 1313  WBC 8.0  NEUTROABS 6.4  HGB 11.1*  HCT 35.6*  MCV 93.4  PLT 162    Recent Labs  12/10/15 1313  TROPONINI 0.06*   No results for input(s): DDIMER in the last 72 hours. Invalid input(s): POCBNP   RADIOLOGIC STUDIES ON ADMISSION: Dg Chest 2 View  12/10/2015  CLINICAL DATA:  Shortness of breath EXAM: CHEST  2 VIEW COMPARISON:  11/11/2015 FINDINGS: Bilateral small pleural effusion, larger on the left. Dual-chamber pacer from the left, leads in unremarkable position. Stable mild cardiomegaly. There is bibasilar atelectasis or pneumonia. Apical emphysema. Egg shell calcified thoracic inlet mass correlates with multi nodular goiter in 2012 CTA. IMPRESSION: 1. Bibasilar atelectasis or pneumonia. 2. Small bilateral pleural effusion. 3. COPD. Electronically Signed   By: Monte Fantasia M.D.   On: 12/10/2015 13:44    I have personally reviewed images of chest xray    EKG: Personally reviewed. Paced rhythm  ASSESSMENT AND PLAN: Present on Admission:  . Acute systolic CHF (congestive heart failure):already much improved following 120 mg of Lasix in the emergency room. She does not appear excessively volume overload-she does have some trace edema, lungs are relatively clear with only a few rhonchi. For now we will  continue Lasix intravenously. We will admit her to a telemetry unit, recheck electrolytes in the morning. Suspect she may need interrogation of the pacemaker to see if any arrhythmias precipitated  decompensated CHF. Have consulted cardiology.  . Paroxysmal  atrial fibrillation: Continue amiodarone, metoprolol for rate control, Coumadin per pharmacy-INR is supratherapeutic today.   . Pacemaker- Dual-chamber-mdt  . CKD (chronic kidney disease) stage 4, GFR 15-29 ml/min: Creatinine close to usual baseline, follow closely.  Further plan will depend as patient's clinical course evolves and further radiologic and laboratory data become available. Patient will be monitored closely.  Above noted plan was discussed with patient/SON/SISTER  face to face at bedside, they were in agreement.   CONSULTS: Cards  DVT Prophylaxis: Coumadin  Code Status: DNR-reconfirmed with patient and family at bedside  Disposition Plan:  Discharge back home in 1-2 days  Total time spent  55 minutes.Greater than 50% of this time was spent in counseling, explanation of diagnosis, planning of further management, and coordination of care.  [place] Pager [phone]  If 7PM-7AM, please contact night-coverage [email or web address] Password [facility] 12/10/2015, 3:18 PM

## 2015-12-10 NOTE — ED Notes (Signed)
Bed: WA06 Expected date:  Expected time:  Means of arrival:  Comments: EMS asthma 81 yo

## 2015-12-10 NOTE — ED Notes (Addendum)
Patient's oxygen saturation at 90% on room air. Patient placed on 2 L [place]. Patient's oxygen brought up to 95%

## 2015-12-10 NOTE — Progress Notes (Signed)
ANTICOAGULATION CONSULT NOTE - Initial Consult  Pharmacy Consult for Warfarin Indication: atrial fibrillation  Allergies  Allergen Reactions  . Statins Other (See Comments)    Myalgias,elevated LFT studies  . Shellfish-Derived Products Nausea And Vomiting  . Contrast Media [Iodinated Diagnostic Agents] Hives  . Iohexol Hives and Itching    Itching in eyes, one hive on face.  Marland Kitchen Penicillins Itching    Has patient had a PCN reaction causing immediate rash, facial/tongue/throat swelling, SOB or lightheadedness with hypotension: unknown Has patient had a PCN reaction causing severe rash involving mucus membranes or skin necrosis: No Has patient had a PCN reaction that required hospitalization No Has patient had a PCN reaction occurring within the last 10 years: No If all of the above answers are "NO", then may proceed with Cephalosporin use.     Patient Measurements: Height: 5\' 7"  (170.2 cm) Weight: 152 lb 1.6 oz (68.992 kg) IBW/kg (Calculated) : 61.6  Vital Signs: Temp: 97.9 F (36.6 C) (04/04 1544) Temp Source: Oral (04/04 1544) BP: 124/41 mmHg (04/04 1544) Pulse Rate: 83 (04/04 1544)  Labs:  Recent Labs  12/10/15 1313  HGB 11.1*  HCT 35.6*  PLT 162  LABPROT 37.0*  INR 4.02*  CREATININE 1.87*  TROPONINI 0.06*    Estimated Creatinine Clearance: 24.1 mL/min (by C-G formula based on Cr of 1.87).   Medical History: Past Medical History  Diagnosis Date  . Hypertension   . Peripheral neuropathy ([place])   . High cholesterol   . Persistent atrial fibrillation ([place])   . History of radiation therapy   . Partial bowel obstruction (HCC) JAN/2013    recurrent  . Colon polyp 2011  . Osteopenia 02/2013    T score -1.4 FRAX 11%/2.1%  . Stroke [facility])     "small stroke & several TIA's occasional visual problems / and occasional speach problems  . Arthritis   . Personal history of skin cancer   . Sleep apnea     unaable to tolerate mask   . Endometrial cancer ([place]) 2006     Stage IIIc  s/p radiation  . Breast cancer ([place]) 1999    LOBULAR CARCINOMA IN SITU...  . Small bowel obstruction due to adhesions ([place]) 12/28/2013  . Internal hemorrhoids   . Recurrent Clostridium difficile diarrhea   . Mobitz II 2013    a. s/p MDT PPM     Medications:  Scheduled:  . [START ON 12/11/2015] amiodarone  200 mg Oral Q breakfast  . ezetimibe  5 mg Oral Daily  . [START ON 12/11/2015] furosemide  60 mg Intravenous Q12H  . hydrALAZINE  50 mg Oral BID  . isosorbide mononitrate  60 mg Oral Daily  . metoprolol succinate  25 mg Oral BID  . potassium chloride  20 mEq Oral BID  . pregabalin  75 mg Oral BID  . sodium chloride flush  3 mL Intravenous Q12H  . Vitamin D-3  1 tablet Oral Once per day on Sun Tue Thu Sat  . Warfarin - Pharmacist Dosing Inpatient   Does not apply q1800   Infusions:    Assessment:  81 yr female with PMH including CHF, AFB, tachybradycardia syndrome, pacemaker.  On warfarin PTA for anticoagulation.  Presents with shortness of breath.  Patient to be admitted for acute CHF.  Home regimen of warfarin = 1.25mg  daily except 2.5mg  on Wed, with last reported dose taken on 4/3 @ 20:00  INR upon admission (4/4) = 4.02  Pharmacy consulted to dose warfarin therapy  Goal of Therapy:  INR 2-3   Plan:   No warfarin today due to supratherapeutic INR  Check daily INR   Lawanda Holzheimer, Toribio [place], PharmD 12/10/2015,4:08 PM

## 2015-12-10 NOTE — ED Notes (Signed)
Patient transported to X-ray 

## 2015-12-10 NOTE — ED Provider Notes (Signed)
CSN: [id number]     Arrival date & time 12/10/15  1229 History   First MD Initiated Contact with Patient 12/10/15 1234     Chief Complaint  Patient presents with  . Shortness of Breath    HPI Patient p/w dyspnea.  This episode began yesterday.  Since onset Sx have been persistent, with only mild improvement with nebulized medications.  No pain , no f/c, no recent med / diet / activity changes.  No other recent illness. Patient states that she has been taking all medication as directed. She acknowledges a history of congestive heart failure, denies history of other pulmonary disease. Past Medical History  Diagnosis Date  . Hypertension   . Peripheral neuropathy ([place])   . High cholesterol   . Persistent atrial fibrillation ([place])   . History of radiation therapy   . Partial bowel obstruction (H[facility]) JAN/2013    recurrent  . Colon polyp 2011  . Osteopenia 02/2013    T score -1.4 FRAX 11%/2.1%  . Stroke [facility])     "small stroke & several TIA's occasional visual problems / and occasional speach problems  . Arthritis   . Personal history of skin cancer   . Sleep apnea     unaable to tolerate mask   . Endometrial cancer ([place]) 2006    Stage IIIc  s/p radiation  . Breast cancer ([place]) 1999    LOBULAR CARCINOMA IN SITU...  . Small bowel obstruction due to adhesions ([place]) 12/28/2013  . Internal hemorrhoids   . Recurrent Clostridium difficile diarrhea   . Mobitz II 2013    a. s/p MDT PPM    Past Surgical History  Procedure Laterality Date  . Lymph node resection  2007  . Cholecystectomy  1990's  . Mastectomy  1999    bilaterally w/lymph node dissection  . Abdominal hysterectomy  2006    BSO  . Oophorectomy      BSO  . Exploratory laparotomy  2007    remove abdominal cyst  . Insert / replace / remove pacemaker  2007    initial placement  2007 / REPLACED 2014     . Laparoscopic lysis intestinal adhesions  12/28/2013  . Abdominal adhesion surgery  12/28/2013  . Small intestine  surgery  12/28/2013  . Laparoscopic lysis of adhesions N/A 12/28/2013    Procedure: LAPAROSCOPIC LYSIS OF ADHESIONS converted OPEN SMALL BOWEL;  Surgeon: Adin Hector, MD;  Location: [facility] ORS;  Service: General;  Laterality: N/A;  . Permanent pacemaker generator change N/A 03/23/2012    Procedure: PERMANENT PACEMAKER GENERATOR CHANGE;  Surgeon: Deboraha Sprang, MD;  Location: [facility] CATH LAB;  Service: Cardiovascular;  Laterality: N/A;  . Colonoscopy with propofol N/A 12/27/2014    Procedure: COLONOSCOPY WITH PROPOFOL;  Surgeon: Jerene Bears, MD;  Location: [facility] ENDOSCOPY;  Service: Gastroenterology;  Laterality: N/A;  . Fecal transplant N/A 12/27/2014    Procedure: FECAL TRANSPLANT;  Surgeon: Jerene Bears, MD;  Location: [facility] ENDOSCOPY;  Service: Gastroenterology;  Laterality: N/A;   Family History  Problem Relation Age of Onset  . Cancer Mother 46    UTERINE  . Breast cancer Mother 57  . Hypertension Mother   . Uterine cancer Mother   . Cancer Father     PROSTATE  . Hypertension Father   . Colon cancer Neg Hx   . Esophageal cancer Neg Hx   . Rectal cancer Neg Hx   . Stomach cancer Neg Hx    Social History  Substance Use Topics  . Smoking status: Former Smoker -- 1.00 packs/day for 40 years    Types: Cigarettes    Quit date: 11/26/1998  . Smokeless tobacco: Never Used  . Alcohol Use: No     Comment: 09/14/11 "last drink ~ 08/31/2009"   OB History    Gravida Para Term Preterm AB TAB SAB Ectopic Multiple Living   2 2 2       2      Review of Systems  Constitutional:       Per HPI, otherwise negative  HENT:       Per HPI, otherwise negative  Respiratory:       Per HPI, otherwise negative  Cardiovascular:       Per HPI, otherwise negative  Gastrointestinal: Negative for vomiting.  Endocrine:       Negative aside from HPI  Genitourinary:       Neg aside from HPI   Musculoskeletal:       Per HPI, otherwise negative  Skin: Negative.   Neurological: Negative for syncope.       Allergies  Statins; Shellfish-derived products; Contrast media; Iohexol; and Penicillins  Home Medications   Prior to Admission medications   Medication Sig Start Date End Date Taking? Authorizing Provider  amiodarone (PACERONE) 200 MG tablet Take 200 mg by mouth daily.    Historical Provider, MD  calcium carbonate (TUMS - DOSED IN MG ELEMENTAL CALCIUM) 500 MG chewable tablet Chew 0.5-1 tablets by mouth daily as needed for indigestion or heartburn.     Historical Provider, MD  Cholecalciferol (VITAMIN D-3) 5000 UNITS TABS Take 1 tablet by mouth 4 (four) times a week. Takes on Saturday, Sunday, Tuesday and Thursday    Historical Provider, MD  ezetimibe (ZETIA) 10 MG tablet 1/2 tablet by mouth daily Patient taking differently: Take 5 mg by mouth daily. 1/2 tablet by mouth daily 10/14/15   Darlin Coco, MD  furosemide (LASIX) 80 MG tablet Take 1 tablet (80 mg total) by mouth daily. 11/26/15   Liliane Shi, PA-C  hydrALAZINE (APRESOLINE) 50 MG tablet Take 1 tablet (50 mg total) by mouth 2 (two) times daily. 11/22/15   Deboraha Sprang, MD  isosorbide mononitrate (IMDUR) 60 MG 24 hr tablet Take 1 tablet (60 mg total) by mouth daily. 11/16/15   Rhonda G Barrett, PA-C  KLOR-CON M10 10 MEQ tablet TAKE 1 TABLET (10 MEQ TOTAL) BY MOUTH DAILY. 03/19/15   Darlin Coco, MD  metoprolol succinate (TOPROL-XL) 25 MG 24 hr tablet Take 1 tablet (25 mg total) by mouth 2 (two) times daily. 11/16/15   Rhonda G Barrett, PA-C  pregabalin (LYRICA) 75 MG capsule Take 75 mg by mouth 2 (two) times daily.    Historical Provider, MD  warfarin (COUMADIN) 2.5 MG tablet Take 1/2 tablet daily until seen in coumadin clinic 11/20/15   Deboraha Sprang, MD   BP 125/50 mmHg  Pulse 72  Temp(Src) 97.3 F (36.3 C) (Oral)  Resp 22  Ht 5\' 7"  (1.702 m)  Wt 148 lb (67.132 kg)  BMI 23.17 kg/m2  SpO2 96% Physical Exam  Constitutional: She is oriented to person, place, and time. She appears well-developed and well-nourished.  No distress.  HENT:  Head: Normocephalic and atraumatic.  Eyes: Conjunctivae and EOM are normal.  Cardiovascular: Normal rate and regular rhythm.   Pulmonary/Chest: Accessory muscle usage present. No stridor. Tachypnea noted. She has decreased breath sounds. She has wheezes.  Abdominal: She exhibits no distension.  Musculoskeletal: She  exhibits no edema.  Neurological: She is alert and oriented to person, place, and time. No cranial nerve deficit.  Skin: Skin is warm and dry. There is pallor.  Psychiatric: She has a normal mood and affect.  Nursing note and vitals reviewed.   ED Course  Procedures (including critical care time) Labs Review Labs Reviewed  COMPREHENSIVE METABOLIC PANEL - Abnormal; Notable for the following:    Chloride 99 (*)    CO2 33 (*)    Glucose, Bld 119 (*)    BUN 30 (*)    Creatinine, Ser 1.87 (*)    Albumin 3.2 (*)    GFR calc non Af Amer 25 (*)    GFR calc Af Amer 29 (*)    All other components within normal limits  CBC WITH DIFFERENTIAL/PLATELET - Abnormal; Notable for the following:    RBC 3.81 (*)    Hemoglobin 11.1 (*)    HCT 35.6 (*)    All other components within normal limits  TROPONIN I - Abnormal; Notable for the following:    Troponin I 0.06 (*)    All other components within normal limits  BRAIN NATRIURETIC PEPTIDE - Abnormal; Notable for the following:    B Natriuretic Peptide 547.5 (*)    All other components within normal limits  PROTIME-INR - Abnormal; Notable for the following:    Prothrombin Time 37.0 (*)    INR 4.02 (*)    All other components within normal limits    Imaging Review Dg Chest 2 View  12/10/2015  CLINICAL DATA:  Shortness of breath EXAM: CHEST  2 VIEW COMPARISON:  11/11/2015 FINDINGS: Bilateral small pleural effusion, larger on the left. Dual-chamber pacer from the left, leads in unremarkable position. Stable mild cardiomegaly. There is bibasilar atelectasis or pneumonia. Apical emphysema. Egg shell calcified  thoracic inlet mass correlates with multi nodular goiter in 2012 CTA. IMPRESSION: 1. Bibasilar atelectasis or pneumonia. 2. Small bilateral pleural effusion. 3. COPD. Electronically Signed   By: Monte Fantasia M.D.   On: 12/10/2015 13:44   I have personally reviewed and evaluated these images and lab results as part of my medical decision-making.   EKG Interpretation   Date/Time:  Tuesday December 10 2015 13:28:19 EDT Ventricular Rate:  69 PR Interval:  226 QRS Duration: 126 QT Interval:  480 QTC Calculation: 514 R Axis:   69 Text Interpretation:  Ventricular-paced rhythm No further analysis  attempted due to paced rhythm Baseline wander in lead(s) V5 VENTRICULAR  PACED RHYTHM Abnormal ekg Confirmed by Carmin Muskrat  MD ([id number]) on  12/10/2015 2:22:50 PM     Pulse ox symmetry on room air 85% abnormal Pulse ox symmetry on supplemental oxygen 99% abnormal  Initial labs notable for therapeutic INR, elevated BNP, elevated troponin  On repeat exam the patient has received multiple breathing sessions, appears better.  X-ray suggests possible pneumonia, though the patient is afebrile, with no leukocytosis.   MDM  Patient with multiple medical issues, including aortic stenosis, congestive heart failure presents with dyspnea. Here the patient has already received some breathing treatments, and is improving on my initial evaluation for However, the patient is dyspneic, with decreased breath sounds, and evidence of hypoxia. Patient's evaluation consistent with heart failure exacerbation. Patient does have elevated troponin, but is appropriately anticoagulated, and this likely represents demand ischemia. Patient received 150% of her typical home on Lasix dose in addition to breathing treatments, required admission for further evaluation and management.  Carmin Muskrat, MD 12/10/15 [phone]

## 2015-12-10 NOTE — ED Notes (Signed)
MD at bedside. 

## 2015-12-11 ENCOUNTER — Other Ambulatory Visit: Payer: Self-pay

## 2015-12-11 DIAGNOSIS — I5023 Acute on chronic systolic (congestive) heart failure: Secondary | ICD-10-CM

## 2015-12-11 DIAGNOSIS — I480 Paroxysmal atrial fibrillation: Secondary | ICD-10-CM

## 2015-12-11 DIAGNOSIS — N184 Chronic kidney disease, stage 4 (severe): Secondary | ICD-10-CM

## 2015-12-11 DIAGNOSIS — I350 Nonrheumatic aortic (valve) stenosis: Secondary | ICD-10-CM

## 2015-12-11 LAB — BASIC METABOLIC PANEL
Anion gap: 9 (ref 5–15)
BUN: 30 mg/dL — ABNORMAL HIGH (ref 6–20)
CO2: 33 mmol/L — ABNORMAL HIGH (ref 22–32)
CREATININE: 1.85 mg/dL — AB (ref 0.44–1.00)
Calcium: 8.5 mg/dL — ABNORMAL LOW (ref 8.9–10.3)
Chloride: 99 mmol/L — ABNORMAL LOW (ref 101–111)
GFR, EST AFRICAN AMERICAN: 29 mL/min — AB (ref >60)
GFR, EST NON AFRICAN AMERICAN: 25 mL/min — AB (ref >60)
Glucose, Bld: 92 mg/dL (ref 65–99)
Potassium: 3.6 mmol/L (ref 3.5–5.1)
SODIUM: 141 mmol/L (ref 135–145)

## 2015-12-11 LAB — PROTIME-INR
INR: 4.66 — ABNORMAL HIGH (ref 0.00–1.49)
PROTHROMBIN TIME: 41.4 s — AB (ref 11.6–15.2)

## 2015-12-11 MED ORDER — FUROSEMIDE 10 MG/ML IJ SOLN
40.0000 mg | Freq: Two times a day (BID) | INTRAMUSCULAR | Status: DC
Start: 2015-12-12 — End: 2015-12-14
  Administered 2015-12-12 – 2015-12-13 (×4): 40 mg via INTRAVENOUS
  Filled 2015-12-11 (×4): qty 4

## 2015-12-11 NOTE — Progress Notes (Signed)
Patient is scheduled for TEE/DCCV for Monday April 10th at 10am. Will have to be transferred to [facility] via [facility]

## 2015-12-11 NOTE — Progress Notes (Signed)
[place] for Warfarin Indication: atrial fibrillation  Allergies  Allergen Reactions  . Statins Other (See Comments)    Myalgias,elevated LFT studies  . Shellfish-Derived Products Nausea And Vomiting  . Contrast Media [Iodinated Diagnostic Agents] Hives  . Iohexol Hives and Itching    Itching in eyes, one hive on face.  Marland Kitchen Penicillins Itching    Has patient had a PCN reaction causing immediate rash, facial/tongue/throat swelling, SOB or lightheadedness with hypotension: unknown Has patient had a PCN reaction causing severe rash involving mucus membranes or skin necrosis: No Has patient had a PCN reaction that required hospitalization No Has patient had a PCN reaction occurring within the last 10 years: No If all of the above answers are "NO", then may proceed with Cephalosporin use.     Patient Measurements: Height: 5\' 7"  (170.2 cm) Weight: 149 lb 14.4 oz (67.994 kg) IBW/kg (Calculated) : 61.6  Vital Signs: Temp: 97.7 F (36.5 C) (04/05 0658) Temp Source: Oral (04/05 0658) BP: 102/72 mmHg (04/05 0827) Pulse Rate: 60 (04/05 0827)  Labs:  Recent Labs  12/10/15 1313 12/11/15 0452  HGB 11.1*  --   HCT 35.6*  --   PLT 162  --   LABPROT 37.0* 41.4*  INR 4.02* 4.66*  CREATININE 1.87* 1.85*  TROPONINI 0.06*  --     Estimated Creatinine Clearance: 24.4 mL/min (by C-G formula based on Cr of 1.85).    Medications:  Scheduled:  . amiodarone  200 mg Oral Q breakfast  . antiseptic oral rinse  7 mL Mouth Rinse q12n4p  . chlorhexidine  15 mL Mouth Rinse BID  . cholecalciferol  5,000 Units Oral Once per day on Sun Tue Thu Sat  . ezetimibe  5 mg Oral Daily  . furosemide  60 mg Intravenous Q12H  . hydrALAZINE  50 mg Oral BID  . isosorbide mononitrate  60 mg Oral Daily  . metoprolol succinate  25 mg Oral BID  . potassium chloride  20 mEq Oral BID  . pregabalin  75 mg Oral BID  . sodium chloride flush  3 mL Intravenous Q12H  .  Warfarin - Pharmacist Dosing Inpatient   Does not apply q1800   Infusions:    Assessment: 81 yr female with PMH including CHF, AFB, tachybradycardia syndrome, pacemaker.  On warfarin PTA for anticoagulation.  Presents with shortness of breath.  Patient to be admitted for acute CHF.   Home regimen of warfarin = 1.25mg  daily except 2.5mg  on Wed, with last reported dose taken on 4/3 @ 20:00  INR upon admission (4/4) = 4.02  Today, 12/11/2015:   INR remains supratherapeutic and trending up (suspect d/t decompensated HF)  CBC: 4/4 revealed decreased Hgb, pltc WNL  No major drug-drug interactions (amio is home med)  Goal of Therapy:  INR 2-3   Plan:   No warfarin today due to supratherapeutic INR  Check daily INR   Doreene Eland, PharmD, BCPS.   Pager: [phone] 12/11/2015 9:19 AM

## 2015-12-11 NOTE — Evaluation (Signed)
Physical Therapy Evaluation Patient Details Name: Yolanda Spears MRN: [id number] DOB: [birth year] Today's Date: 12/11/2015   History of Present Illness  81 y.o. female with PMHx of PAF and SSS s/p pacer, CHF, HTN, peripheral neuropathy, CVA, breast cancer and admitted for acute systolic CHF   Clinical Impression  Pt admitted with above diagnosis. Pt currently with functional limitations due to the deficits listed below (see PT Problem List).  Pt will benefit from skilled PT to increase their independence and safety with mobility to allow discharge to the venue listed below.   Pt assisted to [facility] then over to recliner and SPO2 decreased to 85% on room air so 2L O2 [place] reapplied (RN notified).  Pt declined ambulation today (waiting for her personal briefs) however she is very agreeable to saturation monitoring with ambulation later.  Pt reports she has been working with [place] prior to admission.     Follow Up Recommendations Home health PT    Equipment Recommendations  None recommended by PT    Recommendations for Other Services       Precautions / Restrictions Precautions Precautions: Fall Precaution Comments: monitor sats, incontinent      Mobility  Bed Mobility Overal bed mobility: Needs Assistance Bed Mobility: Supine to Sit     Supine to sit: HOB elevated;Min guard     General bed mobility comments: provided hand for pt to self assist trunk upright  Transfers Overall transfer level: Needs assistance Equipment used: None Transfers: Sit to/from Stand;Stand Pivot Transfers Sit to Stand: Min guard Stand pivot transfers: Min guard       General transfer comment: min/guard for safety, pt reaching for armrests, bed rail for UE support, SpO2 85% room air after transfer so reapplied O2 [place]  Ambulation/Gait Ambulation/Gait assistance:  (pt politely declined, wishes to wait for her briefs (son bringing))              Tree surgeon [place] (Stroke Patients Only)       Balance Overall balance assessment: Needs assistance         Standing balance support: Single extremity supported;During functional activity Standing balance-Leahy Scale: Poor Standing balance comment: requires UE support                             Pertinent Vitals/Pain Pain Assessment: No/denies pain    Home Living Family/patient expects to be discharged to:: Private residence Living Arrangements: Alone Available Help at Discharge: Family;Friend(s);Personal care attendant;Available PRN/intermittently Type of Home: Apartment Home Access: Level entry     Home Layout: One level Home Equipment: Walker - 4 wheels;Cane - single point;Shower seat;Grab bars - toilet;Grab bars - tub/shower;Hand held shower head;Adaptive equipment Additional Comments: pt states she has care givers however is thinking of changing hours to lunch and then dinner time so they can food prep    Prior Function Level of Independence: Independent with assistive device(s)         Comments: pt reports she uses rollator     Hand Dominance        Extremity/Trunk Assessment               Lower Extremity Assessment: Generalized weakness         Communication   Communication: No difficulties  Cognition Arousal/Alertness: Awake/alert Behavior During Therapy: WFL for tasks assessed/performed Overall Cognitive Status: Within Functional Limits  for tasks assessed                      General Comments      Exercises        Assessment/Plan    PT Assessment Patient needs continued PT services  PT Diagnosis Difficulty walking   PT Problem List Cardiopulmonary status limiting activity;Decreased activity tolerance;Decreased mobility;Decreased strength  PT Treatment Interventions DME instruction;Gait training;Functional mobility training;Patient/family education;Therapeutic activities;Therapeutic exercise   PT Goals  (Current goals can be found in the Care Plan section) Acute Rehab PT Goals PT Goal Formulation: With patient Time For Goal Achievement: 12/25/15 Potential to Achieve Goals: Good    Frequency Min 3X/week   Barriers to discharge        Co-evaluation               End of Session Equipment Utilized During Treatment: Oxygen Activity Tolerance: Patient tolerated treatment well Patient left: in chair;with call bell/phone within reach;with chair alarm set Nurse Communication: Mobility status         Time: [id number] PT Time Calculation (min) (ACUTE ONLY): 16 min   Charges:   PT Evaluation $PT Eval Low Complexity: 1 Procedure     PT G Codes:        Gloyd Happ,KATHrine E 12/11/2015, 1:56 PM Carmelia Bake, PT, DPT 12/11/2015 Pager: [phone]

## 2015-12-11 NOTE — Plan of Care (Signed)
Problem: Education: Goal: Ability to demonstrate managment of disease process will improve Outcome: Completed/Met Date Met:  12/11/15 Heart failure packet reviewed with patient. She was also encouraged to review daily for reference if needed.

## 2015-12-11 NOTE — Progress Notes (Signed)
[facility] PROGRESS NOTE Interim History: 81 y.o. female with a Past Medical History of chronic systolic heart failure, history of atrial fibrillation on Coumadin, history of tachybradycardia syndrome is s/p permanent pacemaker implantation who presents today with the above-noted complaints. Per patient she was in her usual state of health yesterday, and when to sleep in a usual manner. She woke up around 3:00 this morning short of breath. She denies any exertional dyspnea. She denies PND, but has a chronic 4 pillow orthopnea at baseline. After an hour or so, she went back to sleep, but woke up later in the morning with continued shortness of breath. She was seen by her home health RN with device her to come to the emergency room. In the ED, she was felt to have congestive heart failure, and given 120 mg of IV Lasix. By the time I saw the patient she feels significantly better than her initial presentation. She denies any fever, cough, chest pain, nausea, vomiting or abdominal pain.   Filed Weights   12/10/15 1245 12/10/15 1544 12/11/15 0720  Weight: 67.132 kg (148 lb) 68.992 kg (152 lb 1.6 oz) 67.994 kg (149 lb 14.4 oz)        Intake/Output Summary (Last 24 hours) at 12/11/15 1426 Last data filed at 12/11/15 0700  Gross per 24 hour  Intake    600 ml  Output      0 ml  Net    600 ml     Assessment/Plan: Acute on Chronic systolic CHF (congestive heart failure) ([place]): appears to be secondary to exacerbation of PAF. Patient also with AS  -will continue current IV, strict intake/output and low sodium diet -per cardiology patient will need TEE/cardioversion; plan is for procedure to be done on 12/16/15  2-Aortic stenosis: stable. -TEE to reassess degree of obstruction continue medical management   3-CKD (chronic kidney disease) stage 4, GFR 15-29 ml/min (HCC) -stable overall -will follow trend, especially with diuresis   4-Paroxysmal atrial fibrillation (HCC) -S/P Pacemaker-  Dual-chamber-mdt -per cardiology continue metoprolol and also amiodarone -plan is for cardioversion on 4/10  5-supra-therapeutic INR: -continue coumadin, dose per pharmacy   6-physical deconditioning: plan is to pursuit home health physical therapy at discharge    Code Status: DNR Family Communication: no family at bedside Disposition Plan: continue inpatient, telemetry bed. Patient on IV lasix, amiodarone and coumadin   Consultants:  Cardiology   Procedures: ECHO: TEE and cardioversion planned for 12/16/15 ([facility])  Antibiotics:  None   HPI/Subjective: Afebrile, feeling weak and tired. Denies CP, nausea, vomiting and palpitations. reports still SOB, but improved.   Objective: Filed Vitals:   12/11/15 0720 12/11/15 0827 12/11/15 1041 12/11/15 1333  BP:  102/72 122/50 96/40  Pulse:  60 60 62  Temp:    97.9 F (36.6 C)  TempSrc:    Oral  Resp:    18  Height:      Weight: 67.994 kg (149 lb 14.4 oz)     SpO2:    98%     Exam:  General: Alert, awake, oriented x3, feeling weak and very tired; denies CP. Still SOB and requiring oxygen supplementation.  HEENT: No bruits, no goiter; positive mild JVD on exam Heart: rate controlled; no rubs or gallops; positive murmur; patient has trace edema bilaterally  Lungs: bibasilar crackles, no wheezing; improved air movement  Abdomen: Soft, nontender, nondistended, positive bowel sounds.  Neuro: Grossly intact, nonfocal.   Data Reviewed: Basic Metabolic Panel:  Recent Labs Lab 12/10/15  1313 12/11/15 0452  NA 143 141  K 3.5 3.6  CL 99* 99*  CO2 33* 33*  GLUCOSE 119* 92  BUN 30* 30*  CREATININE 1.87* 1.85*  CALCIUM 9.1 8.5*   Liver Function Tests:  Recent Labs Lab 12/10/15 1313  AST 22  ALT 16  ALKPHOS 89  BILITOT 0.9  PROT 6.9  ALBUMIN 3.2*   CBC:  Recent Labs Lab 12/10/15 1313  WBC 8.0  NEUTROABS 6.4  HGB 11.1*  HCT 35.6*  MCV 93.4  PLT 162   Cardiac Enzymes:  Recent Labs Lab 12/10/15 1313   TROPONINI 0.06*   BNP (last 3 results)  Recent Labs  11/08/15 1356 12/10/15 1313  BNP 707.6* 547.5*   Studies: Dg Chest 2 View  12/10/2015  CLINICAL DATA:  Shortness of breath EXAM: CHEST  2 VIEW COMPARISON:  11/11/2015 FINDINGS: Bilateral small pleural effusion, larger on the left. Dual-chamber pacer from the left, leads in unremarkable position. Stable mild cardiomegaly. There is bibasilar atelectasis or pneumonia. Apical emphysema. Egg shell calcified thoracic inlet mass correlates with multi nodular goiter in 2012 CTA. IMPRESSION: 1. Bibasilar atelectasis or pneumonia. 2. Small bilateral pleural effusion. 3. COPD. Electronically Signed   By: Monte Fantasia M.D.   On: 12/10/2015 13:44    Scheduled Meds: . amiodarone  200 mg Oral Q breakfast  . antiseptic oral rinse  7 mL Mouth Rinse q12n4p  . chlorhexidine  15 mL Mouth Rinse BID  . cholecalciferol  5,000 Units Oral Once per day on Sun Tue Thu Sat  . ezetimibe  5 mg Oral Daily  . [START ON 12/12/2015] furosemide  40 mg Intravenous Q12H  . hydrALAZINE  50 mg Oral BID  . isosorbide mononitrate  60 mg Oral Daily  . metoprolol succinate  25 mg Oral BID  . potassium chloride  20 mEq Oral BID  . pregabalin  75 mg Oral BID  . sodium chloride flush  3 mL Intravenous Q12H  . Warfarin - Pharmacist Dosing Inpatient   Does not apply q1800   Continuous Infusions:    Barton Dubois  [facility] Pager [phone]. If 7PM-7AM, please contact night-coverage at [email or web address], password [facility] 12/11/2015, 2:26 PM  LOS: 1 day

## 2015-12-11 NOTE — Care Management Note (Signed)
Case Management Note  Patient Details  Name: Yolanda Spears MRN: [id number] Date of Birth: Jan 21, 1935  Subjective/Objective:  81 y/o f admitted w/CHF. From home. Confirmed active w/[facility]-HHRN/PT/OT/aide-rep Tim following.Readmit-CHF.Hx:CKD.THN active. PT cons-await recc.                  Action/Plan:d/c plan home.   Expected Discharge Date:   (unknown)               Expected Discharge Plan:  [place]  In-House Referral:     Discharge planning Services  CM Consult  Post Acute Care Choice:  Home Health (Active w/[facility]-HHRN/PT/OT/aide) Choice offered to:     DME Arranged:    DME Agency:     HH Arranged:    HH Agency:     Status of Service:  In process, will continue to follow  Medicare Important Message Given:    Date Medicare IM Given:    Medicare IM give by:    Date Additional Medicare IM Given:    Additional Medicare Important Message give by:     If discussed at [place] of Stay Meetings, dates discussed:    Additional Comments:  Dessa Phi, RN 12/11/2015, 12:13 PM

## 2015-12-11 NOTE — Consult Note (Signed)
CARDIOLOGY CONSULT NOTE       Patient ID: Yolanda Spears MRN: [id number] DOB/AGE: November 28, 1934 81 y.o.  Admit date: 12/10/2015 Referring Physician:  Dyann Kief Primary Physician: Tawanna Solo, MD Primary Cardiologist:  [place] Reason for Consultation: CHF  Active Problems:   Pacemaker- Dual-chamber-mdt   Paroxysmal atrial fibrillation (HCC)   CKD (chronic kidney disease) stage 4, GFR 15-29 ml/min (HCC)   Aortic stenosis   Acute systolic CHF (congestive heart failure) (HCC)   HPI:  81 y.o. with PAF and SSS s/p pacer .  Admitted with increasing dyspnea PND and orthopnea.  BNP over 500 CXR with ? Atelectasis or pneumonia small effusion. Has improved with lasix Previous stroke and Hypertension CHADVASC score 4  INR Rx on admission ECG shows P sync pacing on low dose amiodarone as AAT.  No chest pain or history of CAD Echo 11/10/15 with moderate AS and EF 35-40%  Advanced Directives DNR    ROS All other systems reviewed and negative except as noted above  Past Medical History  Diagnosis Date  . Hypertension   . Peripheral neuropathy ([place])   . High cholesterol   . Persistent atrial fibrillation ([place])   . History of radiation therapy   . Partial bowel obstruction (HCC) JAN/2013    recurrent  . Colon polyp 2011  . Osteopenia 02/2013    T score -1.4 FRAX 11%/2.1%  . Stroke [facility])     "small stroke & several TIA's occasional visual problems / and occasional speach problems  . Arthritis   . Personal history of skin cancer   . Sleep apnea     unaable to tolerate mask   . Endometrial cancer ([place]) 2006    Stage IIIc  s/p radiation  . Breast cancer ([place]) 1999    LOBULAR CARCINOMA IN SITU...  . Small bowel obstruction due to adhesions ([place]) 12/28/2013  . Internal hemorrhoids   . Recurrent Clostridium difficile diarrhea   . Mobitz II 2013    a. s/p MDT PPM     Family History  Problem Relation Age of Onset  . Cancer Mother 34    UTERINE  . Breast cancer Mother 4  .  Hypertension Mother   . Uterine cancer Mother   . Cancer Father     PROSTATE  . Hypertension Father   . Colon cancer Neg Hx   . Esophageal cancer Neg Hx   . Rectal cancer Neg Hx   . Stomach cancer Neg Hx     Social History   Social History  . Marital Status: Widowed    Spouse Name: N/A  . Number of Children: 2  . Years of Education: N/A   Occupational History  . Retired    Social History Main Topics  . Smoking status: Former Smoker -- 1.00 packs/day for 40 years    Types: Cigarettes    Quit date: 11/26/1998  . Smokeless tobacco: Never Used  . Alcohol Use: No     Comment: 09/14/11 "last drink ~ 08/31/2009"  . Drug Use: No  . Sexual Activity: No   Other Topics Concern  . Not on file   Social History Narrative   Widowed mother of 2    Past Surgical History  Procedure Laterality Date  . Lymph node resection  2007  . Cholecystectomy  1990's  . Mastectomy  1999    bilaterally w/lymph node dissection  . Abdominal hysterectomy  2006    BSO  . Oophorectomy      BSO  .  Exploratory laparotomy  2007    remove abdominal cyst  . Insert / replace / remove pacemaker  2007    initial placement  2007 / REPLACED 2014     . Laparoscopic lysis intestinal adhesions  12/28/2013  . Abdominal adhesion surgery  12/28/2013  . Small intestine surgery  12/28/2013  . Laparoscopic lysis of adhesions N/A 12/28/2013    Procedure: LAPAROSCOPIC LYSIS OF ADHESIONS converted OPEN SMALL BOWEL;  Surgeon: Adin Hector, MD;  Location: [facility] ORS;  Service: General;  Laterality: N/A;  . Permanent pacemaker generator change N/A 03/23/2012    Procedure: PERMANENT PACEMAKER GENERATOR CHANGE;  Surgeon: Deboraha Sprang, MD;  Location: [facility] CATH LAB;  Service: Cardiovascular;  Laterality: N/A;  . Colonoscopy with propofol N/A 12/27/2014    Procedure: COLONOSCOPY WITH PROPOFOL;  Surgeon: Jerene Bears, MD;  Location: [facility] ENDOSCOPY;  Service: Gastroenterology;  Laterality: N/A;  . Fecal transplant N/A 12/27/2014     Procedure: FECAL TRANSPLANT;  Surgeon: Jerene Bears, MD;  Location: [facility] ENDOSCOPY;  Service: Gastroenterology;  Laterality: N/A;     . amiodarone  200 mg Oral Q breakfast  . antiseptic oral rinse  7 mL Mouth Rinse q12n4p  . chlorhexidine  15 mL Mouth Rinse BID  . cholecalciferol  5,000 Units Oral Once per day on Sun Tue Thu Sat  . ezetimibe  5 mg Oral Daily  . furosemide  60 mg Intravenous Q12H  . hydrALAZINE  50 mg Oral BID  . isosorbide mononitrate  60 mg Oral Daily  . metoprolol succinate  25 mg Oral BID  . potassium chloride  20 mEq Oral BID  . pregabalin  75 mg Oral BID  . sodium chloride flush  3 mL Intravenous Q12H  . Warfarin - Pharmacist Dosing Inpatient   Does not apply q1800      Physical Exam: Blood pressure 106/48, pulse 59, temperature 97.7 F (36.5 C), temperature source Oral, resp. rate 20, height 5\' 7"  (1.702 m), weight 67.994 kg (149 lb 14.4 oz), SpO2 93 %.   Affect appropriate Elderly white female HEENT: normal Neck supple with no adenopathy JVP normal no bruits no thyromegaly Lungs clear with no wheezing and good diaphragmatic motion Heart:  S1/S2 AS  murmur, no rub, gallop or click PMI normal Abdomen: benighn, BS positve, no tenderness, no AAA no bruit.  No HSM or HJR Distal pulses intact with no bruits Plus 1-2 edema Neuro non-focal Skin warm and dry No muscular weakness   Labs:   Lab Results  Component Value Date   WBC 8.0 12/10/2015   HGB 11.1* 12/10/2015   HCT 35.6* 12/10/2015   MCV 93.4 12/10/2015   PLT 162 12/10/2015     Recent Labs Lab 12/10/15 1313 12/11/15 0452  NA 143 141  K 3.5 3.6  CL 99* 99*  CO2 33* 33*  BUN 30* 30*  CREATININE 1.87* 1.85*  CALCIUM 9.1 8.5*  PROT 6.9  --   BILITOT 0.9  --   ALKPHOS 89  --   ALT 16  --   AST 22  --   GLUCOSE 119* 92   Lab Results  Component Value Date   CKTOTAL 61 02/26/2015   CKMB 2.0 03/19/2011   TROPONINI 0.06* 12/10/2015    Lab Results  Component Value Date   CHOL 117  02/27/2015   CHOL 188 03/26/2011   CHOL 161 03/20/2011   Lab Results  Component Value Date   HDL 34* 02/27/2015   HDL 43.80 03/26/2011  HDL 39* 03/20/2011   Lab Results  Component Value Date   LDLCALC 66 02/27/2015   LDLCALC 117* 03/26/2011   LDLCALC 82 03/20/2011   Lab Results  Component Value Date   TRIG 85 02/27/2015   TRIG 135.0 03/26/2011   TRIG 198* 03/20/2011   Lab Results  Component Value Date   CHOLHDL 3.4 02/27/2015   CHOLHDL 4 03/26/2011   CHOLHDL 4.1 03/20/2011   No results found for: LDLDIRECT    Radiology: Dg Chest 2 View  12/10/2015  CLINICAL DATA:  Shortness of breath EXAM: CHEST  2 VIEW COMPARISON:  11/11/2015 FINDINGS: Bilateral small pleural effusion, larger on the left. Dual-chamber pacer from the left, leads in unremarkable position. Stable mild cardiomegaly. There is bibasilar atelectasis or pneumonia. Apical emphysema. Egg shell calcified thoracic inlet mass correlates with multi nodular goiter in 2012 CTA. IMPRESSION: 1. Bibasilar atelectasis or pneumonia. 2. Small bilateral pleural effusion. 3. COPD. Electronically Signed   By: Monte Fantasia M.D.   On: 12/10/2015 13:44   Dg Chest 2 View  11/11/2015  CLINICAL DATA:  Infiltrate. EXAM: CHEST  2 VIEW COMPARISON:  November 08, 2015. FINDINGS: Stable cardiomegaly. Mild bilateral pleural effusions are noted. Mild bibasilar subsegmental atelectasis or edema is noted. No pneumothorax is noted. Left-sided pacemaker is unchanged in position. Bony thorax is unremarkable. IMPRESSION: Increased bilateral pleural effusions with bibasilar subsegmental atelectasis or edema. Electronically Signed   By: Marijo Conception, M.D.   On: 11/11/2015 09:19   Nm Myocar Multi W/spect W/wall Motion / Ef  11/12/2015  CLINICAL DATA:  Chest pain EXAM: MYOCARDIAL IMAGING WITH SPECT (REST AND PHARMACOLOGIC-STRESS) GATED LEFT VENTRICULAR WALL MOTION STUDY LEFT VENTRICULAR EJECTION FRACTION TECHNIQUE: Standard myocardial SPECT imaging was  performed after resting intravenous injection of 10 mCi Tc-25m sestamibi. Subsequently, intravenous infusion of Lexiscan was performed under the supervision of the Cardiology staff. At peak effect of the drug, 30 mCi Tc-42m sestamibi was injected intravenously and standard myocardial SPECT imaging was performed. Quantitative gated imaging was also performed to evaluate left ventricular wall motion, and estimate left ventricular ejection fraction. COMPARISON:  None. FINDINGS: Perfusion: There is a large fixed perfusion defect involving the septum, inferoseptal region, in the inferior wall as well as the apex. The defect is continuous and very large. No stress-induced ischemia. Wall Motion: There is dyskinesis of the septum and apex. Hypokinesis of the inferior wall. A relatively normal motion of the anterior and lateral walls. Left Ventricular Ejection Fraction: 31 % End diastolic volume 90 ml End systolic volume 63 ml IMPRESSION: 1. Large fixed defect involving the apex, septum, and inferior walls. No stress-induced perfusion defect. 2. There is dyskinesis of the septum and apex. 3. Left ventricular ejection fraction 31% 4. High-risk stress test findings*. *2012 Appropriate Use Criteria for Coronary Revascularization Focused Update: J Am Coll Cardiol. [id number]. http://content.airportbarriers.com.aspx?articleid=1201161 Electronically Signed   By: Marybelle Killings M.D.   On: 11/12/2015 16:36    EKG: P synch pacing   ASSESSMENT AND PLAN:  CHF:  Related to PAF, low EF and AS.  Continue lasix Cr stable continue hydralazine and nitrates PAF:  Rx on coumadin.  Low dose amiodarone check TSH  Interrogate pacer see what ? Burden of PAF is  Pacer:  Interrogate today telemetry show normal sensing and pacing   Signed: Jenkins Rouge 12/11/2015, 8:09 AM

## 2015-12-12 LAB — BASIC METABOLIC PANEL
ANION GAP: 10 (ref 5–15)
BUN: 29 mg/dL — ABNORMAL HIGH (ref 6–20)
CALCIUM: 9.1 mg/dL (ref 8.9–10.3)
CO2: 32 mmol/L (ref 22–32)
Chloride: 99 mmol/L — ABNORMAL LOW (ref 101–111)
Creatinine, Ser: 1.84 mg/dL — ABNORMAL HIGH (ref 0.44–1.00)
GFR calc Af Amer: 29 mL/min — ABNORMAL LOW (ref >60)
GFR calc non Af Amer: 25 mL/min — ABNORMAL LOW (ref >60)
GLUCOSE: 93 mg/dL (ref 65–99)
Potassium: 4.1 mmol/L (ref 3.5–5.1)
Sodium: 141 mmol/L (ref 135–145)

## 2015-12-12 LAB — PROTIME-INR
INR: 3.72 — ABNORMAL HIGH (ref 0.00–1.49)
Prothrombin Time: 34.9 seconds — ABNORMAL HIGH (ref 11.6–15.2)

## 2015-12-12 LAB — TSH: TSH: 0.028 u[IU]/mL — AB (ref 0.350–4.500)

## 2015-12-12 MED ORDER — SODIUM CHLORIDE 0.9 % IV SOLN
INTRAVENOUS | Status: DC
  Administered 2015-12-13: 500 mL via INTRAVENOUS

## 2015-12-12 NOTE — Progress Notes (Signed)
Physical Therapy Treatment Patient Details Name: JONATHA HABICHT MRN: [id number] DOB: 1934-12-10 Today's Date: 2015-12-16    History of Present Illness 81 y.o. female with PMHx of PAF and SSS s/p pacer, CHF, HTN, peripheral neuropathy, CVA, breast cancer and admitted for acute systolic CHF     PT Comments    Pt assisted OOB to [facility] and then recliner.  Pt remained on 2L O2 [place] and reports fatigue and moderate SOB today. SPO2 97% upon sitting in recliner.  Follow Up Recommendations  Home health PT     Equipment Recommendations  None recommended by PT    Recommendations for Other Services       Precautions / Restrictions Precautions Precautions: Fall Precaution Comments: monitor sats, incontinent    Mobility  Bed Mobility Overal bed mobility: Needs Assistance Bed Mobility: Supine to Sit     Supine to sit: HOB elevated;Min guard     General bed mobility comments: provided hand for pt to self assist trunk upright  Transfers Overall transfer level: Needs assistance Equipment used: None   Sit to Stand: Min guard Stand pivot transfers: Min guard       General transfer comment: min/guard for safety, pt reaching for armrests, bed rail for UE support, remained on 2L O2 [place] (RN reported that pt's SpO2 dropped this morning with transfer)  Ambulation/Gait Ambulation/Gait assistance:  (pt again felt unable to tolerate ambulation today)               Stairs            Wheelchair Mobility    Modified Rankin (Stroke Patients Only)       Balance                                    Cognition Arousal/Alertness: Awake/alert Behavior During Therapy: WFL for tasks assessed/performed Overall Cognitive Status: Within Functional Limits for tasks assessed                      Exercises General Exercises - Lower Extremity Ankle Circles/Pumps: AROM;Both;10 reps Long Arc Quad: AROM;Both;10 reps Hip ABduction/ADduction: AROM;Both;10  reps Straight Leg Raises: AAROM;Both;10 reps    General Comments        Pertinent Vitals/Pain Pain Assessment: No/denies pain    Home Living                      Prior Function            PT Goals (current goals can now be found in the care plan section) Progress towards PT goals: Progressing toward goals    Frequency  Min 3X/week    PT Plan Current plan remains appropriate    Co-evaluation             End of Session Equipment Utilized During Treatment: Oxygen Activity Tolerance: Patient limited by fatigue Patient left: in chair;with call bell/phone within reach;with chair alarm set     Time: 1134-1151 PT Time Calculation (min) (ACUTE ONLY): 17 min  Charges:  $Therapeutic Activity: 8-22 mins                    G Codes:      Teruko Joswick,KATHrine E 2015/12/16, 12:40 PM Carmelia Bake, PT, DPT 12-16-15 Pager: [phone]

## 2015-12-12 NOTE — Progress Notes (Signed)
[facility] PROGRESS NOTE Interim History: 81 y.o. female with a Past Medical History of chronic systolic heart failure, history of atrial fibrillation on Coumadin, history of tachybradycardia syndrome is s/p permanent pacemaker implantation who presents today with the above-noted complaints. Per patient she was in her usual state of health yesterday, and when to sleep in a usual manner. She woke up around 3:00 this morning short of breath. She denies any exertional dyspnea. She denies PND, but has a chronic 4 pillow orthopnea at baseline. After an hour or so, she went back to sleep, but woke up later in the morning with continued shortness of breath. She was seen by her home health RN with device her to come to the emergency room. In the ED, she was felt to have congestive heart failure, and given 120 mg of IV Lasix. By the time I saw the patient she feels significantly better than her initial presentation. She denies any fever, cough, chest pain, nausea, vomiting or abdominal pain.   Filed Weights   12/10/15 1544 12/11/15 0720 12/12/15 0507  Weight: 68.992 kg (152 lb 1.6 oz) 67.994 kg (149 lb 14.4 oz) 67 kg (147 lb 11.3 oz)        Intake/Output Summary (Last 24 hours) at 12/12/15 1058 Last data filed at 12/12/15 0910  Gross per 24 hour  Intake      3 ml  Output    200 ml  Net   -197 ml     Assessment/Plan: Acute on Chronic systolic CHF (congestive heart failure) ([place]): -  appears to be secondary to exacerbation of PAF. Patient also with AS  - Continue with IV diuresis, managed by cardiology , strict intake/output and low sodium diet - per cardiology patient will need TEE/cardioversion; plan is for procedure to be done tomorrow - Patient with elevated troponin, cardiology input appreciated  Aortic stenosis: - mod by 2D ECHO 11/10/15.  - continue medical management   CKD (chronic kidney disease) stage 4, GFR 15-29 ml/min (HCC) -stable overall -will follow trend, especially  with diuresis   Paroxysmal atrial fibrillation (HCC) -S/P Pacemaker- Dual-chamber-mdt -per cardiology continue metoprolol and also amiodarone -on coumadin for CHADSVASC score of 7 (CHF, HTN, Age, F sex, CVA)  supra-therapeutic INR: -continue coumadin, dose per pharmacy   physical deconditioning:  - plan is to pursuit home health physical therapy at discharge    SSS s/p MDT PPM:  - Device functioning normally.  Code Status: DNR Family Communication: no family at bedside Disposition Plan: continue inpatient, telemetry bed. Patient on IV lasix, amiodarone and coumadin   Consultants:  Cardiology   Procedures: ECHO: TEE and cardioversion planned for 12/16/15 ([facility])  Antibiotics:  None   HPI/Subjective: Afebrile, feeling weak and tired. Denies CP, nausea, vomiting and palpitations. reports still SOB, but improved.   Objective: Filed Vitals:   12/11/15 1041 12/11/15 1333 12/11/15 1926 12/12/15 0507  BP: 122/50 96/40 108/38 112/49  Pulse: 60 62 59 70  Temp:  97.9 F (36.6 C) 98 F (36.7 C) 97.3 F (36.3 C)  TempSrc:  Oral Oral Axillary  Resp:  18 16 16   Height:      Weight:    67 kg (147 lb 11.3 oz)  SpO2:  98% 99% 93%     Exam:  General: Alert, awake, oriented x3, HEENT: No bruits, no goiter; positive mild JVD on exam Heart: rate controlled; no rubs or gallops; positive murmur; patient has trace edema bilaterally  Lungs: bibasilar crackles, no wheezing;  improved air movement  Abdomen: Soft, nontender, nondistended, positive bowel sounds.  Neuro: Grossly intact, nonfocal.   Data Reviewed: Basic Metabolic Panel:  Recent Labs Lab 12/10/15 1313 12/11/15 0452 12/12/15 0510  NA 143 141 141  K 3.5 3.6 4.1  CL 99* 99* 99*  CO2 33* 33* 32  GLUCOSE 119* 92 93  BUN 30* 30* 29*  CREATININE 1.87* 1.85* 1.84*  CALCIUM 9.1 8.5* 9.1   Liver Function Tests:  Recent Labs Lab 12/10/15 1313  AST 22  ALT 16  ALKPHOS 89  BILITOT 0.9  PROT 6.9  ALBUMIN  3.2*   CBC:  Recent Labs Lab 12/10/15 1313  WBC 8.0  NEUTROABS 6.4  HGB 11.1*  HCT 35.6*  MCV 93.4  PLT 162   Cardiac Enzymes:  Recent Labs Lab 12/10/15 1313  TROPONINI 0.06*   BNP (last 3 results)  Recent Labs  11/08/15 1356 12/10/15 1313  BNP 707.6* 547.5*   Studies: Dg Chest 2 View  12/10/2015  CLINICAL DATA:  Shortness of breath EXAM: CHEST  2 VIEW COMPARISON:  11/11/2015 FINDINGS: Bilateral small pleural effusion, larger on the left. Dual-chamber pacer from the left, leads in unremarkable position. Stable mild cardiomegaly. There is bibasilar atelectasis or pneumonia. Apical emphysema. Egg shell calcified thoracic inlet mass correlates with multi nodular goiter in 2012 CTA. IMPRESSION: 1. Bibasilar atelectasis or pneumonia. 2. Small bilateral pleural effusion. 3. COPD. Electronically Signed   By: Monte Fantasia M.D.   On: 12/10/2015 13:44    Scheduled Meds: . amiodarone  200 mg Oral Q breakfast  . antiseptic oral rinse  7 mL Mouth Rinse q12n4p  . chlorhexidine  15 mL Mouth Rinse BID  . cholecalciferol  5,000 Units Oral Once per day on Sun Tue Thu Sat  . ezetimibe  5 mg Oral Daily  . furosemide  40 mg Intravenous Q12H  . hydrALAZINE  50 mg Oral BID  . isosorbide mononitrate  60 mg Oral Daily  . metoprolol succinate  25 mg Oral BID  . potassium chloride  20 mEq Oral BID  . pregabalin  75 mg Oral BID  . sodium chloride flush  3 mL Intravenous Q12H  . Warfarin - Pharmacist Dosing Inpatient   Does not apply q1800   Continuous Infusions:   Time spent: 30 minutes Reka Wist  [facility] Pager [phone]. If 7PM-7AM, please contact night-coverage at [email or web address], password [facility] 12/12/2015, 10:58 AM  LOS: 2 days

## 2015-12-12 NOTE — Progress Notes (Signed)
Pharmacist Heart Failure Core Measure Documentation  Assessment: Yolanda Spears has an EF documented as 35-40% on 11/10/15 by ECHO.  Rationale: Heart failure patients with left ventricular systolic dysfunction (LVSD) and an EF < 40% should be prescribed an angiotensin converting enzyme inhibitor (ACEI) or angiotensin receptor blocker (ARB) at discharge unless a contraindication is documented in the medical record.  This patient is not currently on an ACEI or ARB for HF.  This note is being placed in the record in order to provide documentation that a contraindication to the use of these agents is present for this encounter.  ACE Inhibitor or Angiotensin Receptor Blocker is contraindicated (specify all that apply)  []   ACEI allergy AND ARB allergy []   Angioedema [x]   Moderate or severe aortic stenosis []   Hyperkalemia [x]   Hypotension []   Renal artery stenosis [x]   Worsening renal function, preexisting renal disease or dysfunction   Yolanda Spears 12/12/2015 8:25 AM

## 2015-12-12 NOTE — Progress Notes (Signed)
[place] for Warfarin Indication: atrial fibrillation  Allergies  Allergen Reactions  . Statins Other (See Comments)    Myalgias,elevated LFT studies  . Shellfish-Derived Products Nausea And Vomiting  . Contrast Media [Iodinated Diagnostic Agents] Hives  . Iohexol Hives and Itching    Itching in eyes, one hive on face.  Marland Kitchen Penicillins Itching    Has patient had a PCN reaction causing immediate rash, facial/tongue/throat swelling, SOB or lightheadedness with hypotension: unknown Has patient had a PCN reaction causing severe rash involving mucus membranes or skin necrosis: No Has patient had a PCN reaction that required hospitalization No Has patient had a PCN reaction occurring within the last 10 years: No If all of the above answers are "NO", then may proceed with Cephalosporin use.     Patient Measurements: Height: 5\' 7"  (170.2 cm) Weight: 147 lb 11.3 oz (67 kg) IBW/kg (Calculated) : 61.6  Vital Signs: Temp: 97.3 F (36.3 C) (04/06 0507) Temp Source: Axillary (04/06 0507) BP: 112/49 mmHg (04/06 0507) Pulse Rate: 70 (04/06 0507)  Labs:  Recent Labs  12/10/15 1313 12/11/15 0452 12/12/15 0510  HGB 11.1*  --   --   HCT 35.6*  --   --   PLT 162  --   --   LABPROT 37.0* 41.4* 34.9*  INR 4.02* 4.66* 3.72*  CREATININE 1.87* 1.85* 1.84*  TROPONINI 0.06*  --   --     Estimated Creatinine Clearance: 24.5 mL/min (by C-G formula based on Cr of 1.84).    Medications:  Scheduled:  . amiodarone  200 mg Oral Q breakfast  . antiseptic oral rinse  7 mL Mouth Rinse q12n4p  . chlorhexidine  15 mL Mouth Rinse BID  . cholecalciferol  5,000 Units Oral Once per day on Sun Tue Thu Sat  . ezetimibe  5 mg Oral Daily  . furosemide  40 mg Intravenous Q12H  . hydrALAZINE  50 mg Oral BID  . isosorbide mononitrate  60 mg Oral Daily  . metoprolol succinate  25 mg Oral BID  . potassium chloride  20 mEq Oral BID  . pregabalin  75 mg Oral BID  .  sodium chloride flush  3 mL Intravenous Q12H  . Warfarin - Pharmacist Dosing Inpatient   Does not apply q1800   Infusions:    Assessment: 81 yr female with PMH including CHF, AFB, tachybradycardia syndrome, pacemaker.  On warfarin PTA for anticoagulation.  Presents with shortness of breath.  Patient to be admitted for acute CHF.   Home regimen of warfarin = 1.25mg  daily except 2.5mg  on Wed, with last reported dose taken on 4/3 @ 20:00  INR upon admission (4/4) = 4.02  Today, 12/12/2015:   INR remains supratherapeutic, now trending down (suspect d/t decompensated HF)  CBC: 4/4 revealed decreased Hgb, pltc WNL  No major drug-drug interactions (amio is home med)  Goal of Therapy:  INR 2-3   Plan:   No warfarin today due to supratherapeutic INR  Check daily INR   CBC in am  Doreene Eland, PharmD, BCPS.   Pager: [phone] 12/12/2015 9:06 AM

## 2015-12-12 NOTE — Consult Note (Signed)
   [facility] CM Inpatient Consult   12/12/2015  LAZETTE LEMLEY 1935/04/16 [id number]    Patient is currently active with [facility] program for long-term disease management services. Please chart review tab then notes for patient outreach encounters by [facility].  Patient will receive post hospital discharge transition of care calls and will be assessed for monthly home visits for assessments and for education. Spoke with patient at bedside. She is agreeable to ongoing follow up from [facility]. Spoke with inpatient RNCM about patient. Noted patient is also active with [facility] as well. Contact information left at bedside. Patient appreciative of visit.   Marthenia Rolling, MSN-Ed, RN,BSN [facility] Liaison [phone]

## 2015-12-12 NOTE — Progress Notes (Signed)
Patient Name: Yolanda Spears Date of Encounter: 12/12/2015     Active Problems:   Pacemaker- Dual-chamber-mdt   Paroxysmal atrial fibrillation (HCC)   CKD (chronic kidney disease) stage 4, GFR 15-29 ml/min (HCC)   Aortic stenosis   Acute systolic CHF (congestive heart failure) (HCC)    SUBJECTIVE  Feeling much better in terms of SOB. Agrees to TEE/DCCV tomorrow  CURRENT MEDS . amiodarone  200 mg Oral Q breakfast  . antiseptic oral rinse  7 mL Mouth Rinse q12n4p  . chlorhexidine  15 mL Mouth Rinse BID  . cholecalciferol  5,000 Units Oral Once per day on Sun Tue Thu Sat  . ezetimibe  5 mg Oral Daily  . furosemide  40 mg Intravenous Q12H  . hydrALAZINE  50 mg Oral BID  . isosorbide mononitrate  60 mg Oral Daily  . metoprolol succinate  25 mg Oral BID  . potassium chloride  20 mEq Oral BID  . pregabalin  75 mg Oral BID  . sodium chloride flush  3 mL Intravenous Q12H  . Warfarin - Pharmacist Dosing Inpatient   Does not apply q1800    OBJECTIVE  Filed Vitals:   12/11/15 1041 12/11/15 1333 12/11/15 1926 12/12/15 0507  BP: 122/50 96/40 108/38 112/49  Pulse: 60 62 59 70  Temp:  97.9 F (36.6 C) 98 F (36.7 C) 97.3 F (36.3 C)  TempSrc:  Oral Oral Axillary  Resp:  18 16 16   Height:      Weight:    147 lb 11.3 oz (67 kg)  SpO2:  98% 99% 93%    Intake/Output Summary (Last 24 hours) at 12/12/15 0929 Last data filed at 12/12/15 0910  Gross per 24 hour  Intake      3 ml  Output    200 ml  Net   -197 ml   Filed Weights   12/10/15 1544 12/11/15 0720 12/12/15 0507  Weight: 152 lb 1.6 oz (68.992 kg) 149 lb 14.4 oz (67.994 kg) 147 lb 11.3 oz (67 kg)    PHYSICAL EXAM  General: Pleasant, NAD. Neuro: Alert and oriented X 3. Moves all extremities spontaneously. Psych: Normal affect. HEENT:  Normal  Neck: Supple without bruits or JVD. Lungs:  Resp regular and unlabored, crackles at bases. Heart: RRR no s3, s4, or + SEM at RUSB. Abdomen: Soft, non-tender,  non-distended, BS + x 4.  Extremities: No clubbing, cyanosis or 1+ LE edema. DP/PT/Radials 2+ and equal bilaterally.  Accessory Clinical Findings  CBC  Recent Labs  12/10/15 1313  WBC 8.0  NEUTROABS 6.4  HGB 11.1*  HCT 35.6*  MCV 93.4  PLT 0000000   Basic Metabolic Panel  Recent Labs  12/11/15 0452 12/12/15 0510  NA 141 141  K 3.6 4.1  CL 99* 99*  CO2 33* 32  GLUCOSE 92 93  BUN 30* 29*  CREATININE 1.85* 1.84*  CALCIUM 8.5* 9.1   Liver Function Tests  Recent Labs  12/10/15 1313  AST 22  ALT 16  ALKPHOS 89  BILITOT 0.9  PROT 6.9  ALBUMIN 3.2*   No results for input(s): LIPASE, AMYLASE in the last 72 hours. Cardiac Enzymes  Recent Labs  12/10/15 1313  TROPONINI 0.06*    TELE  Paced 12/12/2015   Radiology/Studies  Dg Chest 2 View  12/10/2015  CLINICAL DATA:  Shortness of breath EXAM: CHEST  2 VIEW COMPARISON:  11/11/2015 FINDINGS: Bilateral small pleural effusion, larger on the left. Dual-chamber pacer from the left, leads in  unremarkable position. Stable mild cardiomegaly. There is bibasilar atelectasis or pneumonia. Apical emphysema. Egg shell calcified thoracic inlet mass correlates with multi nodular goiter in 2012 CTA. IMPRESSION: 1. Bibasilar atelectasis or pneumonia. 2. Small bilateral pleural effusion. 3. COPD. Electronically Signed   By: Monte Fantasia M.D.   On: 12/10/2015 13:44   Nm Myocar Multi W/spect W/wall Motion / Ef  11/12/2015  CLINICAL DATA:  Chest pain EXAM: MYOCARDIAL IMAGING WITH SPECT (REST AND PHARMACOLOGIC-STRESS) GATED LEFT VENTRICULAR WALL MOTION STUDY LEFT VENTRICULAR EJECTION FRACTION TECHNIQUE: Standard myocardial SPECT imaging was performed after resting intravenous injection of 10 mCi Tc-68m sestamibi. Subsequently, intravenous infusion of Lexiscan was performed under the supervision of the Cardiology staff. At peak effect of the drug, 30 mCi Tc-47m sestamibi was injected intravenously and standard myocardial SPECT imaging was  performed. Quantitative gated imaging was also performed to evaluate left ventricular wall motion, and estimate left ventricular ejection fraction. COMPARISON:  None. FINDINGS: Perfusion: There is a large fixed perfusion defect involving the septum, inferoseptal region, in the inferior wall as well as the apex. The defect is continuous and very large. No stress-induced ischemia. Wall Motion: There is dyskinesis of the septum and apex. Hypokinesis of the inferior wall. A relatively normal motion of the anterior and lateral walls. Left Ventricular Ejection Fraction: 31 % End diastolic volume 90 ml End systolic volume 63 ml IMPRESSION: 1. Large fixed defect involving the apex, septum, and inferior walls. No stress-induced perfusion defect. 2. There is dyskinesis of the septum and apex. 3. Left ventricular ejection fraction 31% 4. High-risk stress test findings*. *2012 Appropriate Use Criteria for Coronary Revascularization Focused Update: J Am Coll Cardiol. [id number]. http://content.airportbarriers.com.aspx?articleid=[email or web address] Electronically Signed   By: Marybelle Killings M.D.   On: 11/12/2015 16:36    ASSESSMENT AND PLAN  Yolanda Spears is a 81 y.o. female with a history of PAF on coumadin and amiodarone, HTN, CVA, CKD, SSS s/p pacer, and mod AS who presented to [facility] on 12/10/15 with SOB and found to be in afib and acute CHF.   Acute on chronic systolic CHF: acute exacerbation related to PAF, low EF and AS.Still with crackles on exam. Continue IV lasix. Incontinent of urine so I/Os inaccurate. Cr stable. Continue hydralazine and nitrates and BB.   PAF: Rx on coumadin for CHADSVASC score of 7 (CHF, HTN, Age, F sex, CVA) Continue amiodarone 200mg  daily. TSHpending. Device interrogated yesterday which showed that her underlying rhythm is currently atrial fibrillation, which she has been in since mid November 2016. AFib burden 62.5% since last interrogation in 06/2015. Plan is for TEE/DCCV tomorrow at 12  pm. We have to proceed with TEE since INR was subtherputic x1 in past 3 weeks. NPO after midnight and RN will arrange for [facility].   SSS s/p MDT PPM: Device functioning normally.  Aortic stenosis: mod by 2D ECHO 11/10/15.   Elevated troponin: this was also noted on previous admission in 11/2015. Her EF was down on Echo with anteroseptal and apical akinesis. This was concerning for NSTEMI. Inpatient nuclear stress test was done and this was high risk with apical, septal and inferior scar. Med Rx was recommended given her CKD. She was tx with a combination of beta-blocker, hydralazine, nitrates. She was not put on ASA due to coumadin use. She has had no chest pain.  Jenkins Rouge

## 2015-12-13 ENCOUNTER — Inpatient Hospital Stay (HOSPITAL_COMMUNITY): Payer: Medicare Other

## 2015-12-13 ENCOUNTER — Other Ambulatory Visit: Payer: Self-pay

## 2015-12-13 ENCOUNTER — Inpatient Hospital Stay (HOSPITAL_COMMUNITY): Payer: Medicare Other | Admitting: Certified Registered Nurse Anesthetist

## 2015-12-13 ENCOUNTER — Encounter (HOSPITAL_COMMUNITY): Payer: Self-pay | Admitting: *Deleted

## 2015-12-13 ENCOUNTER — Encounter (HOSPITAL_COMMUNITY)
Admission: EM | Disposition: A | Discharge status: Home Health | Payer: Self-pay | Source: Home / Self Care | Attending: Internal Medicine

## 2015-12-13 DIAGNOSIS — I4819 Other persistent atrial fibrillation: Secondary | ICD-10-CM | POA: Insufficient documentation

## 2015-12-13 DIAGNOSIS — I5021 Acute systolic (congestive) heart failure: Secondary | ICD-10-CM

## 2015-12-13 DIAGNOSIS — Z950 Presence of cardiac pacemaker: Secondary | ICD-10-CM

## 2015-12-13 DIAGNOSIS — I481 Persistent atrial fibrillation: Secondary | ICD-10-CM

## 2015-12-13 DIAGNOSIS — I4891 Unspecified atrial fibrillation: Secondary | ICD-10-CM

## 2015-12-13 HISTORY — PX: CARDIOVERSION: SHX1299

## 2015-12-13 HISTORY — PX: TEE WITHOUT CARDIOVERSION: SHX5443

## 2015-12-13 LAB — CBC
HEMATOCRIT: 32.5 % — AB (ref 36.0–46.0)
HEMOGLOBIN: 10.1 g/dL — AB (ref 12.0–15.0)
MCH: 29.6 pg (ref 26.0–34.0)
MCHC: 31.1 g/dL (ref 30.0–36.0)
MCV: 95.3 fL (ref 78.0–100.0)
Platelets: 144 10*3/uL — ABNORMAL LOW (ref 150–400)
RBC: 3.41 MIL/uL — AB (ref 3.87–5.11)
RDW: 15.1 % (ref 11.5–15.5)
WBC: 5.2 10*3/uL (ref 4.0–10.5)

## 2015-12-13 LAB — PROTIME-INR
INR: 2.54 — AB (ref 0.00–1.49)
Prothrombin Time: 26.2 seconds — ABNORMAL HIGH (ref 11.6–15.2)

## 2015-12-13 LAB — BASIC METABOLIC PANEL
ANION GAP: 10 (ref 5–15)
BUN: 30 mg/dL — AB (ref 6–20)
CALCIUM: 8.6 mg/dL — AB (ref 8.9–10.3)
CO2: 31 mmol/L (ref 22–32)
Chloride: 101 mmol/L (ref 101–111)
Creatinine, Ser: 1.8 mg/dL — ABNORMAL HIGH (ref 0.44–1.00)
GFR calc Af Amer: 30 mL/min — ABNORMAL LOW (ref >60)
GFR, EST NON AFRICAN AMERICAN: 26 mL/min — AB (ref >60)
GLUCOSE: 94 mg/dL (ref 65–99)
Potassium: 4 mmol/L (ref 3.5–5.1)
Sodium: 142 mmol/L (ref 135–145)

## 2015-12-13 SURGERY — ECHOCARDIOGRAM, TRANSESOPHAGEAL
Anesthesia: Monitor Anesthesia Care

## 2015-12-13 MED ORDER — WARFARIN SODIUM 2.5 MG PO TABS
2.5000 mg | ORAL_TABLET | Freq: Once | ORAL | Status: AC
  Administered 2015-12-13: 2.5 mg via ORAL
  Filled 2015-12-13: qty 1

## 2015-12-13 MED ORDER — LIDOCAINE HCL (CARDIAC) 20 MG/ML IV SOLN
INTRAVENOUS | Status: DC | PRN
  Administered 2015-12-13: 80 mg via INTRAVENOUS

## 2015-12-13 MED ORDER — PROPOFOL 500 MG/50ML IV EMUL
INTRAVENOUS | Status: DC | PRN
Start: 2015-12-13 — End: 2015-12-13
  Administered 2015-12-13: 50 ug/kg/min via INTRAVENOUS

## 2015-12-13 MED ORDER — PROPOFOL 10 MG/ML IV BOLUS
INTRAVENOUS | Status: DC | PRN
  Administered 2015-12-13: 20 mg via INTRAVENOUS

## 2015-12-13 NOTE — Progress Notes (Signed)
[facility] PROGRESS NOTE Interim History: 81 y.o. female with a Past Medical History of chronic systolic heart failure, history of atrial fibrillation on Coumadin, history of tachybradycardia syndrome is s/p permanent pacemaker implantation who presents today with the above-noted complaints. Per patient she was in her usual state of health yesterday, and when to sleep in a usual manner. She woke up around 3:00 this morning short of breath. She denies any exertional dyspnea. She denies PND, but has a chronic 4 pillow orthopnea at baseline. After an hour or so, she went back to sleep, but woke up later in the morning with continued shortness of breath. She was seen by her home health RN with device her to come to the emergency room. In the ED, she was felt to have congestive heart failure, and given 120 mg of IV Lasix. By the time I saw the patient she feels significantly better than her initial presentation. She denies any fever, cough, chest pain, nausea, vomiting or abdominal pain.   Filed Weights   12/11/15 0720 12/12/15 0507 12/13/15 0654  Weight: 67.994 kg (149 lb 14.4 oz) 67 kg (147 lb 11.3 oz) 67.1 kg (147 lb 14.9 oz)        Intake/Output Summary (Last 24 hours) at 12/13/15 1906 Last data filed at 12/13/15 1837  Gross per 24 hour  Intake    480 ml  Output      0 ml  Net    480 ml     Assessment/Plan: Acute on Chronic systolic CHF (congestive heart failure) ([place]): -  appears to be secondary to exacerbation of PAF. Patient also with AS  - Continue with IV diuresis, managed by cardiology , strict intake/output and low sodium diet -s/p TEE/cardioversion today (4/7) - Patient with elevated troponin, cardiology input appreciated -overall improved and with stable breathing now  Aortic stenosis: - mod by 2D ECHO 11/10/15.  - continue medical management   CKD (chronic kidney disease) stage 4, GFR 15-29 ml/min (HCC) -stable overall -will follow trend, especially with diuresis    Paroxysmal atrial fibrillation (HCC) -S/P Pacemaker- Dual-chamber-mdt -per cardiology continue metoprolol and also amiodarone -on coumadin for CHADSVASC score of 7 (CHF, HTN, Age, F sex, CVA) -s/p cardioversion today 4/7; will follow cardiology rec's. Currently SR  supra-therapeutic INR: -continue coumadin, dose per pharmacy   physical deconditioning:  - plan is to pursuit home health physical therapy at discharge    SSS s/p MDT PPM:  - Device functioning normally.  Code Status: DNR Family Communication: no family at bedside Disposition Plan: continue inpatient, telemetry bed. Patient on IV lasix, amiodarone and coumadin   Consultants:  Cardiology   Procedures: ECHO: TEE and cardioversion planned for 12/13/15 ([facility])  Antibiotics:  None   HPI/Subjective: Afebrile, feeling weak and tired. Denies CP, nausea, vomiting and palpitations. Reports SOB improved. S/p cardioversion this morning.  Objective: Filed Vitals:   12/13/15 1319 12/13/15 1320 12/13/15 1330 12/13/15 1410  BP: 127/50 127/50 135/61 138/51  Pulse: 61 61 59   Temp:  97.9 F (36.6 C)  97.7 F (36.5 C)  TempSrc:  Oral  Oral  Resp: 19 19 18 18   Height:      Weight:      SpO2: 96% 96% 93% 98%     Exam:  General: Alert, awake, oriented x3, denies CP and palpitations HEENT: No bruits, no goiter; positive mild JVD on exam Heart: rate controlled; no rubs or gallops; positive murmur; patient has trace edema bilaterally  Lungs:  improved air movement, no wheezing; decrease BS at bases, no frank crackles   Abdomen: Soft, nontender, nondistended, positive bowel sounds.  Neuro: Grossly intact, nonfocal.   Data Reviewed: Basic Metabolic Panel:  Recent Labs Lab 12/10/15 1313 12/11/15 0452 12/12/15 0510 12/13/15 0454  NA 143 141 141 142  K 3.5 3.6 4.1 4.0  CL 99* 99* 99* 101  CO2 33* 33* 32 31  GLUCOSE 119* 92 93 94  BUN 30* 30* 29* 30*  CREATININE 1.87* 1.85* 1.84* 1.80*  CALCIUM 9.1 8.5* 9.1  8.6*   Liver Function Tests:  Recent Labs Lab 12/10/15 1313  AST 22  ALT 16  ALKPHOS 89  BILITOT 0.9  PROT 6.9  ALBUMIN 3.2*   CBC:  Recent Labs Lab 12/10/15 1313 12/13/15 0454  WBC 8.0 5.2  NEUTROABS 6.4  --   HGB 11.1* 10.1*  HCT 35.6* 32.5*  MCV 93.4 95.3  PLT 162 144*   Cardiac Enzymes:  Recent Labs Lab 12/10/15 1313  TROPONINI 0.06*   BNP (last 3 results)  Recent Labs  11/08/15 1356 12/10/15 1313  BNP 707.6* 547.5*   Studies: No results found.  Scheduled Meds: . amiodarone  200 mg Oral Q breakfast  . antiseptic oral rinse  7 mL Mouth Rinse q12n4p  . chlorhexidine  15 mL Mouth Rinse BID  . cholecalciferol  5,000 Units Oral Once per day on Sun Tue Thu Sat  . ezetimibe  5 mg Oral Daily  . furosemide  40 mg Intravenous Q12H  . hydrALAZINE  50 mg Oral BID  . isosorbide mononitrate  60 mg Oral Daily  . metoprolol succinate  25 mg Oral BID  . potassium chloride  20 mEq Oral BID  . pregabalin  75 mg Oral BID  . sodium chloride flush  3 mL Intravenous Q12H  . Warfarin - Pharmacist Dosing Inpatient   Does not apply q1800   Continuous Infusions:   Time spent: 30 minutes  Barton Dubois  [facility] Pager [phone]. If 7PM-7AM, please contact night-coverage at [email or web address], password [facility] 12/13/2015, 7:06 PM  LOS: 3 days

## 2015-12-13 NOTE — Transfer of Care (Signed)
Immediate Anesthesia Transfer of Care Note  Patient: Yolanda Spears  Procedure(s) Performed: Procedure(s): TRANSESOPHAGEAL ECHOCARDIOGRAM (TEE) (N/A) CARDIOVERSION (N/A)  Patient Location: Endoscopy Unit  Anesthesia Type:MAC  Level of Consciousness: sedated and patient cooperative  Airway & Oxygen Therapy: Patient Spontanous Breathing and Patient connected to nasal cannula oxygen  Post-op Assessment: Report given to RN and Post -op Vital signs reviewed and stable  Post vital signs: Reviewed  Last Vitals:  Filed Vitals:   12/12/15 2200 12/13/15 1112  BP:  163/55  Pulse: 67   Temp:    Resp:  19    Complications: No apparent anesthesia complications

## 2015-12-13 NOTE — H&P (View-Only) (Signed)
Patient ID: Yolanda Spears, female   DOB: Oct 31, 1934, 81 y.o.   MRN: [id number]   Patient Name: Yolanda Spears Date of Encounter: 12/13/2015     Active Problems:   Pacemaker- Dual-chamber-mdt   Paroxysmal atrial fibrillation (HCC)   CKD (chronic kidney disease) stage 4, GFR 15-29 ml/min (HCC)   Aortic stenosis   Acute systolic CHF (congestive heart failure) (HCC)    SUBJECTIVE  No complaints TEE [facility] today   CURRENT MEDS . amiodarone  200 mg Oral Q breakfast  . antiseptic oral rinse  7 mL Mouth Rinse q12n4p  . chlorhexidine  15 mL Mouth Rinse BID  . cholecalciferol  5,000 Units Oral Once per day on Sun Tue Thu Sat  . ezetimibe  5 mg Oral Daily  . furosemide  40 mg Intravenous Q12H  . hydrALAZINE  50 mg Oral BID  . isosorbide mononitrate  60 mg Oral Daily  . metoprolol succinate  25 mg Oral BID  . potassium chloride  20 mEq Oral BID  . pregabalin  75 mg Oral BID  . sodium chloride flush  3 mL Intravenous Q12H  . Warfarin - Pharmacist Dosing Inpatient   Does not apply q1800    OBJECTIVE  Filed Vitals:   12/12/15 1337 12/12/15 2159 12/12/15 2200 12/13/15 0654  BP: 101/41 131/57    Pulse: 110 59 67   Temp: 98.1 F (36.7 C) 97.9 F (36.6 C)    TempSrc: Oral Oral    Resp: 18 18    Height:      Weight:    67.1 kg (147 lb 14.9 oz)  SpO2: 99% 94%      Intake/Output Summary (Last 24 hours) at 12/13/15 0743 Last data filed at 12/12/15 1204  Gross per 24 hour  Intake    363 ml  Output      0 ml  Net    363 ml   Filed Weights   12/11/15 0720 12/12/15 0507 12/13/15 0654  Weight: 67.994 kg (149 lb 14.4 oz) 67 kg (147 lb 11.3 oz) 67.1 kg (147 lb 14.9 oz)    PHYSICAL EXAM  General: Pleasant, NAD.  Pale  Neuro: Alert and oriented X 3. Moves all extremities spontaneously. Psych: Normal affect. HEENT:  Normal  Neck: Supple without bruits or JVD. Lungs:  Resp regular and unlabored, crackles at bases. Heart: RRR no s3, s4, or + SEM at RUSB. AS  Abdomen: Soft,  non-tender, non-distended, BS + x 4.  Extremities: No clubbing, cyanosis or 1+ LE edema. DP/PT/Radials 2+ and equal bilaterally.  Accessory Clinical Findings  CBC  Recent Labs  12/10/15 1313 12/13/15 0454  WBC 8.0 5.2  NEUTROABS 6.4  --   HGB 11.1* 10.1*  HCT 35.6* 32.5*  MCV 93.4 95.3  PLT 162 123456*   Basic Metabolic Panel  Recent Labs  12/12/15 0510 12/13/15 0454  NA 141 142  K 4.1 4.0  CL 99* 101  CO2 32 31  GLUCOSE 93 94  BUN 29* 30*  CREATININE 1.84* 1.80*  CALCIUM 9.1 8.6*   Liver Function Tests  Recent Labs  12/10/15 1313  AST 22  ALT 16  ALKPHOS 89  BILITOT 0.9  PROT 6.9  ALBUMIN 3.2*   Cardiac Enzymes  Recent Labs  12/10/15 1313  TROPONINI 0.06*    TELE  Paced 12/13/2015   Radiology/Studies  Dg Chest 2 View  12/10/2015  CLINICAL DATA:  Shortness of breath EXAM: CHEST  2 VIEW COMPARISON:  11/11/2015 FINDINGS: Bilateral  small pleural effusion, larger on the left. Dual-chamber pacer from the left, leads in unremarkable position. Stable mild cardiomegaly. There is bibasilar atelectasis or pneumonia. Apical emphysema. Egg shell calcified thoracic inlet mass correlates with multi nodular goiter in 2012 CTA. IMPRESSION: 1. Bibasilar atelectasis or pneumonia. 2. Small bilateral pleural effusion. 3. COPD. Electronically Signed   By: Monte Fantasia M.D.   On: 12/10/2015 13:44   . amiodarone  200 mg Oral Q breakfast  . antiseptic oral rinse  7 mL Mouth Rinse q12n4p  . chlorhexidine  15 mL Mouth Rinse BID  . cholecalciferol  5,000 Units Oral Once per day on Sun Tue Thu Sat  . ezetimibe  5 mg Oral Daily  . furosemide  40 mg Intravenous Q12H  . hydrALAZINE  50 mg Oral BID  . isosorbide mononitrate  60 mg Oral Daily  . metoprolol succinate  25 mg Oral BID  . potassium chloride  20 mEq Oral BID  . pregabalin  75 mg Oral BID  . sodium chloride flush  3 mL Intravenous Q12H  . Warfarin - Pharmacist Dosing Inpatient   Does not apply q1800   ASSESSMENT  AND PLAN  Yolanda Spears is a 81 y.o. female with a history of PAF on coumadin and amiodarone, HTN, CVA, CKD, SSS s/p pacer, and mod AS who presented to [facility] on 12/10/15 with SOB and found to be in afib and acute CHF.   Acute on chronic systolic CHF: acute exacerbation related to PAF, low EF and AS.Still with crackles on exam. Continue IV lasix. Incontinent of urine so I/Os inaccurate. Cr stable. Continue hydralazine and nitrates and BB.   PAF: Rx on coumadin for CHADSVASC score of 7 (CHF, HTN, Age, F sex, CVA) Continue amiodarone 200mg  daily. TSHpending. Device interrogated yesterday which showed that her underlying rhythm is currently atrial fibrillation, which she has been in since mid November 2016. AFib burden 62.5% since last interrogation in 06/2015. Plan is for TEE/DCCV today  at 12 pm. We have to proceed with TEE since INR was subtherputic x1 in past 3 weeks. INR Rx this am  Lab Results  Component Value Date   INR 2.54* 12/13/2015   INR 3.72* 12/12/2015   INR 4.66* 12/11/2015     SSS s/p MDT PPM: Device functioning normally.  Aortic stenosis: mod by 2D ECHO 11/10/15.   Elevated troponin: this was also noted on previous admission in 11/2015. Her EF was down on Echo with anteroseptal and apical akinesis. This was concerning for NSTEMI. Inpatient nuclear stress test was done and this was high risk with apical, septal and inferior scar. Med Rx was recommended given her CKD. She was tx with a combination of beta-blocker, hydralazine, nitrates. She was not put on ASA due to coumadin use. She has had no chest pain.  Yolanda Spears

## 2015-12-13 NOTE — Anesthesia Postprocedure Evaluation (Signed)
Anesthesia Post Note  Patient: Yolanda Spears  Procedure(s) Performed: Procedure(s) (LRB): TRANSESOPHAGEAL ECHOCARDIOGRAM (TEE) (N/A) CARDIOVERSION (N/A)  Patient location during evaluation: PACU Anesthesia Type: General Level of consciousness: awake and alert Pain management: pain level controlled Vital Signs Assessment: post-procedure vital signs reviewed and stable Respiratory status: spontaneous breathing, nonlabored ventilation, respiratory function stable and patient connected to nasal cannula oxygen Cardiovascular status: blood pressure returned to baseline and stable Postop Assessment: no signs of nausea or vomiting Anesthetic complications: no    Last Vitals:  Filed Vitals:   12/13/15 1330 12/13/15 1410  BP: 135/61 138/51  Pulse: 59   Temp:  36.5 C  Resp: 18 18    Last Pain:  Filed Vitals:   12/13/15 1410  PainSc: 0-No pain                 Ubaldo Daywalt A

## 2015-12-13 NOTE — Progress Notes (Signed)
Patient ID: Yolanda Spears, female   DOB: Oct 06, 1934, 81 y.o.   MRN: [id number]   Patient Name: Yolanda Spears Date of Encounter: 12/13/2015     Active Problems:   Pacemaker- Dual-chamber-mdt   Paroxysmal atrial fibrillation (HCC)   CKD (chronic kidney disease) stage 4, GFR 15-29 ml/min (HCC)   Aortic stenosis   Acute systolic CHF (congestive heart failure) (HCC)    SUBJECTIVE  No complaints TEE [facility] today   CURRENT MEDS . amiodarone  200 mg Oral Q breakfast  . antiseptic oral rinse  7 mL Mouth Rinse q12n4p  . chlorhexidine  15 mL Mouth Rinse BID  . cholecalciferol  5,000 Units Oral Once per day on Sun Tue Thu Sat  . ezetimibe  5 mg Oral Daily  . furosemide  40 mg Intravenous Q12H  . hydrALAZINE  50 mg Oral BID  . isosorbide mononitrate  60 mg Oral Daily  . metoprolol succinate  25 mg Oral BID  . potassium chloride  20 mEq Oral BID  . pregabalin  75 mg Oral BID  . sodium chloride flush  3 mL Intravenous Q12H  . Warfarin - Pharmacist Dosing Inpatient   Does not apply q1800    OBJECTIVE  Filed Vitals:   12/12/15 1337 12/12/15 2159 12/12/15 2200 12/13/15 0654  BP: 101/41 131/57    Pulse: 110 59 67   Temp: 98.1 F (36.7 C) 97.9 F (36.6 C)    TempSrc: Oral Oral    Resp: 18 18    Height:      Weight:    67.1 kg (147 lb 14.9 oz)  SpO2: 99% 94%      Intake/Output Summary (Last 24 hours) at 12/13/15 0743 Last data filed at 12/12/15 1204  Gross per 24 hour  Intake    363 ml  Output      0 ml  Net    363 ml   Filed Weights   12/11/15 0720 12/12/15 0507 12/13/15 0654  Weight: 67.994 kg (149 lb 14.4 oz) 67 kg (147 lb 11.3 oz) 67.1 kg (147 lb 14.9 oz)    PHYSICAL EXAM  General: Pleasant, NAD.  Pale  Neuro: Alert and oriented X 3. Moves all extremities spontaneously. Psych: Normal affect. HEENT:  Normal  Neck: Supple without bruits or JVD. Lungs:  Resp regular and unlabored, crackles at bases. Heart: RRR no s3, s4, or + SEM at RUSB. AS  Abdomen: Soft,  non-tender, non-distended, BS + x 4.  Extremities: No clubbing, cyanosis or 1+ LE edema. DP/PT/Radials 2+ and equal bilaterally.  Accessory Clinical Findings  CBC  Recent Labs  12/10/15 1313 12/13/15 0454  WBC 8.0 5.2  NEUTROABS 6.4  --   HGB 11.1* 10.1*  HCT 35.6* 32.5*  MCV 93.4 95.3  PLT 162 123456*   Basic Metabolic Panel  Recent Labs  12/12/15 0510 12/13/15 0454  NA 141 142  K 4.1 4.0  CL 99* 101  CO2 32 31  GLUCOSE 93 94  BUN 29* 30*  CREATININE 1.84* 1.80*  CALCIUM 9.1 8.6*   Liver Function Tests  Recent Labs  12/10/15 1313  AST 22  ALT 16  ALKPHOS 89  BILITOT 0.9  PROT 6.9  ALBUMIN 3.2*   Cardiac Enzymes  Recent Labs  12/10/15 1313  TROPONINI 0.06*    TELE  Paced 12/13/2015   Radiology/Studies  Dg Chest 2 View  12/10/2015  CLINICAL DATA:  Shortness of breath EXAM: CHEST  2 VIEW COMPARISON:  11/11/2015 FINDINGS: Bilateral  small pleural effusion, larger on the left. Dual-chamber pacer from the left, leads in unremarkable position. Stable mild cardiomegaly. There is bibasilar atelectasis or pneumonia. Apical emphysema. Egg shell calcified thoracic inlet mass correlates with multi nodular goiter in 2012 CTA. IMPRESSION: 1. Bibasilar atelectasis or pneumonia. 2. Small bilateral pleural effusion. 3. COPD. Electronically Signed   By: Monte Fantasia M.D.   On: 12/10/2015 13:44   . amiodarone  200 mg Oral Q breakfast  . antiseptic oral rinse  7 mL Mouth Rinse q12n4p  . chlorhexidine  15 mL Mouth Rinse BID  . cholecalciferol  5,000 Units Oral Once per day on Sun Tue Thu Sat  . ezetimibe  5 mg Oral Daily  . furosemide  40 mg Intravenous Q12H  . hydrALAZINE  50 mg Oral BID  . isosorbide mononitrate  60 mg Oral Daily  . metoprolol succinate  25 mg Oral BID  . potassium chloride  20 mEq Oral BID  . pregabalin  75 mg Oral BID  . sodium chloride flush  3 mL Intravenous Q12H  . Warfarin - Pharmacist Dosing Inpatient   Does not apply q1800   ASSESSMENT  AND PLAN  Yolanda Spears is a 81 y.o. female with a history of PAF on coumadin and amiodarone, HTN, CVA, CKD, SSS s/p pacer, and mod AS who presented to [facility] on 12/10/15 with SOB and found to be in afib and acute CHF.   Acute on chronic systolic CHF: acute exacerbation related to PAF, low EF and AS.Still with crackles on exam. Continue IV lasix. Incontinent of urine so I/Os inaccurate. Cr stable. Continue hydralazine and nitrates and BB.   PAF: Rx on coumadin for CHADSVASC score of 7 (CHF, HTN, Age, F sex, CVA) Continue amiodarone 200mg  daily. TSHpending. Device interrogated yesterday which showed that her underlying rhythm is currently atrial fibrillation, which she has been in since mid November 2016. AFib burden 62.5% since last interrogation in 06/2015. Plan is for TEE/DCCV today  at 12 pm. We have to proceed with TEE since INR was subtherputic x1 in past 3 weeks. INR Rx this am  Lab Results  Component Value Date   INR 2.54* 12/13/2015   INR 3.72* 12/12/2015   INR 4.66* 12/11/2015     SSS s/p MDT PPM: Device functioning normally.  Aortic stenosis: mod by 2D ECHO 11/10/15.   Elevated troponin: this was also noted on previous admission in 11/2015. Her EF was down on Echo with anteroseptal and apical akinesis. This was concerning for NSTEMI. Inpatient nuclear stress test was done and this was high risk with apical, septal and inferior scar. Med Rx was recommended given her CKD. She was tx with a combination of beta-blocker, hydralazine, nitrates. She was not put on ASA due to coumadin use. She has had no chest pain.  Jenkins Rouge

## 2015-12-13 NOTE — Op Note (Signed)
Pt anesthetized with propofol by anesthesia With pads in AP position, cardioversion attempted with 150 J synchronized biphasic energy  Unsuccessful  Note short failure to capture with pacer. Pads readjusted in AP postion.  With 200 J synchronized biphasic energy and chest pressure cardioversion attempted without success New pads placed at apex and base cardioversion attempted with 200 J synchronized biphasic energy with 1 beat SR  Reattempted with another 200 J synchronized biphasic energy with conversion to SR.  Pacer interrogation being performed.

## 2015-12-13 NOTE — Anesthesia Preprocedure Evaluation (Signed)
Anesthesia Evaluation  Patient identified by MRN, date of birth, ID band Patient awake    Reviewed: Allergy & Precautions, NPO status , Patient's Chart, lab work & pertinent test results  Airway Mallampati: II   Neck ROM: full    Dental   Pulmonary sleep apnea , former smoker,    breath sounds clear to auscultation       Cardiovascular hypertension, +CHF  + dysrhythmias + pacemaker  Rhythm:regular Rate:Normal     Neuro/Psych TIA Neuromuscular disease CVA    GI/Hepatic   Endo/Other  Hyperthyroidism   Renal/GU      Musculoskeletal  (+) Arthritis ,   Abdominal   Peds  Hematology   Anesthesia Other Findings   Reproductive/Obstetrics                             Anesthesia Physical Anesthesia Plan  ASA: III  Anesthesia Plan: MAC   Post-op Pain Management:    Induction: Intravenous  Airway Management Planned: Nasal Cannula  Additional Equipment:   Intra-op Plan:   Post-operative Plan:   Informed Consent: I have reviewed the patients History and Physical, chart, labs and discussed the procedure including the risks, benefits and alternatives for the proposed anesthesia with the patient or authorized representative who has indicated his/her understanding and acceptance.     Plan Discussed with: CRNA, Anesthesiologist and Surgeon  Anesthesia Plan Comments:         Anesthesia Quick Evaluation

## 2015-12-13 NOTE — Op Note (Signed)
LA, LAA without masses LVEF is normal Mild fixed plaque of the thoracic aorta TV normal  Mild TR MV is normal   AV is trileaflet with restricted motion   There appears to be moderately severe to severe AS MIld AI PV is normal  Trace TR Mild fixed plaque in the thoracic aorta    Full report to follow

## 2015-12-13 NOTE — Care Management Important Message (Signed)
Important Message  Patient Details  Name: Yolanda Spears MRN: [id number] Date of Birth: 1934-10-22   Medicare Important Message Given:  Yes    Camillo Flaming 12/13/2015, 10:50 AMImportant Message  Patient Details  Name: Yolanda Spears MRN: [id number] Date of Birth: 05-08-1935   Medicare Important Message Given:  Yes    Camillo Flaming 12/13/2015, 10:50 AM

## 2015-12-13 NOTE — Patient Outreach (Signed)
Chart review for disposition. Patient remains hospitalized in the acute care setting.

## 2015-12-13 NOTE — Progress Notes (Signed)
[place] for Warfarin Indication: atrial fibrillation  Allergies  Allergen Reactions  . Statins Other (See Comments)    Myalgias,elevated LFT studies  . Shellfish-Derived Products Nausea And Vomiting  . Contrast Media [Iodinated Diagnostic Agents] Hives  . Iohexol Hives and Itching    Itching in eyes, one hive on face.  Marland Kitchen Penicillins Itching    Has patient had a PCN reaction causing immediate rash, facial/tongue/throat swelling, SOB or lightheadedness with hypotension: unknown Has patient had a PCN reaction causing severe rash involving mucus membranes or skin necrosis: No Has patient had a PCN reaction that required hospitalization No Has patient had a PCN reaction occurring within the last 10 years: No If all of the above answers are "NO", then may proceed with Cephalosporin use.    Patient Measurements: Height: 5\' 7"  (170.2 cm) Weight: 147 lb 14.9 oz (67.1 kg) IBW/kg (Calculated) : 61.6  Vital Signs:    Labs:  Recent Labs  12/10/15 1313 12/11/15 0452 12/12/15 0510 12/13/15 0454  HGB 11.1*  --   --  10.1*  HCT 35.6*  --   --  32.5*  PLT 162  --   --  144*  LABPROT 37.0* 41.4* 34.9* 26.2*  INR 4.02* 4.66* 3.72* 2.54*  CREATININE 1.87* 1.85* 1.84* 1.80*  TROPONINI 0.06*  --   --   --    Estimated Creatinine Clearance: 25 mL/min (by C-G formula based on Cr of 1.8).  Medications:  Scheduled:  . amiodarone  200 mg Oral Q breakfast  . antiseptic oral rinse  7 mL Mouth Rinse q12n4p  . chlorhexidine  15 mL Mouth Rinse BID  . cholecalciferol  5,000 Units Oral Once per day on Sun Tue Thu Sat  . ezetimibe  5 mg Oral Daily  . furosemide  40 mg Intravenous Q12H  . hydrALAZINE  50 mg Oral BID  . isosorbide mononitrate  60 mg Oral Daily  . metoprolol succinate  25 mg Oral BID  . potassium chloride  20 mEq Oral BID  . pregabalin  75 mg Oral BID  . sodium chloride flush  3 mL Intravenous Q12H  . Warfarin - Pharmacist Dosing Inpatient    Does not apply q1800   Assessment: 81 yr female with PMH including CHF, AFB, tachybradycardia syndrome, pacemaker.  On warfarin PTA for anticoagulation.  Presents with shortness of breath.  Patient to be admitted for acute CHF.   Home regimen of warfarin = 1.25mg  daily except 2.5mg  on Wed, with last reported dose taken on 4/3 @ 20:00  INR upon admission (4/4) = 4.02  Today, 12/13/2015:   INR decreased into desired range (2.54) (suspect decompensated HF for elevated INR)  CBC: 4/7 slightly decreased Hgb (10.1), pltc decr to 144  No major drug-drug interactions (amio is home med)  Goal of Therapy:  INR 2-3   Plan:   Plan TEE & DC Cardioversion today  Warfarin 2.5mg  today at 1800  Check daily INR   Minda Ditto PharmD Pager [phone] 12/13/2015, 10:34 AM

## 2015-12-13 NOTE — Progress Notes (Signed)
*  PRELIMINARY RESULTS* Echocardiogram Echocardiogram Transesophageal has been performed.  [name] 12/13/2015, 2:30 PM

## 2015-12-13 NOTE — Interval H&P Note (Signed)
History and Physical Interval Note:  12/13/2015 8:09 AM  Yolanda Spears  has presented today for surgery, with the diagnosis of a fib  The various methods of treatment have been discussed with the patient and family. After consideration of risks, benefits and other options for treatment, the patient has consented to  Procedure(s): TRANSESOPHAGEAL ECHOCARDIOGRAM (TEE) (N/A) CARDIOVERSION (N/A) as a surgical intervention .  The patient's history has been reviewed, patient examined, no change in status, stable for surgery.  I have reviewed the patient's chart and labs.  Questions were answered to the patient's satisfaction.     Dorris Carnes

## 2015-12-14 DIAGNOSIS — I248 Other forms of acute ischemic heart disease: Secondary | ICD-10-CM

## 2015-12-14 DIAGNOSIS — I2510 Atherosclerotic heart disease of native coronary artery without angina pectoris: Secondary | ICD-10-CM

## 2015-12-14 DIAGNOSIS — I5031 Acute diastolic (congestive) heart failure: Secondary | ICD-10-CM

## 2015-12-14 DIAGNOSIS — I4891 Unspecified atrial fibrillation: Secondary | ICD-10-CM

## 2015-12-14 DIAGNOSIS — J9621 Acute and chronic respiratory failure with hypoxia: Secondary | ICD-10-CM

## 2015-12-14 DIAGNOSIS — I5043 Acute on chronic combined systolic (congestive) and diastolic (congestive) heart failure: Secondary | ICD-10-CM

## 2015-12-14 LAB — PROTIME-INR
INR: 2.49 — AB (ref 0.00–1.49)
PROTHROMBIN TIME: 25.8 s — AB (ref 11.6–15.2)

## 2015-12-14 MED ORDER — FUROSEMIDE 40 MG PO TABS: 60.0000 mg | ORAL_TABLET | Freq: Two times a day (BID) | ORAL | Status: AC

## 2015-12-14 MED ORDER — WARFARIN SODIUM 2.5 MG PO TABS
2.5000 mg | ORAL_TABLET | Freq: Once | ORAL | Status: DC
  Filled 2015-12-14: qty 1

## 2015-12-14 MED ORDER — POTASSIUM CHLORIDE CRYS ER 10 MEQ PO TBCR: 20.0000 meq | EXTENDED_RELEASE_TABLET | Freq: Every day | ORAL | Status: AC

## 2015-12-14 MED ORDER — FUROSEMIDE 40 MG PO TABS
40.0000 mg | ORAL_TABLET | Freq: Two times a day (BID) | ORAL | Status: DC
  Filled 2015-12-14: qty 1

## 2015-12-14 NOTE — Progress Notes (Signed)
While ambulating to bedside commode on RA, patient 02 saturations dropped to 83%.  Placed back on 2L, sats rose to 93%.

## 2015-12-14 NOTE — Progress Notes (Signed)
[facility] for Warfarin Indication: atrial fibrillation  Allergies  Allergen Reactions  . Statins Other (See Comments)    Myalgias,elevated LFT studies  . Shellfish-Derived Products Nausea And Vomiting  . Contrast Media [Iodinated Diagnostic Agents] Hives  . Iohexol Hives and Itching    Itching in eyes, one hive on face.  Marland Kitchen Penicillins Itching    Has patient had a PCN reaction causing immediate rash, facial/tongue/throat swelling, SOB or lightheadedness with hypotension: unknown Has patient had a PCN reaction causing severe rash involving mucus membranes or skin necrosis: No Has patient had a PCN reaction that required hospitalization No Has patient had a PCN reaction occurring within the last 10 years: No If all of the above answers are "NO", then may proceed with Cephalosporin use.    Patient Measurements: Height: 5\' 7"  (170.2 cm) Weight: 155 lb 3.2 oz (70.398 kg) IBW/kg (Calculated) : 61.6  Vital Signs: Temp: 98.3 F (36.8 C) (04/08 0614) Temp Source: Oral (04/08 0614) BP: 130/65 mmHg (04/08 0910) Pulse Rate: 91 (04/08 0614)  Labs:  Recent Labs  12/12/15 0510 12/13/15 0454 12/14/15 0539  HGB  --  10.1*  --   HCT  --  32.5*  --   PLT  --  144*  --   LABPROT 34.9* 26.2* 25.8*  INR 3.72* 2.54* 2.49*  CREATININE 1.84* 1.80*  --    Estimated Creatinine Clearance: 25 mL/min (by C-G formula based on Cr of 1.8).  Medications:  Scheduled:  . amiodarone  200 mg Oral Q breakfast  . antiseptic oral rinse  7 mL Mouth Rinse q12n4p  . chlorhexidine  15 mL Mouth Rinse BID  . cholecalciferol  5,000 Units Oral Once per day on Sun Tue Thu Sat  . ezetimibe  5 mg Oral Daily  . furosemide  40 mg Intravenous Q12H  . hydrALAZINE  50 mg Oral BID  . isosorbide mononitrate  60 mg Oral Daily  . metoprolol succinate  25 mg Oral BID  . potassium chloride  20 mEq Oral BID  . pregabalin  75 mg Oral BID  . sodium chloride flush  3 mL Intravenous  Q12H  . Warfarin - Pharmacist Dosing Inpatient   Does not apply q1800   Assessment: 81 yr female with PMH including CHF, AFB, tachybradycardia syndrome, pacemaker.  On warfarin PTA for anticoagulation.  Presents with shortness of breath.  Patient to be admitted for acute CHF.   Home regimen of warfarin = 1.25mg  daily except 2.5mg  on Wed, with last reported dose taken on 4/3 @ 20:00  INR upon admission (4/4) = 4.02  Today, 12/14/2015:   INR remains in therapeutic range (2.49) (suspect decompensated HF for elevated INR)  CBC: 4/7 slightly decreased Hgb (10.1), pltc decr to 144  No major drug-drug interactions (amio is home med)  Goal of Therapy:  INR 2-3   Plan:   Warfarin 2.5mg  today at 1800  Daily PT/ INR   CBC in am  Minda Ditto PharmD Pager [phone] 12/14/2015, 10:57 AM

## 2015-12-14 NOTE — Discharge Summary (Signed)
Physician Discharge Summary  Yolanda Spears [id number] DOB: [birth year] DOA: 12/10/2015  PCP: Tawanna Solo, MD  Admit date: 12/10/2015 Discharge date: 12/14/2015  Time spent: 40 minutes  Recommendations for Outpatient Follow-up:  1. Repeat BMET to follow electrolytes and renal failure 2. Please reassess oxygen supplementation needs 3. Reassess BP and adjust medications as needed    Discharge Diagnoses:  Active Problems:   Pacemaker- Dual-chamber-mdt   Paroxysmal atrial fibrillation (HCC)   CKD (chronic kidney disease) stage 4, GFR 15-29 ml/min (HCC)   Aortic stenosis   Acute systolic CHF (congestive heart failure) (HCC)   Persistent atrial fibrillation ([place])   Discharge Condition: stable and improved. Discharge home with [facility] services and oxygen supplementation. Follow up with PCP in 10 days and with cardiology service (office will set up appointment)   Diet recommendation: heart healthy diet  Filed Weights   12/12/15 0507 12/13/15 0654 12/14/15 [id number]  Weight: 67 kg (147 lb 11.3 oz) 67.1 kg (147 lb 14.9 oz) 70.398 kg (155 lb 3.2 oz)    History of present illness:  As per H&P written on 12/10/15 by Dr. Sloan Leiter 81 y.o. female with a Past Medical History of chronic systolic heart failure, history of atrial fibrillation on Coumadin, history of tachybradycardia syndrome is s/p permanent pacemaker implantation who presents today with the above-noted complaints. Per patient she was in her usual state of health yesterday, and when to sleep in a usual manner. She woke up around 3:00 this morning short of breath. She denies any exertional dyspnea. She denies PND, but has a chronic 4 pillow orthopnea at baseline. After an hour or so, she went back to sleep, but woke up later in the morning with continued shortness of breath. She was seen by her home health RN with device her to come to the emergency room. In the ED, she was felt to have congestive heart failure, and given 120 mg of IV Lasix. By  the time I saw the patient she feels significantly better than her initial presentation. She denies any fever, cough, chest pain, nausea, vomiting or abdominal pain. She is now being admitted for further evaluation and treatment.  Hospital Course:  Acute on Chronic systolic CHF (congestive heart failure) ([place]): -appears to be secondary to exacerbation of PAF. Patient also with AS  -Continue with lasix now PO at 60mg  BID as per cardiology rec's, strict low sodium diet and daily weights -s/p TEE/cardioversion today (4/7) -Patient with elevated troponin (demand ischemia form CHF and A. Fib) cardiology input appreciated -overall improved and with stable breathing now; still requiring oxygen supplementation with exertion. Oxygen for home arranged at discharge  Aortic stenosis: - mod by 2D ECHO 11/10/15.  - continue medical management and outpatient follow up with cardiology  CKD (chronic kidney disease) stage 4, GFR 15-29 ml/min (HCC) -stable overall -will follow trend, especially with adjustment on diuretics   Paroxysmal atrial fibrillation ([place]): with persistent A. fib -S/P Pacemaker- Dual-chamber-mdt -per cardiology continue metoprolol and also amiodarone -on coumadin for CHADSVASC score of 7 (CHF, HTN, Age, F sex, CVA) -s/p cardioversion today 4/7; will follow with cardiology service as an outpatient.  -Currently SR  supra-therapeutic INR: -continue coumadin -INR therapeutic at 2.49 on day of discharge  physical deconditioning:  - plan is to pursuit home health physical therapy at discharge  -[facility] services arranged  SSS s/p MDT PPM:  - Device functioning normally. -currently after cardioversion in SR  Procedures: ECHO: TEE and cardioversion planned for 12/13/15 ([facility])  Consultations:  Cardiology   Discharge Exam: Filed Vitals:   12/14/15 0910 12/14/15 1454  BP: 130/65 113/50  Pulse:  64  Temp:  98.6 F (37 C)  Resp:  18    General: Alert, awake, oriented x3,  denies CP and palpitations. Breathing is stable; but requiring O2 supplementation with exertion. HEENT: No bruits, no goiter; positive mild JVD on exam Heart: rate controlled; no rubs or gallops; positive murmur; patient has trace edema bilaterally  Lungs: improved air movement, no wheezing; decrease BS at bases, no frank crackles  Abdomen: Soft, nontender, nondistended, positive bowel sounds.  Neuro: Grossly intact, nonfocal.  Discharge Instructions   Discharge Instructions    Diet - low sodium heart healthy    Complete by:  As directed      Discharge instructions    Complete by:  As directed   Follow low sodium diet (less than 2 gram daily) Take medications as prescribed Use oxygen as instructed Check weight on daily basis Outpatient follow up with PCP in 10 days Outpatient follow up with cardiology as instructed (office will contact you with appointment details)          Current Discharge Medication List    CONTINUE these medications which have CHANGED   Details  furosemide (LASIX) 40 MG tablet Take 1.5 tablets (60 mg total) by mouth 2 (two) times daily. Qty: 75 tablet, Refills: 1    potassium chloride (KLOR-CON M10) 10 MEQ tablet Take 2 tablets (20 mEq total) by mouth daily. Qty: 60 tablet, Refills: 1      CONTINUE these medications which have NOT CHANGED   Details  amiodarone (PACERONE) 200 MG tablet Take 200 mg by mouth daily with breakfast.     calcium carbonate (TUMS - DOSED IN MG ELEMENTAL CALCIUM) 500 MG chewable tablet Chew 1 tablet by mouth daily with lunch. Reported on 12/10/2015    Cholecalciferol (VITAMIN D-3) 5000 UNITS TABS Take 1 tablet by mouth 4 (four) times a week. Takes on Saturday, Sunday, Tuesday and Thursday    CRANBERRY PO Take 1 tablet by mouth daily with lunch.    diphenoxylate-atropine (LOMOTIL) 2.5-0.025 MG tablet Take 1 tablet by mouth 4 (four) times daily as needed for diarrhea or loose stools.    ezetimibe (ZETIA) 10 MG tablet 1/2  tablet by mouth daily Qty: 45 tablet, Refills: 1    hydrALAZINE (APRESOLINE) 50 MG tablet Take 1 tablet (50 mg total) by mouth 2 (two) times daily. Qty: 60 tablet, Refills: 11    isosorbide mononitrate (IMDUR) 60 MG 24 hr tablet Take 1 tablet (60 mg total) by mouth daily. Qty: 60 tablet, Refills: 11    metoprolol succinate (TOPROL-XL) 25 MG 24 hr tablet Take 1 tablet (25 mg total) by mouth 2 (two) times daily. Qty: 30 tablet, Refills: 11    pregabalin (LYRICA) 75 MG capsule Take 75 mg by mouth 2 (two) times daily.    warfarin (COUMADIN) 2.5 MG tablet Take 1.25-2.5 mg by mouth at bedtime. Take 1.25mg  everyday except Wednesday will take 2.5mg  Qty: 90 tablet, Refills: 3       Allergies  Allergen Reactions  . Statins Other (See Comments)    Myalgias,elevated LFT studies  . Shellfish-Derived Products Nausea And Vomiting  . Contrast Media [Iodinated Diagnostic Agents] Hives  . Iohexol Hives and Itching    Itching in eyes, one hive on face.  Marland Kitchen Penicillins Itching    Has patient had a PCN reaction causing immediate rash, facial/tongue/throat swelling, SOB or lightheadedness with hypotension:  unknown Has patient had a PCN reaction causing severe rash involving mucus membranes or skin necrosis: No Has patient had a PCN reaction that required hospitalization No Has patient had a PCN reaction occurring within the last 10 years: No If all of the above answers are "NO", then may proceed with Cephalosporin use.    Follow-up Information    Follow up with [facility].   Why:  Home Health   Contact information:   [address] [place] [address] [phone]       Follow up with Tawanna Solo, MD. Schedule an appointment as soon as possible for a visit in 10 days.   Specialty:  Family Medicine   Contact information:   [place] [address] [phone]       Call Jenkins Rouge, MD.   Specialty:  Cardiology   Why:  office for appointment  details    Contact information:   [id number] [place] [address] [phone]       The results of significant diagnostics from this hospitalization (including imaging, microbiology, ancillary and laboratory) are listed below for reference.    Significant Diagnostic Studies: Dg Chest 2 View  12/10/2015  CLINICAL DATA:  Shortness of breath EXAM: CHEST  2 VIEW COMPARISON:  11/11/2015 FINDINGS: Bilateral small pleural effusion, larger on the left. Dual-chamber pacer from the left, leads in unremarkable position. Stable mild cardiomegaly. There is bibasilar atelectasis or pneumonia. Apical emphysema. Egg shell calcified thoracic inlet mass correlates with multi nodular goiter in 2012 CTA. IMPRESSION: 1. Bibasilar atelectasis or pneumonia. 2. Small bilateral pleural effusion. 3. COPD. Electronically Signed   By: Monte Fantasia M.D.   On: 12/10/2015 13:44   Labs: Basic Metabolic Panel:  Recent Labs Lab 12/10/15 1313 12/11/15 0452 12/12/15 0510 12/13/15 0454  NA 143 141 141 142  K 3.5 3.6 4.1 4.0  CL 99* 99* 99* 101  CO2 33* 33* 32 31  GLUCOSE 119* 92 93 94  BUN 30* 30* 29* 30*  CREATININE 1.87* 1.85* 1.84* 1.80*  CALCIUM 9.1 8.5* 9.1 8.6*   Liver Function Tests:  Recent Labs Lab 12/10/15 1313  AST 22  ALT 16  ALKPHOS 89  BILITOT 0.9  PROT 6.9  ALBUMIN 3.2*   CBC:  Recent Labs Lab 12/10/15 1313 12/13/15 0454  WBC 8.0 5.2  NEUTROABS 6.4  --   HGB 11.1* 10.1*  HCT 35.6* 32.5*  MCV 93.4 95.3  PLT 162 144*   Cardiac Enzymes:  Recent Labs Lab 12/10/15 1313  TROPONINI 0.06*   BNP: BNP (last 3 results)  Recent Labs  11/08/15 1356 12/10/15 1313  BNP 707.6* 547.5*    Signed:  Barton Dubois MD.  [facility] 12/14/2015, 4:58 PM

## 2015-12-14 NOTE — Care Management Note (Addendum)
Case Management Note  Patient Details  Name: SHAKETHA NOSTRAND MRN: [id number] Date of Birth: [birth year]  Subjective/Objective:                  CHF  Action/Plan: CM spoke with patient. She will be discharged home today on oxygen. Mandy at [facility] notified of oxygen order and will deliver portable oxygen to the patient's room for discharge today. [facility] was providing home health care PT/OT/HHA/RN prior to admission. Message left on Dona's voicemail [facility]) informing of discharge today. Discussed need for [facility] orders with [facility] nurse Katie.   Expected Discharge Date:   (unknown)               Expected Discharge Plan:  [place]  In-House Referral:     Discharge planning Services  CM Consult  Post Acute Care Choice:  Home Health, Resumption of Svcs/PTA Provider (Active w/[facility]HRN/PT/OT/aide) Choice offered to:     DME Arranged:  Oxygen DME Agency:  [facility]  HH Arranged:    HH Agency:  [facility]  Status of Service:  Completed, signed off  Medicare Important Message Given:  Yes Date Medicare IM Given:    Medicare IM give by:    Date Additional Medicare IM Given:    Additional Medicare Important Message give by:     If discussed at [place] of Stay Meetings, dates discussed:    Additional Comments:  Apolonio Schneiders, RN 12/14/2015, 3:17 PM

## 2015-12-14 NOTE — Progress Notes (Signed)
Went over all discharge information with patient and son.  All questions answered.  Prescriptions and discharge summary given.  Portable o2 brought to room.  HH needs set up.  Pt wheeled out by NT.

## 2015-12-14 NOTE — Progress Notes (Signed)
SUBJECTIVE: Feeling better after cardioversion yesterday.      Intake/Output Summary (Last 24 hours) at 12/14/15 1148 Last data filed at 12/13/15 1837  Gross per 24 hour  Intake    480 ml  Output      0 ml  Net    480 ml    Current Facility-Administered Medications  Medication Dose Route Frequency Provider Last Rate Last Dose  . 0.9 %  sodium chloride infusion  250 mL Intravenous PRN Jonetta Osgood, MD      . acetaminophen (TYLENOL) tablet 650 mg  650 mg Oral Q4H PRN Jonetta Osgood, MD      . amiodarone (PACERONE) tablet 200 mg  200 mg Oral Q breakfast Jonetta Osgood, MD   200 mg at 12/14/15 0900  . antiseptic oral rinse (CPC / CETYLPYRIDINIUM CHLORIDE 0.05%) solution 7 mL  7 mL Mouth Rinse q12n4p Jonetta Osgood, MD   7 mL at 12/13/15 1525  . calcium carbonate (TUMS - dosed in mg elemental calcium) chewable tablet 100-200 mg of elemental calcium  0.5-1 tablet Oral Daily PRN Jonetta Osgood, MD      . chlorhexidine (PERIDEX) 0.12 % solution 15 mL  15 mL Mouth Rinse BID Jonetta Osgood, MD   15 mL at 12/13/15 2156  . cholecalciferol (VITAMIN D) tablet 5,000 Units  5,000 Units Oral Once per day on Sun Tue Thu Sat Jonetta Osgood, MD   5,000 Units at 12/14/15 0901  . ezetimibe (ZETIA) tablet 5 mg  5 mg Oral Daily Jonetta Osgood, MD   5 mg at 12/14/15 [id number]  . furosemide (LASIX) injection 40 mg  40 mg Intravenous Q12H Barton Dubois, MD   40 mg at 12/13/15 2156  . hydrALAZINE (APRESOLINE) tablet 50 mg  50 mg Oral BID Jonetta Osgood, MD   50 mg at 12/14/15 0902  . isosorbide mononitrate (IMDUR) 24 hr tablet 60 mg  60 mg Oral Daily Jonetta Osgood, MD   60 mg at 12/14/15 0901  . metoprolol succinate (TOPROL-XL) 24 hr tablet 25 mg  25 mg Oral BID Jonetta Osgood, MD   25 mg at 12/14/15 0905  . ondansetron (ZOFRAN) injection 4 mg  4 mg Intravenous Q6H PRN Shanker Kristeen Mans, MD      . potassium chloride SA (K-DUR,KLOR-CON) CR tablet 20 mEq  20 mEq Oral BID  Jonetta Osgood, MD   20 mEq at 12/14/15 0906  . pregabalin (LYRICA) capsule 75 mg  75 mg Oral BID Jonetta Osgood, MD   75 mg at 12/14/15 0906  . sodium chloride flush (NS) 0.9 % injection 3 mL  3 mL Intravenous Q12H Jonetta Osgood, MD   3 mL at 12/13/15 2157  . sodium chloride flush (NS) 0.9 % injection 3 mL  3 mL Intravenous PRN Jonetta Osgood, MD      . warfarin (COUMADIN) tablet 2.5 mg  2.5 mg Oral ONCE-1800 Minda Ditto, RPH      . Warfarin - Pharmacist Dosing Inpatient   Does not apply q1800 Nilda Simmer, RPH   Stopped at 12/11/15 1800    Filed Vitals:   12/13/15 1410 12/13/15 2147 12/14/15 0614 12/14/15 0910  BP: 138/51 102/62 103/65 130/65  Pulse:  93 91   Temp: 97.7 F (36.5 C) 97.9 F (36.6 C) 98.3 F (36.8 C)   TempSrc: Oral Oral Oral   Resp: 18 19 18    Height:  Weight:   155 lb 3.2 oz (70.398 kg)   SpO2: 98% 97% 98%     PHYSICAL EXAM General: NAD HEENT: Normal. Neck: No JVD, no thyromegaly.  Lungs: Diminished at bases, otherwise clear CV: Nondisplaced PMI.  Regular rate and rhythm, normal S1/S2, no [id number], III/VI systolic murmur loudest over RUSB.  No pretibial edema.   Abdomen: Soft, nontender, obese.  Neurologic: Alert and oriented x 3.  Psych: Normal affect. Musculoskeletal: No gross deformities.   TELEMETRY: Reviewed telemetry pt in paced rhythm.  LABS: Basic Metabolic Panel:  Recent Labs  12/12/15 0510 12/13/15 0454  NA 141 142  K 4.1 4.0  CL 99* 101  CO2 32 31  GLUCOSE 93 94  BUN 29* 30*  CREATININE 1.84* 1.80*  CALCIUM 9.1 8.6*   Liver Function Tests: No results for input(s): AST, ALT, ALKPHOS, BILITOT, PROT, ALBUMIN in the last 72 hours. No results for input(s): LIPASE, AMYLASE in the last 72 hours. CBC:  Recent Labs  12/13/15 0454  WBC 5.2  HGB 10.1*  HCT 32.5*  MCV 95.3  PLT 144*   Cardiac Enzymes: No results for input(s): CKTOTAL, CKMB, CKMBINDEX, TROPONINI in the last 72 hours. BNP: Invalid input(s):  POCBNP D-Dimer: No results for input(s): DDIMER in the last 72 hours. Hemoglobin A1C: No results for input(s): HGBA1C in the last 72 hours. Fasting Lipid Panel: No results for input(s): CHOL, HDL, LDLCALC, TRIG, CHOLHDL, LDLDIRECT in the last 72 hours. Thyroid Function Tests:  Recent Labs  12/12/15 1018  TSH 0.028*   Anemia Panel: No results for input(s): VITAMINB12, FOLATE, FERRITIN, TIBC, IRON, RETICCTPCT in the last 72 hours.  RADIOLOGY: Dg Chest 2 View  12/10/2015  CLINICAL DATA:  Shortness of breath EXAM: CHEST  2 VIEW COMPARISON:  11/11/2015 FINDINGS: Bilateral small pleural effusion, larger on the left. Dual-chamber pacer from the left, leads in unremarkable position. Stable mild cardiomegaly. There is bibasilar atelectasis or pneumonia. Apical emphysema. Egg shell calcified thoracic inlet mass correlates with multi nodular goiter in 2012 CTA. IMPRESSION: 1. Bibasilar atelectasis or pneumonia. 2. Small bilateral pleural effusion. 3. COPD. Electronically Signed   By: Monte Fantasia M.D.   On: 12/10/2015 13:44      ASSESSMENT AND PLAN: 1. Acute diastolic heart failure secondary to atrial fibrillation s/p DCCV 4/7: Symptomatically stable. Will switch IV Lasix to oral. Appears euvolemic. LVEF normal by TEE (reduced by TTE on 11/10/15). On hydralazine and nitrates for time being. Can be reassessed as outpatient. Continue Toprol-XL. No ACEI/ARB due to CKD.  2. Atrial fibrillation: On warfarin, metoprolol, and amiodarone, now in sinus rhythm s/p DCCV.  3. Aortic stenosis: Moderate by TEE.  4. SSS s/p MDT pacemaker: Interrogated and functioning normally.  5. CAD with elevated troponin: Demand ischemia. Continue to treat medically given CKD. On metoprolol and Zetia. No ASA as on warfarin. Asymptomatic.  Dispo: Should be ready for discharge soon.  Kate Sable, M.D., F.A.C.C.

## 2015-12-16 SURGERY — ECHOCARDIOGRAM, TRANSESOPHAGEAL
Anesthesia: Monitor Anesthesia Care

## 2015-12-17 ENCOUNTER — Encounter (HOSPITAL_COMMUNITY): Payer: Self-pay | Admitting: Internal Medicine

## 2015-12-17 ENCOUNTER — Other Ambulatory Visit: Payer: Self-pay | Admitting: *Deleted

## 2015-12-17 NOTE — Patient Outreach (Signed)
[facility]) Care Management  12/17/2015  Yolanda Spears 12-19-1934 [id number]   Assessment: Transition of Care- week 1 Referral from hospital liaison Lonn [place]) for transition of care and Congestive Heart Failure management and education. Patient had recent hospitalization on 4/4- 12/14/15 for increased shortness of breath related to acute heart failure.  Call placed and spoke with patient who reports "doing fair". She states she "does not seem to be getting any stronger yet". Patient reports she continues to use 4 pillows (baseline) to prop her up to breath better. She verbalized getting short of breath with talking and walking. Has home oxygen set at 2 liters per minute. Encouraged patient to do pursed- lip breathing and to slow down, take rest periods as needed..   Patient denies swelling to lower extremities, productive coughing and chest tightness. She reports that her weight today and yesterday was 149 pounds. . Patient's son continues to manage her medications (using pill box), however, patient takes medications on her own. [place] home health services will continue to work with patient as stated. [facility] caregiver assist patient daily from 8 am to 8 pm as arranged by son since patient lives by herself. Patient has private-pay housekeeper who comes once a month as stated.  Patient agreed to initial home visit next week. Follow-up appointment with cardiology on 4/14. Patient reports that Primary care provider's office called and notified her that they will call back for a follow-up appointment schedule . Patient reports that son or [facility] caregiver provides transportation to appointments. Patient verbalized no immediate needs or concerns at this time. She was encouraged to call [facility] management coordinator or 24-hour nurse line as necessary. Contact information with patient.   Plan: Initial home visit on 12/26/15. Will provide [facility] Financial trader. Will obtain written consent from patient.   Trevaun Rendleman A. Kaeden Depaz, BSN, RN-BC [place] Management Coordinator Cell: [phone]

## 2015-12-20 ENCOUNTER — Ambulatory Visit: Payer: Self-pay | Admitting: Physician Assistant

## 2015-12-26 ENCOUNTER — Other Ambulatory Visit: Payer: Self-pay | Admitting: *Deleted

## 2015-12-26 ENCOUNTER — Encounter: Payer: Self-pay | Admitting: *Deleted

## 2015-12-26 NOTE — Patient Outreach (Signed)
[facility]) Care Management   12/26/2015  Yolanda Spears 01/15/1935 [id number]  Yolanda Spears is an 81 y.o. female  Subjective: Patient reports being "exhausted most of the time even with oxygen". Caregiver ([facility]) states that "any activity seems to take all her energy" and patient has not been eating. Caregiver reports that patient was able to tolerate a bowl of soup provided by her son and "that's all she had today".  Objective: BP 130/60 mmHg  Pulse 60  Resp 22  SpO2 96%   Review of Systems  Constitutional: Positive for malaise/fatigue.       Easily fatigued/ exhausted   HENT: Positive for congestion.        Nasal congestion, teary eyes, runny nose  Eyes: Negative.        Wears eyeglasses for reading  Respiratory: Positive for sputum production and shortness of breath. Negative for cough and wheezing.        Patient gets easily fatigued and short of breath with talking and exertion Diminished lung sounds to left lobe Home oxygen at 2 liters per minute per nasal cannula Propped up on 4 pillows (baseline) to breath better Whitish phlegm expectorated per patient  Cardiovascular: Positive for leg swelling. Negative for chest pain.       Bilateral ankle/feet swelling but non-pitting  Gastrointestinal: Positive for diarrhea. Negative for nausea, vomiting and abdominal pain.       History of diarrhea since after GI surgery per patient Bowel sounds positive Abdomen soft, non-tender Poor appetite  Genitourinary:       Urine incontinence- wears pads  Musculoskeletal: Negative.  Negative for falls.  Skin:       Abrasion to sacral area- with hydrocolloid dressing  Neurological:       Involuntary shakiness to hands  Endo/Heme/Allergies: Bruises/bleeds easily.  Psychiatric/Behavioral: Negative.     Physical Exam  Encounter Medications:   Outpatient Encounter Prescriptions as of 12/26/2015  Medication Sig Note  . amiodarone (PACERONE) 200 MG tablet  Take 200 mg by mouth daily with breakfast.    . calcium carbonate (TUMS - DOSED IN MG ELEMENTAL CALCIUM) 500 MG chewable tablet Chew 1 tablet by mouth daily with lunch. Reported on 12/10/2015   . Cholecalciferol (VITAMIN D-3) 5000 UNITS TABS Take 1 tablet by mouth 4 (four) times a week. Takes on Saturday, Sunday, Tuesday and Thursday   . CRANBERRY PO Take 1 tablet by mouth daily with lunch.   . diphenoxylate-atropine (LOMOTIL) 2.5-0.025 MG tablet Take 1 tablet by mouth 4 (four) times daily as needed for diarrhea or loose stools.   . ezetimibe (ZETIA) 10 MG tablet 1/2 tablet by mouth daily (Patient taking differently: Take 5 mg by mouth daily. )   . furosemide (LASIX) 40 MG tablet Take 1.5 tablets (60 mg total) by mouth 2 (two) times daily. 12/26/2015: Patient reports taking (40 mg) 2 tablets daily  . hydrALAZINE (APRESOLINE) 50 MG tablet Take 1 tablet (50 mg total) by mouth 2 (two) times daily.   . isosorbide mononitrate (IMDUR) 60 MG 24 hr tablet Take 1 tablet (60 mg total) by mouth daily.   . metoprolol succinate (TOPROL-XL) 25 MG 24 hr tablet Take 1 tablet (25 mg total) by mouth 2 (two) times daily.   . potassium chloride (KLOR-CON M10) 10 MEQ tablet Take 2 tablets (20 mEq total) by mouth daily.   . pregabalin (LYRICA) 75 MG capsule Take 75 mg by mouth 2 (two) times daily.   Marland Kitchen warfarin (COUMADIN)  2.5 MG tablet Take 1.25-2.5 mg by mouth at bedtime. Take 1.25mg  everyday except Wednesday will take 2.5mg     No facility-administered encounter medications on file as of 12/26/2015.    Functional Status:   In your present state of health, do you have any difficulty performing the following activities: 12/10/2015 12/10/2015  Hearing? - N  Vision? - N  Difficulty concentrating or making decisions? - N  Walking or climbing stairs? - Y  Dressing or bathing? - Y  Doing errands, shopping? Y -  Conservation officer, nature and eating ? - -  Using the Toilet? - -  In the past six months, have you accidently leaked urine? -  -  Do you have problems with loss of bowel control? - -  Managing your Medications? - -  Managing your Finances? - -  Housekeeping or managing your Housekeeping? - -    Fall/Depression Screening:    PHQ 2/9 Scores 12/26/2015 12/02/2015 11/22/2014  PHQ - 2 Score 0 0 0    Assessment:   81 year old female being seen for Transition of care after recent hospitalization for congestive heart failure (4/4- 4/8) and management/ education on heart failure. Arrived at patient's one level home. Patient is sitted on a chair in her room and currently working with [place] occupational therapists.  Caregiver from [facility] also present at bedside.  Patient noted to be weak and short of breath even just with talking. Caregiver reports that follow-up appointment on 4/14 with cardiology Richardson Dopp, [place]) was cancelled since patient was too weak to go. Follow-up with Dr. Oval Linsey is scheduled on 5/1. Patient was transferred to bed from chair with 2 person-assist after brushing her teeth. It took patient a while to recover from her tiredness with the short transfer and repositioning. Oxygen level ranged between 94-97%. Heart rate is between 60-64. Home health therapists positioned patient in bed and propped her with several pillows to almost a sitting position (baseline). Home oxygen is set at 2 liters per minute per nasal cannula. Patient denies cough and chest pain. She states having whitish mucus and poor appetite.  Weight ranges from 148 - 149 pounds as recorded. Blood pressure readings range from 111/58- 132/78.  Care management coordinator provided [facility] notebook to patient and discussed with caregiver and patient regarding steps in managing heart failure using heart failure booklet, heart failure zone tool, signs and symptoms of heart failure flare-up. [facility] heart failure magnet provided to put on refrigerator door. Patient was able to identify her heart failure medications, verbalized signs and symptoms to  monitor under yellow zone and to call provider to get help. She also mentioned pressing her life alert button to call 911 if she is in the red zone. Patient was offered to reactivate her on EMMI heart failure monitoring but hesitated stating that [place] home health is already doing daily monitoring and calling to check on her status.   Patient shared that son had been in contact with [place] who will send someone next week to evaluate the need for long term care as covered in this insurance.   Care coordination made to obtain a transport chair that is needed by caregiver to bring patient to her follow-up visit with primary provider tomorrow 4/21. Patient's son will coordinate with [place] today to get one as stated by caregiver.  Care management coordinator gave an option to use non-emergency transport if needed. [facility] Go is in the process of obtaining a hospital bed and a  wheelchair for patient's use.  Patient states no additional needs at this time. She agreed to a transition of care call next week 4/25.  Encouraged to call [facility] management coordinator or 24 hour nurse line as needed. Contact information provided to patient.    Plan: Transition of care call 12/31/15.  Margie Billet CM Care Plan Problem One        Most Recent Value   Care Plan Problem One  knowledge deficit regarding Heart Failure management   Role Documenting the Problem One  Care Management Coordinator   Care Plan for Problem One  Active   [facility] Long Term Goal (31-90 days)  patient will identify 5 steps in managing heart failure in the next 31 days   [facility] Long Term Goal Start Date  12/26/15   Interventions for Problem One Long Term Goal  provide [facility] heart failure packet and discuss information about heart failure,  explain to patient the 5 simple steps in managing heart failure,  encourage to read the booklet provided on Living Life Better with Heart Failure and ask questions if any,  encourage patient to  weigh daily and record,  encourage to notify provider for weight gain of 3 pounds overnight or 5 pounds in a week,  encourage adherence to low salt diet,  encourage to use heart failure zone tool to know how she feels everyday   [facility] CM Short Term Goal #1 (0-30 days)  patient will attend scheduled appointment for follow-up with providers with in the next 14 days     [facility] CM Short Term Goal #1 Start Date  12/26/15   Interventions for Short Term Goal #1  confirm with patient that schedule has been set-up for post hospital follow-up with providers (4/21 with PCP & 5/1 Cardiologist),  explain the importance and benefits of attending follow-up appointments with providers,  confirm availability of  transportation to providers' appointments,  caregiver and son will be able to obtain transport chair for patient to use since she gets easily fatigued and short of breath with exertion,  ensure availability of portable oxygen to be used when patient goes out of the house   [facility] CM Short Term Goal #2 (0-30 days)  patient will report adherence to medications (new medication adjustments) in the next 30 days   [facility] CM Short Term Goal #2 Start Date  12/26/15   Interventions for Short Term Goal #2  review medications with patient,  encourage patient to take medications as ordered especially with new medication adjustments made by providers,  discuss the importance of adhering to medications as instructed    [facility] CM Short Term Goal #3 (0-30 days)  patient will identify the heart failure zones and know when to call the doctor for help in the next 30 days   [facility]N CM Short Term Goal #3 Start Date  12/26/15   Interventions for Short Tern Goal #3  provide [facility] packet of information on heart failure and discuss,  explain the different zones of  heart failure and signs and symptoms under each zone,  explain to patient the signs and symptoms of heart failure flare-up and when to call the doctor for help,  encourage patient to use heart  failure zone tool to know how she feels daily,  provide [facility] magnet to be placed on refrigerator door to easily visualize and access the heart failure zones,  use teach back method to gauge patient's understanding     Anahit Klumb A. Osaze Hubbert, BSN, RN-BC [place]  Management Coordinator Cell: [phone]   [place]. Zakariah Dejarnette, BSN, RN-BC [place] Management Coordinator Cell: [phone]

## 2015-12-27 ENCOUNTER — Emergency Department (HOSPITAL_COMMUNITY): Payer: Medicare Other

## 2015-12-27 ENCOUNTER — Encounter (HOSPITAL_COMMUNITY): Payer: Self-pay | Admitting: Emergency Medicine

## 2015-12-27 ENCOUNTER — Inpatient Hospital Stay (HOSPITAL_COMMUNITY)
Admission: EM | Admit: 2015-12-27 | Discharge: 2016-01-06 | DRG: 291 | Disposition: E | Payer: Medicare Other | Attending: Internal Medicine | Admitting: Internal Medicine

## 2015-12-27 ENCOUNTER — Telehealth: Payer: Self-pay | Admitting: Physician Assistant

## 2015-12-27 DIAGNOSIS — R0600 Dyspnea, unspecified: Secondary | ICD-10-CM | POA: Insufficient documentation

## 2015-12-27 DIAGNOSIS — I5020 Unspecified systolic (congestive) heart failure: Secondary | ICD-10-CM | POA: Diagnosis present

## 2015-12-27 DIAGNOSIS — E785 Hyperlipidemia, unspecified: Secondary | ICD-10-CM | POA: Diagnosis present

## 2015-12-27 DIAGNOSIS — N390 Urinary tract infection, site not specified: Secondary | ICD-10-CM | POA: Diagnosis present

## 2015-12-27 DIAGNOSIS — I482 Chronic atrial fibrillation, unspecified: Secondary | ICD-10-CM

## 2015-12-27 DIAGNOSIS — I350 Nonrheumatic aortic (valve) stenosis: Secondary | ICD-10-CM | POA: Diagnosis present

## 2015-12-27 DIAGNOSIS — B961 Klebsiella pneumoniae [K. pneumoniae] as the cause of diseases classified elsewhere: Secondary | ICD-10-CM | POA: Diagnosis present

## 2015-12-27 DIAGNOSIS — N183 Chronic kidney disease, stage 3 unspecified: Secondary | ICD-10-CM | POA: Diagnosis present

## 2015-12-27 DIAGNOSIS — N184 Chronic kidney disease, stage 4 (severe): Secondary | ICD-10-CM | POA: Diagnosis present

## 2015-12-27 DIAGNOSIS — J9622 Acute and chronic respiratory failure with hypercapnia: Secondary | ICD-10-CM | POA: Diagnosis present

## 2015-12-27 DIAGNOSIS — Z515 Encounter for palliative care: Secondary | ICD-10-CM | POA: Insufficient documentation

## 2015-12-27 DIAGNOSIS — I248 Other forms of acute ischemic heart disease: Secondary | ICD-10-CM | POA: Diagnosis present

## 2015-12-27 DIAGNOSIS — R5381 Other malaise: Secondary | ICD-10-CM | POA: Diagnosis present

## 2015-12-27 DIAGNOSIS — Z950 Presence of cardiac pacemaker: Secondary | ICD-10-CM

## 2015-12-27 DIAGNOSIS — E1122 Type 2 diabetes mellitus with diabetic chronic kidney disease: Secondary | ICD-10-CM | POA: Diagnosis present

## 2015-12-27 DIAGNOSIS — J9612 Chronic respiratory failure with hypercapnia: Secondary | ICD-10-CM

## 2015-12-27 DIAGNOSIS — L89152 Pressure ulcer of sacral region, stage 2: Secondary | ICD-10-CM | POA: Diagnosis present

## 2015-12-27 DIAGNOSIS — I130 Hypertensive heart and chronic kidney disease with heart failure and stage 1 through stage 4 chronic kidney disease, or unspecified chronic kidney disease: Principal | ICD-10-CM | POA: Diagnosis present

## 2015-12-27 DIAGNOSIS — Z7901 Long term (current) use of anticoagulants: Secondary | ICD-10-CM

## 2015-12-27 DIAGNOSIS — R110 Nausea: Secondary | ICD-10-CM

## 2015-12-27 DIAGNOSIS — Z8673 Personal history of transient ischemic attack (TIA), and cerebral infarction without residual deficits: Secondary | ICD-10-CM

## 2015-12-27 DIAGNOSIS — I509 Heart failure, unspecified: Secondary | ICD-10-CM

## 2015-12-27 DIAGNOSIS — L8990 Pressure ulcer of unspecified site, unspecified stage: Secondary | ICD-10-CM | POA: Diagnosis present

## 2015-12-27 DIAGNOSIS — D649 Anemia, unspecified: Secondary | ICD-10-CM

## 2015-12-27 DIAGNOSIS — J90 Pleural effusion, not elsewhere classified: Secondary | ICD-10-CM | POA: Diagnosis present

## 2015-12-27 DIAGNOSIS — J9621 Acute and chronic respiratory failure with hypoxia: Secondary | ICD-10-CM | POA: Diagnosis present

## 2015-12-27 DIAGNOSIS — R0602 Shortness of breath: Secondary | ICD-10-CM | POA: Diagnosis not present

## 2015-12-27 DIAGNOSIS — Z9889 Other specified postprocedural states: Secondary | ICD-10-CM

## 2015-12-27 DIAGNOSIS — I639 Cerebral infarction, unspecified: Secondary | ICD-10-CM | POA: Diagnosis present

## 2015-12-27 DIAGNOSIS — Z79899 Other long term (current) drug therapy: Secondary | ICD-10-CM

## 2015-12-27 DIAGNOSIS — R52 Pain, unspecified: Secondary | ICD-10-CM | POA: Insufficient documentation

## 2015-12-27 DIAGNOSIS — I10 Essential (primary) hypertension: Secondary | ICD-10-CM

## 2015-12-27 DIAGNOSIS — I5023 Acute on chronic systolic (congestive) heart failure: Secondary | ICD-10-CM | POA: Diagnosis present

## 2015-12-27 DIAGNOSIS — Z853 Personal history of malignant neoplasm of breast: Secondary | ICD-10-CM

## 2015-12-27 DIAGNOSIS — R791 Abnormal coagulation profile: Secondary | ICD-10-CM | POA: Diagnosis present

## 2015-12-27 DIAGNOSIS — G9340 Encephalopathy, unspecified: Secondary | ICD-10-CM | POA: Diagnosis present

## 2015-12-27 DIAGNOSIS — Z66 Do not resuscitate: Secondary | ICD-10-CM | POA: Diagnosis present

## 2015-12-27 DIAGNOSIS — I5022 Chronic systolic (congestive) heart failure: Secondary | ICD-10-CM

## 2015-12-27 DIAGNOSIS — I4891 Unspecified atrial fibrillation: Secondary | ICD-10-CM | POA: Diagnosis present

## 2015-12-27 DIAGNOSIS — J9611 Chronic respiratory failure with hypoxia: Secondary | ICD-10-CM | POA: Diagnosis present

## 2015-12-27 DIAGNOSIS — Z7189 Other specified counseling: Secondary | ICD-10-CM | POA: Insufficient documentation

## 2015-12-27 DIAGNOSIS — R109 Unspecified abdominal pain: Secondary | ICD-10-CM

## 2015-12-27 HISTORY — DX: Hyperlipidemia, unspecified: E78.5

## 2015-12-27 HISTORY — DX: Essential (primary) hypertension: I10

## 2015-12-27 HISTORY — DX: Chronic kidney disease, stage 3 unspecified: N18.30

## 2015-12-27 HISTORY — DX: Cerebral infarction, unspecified: I63.9

## 2015-12-27 HISTORY — DX: Unspecified atrial fibrillation: I48.91

## 2015-12-27 HISTORY — DX: Enterocolitis due to Clostridium difficile, not specified as recurrent: A04.72

## 2015-12-27 HISTORY — DX: Heart failure, unspecified: I50.9

## 2015-12-27 HISTORY — DX: Chronic kidney disease, stage 3 (moderate): N18.3

## 2015-12-27 HISTORY — DX: Unspecified systolic (congestive) heart failure: I50.20

## 2015-12-27 LAB — URINE MICROSCOPIC-ADD ON

## 2015-12-27 LAB — COMPREHENSIVE METABOLIC PANEL
ALT: 16 U/L (ref 14–54)
ANION GAP: 16 — AB (ref 5–15)
AST: 18 U/L (ref 15–41)
Albumin: 2.7 g/dL — ABNORMAL LOW (ref 3.5–5.0)
Alkaline Phosphatase: 79 U/L (ref 38–126)
BUN: 47 mg/dL — ABNORMAL HIGH (ref 6–20)
CHLORIDE: 97 mmol/L — AB (ref 101–111)
CO2: 28 mmol/L (ref 22–32)
Calcium: 9.4 mg/dL (ref 8.9–10.3)
Creatinine, Ser: 1.95 mg/dL — ABNORMAL HIGH (ref 0.44–1.00)
GFR, EST AFRICAN AMERICAN: 27 mL/min — AB (ref >60)
GFR, EST NON AFRICAN AMERICAN: 23 mL/min — AB (ref >60)
Glucose, Bld: 107 mg/dL — ABNORMAL HIGH (ref 65–99)
POTASSIUM: 4.1 mmol/L (ref 3.5–5.1)
SODIUM: 141 mmol/L (ref 135–145)
Total Bilirubin: 0.8 mg/dL (ref 0.3–1.2)
Total Protein: 6.8 g/dL (ref 6.5–8.1)

## 2015-12-27 LAB — CBC WITH DIFFERENTIAL/PLATELET
Basophils Absolute: 0 10*3/uL (ref 0.0–0.1)
Basophils Relative: 0 %
EOS ABS: 0 10*3/uL (ref 0.0–0.7)
EOS PCT: 0 %
HCT: 36.8 % (ref 36.0–46.0)
Hemoglobin: 10.9 g/dL — ABNORMAL LOW (ref 12.0–15.0)
LYMPHS ABS: 0.3 10*3/uL — AB (ref 0.7–4.0)
Lymphocytes Relative: 4 %
MCH: 29.1 pg (ref 26.0–34.0)
MCHC: 29.6 g/dL — AB (ref 30.0–36.0)
MCV: 98.1 fL (ref 78.0–100.0)
MONO ABS: 0.4 10*3/uL (ref 0.1–1.0)
Monocytes Relative: 6 %
Neutro Abs: 6.6 10*3/uL (ref 1.7–7.7)
Neutrophils Relative %: 90 %
PLATELETS: 220 10*3/uL (ref 150–400)
RBC: 3.75 MIL/uL — AB (ref 3.87–5.11)
RDW: 14.8 % (ref 11.5–15.5)
WBC: 7.3 10*3/uL (ref 4.0–10.5)

## 2015-12-27 LAB — BRAIN NATRIURETIC PEPTIDE: B NATRIURETIC PEPTIDE 5: 539 pg/mL — AB (ref 0.0–100.0)

## 2015-12-27 LAB — I-STAT TROPONIN, ED: Troponin i, poc: 0.06 ng/mL (ref 0.00–0.08)

## 2015-12-27 LAB — I-STAT VENOUS BLOOD GAS, ED
ACID-BASE EXCESS: 9 mmol/L — AB (ref 0.0–2.0)
Bicarbonate: 36.1 mEq/L — ABNORMAL HIGH (ref 20.0–24.0)
O2 Saturation: 50 %
PH VEN: 7.388 — AB (ref 7.250–7.300)
TCO2: 38 mmol/L (ref 0–100)
pCO2, Ven: 59.8 mmHg — ABNORMAL HIGH (ref 45.0–50.0)
pO2, Ven: 28 mmHg — ABNORMAL LOW (ref 31.0–45.0)

## 2015-12-27 LAB — URINALYSIS, ROUTINE W REFLEX MICROSCOPIC
BILIRUBIN URINE: NEGATIVE
GLUCOSE, UA: NEGATIVE mg/dL
KETONES UR: NEGATIVE mg/dL
NITRITE: NEGATIVE
PH: 7 (ref 5.0–8.0)
PROTEIN: 30 mg/dL — AB
Specific Gravity, Urine: 1.011 (ref 1.005–1.030)

## 2015-12-27 LAB — I-STAT CG4 LACTIC ACID, ED: LACTIC ACID, VENOUS: 1.49 mmol/L (ref 0.5–2.0)

## 2015-12-27 LAB — PROTIME-INR: Prothrombin Time: >90 seconds — ABNORMAL HIGH (ref 11.6–15.2)

## 2015-12-27 MED ORDER — DEXTROSE 5 % IV SOLN
1.0000 g | INTRAVENOUS | Status: AC
  Administered 2015-12-27: 1 g via INTRAVENOUS
  Filled 2015-12-27: qty 1

## 2015-12-27 MED ORDER — SODIUM CHLORIDE 0.9 % IV SOLN
10.0000 mL/h | Freq: Once | INTRAVENOUS | Status: AC
  Administered 2015-12-28: 10 mL/h via INTRAVENOUS

## 2015-12-27 MED ORDER — SODIUM CHLORIDE 0.9 % IV BOLUS (SEPSIS): 1000.0000 mL | Freq: Once | INTRAVENOUS | Status: DC

## 2015-12-27 MED ORDER — VANCOMYCIN HCL IN DEXTROSE 1-5 GM/200ML-% IV SOLN
1000.0000 mg | INTRAVENOUS | Status: AC
  Administered 2015-12-28: 1000 mg via INTRAVENOUS
  Filled 2015-12-27: qty 200

## 2015-12-27 MED ORDER — VITAMIN K1 10 MG/ML IJ SOLN
10.0000 mg | Freq: Once | INTRAVENOUS | Status: AC
  Administered 2015-12-28: 10 mg via INTRAVENOUS
  Filled 2015-12-27: qty 1

## 2015-12-27 NOTE — ED Notes (Signed)
Family at bedside. 

## 2015-12-27 NOTE — ED Notes (Signed)
Patient transported to X-ray 

## 2015-12-27 NOTE — Telephone Encounter (Signed)
I called back [name] at [facility]. I lmom that I did get fax now with the DOB however; the name on the fax does not match the name attached to this phone note from today. I have tried over and over to locate pt that is listed on fax Yolanda Spears 01/18/1935 and no such person is in our system. I stated on message not sure if the name on fax is wrong or if pt goes by Yolanda Spears; though our chart shows her name as Yolanda Spears and Yolanda Spears. I do not feel comfortable in getting this paperwork signed until we have all the correct information to make sure that we do have the right pt.

## 2015-12-27 NOTE — ED Provider Notes (Signed)
CSN: [id number]     Arrival date & time 01/02/2016  2110 History   First MD Initiated Contact with Patient 12/30/2015 2116     Chief Complaint  Patient presents with  . Shortness of Breath     (Consider location/radiation/quality/duration/timing/severity/associated sxs/prior Treatment) HPI   81 year old female with past medical history of CHF who presents with acute shortness of breath as well as worsening confusion. Per report from EMS, the patient has had generalized weakness and worsening shortness of breath over the past 24-48 hours. Patient is confused on arrival but does endorse shortness of breath. Denies any known fevers. She states she has been SOB for "a while." Denies any abdominal pain, nausea, or vomiting. Unable to provide further history due to confusion.  REMAINDER OF HISTORY LIMITED DUE TO PATIENT'S AMS.  Additional records reviewed and noted as below.  Past Medical History  Diagnosis Date  . CHF (congestive heart failure) ([place])   . Cancer [facility])    Past Surgical History  Procedure Laterality Date  . Mastectomy    . Breast surgery     History reviewed. No pertinent family history. Social History  Substance Use Topics  . Smoking status: Never Smoker   . Smokeless tobacco: None  . Alcohol Use: No   OB History    No data available     Review of Systems  Constitutional: Positive for chills and fatigue. Negative for fever.  HENT: Negative for congestion and rhinorrhea.   Eyes: Negative for visual disturbance.  Respiratory: Positive for cough and shortness of breath. Negative for wheezing.   Cardiovascular: Negative for chest pain and leg swelling.  Gastrointestinal: Negative for nausea, vomiting, abdominal pain and diarrhea.  Genitourinary: Negative for flank pain.  Musculoskeletal: Negative for neck pain and neck stiffness.  Skin: Negative for rash.  Neurological: Positive for weakness. Negative for syncope and headaches.  Psychiatric/Behavioral: Positive for  confusion.      Allergies  Penicillins  Home Medications   Prior to Admission medications   Not on File   BP 127/40 mmHg  Pulse 65  Temp(Src) 98.4 F (36.9 C) (Oral)  Ht 5\' 3"  (1.6 m)  Wt 74.844 kg  BMI 29.24 kg/m2  SpO2 66% Physical Exam  Constitutional: She appears well-developed.  Elderly, frail-appearing female in NAD.  HENT:  Head: Normocephalic and atraumatic.  Mouth/Throat: No oropharyngeal exudate.  Eyes: Pupils are equal, round, and reactive to light.  Neck: Normal range of motion. Neck supple.  Cardiovascular: Normal rate, regular rhythm and intact distal pulses.  Exam reveals no friction rub.   Murmur (systolic, 3/6) heard. Pulmonary/Chest: Tachypnea noted. No respiratory distress. She has decreased breath sounds in the right lower field, the left upper field, the left middle field and the left lower field. She has no wheezes. She has rhonchi in the right middle field.  Abdominal: Soft. She exhibits no distension. There is no tenderness.  Musculoskeletal: She exhibits edema.  Neurological: She is alert.  Face symmetric. EOM intact. MAE with at least antigravity strength. Mildly confused but protecting airway.  Skin: Skin is warm. No rash noted.  Nursing note and vitals reviewed.   ED Course  Procedures (including critical care time) Labs Review Labs Reviewed  CULTURE, BLOOD (ROUTINE X 2)  CULTURE, BLOOD (ROUTINE X 2)  URINE CULTURE  CBC WITH DIFFERENTIAL/PLATELET  COMPREHENSIVE METABOLIC PANEL  BRAIN NATRIURETIC PEPTIDE  URINALYSIS, ROUTINE W REFLEX MICROSCOPIC (NOT AT [facility])  PROTIME-INR  I-STAT CG4 LACTIC ACID, ED  Randolm Idol, ED  I-STAT VENOUS BLOOD GAS, ED    Imaging Review No results found. I have personally reviewed and evaluated these images and lab results as part of my medical decision-making.   EKG Interpretation   Date/Time:  Friday December 27 2015 21:12:07 EDT Ventricular Rate:  61 PR Interval:  120 QRS Duration: 133 QT  Interval:  503 QTC Calculation: 507 R Axis:   66 Text Interpretation:  Sinus rhythm Right bundle branch block Repol abnrm  suggests ischemia, diffuse leads Sinus rhythm Right bundle branch block T  wave abnormality Abnormal ekg Confirmed by Carmin Muskrat  MD [phone]) on  12/30/2015 9:19:05 PM      MDM   81 yo F with PMHx of HTN, CHF, who p/w acute on chronic SOB and confusion. On arrival, pt normothermic, tachypneic but satting >90% on home 2.5L [place]. Exam as above. EKG without ST elevation. Stat CXR shows large left-sided pleural effusion, right sided effusion. DDx includes: asymmetric effusion 2/2 CHF, PNA with empyema, malignant effusion. Will need admission, likely thoracentesis. Pt HDS, satting well on baseline O2 at this time without emergent indication for intervention. Otherwise, will start ABX due to respiratory distress, concern for possible PNA as CXR limits evaluation.  C shows no leukocytosis. Anemia is at baseline, making hemothorax less likely. CMP shows baseline CKD. INR >10 --> will give IV Vit K, FFP. Pt consented by son. Unclear etiology. No signs of active bleeding. Otherwise, blood gas acceptable. Trop negative. Will admit to Tele bed for possible drainage, monitoring, and CT for further assessment.  Clinical Impression: 1. Acute on chronic congestive heart failure, unspecified congestive heart failure type ([place])   2. SOB (shortness of breath)   3. Essential hypertension   4. HLD (hyperlipidemia)   5. Chronic systolic congestive heart failure ([place])   6. Chronic atrial fibrillation (HCC)   7. CKD (chronic kidney disease), stage III   8. Pleural effusion, left   9. Dyspnea   10. Chronic anemia     Disposition: Admit  Condition: Stable  Pt seen in conjunction with Dr. Vanita Panda.    Duffy Bruce, MD 12/28/15 Danelle Earthly  Carmin Muskrat, MD 12/29/15 [phone]

## 2015-12-27 NOTE — Telephone Encounter (Signed)
New message   Yolanda Spears is asking if fax was received and sent back to company fax number  She is requesting an Attending Physician statement is needed and faxed to [phone]  She verbalized one was sent to [facility] and she stated that it could not be filled out because no DOB was listed on form to identify correct pt

## 2015-12-27 NOTE — ED Notes (Addendum)
Pt arrived via EMS from home with c/o shortness of breath, generalized weakness x 1 days. Pt pale upon arrival. EMS reports rales in RLL and diminished lung sounds throughout. Atril Fib on monitor currently. EMS reports h/o CHF, A-fib, and respiratory distress. Room air saturation upon arrival - 66%. Pt wears O2 at home - 2.5 L/min via [place] 24 hrs/day. Placed on 3L/min via [place] here with O2 sat improving from 66% to 98%.

## 2015-12-28 ENCOUNTER — Encounter: Payer: Self-pay | Admitting: *Deleted

## 2015-12-28 ENCOUNTER — Inpatient Hospital Stay (HOSPITAL_COMMUNITY): Payer: Medicare Other

## 2015-12-28 ENCOUNTER — Encounter (HOSPITAL_COMMUNITY): Payer: Self-pay | Admitting: Internal Medicine

## 2015-12-28 DIAGNOSIS — Z7189 Other specified counseling: Secondary | ICD-10-CM | POA: Diagnosis not present

## 2015-12-28 DIAGNOSIS — I5020 Unspecified systolic (congestive) heart failure: Secondary | ICD-10-CM | POA: Diagnosis present

## 2015-12-28 DIAGNOSIS — E785 Hyperlipidemia, unspecified: Secondary | ICD-10-CM

## 2015-12-28 DIAGNOSIS — J90 Pleural effusion, not elsewhere classified: Secondary | ICD-10-CM | POA: Diagnosis present

## 2015-12-28 DIAGNOSIS — J9621 Acute and chronic respiratory failure with hypoxia: Secondary | ICD-10-CM | POA: Diagnosis present

## 2015-12-28 DIAGNOSIS — Z515 Encounter for palliative care: Secondary | ICD-10-CM | POA: Diagnosis not present

## 2015-12-28 DIAGNOSIS — Z8673 Personal history of transient ischemic attack (TIA), and cerebral infarction without residual deficits: Secondary | ICD-10-CM | POA: Diagnosis not present

## 2015-12-28 DIAGNOSIS — I10 Essential (primary) hypertension: Secondary | ICD-10-CM | POA: Diagnosis present

## 2015-12-28 DIAGNOSIS — R0600 Dyspnea, unspecified: Secondary | ICD-10-CM | POA: Diagnosis not present

## 2015-12-28 DIAGNOSIS — E1122 Type 2 diabetes mellitus with diabetic chronic kidney disease: Secondary | ICD-10-CM | POA: Diagnosis present

## 2015-12-28 DIAGNOSIS — N390 Urinary tract infection, site not specified: Secondary | ICD-10-CM | POA: Diagnosis present

## 2015-12-28 DIAGNOSIS — J9622 Acute and chronic respiratory failure with hypercapnia: Secondary | ICD-10-CM | POA: Diagnosis present

## 2015-12-28 DIAGNOSIS — Z7901 Long term (current) use of anticoagulants: Secondary | ICD-10-CM | POA: Diagnosis not present

## 2015-12-28 DIAGNOSIS — I482 Chronic atrial fibrillation: Secondary | ICD-10-CM | POA: Diagnosis present

## 2015-12-28 DIAGNOSIS — N183 Chronic kidney disease, stage 3 unspecified: Secondary | ICD-10-CM | POA: Diagnosis present

## 2015-12-28 DIAGNOSIS — Z66 Do not resuscitate: Secondary | ICD-10-CM | POA: Diagnosis present

## 2015-12-28 DIAGNOSIS — G9340 Encephalopathy, unspecified: Secondary | ICD-10-CM | POA: Diagnosis present

## 2015-12-28 DIAGNOSIS — B961 Klebsiella pneumoniae [K. pneumoniae] as the cause of diseases classified elsewhere: Secondary | ICD-10-CM | POA: Diagnosis present

## 2015-12-28 DIAGNOSIS — R0602 Shortness of breath: Secondary | ICD-10-CM | POA: Diagnosis present

## 2015-12-28 DIAGNOSIS — R52 Pain, unspecified: Secondary | ICD-10-CM | POA: Diagnosis not present

## 2015-12-28 DIAGNOSIS — J9611 Chronic respiratory failure with hypoxia: Secondary | ICD-10-CM | POA: Diagnosis not present

## 2015-12-28 DIAGNOSIS — I350 Nonrheumatic aortic (valve) stenosis: Secondary | ICD-10-CM | POA: Diagnosis present

## 2015-12-28 DIAGNOSIS — N184 Chronic kidney disease, stage 4 (severe): Secondary | ICD-10-CM | POA: Diagnosis present

## 2015-12-28 DIAGNOSIS — I248 Other forms of acute ischemic heart disease: Secondary | ICD-10-CM | POA: Diagnosis present

## 2015-12-28 DIAGNOSIS — R791 Abnormal coagulation profile: Secondary | ICD-10-CM

## 2015-12-28 DIAGNOSIS — L8990 Pressure ulcer of unspecified site, unspecified stage: Secondary | ICD-10-CM | POA: Diagnosis present

## 2015-12-28 DIAGNOSIS — R5381 Other malaise: Secondary | ICD-10-CM | POA: Diagnosis present

## 2015-12-28 DIAGNOSIS — C50919 Malignant neoplasm of unspecified site of unspecified female breast: Secondary | ICD-10-CM | POA: Insufficient documentation

## 2015-12-28 DIAGNOSIS — I4891 Unspecified atrial fibrillation: Secondary | ICD-10-CM | POA: Diagnosis present

## 2015-12-28 DIAGNOSIS — I5023 Acute on chronic systolic (congestive) heart failure: Secondary | ICD-10-CM | POA: Diagnosis present

## 2015-12-28 DIAGNOSIS — Z853 Personal history of malignant neoplasm of breast: Secondary | ICD-10-CM | POA: Diagnosis not present

## 2015-12-28 DIAGNOSIS — L89152 Pressure ulcer of sacral region, stage 2: Secondary | ICD-10-CM | POA: Diagnosis present

## 2015-12-28 DIAGNOSIS — Z79899 Other long term (current) drug therapy: Secondary | ICD-10-CM | POA: Diagnosis not present

## 2015-12-28 DIAGNOSIS — Z789 Other specified health status: Secondary | ICD-10-CM | POA: Diagnosis not present

## 2015-12-28 DIAGNOSIS — I5022 Chronic systolic (congestive) heart failure: Secondary | ICD-10-CM | POA: Insufficient documentation

## 2015-12-28 DIAGNOSIS — I639 Cerebral infarction, unspecified: Secondary | ICD-10-CM | POA: Diagnosis present

## 2015-12-28 DIAGNOSIS — Z950 Presence of cardiac pacemaker: Secondary | ICD-10-CM | POA: Diagnosis not present

## 2015-12-28 DIAGNOSIS — I130 Hypertensive heart and chronic kidney disease with heart failure and stage 1 through stage 4 chronic kidney disease, or unspecified chronic kidney disease: Secondary | ICD-10-CM | POA: Diagnosis present

## 2015-12-28 LAB — CBC
HCT: 34 % — ABNORMAL LOW (ref 36.0–46.0)
HEMOGLOBIN: 10.2 g/dL — AB (ref 12.0–15.0)
MCH: 28.7 pg (ref 26.0–34.0)
MCHC: 30 g/dL (ref 30.0–36.0)
MCV: 95.8 fL (ref 78.0–100.0)
Platelets: 179 10*3/uL (ref 150–400)
RBC: 3.55 MIL/uL — AB (ref 3.87–5.11)
RDW: 14.8 % (ref 11.5–15.5)
WBC: 7.4 10*3/uL (ref 4.0–10.5)

## 2015-12-28 LAB — BODY FLUID CELL COUNT WITH DIFFERENTIAL
Eos, Fluid: 0 %
Lymphs, Fluid: 12 %
Monocyte-Macrophage-Serous Fluid: 2 % — ABNORMAL LOW (ref 50–90)
NEUTROPHIL FLUID: 86 % — AB (ref 0–25)
Total Nucleated Cell Count, Fluid: 684 cu mm (ref 0–1000)

## 2015-12-28 LAB — COMPREHENSIVE METABOLIC PANEL
ALT: 15 U/L (ref 14–54)
ANION GAP: 13 (ref 5–15)
AST: 18 U/L (ref 15–41)
Albumin: 2.8 g/dL — ABNORMAL LOW (ref 3.5–5.0)
Alkaline Phosphatase: 73 U/L (ref 38–126)
BUN: 49 mg/dL — ABNORMAL HIGH (ref 6–20)
CHLORIDE: 96 mmol/L — AB (ref 101–111)
CO2: 32 mmol/L (ref 22–32)
Calcium: 9.2 mg/dL (ref 8.9–10.3)
Creatinine, Ser: 1.91 mg/dL — ABNORMAL HIGH (ref 0.44–1.00)
GFR calc non Af Amer: 24 mL/min — ABNORMAL LOW (ref >60)
GFR, EST AFRICAN AMERICAN: 28 mL/min — AB (ref >60)
Glucose, Bld: 133 mg/dL — ABNORMAL HIGH (ref 65–99)
Potassium: 3.7 mmol/L (ref 3.5–5.1)
SODIUM: 141 mmol/L (ref 135–145)
Total Bilirubin: 0.9 mg/dL (ref 0.3–1.2)
Total Protein: 6.6 g/dL (ref 6.5–8.1)

## 2015-12-28 LAB — HEPATIC FUNCTION PANEL
ALT: 13 U/L — ABNORMAL LOW (ref 14–54)
AST: 25 U/L (ref 15–41)
Albumin: 2.8 g/dL — ABNORMAL LOW (ref 3.5–5.0)
Alkaline Phosphatase: 68 U/L (ref 38–126)
Bilirubin, Direct: 0.3 mg/dL (ref 0.1–0.5)
Indirect Bilirubin: 1 mg/dL — ABNORMAL HIGH (ref 0.3–0.9)
Total Bilirubin: 1.3 mg/dL — ABNORMAL HIGH (ref 0.3–1.2)
Total Protein: 6.7 g/dL (ref 6.5–8.1)

## 2015-12-28 LAB — TYPE AND SCREEN
ABO/RH(D): A POS
Antibody Screen: NEGATIVE

## 2015-12-28 LAB — BLOOD GAS, ARTERIAL
Acid-Base Excess: 9.7 mmol/L — ABNORMAL HIGH (ref 0.0–2.0)
Bicarbonate: 35.9 meq/L — ABNORMAL HIGH (ref 20.0–24.0)
Drawn by: 246861
O2 Content: 3 L/min
O2 Saturation: 97.5 %
Patient temperature: 98.6
TCO2: 38.2 mmol/L (ref 0–100)
pCO2 arterial: 72.9 mmHg (ref 35.0–45.0)
pH, Arterial: 7.314 — ABNORMAL LOW (ref 7.350–7.450)
pO2, Arterial: 108 mmHg — ABNORMAL HIGH (ref 80.0–100.0)

## 2015-12-28 LAB — PROTEIN, BODY FLUID

## 2015-12-28 LAB — LACTATE DEHYDROGENASE, PLEURAL OR PERITONEAL FLUID: LD, Fluid: 98 U/L — ABNORMAL HIGH (ref 3–23)

## 2015-12-28 LAB — GRAM STAIN: GRAM STAIN: NONE SEEN

## 2015-12-28 LAB — I-STAT CG4 LACTIC ACID, ED: Lactic Acid, Venous: 1.1 mmol/L (ref 0.5–2.0)

## 2015-12-28 LAB — GLUCOSE, SEROUS FLUID: GLUCOSE FL: 129 mg/dL

## 2015-12-28 LAB — PROTIME-INR
INR: 8.48 — AB (ref 0.00–1.49)
INR: 1.79 — ABNORMAL HIGH (ref 0.00–1.49)
PROTHROMBIN TIME: 20.7 s — AB (ref 11.6–15.2)
Prothrombin Time: 66.9 seconds — ABNORMAL HIGH (ref 11.6–15.2)

## 2015-12-28 LAB — LACTATE DEHYDROGENASE: LDH: 285 U/L — ABNORMAL HIGH (ref 98–192)

## 2015-12-28 LAB — ABO/RH: ABO/RH(D): A POS

## 2015-12-28 MED ORDER — ACETAMINOPHEN 325 MG PO TABS: 650.0000 mg | ORAL_TABLET | Freq: Four times a day (QID) | ORAL | Status: DC | PRN

## 2015-12-28 MED ORDER — LIDOCAINE HCL (PF) 1 % IJ SOLN
INTRAMUSCULAR | Status: AC
  Filled 2015-12-28: qty 10

## 2015-12-28 MED ORDER — FUROSEMIDE 10 MG/ML IJ SOLN
80.0000 mg | Freq: Every day | INTRAMUSCULAR | Status: DC
  Administered 2015-12-28: 80 mg via INTRAVENOUS
  Filled 2015-12-28 (×2): qty 8

## 2015-12-28 MED ORDER — ONDANSETRON HCL 4 MG/2ML IJ SOLN
4.0000 mg | Freq: Four times a day (QID) | INTRAMUSCULAR | Status: DC | PRN
  Administered 2015-12-29: 4 mg via INTRAVENOUS
  Filled 2015-12-28: qty 2

## 2015-12-28 MED ORDER — FUROSEMIDE 20 MG PO TABS: 80.0000 mg | ORAL_TABLET | Freq: Every day | ORAL | Status: DC

## 2015-12-28 MED ORDER — EZETIMIBE 10 MG PO TABS: 5.0000 mg | ORAL_TABLET | Freq: Every evening | ORAL | Status: DC

## 2015-12-28 MED ORDER — WARFARIN SODIUM 2 MG PO TABS: 1.0000 mg | ORAL_TABLET | Freq: Once | ORAL | Status: DC

## 2015-12-28 MED ORDER — SODIUM CHLORIDE 0.9% FLUSH
3.0000 mL | Freq: Two times a day (BID) | INTRAVENOUS | Status: DC
  Administered 2015-12-28 – 2015-12-31 (×8): 3 mL via INTRAVENOUS

## 2015-12-28 MED ORDER — PREGABALIN 75 MG PO CAPS
75.0000 mg | ORAL_CAPSULE | Freq: Two times a day (BID) | ORAL | Status: DC
  Administered 2015-12-28: 75 mg via ORAL
  Filled 2015-12-28: qty 1

## 2015-12-28 MED ORDER — ONDANSETRON HCL 4 MG PO TABS: 4.0000 mg | ORAL_TABLET | Freq: Four times a day (QID) | ORAL | Status: DC | PRN

## 2015-12-28 MED ORDER — CRANBERRY 250 MG PO TABS: 1.0000 | ORAL_TABLET | Freq: Every day | ORAL | Status: DC

## 2015-12-28 MED ORDER — HYDRALAZINE HCL 50 MG PO TABS
50.0000 mg | ORAL_TABLET | Freq: Two times a day (BID) | ORAL | Status: DC
  Administered 2015-12-28 (×2): 50 mg via ORAL
  Filled 2015-12-28 (×2): qty 1

## 2015-12-28 MED ORDER — METOPROLOL SUCCINATE ER 25 MG PO TB24
25.0000 mg | ORAL_TABLET | Freq: Two times a day (BID) | ORAL | Status: DC
  Administered 2015-12-28 (×2): 25 mg via ORAL
  Filled 2015-12-28 (×2): qty 1

## 2015-12-28 MED ORDER — AMIODARONE HCL 200 MG PO TABS
200.0000 mg | ORAL_TABLET | Freq: Every day | ORAL | Status: DC
  Administered 2015-12-28: 200 mg via ORAL
  Filled 2015-12-28: qty 1

## 2015-12-28 MED ORDER — FUROSEMIDE 10 MG/ML IJ SOLN
40.0000 mg | Freq: Two times a day (BID) | INTRAMUSCULAR | Status: DC
  Administered 2015-12-28 – 2015-12-31 (×6): 40 mg via INTRAVENOUS
  Filled 2015-12-28 (×6): qty 4

## 2015-12-28 MED ORDER — DEXTROSE 5 % IV SOLN
500.0000 mg | Freq: Three times a day (TID) | INTRAVENOUS | Status: DC
  Administered 2015-12-28: 500 mg via INTRAVENOUS
  Filled 2015-12-28 (×4): qty 0.5

## 2015-12-28 MED ORDER — WARFARIN - PHARMACIST DOSING INPATIENT: Freq: Every day | Status: DC

## 2015-12-28 MED ORDER — CALCIUM CARBONATE ANTACID 500 MG PO CHEW
1.0000 | CHEWABLE_TABLET | Freq: Every day | ORAL | Status: DC
  Administered 2015-12-28: 200 mg via ORAL
  Filled 2015-12-28: qty 1

## 2015-12-28 MED ORDER — DEXTROSE 5 % IV SOLN
1.0000 g | INTRAVENOUS | Status: AC
  Administered 2015-12-28 – 2015-12-30 (×3): 1 g via INTRAVENOUS
  Filled 2015-12-28 (×4): qty 10

## 2015-12-28 MED ORDER — VITAMIN D 1000 UNITS PO TABS
1000.0000 [IU] | ORAL_TABLET | ORAL | Status: DC
  Administered 2015-12-28: 1000 [IU] via ORAL
  Filled 2015-12-28: qty 1

## 2015-12-28 MED ORDER — ACETAMINOPHEN 650 MG RE SUPP
650.0000 mg | Freq: Four times a day (QID) | RECTAL | Status: DC | PRN
  Administered 2015-12-29: 650 mg via RECTAL
  Filled 2015-12-28: qty 1

## 2015-12-28 MED ORDER — ISOSORBIDE MONONITRATE ER 60 MG PO TB24
60.0000 mg | ORAL_TABLET | Freq: Every day | ORAL | Status: DC
  Administered 2015-12-28: 60 mg via ORAL
  Filled 2015-12-28: qty 1

## 2015-12-28 NOTE — Progress Notes (Signed)
Pt was alert times 2-3 this morning around 8 am, was following the commands somewhat, and was able to remember the year but right now at 12pm pt has a flat effect, not responding to the voice, vitals stable, 4th unit of plasma running on, following up with ultrasound for possible thoracentesis today after this plasma done, pt's sister is in bed side and aware about the situation

## 2015-12-28 NOTE — H&P (Addendum)
History and Physical    Yolanda Spears [id number] DOB: [birth year] DOA: 12/16/2015  Referring MD/NP/PA:   PCP: No primary care provider on file.  Outpatient Specialists: none Patient coming from:  Home  Chief Complaint: AMS, SOB  HPI: Yolanda Spears is a 81 y.o. female with medical history significant of hypertension, diet-controlled diabetes, systolic congestive heart failure with EF of 35-40 percent, breast cancer, atrial fibrillation on Coumadin, chronic kidney disease-stage III, stroke, C. difficile colitis, pacemaker placement, who presents with altered mental status and the shortness breath.  Per patient's son, patient has worsening shortness of breath today. Patient does not have cough, fever, chills or chest pain. She has generalized weakness. Patient is confused and disoriented today. Patient does not have unilateral weakness per her son. Patient has chronic mild diarrhea at baseline, 1-2 bowel movements each day, which has not changed. Patient does not have nausea, vomiting or abdominal pain. Not sure if patient has symptoms of UTI. No active bleeding.  ED Course: pt was found to have supratherapeutic INR>10, lactate 1.49, negative troponin, BNP 539, positive urinalysis with large amount of leukocytes, stable renal function, temperature normal, no tachycardia, oxygen desaturation to 66%, which improved to 100% on nasal cannula oxygen. Chest x-ray showed complete opacification of the left hemi thorax; moderate size right pleural effusion with basilar atelectasis or infiltration. Patient is admitted to inpatient for further eval and treatment.  # pt has two medical record, another one is [id number]  Can patient participate in ADLs?  Barely   Review of Systems: Could not be reviewed accurately due to altered mental status.   Allergy:  Allergies  Allergen Reactions  . Penicillins Hives    Past Medical History  Diagnosis Date  . CHF (congestive heart failure) ([place])   . Breast  cancer ([place])   . Essential hypertension   . HLD (hyperlipidemia)   . Systolic congestive heart failure ([place])   . A-fib ([place])   . CKD (chronic kidney disease), stage III   . Stroke (cerebrum) ([place])   . C. difficile colitis     Past Surgical History  Procedure Laterality Date  . Mastectomy    . Breast surgery      Social History:  reports that she has never smoked. She does not have any smokeless tobacco history on file. She reports that she does not drink alcohol or use illicit drugs.  Family History: History reviewed. No pertinent family history. Could not be reviewed accurately due to altered mental status.  Prior to Admission medications   Medication Sig Start Date End Date Taking? Authorizing Provider  amiodarone (PACERONE) 200 MG tablet Take 200 mg by mouth daily.   Yes Historical Provider, MD  calcium carbonate (TUMS - DOSED IN MG ELEMENTAL CALCIUM) 500 MG chewable tablet Chew 1 tablet by mouth daily.   Yes Historical Provider, MD  Cholecalciferol (VITAMIN D-3 PO) Take 1 tablet by mouth See admin instructions. On Sunday, Tuesday, Thursday and Saturday   Yes Historical Provider, MD  CRANBERRY PO Take 1 tablet by mouth daily.   Yes Historical Provider, MD  ezetimibe (ZETIA) 10 MG tablet Take 5 mg by mouth every evening.   Yes Historical Provider, MD  furosemide (LASIX) 40 MG tablet Take 80 mg by mouth daily.   Yes Historical Provider, MD  hydrALAZINE (APRESOLINE) 50 MG tablet Take 50 mg by mouth 2 (two) times daily.   Yes Historical Provider, MD  isosorbide mononitrate (IMDUR) 60 MG 24 hr tablet Take 60 mg by mouth daily.  Yes Historical Provider, MD  metoprolol succinate (TOPROL-XL) 25 MG 24 hr tablet Take 25 mg by mouth 2 (two) times daily.   Yes Historical Provider, MD  potassium chloride (K-DUR) 10 MEQ tablet Take 10 mEq by mouth daily.   Yes Historical Provider, MD  pregabalin (LYRICA) 75 MG capsule Take 75 mg by mouth 2 (two) times daily.   Yes Historical Provider, MD    warfarin (COUMADIN) 2.5 MG tablet Take 1.25-2.5 mg by mouth See admin instructions. Take 1 tablet on Wednesday then take 1/2 tablet all the other days   Yes Historical Provider, MD    Physical Exam: Filed Vitals:   12/28/15 0145 12/28/15 0152 12/28/15 0200 12/28/15 0215  BP: 116/44  129/82 127/44  Pulse: 60  61 63  Temp:  97.7 F (36.5 C) 97.7 F (36.5 C)   TempSrc:      Resp: 17  16 19   Height:      Weight:      SpO2: 100%  100% 100%   General: Not in acute distress HEENT:       Eyes: PERRL, EOMI, no scleral icterus.       ENT: No discharge from the ears and nose, no pharynx injection, no tonsillar enlargement.        Neck: No JVD, no bruit, no mass felt. Heme: No neck lymph node enlargement. Cardiac: S1/S2, RRR, No murmurs, No gallops or rubs. Pulm: Severely decreased air movement on the left side. No rales, wheezing, rhonchi or rubs. Abd: Soft, nondistended, nontender, no rebound pain, no organomegaly, BS present. GU: No hematuria Ext: No pitting leg edema bilaterally. 2+DP/PT pulse bilaterally. Musculoskeletal: No joint deformities, No joint redness or warmth, no limitation of ROM in spin. Skin: has stage II sacral pressure ulcer Neuro: Alert, not oriented X3, cranial nerves II-XII grossly intact, moves all extremities normally. Psych: Patient is not psychotic, no suicidal or hemocidal ideation.  Labs on Admission: I have personally reviewed following labs and imaging studies  CBC:  Recent Labs Lab 12/13/2015 2213 12/28/15 0149  WBC 7.3 7.4  NEUTROABS 6.6  --   HGB 10.9* 10.2*  HCT 36.8 34.0*  MCV 98.1 95.8  PLT 220 0000000   Basic Metabolic Panel:  Recent Labs Lab 12/15/2015 2213  NA 141  K 4.1  CL 97*  CO2 28  GLUCOSE 107*  BUN 47*  CREATININE 1.95*  CALCIUM 9.4   GFR: Estimated Creatinine Clearance: 23 mL/min (by C-G formula based on Cr of 1.95). Liver Function Tests:  Recent Labs Lab 12/17/2015 2213  AST 18  ALT 16  ALKPHOS 79  BILITOT 0.8   PROT 6.8  ALBUMIN 2.7*   No results for input(s): LIPASE, AMYLASE in the last 168 hours. No results for input(s): AMMONIA in the last 168 hours. Coagulation Profile:  Recent Labs Lab 12/26/2015 2213 12/28/15 0149  INR >10.00* 8.48*   Cardiac Enzymes: No results for input(s): CKTOTAL, CKMB, CKMBINDEX, TROPONINI in the last 168 hours. BNP (last 3 results) No results for input(s): PROBNP in the last 8760 hours. HbA1C: No results for input(s): HGBA1C in the last 72 hours. CBG: No results for input(s): GLUCAP in the last 168 hours. Lipid Profile: No results for input(s): CHOL, HDL, LDLCALC, TRIG, CHOLHDL, LDLDIRECT in the last 72 hours. Thyroid Function Tests: No results for input(s): TSH, T4TOTAL, FREET4, T3FREE, THYROIDAB in the last 72 hours. Anemia Panel: No results for input(s): VITAMINB12, FOLATE, FERRITIN, TIBC, IRON, RETICCTPCT in the last 72 hours. Urine analysis:  Component Value Date/Time   COLORURINE YELLOW 12/10/2015 2316   APPEARANCEUR TURBID* 12/28/2015 2316   LABSPEC 1.011 12/19/2015 2316   PHURINE 7.0 12/18/2015 2316   GLUCOSEU NEGATIVE 12/16/2015 2316   HGBUR MODERATE* 12/26/2015 2316   BILIRUBINUR NEGATIVE 12/16/2015 2316   KETONESUR NEGATIVE 01/03/2016 2316   PROTEINUR 30* 12/29/2015 2316   NITRITE NEGATIVE 12/07/2015 2316   LEUKOCYTESUR LARGE* 12/25/2015 2316   Sepsis Labs: @LABRCNTIP (procalcitonin:4,lacticidven:4) )No results found for this or any previous visit (from the past 240 hour(s)).   Radiological Exams on Admission: Dg Chest 2 View  12/08/2015  CLINICAL DATA:  Shortness of breath for a few days. Generalize weakness. EXAM: CHEST  2 VIEW COMPARISON:  None. FINDINGS: There is complete opacification of the left hemi thorax. In the absence of prior surgery, this could represent either or a combination of diffuse consolidation, large pleural effusion, and atelectasis. Volume loss on the left with mild hyperinflation on the right. There is a  moderate-sized right pleural effusion. There is focal infiltration in the right lower lung which could represent pneumonia or atelectasis. Heart size is obscured by the parenchymal process. No definite vascular congestion on the right. Calcification of the aorta. Degenerative changes in the spine. Cardiac pacemaker. IMPRESSION: Complete opacification of the left hemi thorax. Moderate size right pleural effusion with basilar atelectasis or infiltration. Electronically Signed   By: Lucienne Capers M.D.   On: 12/26/2015 21:56     EKG: Independently reviewed. QTC 482, atrial fibrillation, T-wave inversion inferior leads which is easily previous EKG on 12/10/15, widened QRS wave.  Assessment/Plan Principal Problem:   SOB (shortness of breath) Active Problems:   Essential hypertension   Systolic congestive heart failure (HCC)   A-fib (H[facility])   CKD (chronic kidney disease), stage III   Stroke (cerebrum) (HCC)   Acute encephalopathy   Pleural effusion, bilateral   UTI (lower urinary tract infection)   Supratherapeutic INR   Pleural effusion   Pressure ulcer  SOB (shortness of breath): Most likely due to bilateral pleural effusion with complete opacification of the left hemi thorax and moderate size right pleural effusion. Pt dose not have cough, chest pain, leukocytosis or fever, less likely to have empyema or pneumonia. Probably due to congestive heart failure and CKD-III. IV vancomycin and cefepime were started by EDP with concerning of HCAP/empyema. I do not think pt has HCAP or empyema, will switch to Aztreonam for UTI.  -will admit to tele bed as inpt -IV lasix 80 daily -please call IR to thoracentesis in AM if INR is OK. -CT chest for further evaluation of pleural effusion  Possible UTI: Patient has a positive urinalysis is large amount of leukocytes. Not sure whether patient has a symptoms of UTI due to AMS. -IV aztreonam -Follow-up blood culture the urine culture  Acute encephalopathy:  Etiology is not clear. No focal neurological findings, less likely to have stroke. Possibly due to hypoxia and a UTI. -Frequent neuro check  Systolic congestive heart failure ([place]): 2-D echo on 11/10/15 showed EF of 35-40 percent. Patient is on Lasix 80 mg daily. She does not have leg edema, but she has severe pleural effusion. BNP slightly elevated at 539, indicating mild exacerbation. -IV Lasix 80 mg daily -Continue metoprolol  Atrial Fibrillation: CHA2DS2-VASc Score is 8, needs oral anticoagulation. Patient is on Coumadin at home. INR>10 on admission. No active bleeding. Hemoglobin stable. Heart rate is well controlled. -hold Coumadin -EDP ordered 10 mg Vk and 4 units of FFP. Sine pt will need  thoracentesis, this treatment is appropriate. -continue amiodarone, metoprolol -daily INR   HTN: -on IV lasix -Continue metoprolol, hydralazine  CKD (chronic kidney disease), stage III: Previous creatinine 1.84 on 12/12/15. Her creatinine is 1.95, which is close to baseline. -Follow-up renal function by BMP  Hx of stroke: -on coumadin -continue zetia  Pressure ulcer: Stage II sacral ulcer -Consult wound care  DVT ppx: SCD Code Status: DNR Family Communication:  Yes, patient's son at bed side Disposition Plan:  Anticipate discharge back to previous home environment Consults called: None Admission status:  inpatient / tele    Date of Service 12/28/2015    Ivor Costa [facility] Pager [phone]  If 7PM-7AM, please contact night-coverage [email or web address] Password [email or web address] 12/28/2015, 3:11 AM

## 2015-12-28 NOTE — Consult Note (Addendum)
WOC wound consult note Reason for Consult: area of intertriginous dermatitis, not pressure, between the gluteal cleft. Wound type:moisture associated skin damage (MASD), specifically, intertriginous dermatitis (ITD) Pressure Ulcer POA: No Measurement:2.4cm x 0.4cm x 0.2cm Wound bed:red, moist Drainage (amount, consistency, odor) scant serous Periwound:intact, dry Dressing procedure/placement/frequency: I will provide a pressure redistribution chair cushion for OOB use and what provided Nursing with guidance via the orders for placement of a soft silicone foam dressing. [place] nursing team will not follow, but will remain available to this patient, the nursing and medical teams.  Please re-consult if needed. Thanks, Maudie Flakes, MSN, RN, [place], Arther Abbott  Pager# [phone]

## 2015-12-28 NOTE — Progress Notes (Signed)
Pt back from procedure thoracentesis, vitals checked and recorded, pt is responsing to voice otherwise has a flat effect, is in NPO, medicines are on hold this moment until the swallow evaluation done (risk of aspiration), family members are in bed side, aware about the situation, will continue to monitor the patient.

## 2015-12-28 NOTE — Progress Notes (Signed)
Pharmacy Antibiotic Note  Yolanda Spears is a 81 y.o. female admitted on 12/13/2015 with UTI.  Pharmacy has been consulted for aztreonam dosing.  Plan: Azactam 500mg  IV Q8H.  Height: 5\' 3"  (160 cm) Weight: 165 lb (74.844 kg) IBW/kg (Calculated) : 52.4  Temp (24hrs), Avg:98.1 F (36.7 C), Min:97.7 F (36.5 C), Max:98.4 F (36.9 C)   Recent Labs Lab 12/21/2015 2213 12/14/2015 2223  WBC 7.3  --   CREATININE 1.95*  --   LATICACIDVEN  --  1.49    Estimated Creatinine Clearance: 23 mL/min (by C-G formula based on Cr of 1.95).    Allergies  Allergen Reactions  . Penicillins Hives     Thank you for allowing pharmacy to be a part of this patient's care.  Wynona Neat, PharmD, BCPS  12/28/2015 1:37 AM

## 2015-12-28 NOTE — Procedures (Signed)
LEFT thoracentesis under [place] Bloody fluid returned, sample for requested labs. No complication No blood loss. See complete dictation in [facility]. Post CXR pending

## 2015-12-28 NOTE — Progress Notes (Signed)
During the handoff report this morning at 7am second unit of plasma was running, two more units to go, but during the end of second unit plasma transfusion, checked the vitals and tried to stop and complete the plasma, couldn't find out the recording of that second unit plasma transfusion, verified with charge nurse, scanned the plasma again and went back to the previous timing and fixed the errors, will continue to monitor the patient.

## 2015-12-28 NOTE — Progress Notes (Signed)
PROGRESS NOTE                                                                                                                                                                                                             Patient Demographics:    Yolanda Spears, is a 81 y.o. female, DOB - 03-04-1935, [id number]  Admit date - 12/12/2015   Admitting Physician Ivor Costa, MD  Outpatient Primary MD for the patient is No primary care provider on file.  LOS - 0  Outpatient Specialists:   Chief Complaint  Patient presents with  . Shortness of Breath       Brief Narrative    Yolanda Spears is a 81 y.o. female with medical history significant of hypertension, diet-controlled diabetes, systolic congestive heart failure with EF of 35-40 percent, breast cancer, atrial fibrillation on Coumadin, chronic kidney disease-stage III, stroke, C. difficile colitis, pacemaker placement, who presents with altered mental status and the shortness breath.  Per patient's son, patient has worsening shortness of breath today. Patient does not have cough, fever, chills or chest pain. She has generalized weakness. Patient is confused and disoriented today. Patient does not have unilateral weakness per her son. Patient has chronic mild diarrhea at baseline, 1-2 bowel movements each day, which has not changed. Patient does not have nausea, vomiting or abdominal pain. Not sure if patient has symptoms of UTI. No active bleeding.  ED Course: pt was found to have supratherapeutic INR>10, lactate 1.49, negative troponin, BNP 539, positive urinalysis with large amount of leukocytes, stable renal function, temperature normal, no tachycardia, oxygen desaturation to 66%, which improved to 100% on nasal cannula oxygen. Chest x-ray showed complete opacification of the left hemi thorax; moderate size right pleural effusion with basilar atelectasis or infiltration. Patient is  admitted to inpatient for further eval and treatment.  # pt has two medical record, another one is [id number]      Subjective:    Yolanda Spears today has, No headache, No chest pain, No abdominal pain - No Nausea, No new weakness tingling or numbness, No Cough but +ve SOB.     Assessment  & Plan :     1. Acute hypoxic and hypercapnic respiratory failure. Pulmonary consulted, patient is DO NOT RESUSCITATE, majority of the problem appears to be secondary to left-sided large pleural effusion. She is  afebrile, no leukocytosis, stop antibiotics, continue diuresis for CHF and pleural effusion, IR requested to do diagnostic and therapeutic left-sided paracentesis on 12/28/2015. Continue supportive care with oxygen and nebulizer treatments. Poor BiPAP candidate due to generalized weakness, advanced age and mild somnolent status.  2. Acute encephalopathy. Likely combination of hypercapnia and initial hypoxia. No focal deficits or headache, supportive care as above and monitor. Minimize narcotics and benzodiazepines.  3. UTI. Taper down to Rocephin, monitor cultures, not septic.  4. Acute on chronic systolic heart failure. Recent EF 35%. IV Lasix, BNP is 540, continue beta blocker & BiDil, due to renal insufficiency no ACE/ARB. Monitor.  5. Chronic atrial fibrillation with [place] vasc 2 score of 8. Goal will be rate control, continue beta blocker and amiodarone, Coumadin per pharmacy. Supratherapeutic INR was reversed upon admission.  6. Essential hypertension. Continue BiDil and beta blocker.  7. CK D stage IV. Baseline creatinine close to 1.8. Monitor with diuresis.  8. History of stroke. On Coumadin intensity of her secondary prevention continue.  9. Stage II sacral decubitus ulcer. Wound care consulted.    Code Status : DNR, prognosis poor.  Family Communication  : None  Disposition Plan  : Stay inpatient  Barriers For Discharge : Acute hypoxic and hypercapnic respiratory  failure  Consults  :  PCCM, IR  Procedures  :   Left-sided thoracentesis ordered on 12/28/2015 by pulmonary  DVT Prophylaxis  :  Coumadin/SCDs  Lab Results  Component Value Date   PLT 179 12/28/2015    Antibiotics  :   Anti-infectives    Start     Dose/Rate Route Frequency Ordered Stop   12/28/15 0200  aztreonam (AZACTAM) 500 mg in dextrose 5 % 50 mL IVPB     500 mg 100 mL/hr over 30 Minutes Intravenous Every 8 hours 12/28/15 0140     12/28/2015 2245  vancomycin (VANCOCIN) IVPB 1000 mg/200 mL premix     1,000 mg 200 mL/hr over 60 Minutes Intravenous STAT 12/21/2015 2239 12/28/15 0106   01/03/2016 2245  ceFEPIme (MAXIPIME) 1 g in dextrose 5 % 50 mL IVPB     1 g 100 mL/hr over 30 Minutes Intravenous STAT 12/08/2015 2242 12/17/2015 2354        Objective:   Filed Vitals:   12/28/15 0847 12/28/15 0921 12/28/15 0936 12/28/15 0951  BP: 140/65 140/60 138/64 140/58  Pulse: 70 70 70 70  Temp: 98 F (36.7 C) 98 F (36.7 C) 98 F (36.7 C) 98 F (36.7 C)  TempSrc: Oral Oral Oral Oral  Resp: 18 18 18 18   Height:      Weight:      SpO2: 98% 98% 98% 98%    Wt Readings from Last 3 Encounters:  12/28/15 68.8 kg (151 lb 10.8 oz)     Intake/Output Summary (Last 24 hours) at 12/28/15 1054 Last data filed at 12/28/15 0900  Gross per 24 hour  Intake    583 ml  Output    130 ml  Net    453 ml     Physical Exam  Mildly somnolent, No new F.N deficits, Normal affect [place], AT,PERRAL Supple Neck,No JVD, No cervical lymphadenopathy appriciated.  Symmetrical Chest wall movement, reduced L sided B sounds RRR,No Gallops,Rubs or new Murmurs, No Parasternal Heave +ve B.Sounds, Abd Soft, No tenderness, No organomegaly appriciated, No rebound - guarding or rigidity. No Cyanosis, Clubbing or edema, No new Rash or bruise     Data Review:    CBC  Recent  Labs Lab 01/03/2016 2213 12/28/15 0149  WBC 7.3 7.4  HGB 10.9* 10.2*  HCT 36.8 34.0*  PLT 220 179  MCV 98.1 95.8  MCH 29.1 28.7   MCHC 29.6* 30.0  RDW 14.8 14.8  LYMPHSABS 0.3*  --   MONOABS 0.4  --   EOSABS 0.0  --   BASOSABS 0.0  --     Chemistries   Recent Labs Lab 12/11/2015 2213 12/28/15 0353  NA 141 141  K 4.1 3.7  CL 97* 96*  CO2 28 32  GLUCOSE 107* 133*  BUN 47* 49*  CREATININE 1.95* 1.91*  CALCIUM 9.4 9.2  AST 18 18  ALT 16 15  ALKPHOS 79 73  BILITOT 0.8 0.9   ------------------------------------------------------------------------------------------------------------------ No results for input(s): CHOL, HDL, LDLCALC, TRIG, CHOLHDL, LDLDIRECT in the last 72 hours.  No results found for: HGBA1C ------------------------------------------------------------------------------------------------------------------ No results for input(s): TSH, T4TOTAL, T3FREE, THYROIDAB in the last 72 hours.  Invalid input(s): FREET3 ------------------------------------------------------------------------------------------------------------------ No results for input(s): VITAMINB12, FOLATE, FERRITIN, TIBC, IRON, RETICCTPCT in the last 72 hours.  Coagulation profile  Recent Labs Lab 12/13/2015 2213 12/28/15 0149 12/28/15 0713  INR >10.00* 8.48* 1.79*    No results for input(s): DDIMER in the last 72 hours.  Cardiac Enzymes No results for input(s): CKMB, TROPONINI, MYOGLOBIN in the last 168 hours.  Invalid input(s): CK ------------------------------------------------------------------------------------------------------------------    Component Value Date/Time   BNP 539.0* 12/28/2015 2213    Inpatient Medications  Scheduled Meds: . amiodarone  200 mg Oral Daily  . aztreonam  500 mg Intravenous Q8H  . calcium carbonate  1 tablet Oral Daily  . cholecalciferol  1,000 Units Oral Q T,Th,S,Su  . ezetimibe  5 mg Oral QPM  . furosemide  40 mg Intravenous Q12H  . hydrALAZINE  50 mg Oral BID  . isosorbide mononitrate  60 mg Oral Daily  . metoprolol succinate  25 mg Oral BID  . pregabalin  75 mg Oral BID   . sodium chloride flush  3 mL Intravenous Q12H   Continuous Infusions:  PRN Meds:.acetaminophen **OR** acetaminophen, [DISCONTINUED] ondansetron **OR** ondansetron (ZOFRAN) IV  Micro Results Recent Results (from the past 240 hour(s))  Blood culture (routine x 2)     Status: None (Preliminary result)   Collection Time: 12/20/2015 10:05 PM  Result Value Ref Range Status   Specimen Description BLOOD RIGHT HAND  Final   Special Requests BOTTLES DRAWN AEROBIC ONLY 5CC  Final   Culture NO GROWTH < 12 HOURS  Final   Report Status PENDING  Incomplete  Blood culture (routine x 2)     Status: None (Preliminary result)   Collection Time: 12/07/2015 10:12 PM  Result Value Ref Range Status   Specimen Description BLOOD RIGHT FOREARM  Final   Special Requests BOTTLES DRAWN AEROBIC ONLY 5CC  Final   Culture NO GROWTH < 12 HOURS  Final   Report Status PENDING  Incomplete    Radiology Reports Dg Chest 2 View  12/30/2015  CLINICAL DATA:  Shortness of breath for a few days. Generalize weakness. EXAM: CHEST  2 VIEW COMPARISON:  None. FINDINGS: There is complete opacification of the left hemi thorax. In the absence of prior surgery, this could represent either or a combination of diffuse consolidation, large pleural effusion, and atelectasis. Volume loss on the left with mild hyperinflation on the right. There is a moderate-sized right pleural effusion. There is focal infiltration in the right lower lung which could represent pneumonia or atelectasis. Heart size is obscured  by the parenchymal process. No definite vascular congestion on the right. Calcification of the aorta. Degenerative changes in the spine. Cardiac pacemaker. IMPRESSION: Complete opacification of the left hemi thorax. Moderate size right pleural effusion with basilar atelectasis or infiltration. Electronically Signed   By: Lucienne Capers M.D.   On: 12/26/2015 21:56   Ct Chest Wo Contrast  12/28/2015  CLINICAL DATA:  Shortness of breath for 2  days and weakness. EXAM: CT CHEST WITHOUT CONTRAST TECHNIQUE: Multidetector CT imaging of the chest was performed following the standard protocol without IV contrast. COMPARISON:  Chest radiograph 12/26/2015 FINDINGS: Examination is technically limited due streak artifact arising from the cardiac pacemaker in from the patient's arms. Cardiac enlargement. Coronary artery calcifications. Mildly dilated ascending thoracic aorta at 3.5 cm diameter. Aortic calcifications are present. Prominent central pulmonary arteries may represent arterial hypertension. Calcified granuloma in the right lung with calcified right hilar lymph nodes. Enlarged pretracheal lymph node measures 1.3 cm short axis dimension. Diffusely enlarged and heterogeneous thyroid gland with scattered calcifications and calcified nodules. Consider follow-up with elective ultrasound for further evaluation. Esophagus is decompressed. There is a large left pleural effusion with complete collapse and consolidation of the left lung. Material is present throughout the left mainstem and visualize central left bronchi. This is likely due to secretions. An endobronchial obstructing lesion is not excluded. Moderate size right pleural effusion with atelectasis or consolidation in the right lung base. Patchy airspace disease throughout the right lung. No pneumothorax. Included portions of the upper abdominal organs demonstrate surgical absence of the gallbladder. Degenerative changes throughout the spine. No destructive bone lesions. IMPRESSION: Large left pleural effusion with complete collapse and consolidation of the left lung. Secretions filled the left bronchi. Changes likely represent consolidated pneumonia although endobronchial obstructing lesion is not excluded. Moderate right pleural effusion with atelectasis and infiltration in the right lung. Diffusely enlarged and heterogeneous nodular thyroid gland with calcified nodules. Electronically Signed   By:  Lucienne Capers M.D.   On: 12/28/2015 03:38    Time Spent in minutes  30   Tayvion Lauder K M.D on 12/28/2015 at 10:54 AM  Between 7am to 7pm - Pager - [phone]  After 7pm go to [email or web address] - password [facility]  [facility] -  Office  [phone]

## 2015-12-28 NOTE — Progress Notes (Signed)
PT Cancellation Note  Patient Details Name: Yolanda Spears MRN: [id number] DOB: 07/09/1935   Cancelled Treatment:    Reason Eval/Treat Not Completed: Patient at procedure or test/unavailable (pt OOR for procedure and unavailable at this time)   Melford Aase 12/28/2015, 2:13 PM Elwyn Reach, [place]

## 2015-12-28 NOTE — Progress Notes (Signed)
ANTICOAGULATION CONSULT NOTE - Initial Consult  Pharmacy Consult for Warfarin Indication: atrial fibrillation  Allergies  Allergen Reactions  . Penicillins Hives  . Shellfish Allergy   . Statins     Patient Measurements: Height: 5\' 6"  (167.6 cm) Weight: 151 lb 10.8 oz (68.8 kg) IBW/kg (Calculated) : 59.3  Vital Signs: Temp: 98 F (36.7 C) (04/22 1225) Temp Source: Oral (04/22 1225) BP: 122/38 mmHg (04/22 1225) Pulse Rate: 61 (04/22 1225)  Labs:  Recent Labs  12/15/2015 2213 12/28/15 0149 12/28/15 0353 12/28/15 0713  HGB 10.9* 10.2*  --   --   HCT 36.8 34.0*  --   --   PLT 220 179  --   --   LABPROT >90.0* 66.9*  --  20.7*  INR >10.00* 8.48*  --  1.79*  CREATININE 1.95*  --  1.91*  --     Estimated Creatinine Clearance: 22.7 mL/min (by C-G formula based on Cr of 1.91).   Medical History: Past Medical History  Diagnosis Date  . CHF (congestive heart failure) ([place])   . Breast cancer ([place])   . Essential hypertension   . HLD (hyperlipidemia)   . Systolic congestive heart failure ([place])   . A-fib ([place])   . CKD (chronic kidney disease), stage III   . Stroke (cerebrum) ([place])   . C. difficile colitis     Medications:  Scheduled:  . amiodarone  200 mg Oral Daily  . calcium carbonate  1 tablet Oral Daily  . cefTRIAXone (ROCEPHIN)  IV  1 g Intravenous Q24H  . cholecalciferol  1,000 Units Oral Q T,Th,S,Su  . ezetimibe  5 mg Oral QPM  . furosemide  40 mg Intravenous Q12H  . hydrALAZINE  50 mg Oral BID  . isosorbide mononitrate  60 mg Oral Daily  . metoprolol succinate  25 mg Oral BID  . pregabalin  75 mg Oral BID  . sodium chloride flush  3 mL Intravenous Q12H    Assessment: 80 yoF on 12/25/2015 with UTI.On warfarin PTA for Afib. INR >10 on admission. Pt was given IV Vit K and FFP. INR now 1.79. Plan for left-sided paracentesis on this afternoon. Pharmacy consulted to restart patient's warfarin. Given the patient came in supratherapeutic will dose lower than  PTA dosing  PTA warfarin 2.5mg  on Wednesdays and 1.25 all other days.   Goal of Therapy:  INR 2-3 Monitor platelets by anticoagulation protocol: Yes   Plan:  Warfarin 1mg  tonight Daily INR  Darl Pikes, PharmD Clinical Pharmacist- Resident Pager: [phone]  Darl Pikes 12/28/2015,12:31 PM

## 2015-12-28 NOTE — Consult Note (Signed)
PULMONARY / CRITICAL CARE MEDICINE   Name: Yolanda Spears MRN: [id number] DOB: 1935-01-07    ADMISSION DATE:  01/03/2016 CONSULTATION DATE:  12/28/15  REFERRING MD:  [facility]  CHIEF COMPLAINT:  Sob/acute resp failure   HISTORY OF PRESENT ILLNESS:   18 yowf with known chf/af/ mod AS d/c home 2 weeks pta with documented bilateral pleural effusions  with gradual worsening fatigue/ sob/ then ams > admit 4/21 with much larger effusions L>R with secondary atx on L and excess INR > pccm asked to see am 4/22.  Note Pt is DNR status   From last d/[facility] summary [facility]rom [facility] (charts have not been merged) Admit date: 12/10/2015 Discharge date: 12/14/2015  Recommendations for Outpatient Follow-up:  1. Repeat BMET to follow electrolytes and renal failure 2. Please reassess oxygen supplementation needs 3. Reassess BP and adjust medications as needed  Discharge Diagnoses:  Active Problems:  Pacemaker- Dual-chamber-mdt  Paroxysmal atrial fibrillation (HCC)  CKD (chronic kidney disease) stage 4, GFR 15-29 ml/min (HCC)  Aortic stenosis  Acute systolic CHF (congestive heart failure) (HCC)  Persistent atrial fibrillation ([place])   Discharge Condition: stable and improved. Discharge home with [facility] services and oxygen supplementation. Follow up with PCP in 10 days and with cardiology service (office will set up appointment)   Diet recommendation: heart healthy diet  Filed Weights   12/12/15 0507 12/13/15 0654 12/14/15 [id number]  Weight: 67 kg (147 lb 11.3 oz) 67.1 kg (147 lb 14.9 oz) 70.398 kg (155 lb 3.2 oz)    History of present illness:  As per H&P written on 12/10/15 by Dr. Sloan Leiter 81 y.o. female with a Past Medical History of chronic systolic heart failure, history of atrial fibrillation on Coumadin, history of tachybradycardia syndrome is s/p permanent pacemaker implantation who presents today with the above-noted complaints. Per patient she was in her usual state of health yesterday, and when to  sleep in a usual manner. She woke up around 3:00 this morning short of breath. She denies any exertional dyspnea. She denies PND, but has a chronic 4 pillow orthopnea at baseline. After an hour or so, she went back to sleep, but woke up later in the morning with continued shortness of breath. She was seen by her home health RN with device her to come to the emergency room. In the ED, she was felt to have congestive heart failure, and given 120 mg of IV Lasix. By the time I saw the patient she feels significantly better than her initial presentation. She denies any fever, cough, chest pain, nausea, vomiting or abdominal pain. She is now being admitted for further evaluation and treatment.  Hospital Course:  Acute on Chronic systolic CHF (congestive heart failure) ([place]): -appears to be secondary to exacerbation of PAF. Patient also with AS  -Continue with lasix now PO at 60mg  BID as per cardiology rec's, strict low sodium diet and daily weights -s/p TEE/cardioversion today (4/7) -Patient with elevated troponin (demand ischemia form CHF and A. Fib) cardiology input appreciated -overall improved and with stable breathing now; still requiring oxygen supplementation with exertion. Oxygen for home arranged at discharge  Aortic stenosis: - mod by 2D ECHO 11/10/15.  - continue medical management and outpatient follow up with cardiology  CKD (chronic kidney disease) stage 4, GFR 15-29 ml/min (HCC) -stable overall -will follow trend, especially with adjustment on diuretics   Paroxysmal atrial fibrillation ([place]): with persistent A. fib -S/P Pacemaker- Dual-chamber-mdt -per cardiology continue metoprolol and also amiodarone -on coumadin for CHADSVASC score of 7 (  CHF, HTN, Age, F sex, CVA) -s/p cardioversion today 4/7; will follow with cardiology service as an outpatient.  -Currently SR  supra-therapeutic INR: -continue coumadin -INR therapeutic at 2.49 on day of discharge  physical  deconditioning:  - plan is to pursuit home health physical therapy at discharge  -[facility] services arranged  SSS s/p MDT PPM:  - Device functioning normally. -currently after cardioversion in SR  Procedures: ECHO: TEE and cardioversion planned for 12/13/15 > canceled   Consultations:  Cardiology        Per patient's son, patient has worsening shortness of breath 4/21. Patient does not have cough, fever, chills or chest pain. She has generalized weakness. Patient is confused and disoriented today. Patient does not have unilateral weakness per her son. Patient has chronic mild diarrhea at baseline, 1-2 bowel movements each day, which has not changed. Patient does not have nausea, vomiting or abdominal pain. Not sure if patient has symptoms of UTI. No active bleeding.  ED Course: pt was found to have supratherapeutic INR>10, lactate 1.49, negative troponin, BNP 539, positive urinalysis with large amount of leukocytes, stable renal function, temperature normal, no tachycardia, oxygen desaturation to 66%, which improved to 100% on nasal cannula oxygen. Chest x-ray showed complete opacification of the left hemi thorax; moderate size right pleural effusion with basilar atelectasis or infiltration. Patient is admitted to inpatient for further eval and treatment.  Denies cp or sob at present.   PAST MEDICAL HISTORY :  She  has a past medical history of CHF (congestive heart failure) ([place]); Breast cancer ([place]); Essential hypertension; HLD (hyperlipidemia); Systolic congestive heart failure ([place]); A-fib ([place]); CKD (chronic kidney disease), stage III; Stroke (cerebrum) (HCC); and C. difficile colitis.  PAST SURGICAL HISTORY: She  has past surgical history that includes Mastectomy and Breast surgery.  Allergies  Allergen Reactions  . Penicillins Hives  . Shellfish Allergy   . Statins     No current facility-administered medications on file prior to encounter.   No current outpatient  prescriptions on file prior to encounter.    FAMILY HISTORY:  Her has no family status information on file.   SOCIAL HISTORY: She  reports that she has never smoked. She does not have any smokeless tobacco history on file. She reports that she does not drink alcohol or use illicit drugs.  REVIEW OF SYSTEMS:   Per home health 12/26/15 Constitutional: Positive for malaise/fatigue.   Easily fatigued/ exhausted HENT: Positive for congestion.   Nasal congestion, teary eyes, runny nose  Eyes: Negative.   Wears eyeglasses for reading  Respiratory: Positive for sputum production and shortness of breath. Negative for cough and wheezing.   Patient gets easily fatigued and short of breath with talking and exertion Diminished lung sounds to left lobe Home oxygen at 2 liters per minute per nasal cannula Propped up on 4 pillows (baseline) to breath better Whitish phlegm expectorated per patient  Cardiovascular: Positive for leg swelling. Negative for chest pain.   Bilateral ankle/feet swelling but non-pitting  Gastrointestinal: Positive for diarrhea. Negative for nausea, vomiting and abdominal pain.   History of diarrhea since after GI surgery per patient Bowel sounds positive Abdomen soft, non-tender Poor appetite  Genitourinary:   Urine incontinence- wears pads  Musculoskeletal: Negative. Negative for falls.  Skin:   Abrasion to sacral area- with hydrocolloid dressing  Neurological:   Involuntary shakiness to hands  Endo/Heme/Allergies: Bruises/bleeds easily.  Psychiatric/Behavioral: Negative  SUBJECTIVE:  Does not verbalize specific complaints   VITAL SIGNS: BP 140/65  mmHg  Pulse 70  Temp(Src) 98 F (36.7 C) (Oral)  Resp 18  Ht 5\' 6"  (1.676 m)  Wt 151 lb 10.8 oz (68.8 kg)  BMI 24.49 kg/m2  SpO2 98%  HEMODYNAMICS:    VENTILATOR SETTINGS:    INTAKE / OUTPUT: I/O last 3 completed shifts: In: 583 [I.V.:50; Blood:483; IV  Piggyback:50] Out: 130 [Urine:130]  PHYSICAL EXAMINATION: General:  Acute and chronically ill and about 10 y > stated age, responds by repeating but not processing Neuro: speech is intelligle but basically just perseverating/repeating/ non focal HEENT:  mucuosa dry Cardiovascular:  IRIR with III/VI sem and 2+ pitting LE edema  Lungs:  Scattered insp / exp rhonchi bilaterally reduced bs and dullness L >> R  Abdomen:  soft Musculoskeletal:  Some muscle wasting  Skin:  Stage II sacral decub per nursing notes   LABS:  BMET  Recent Labs Lab 12/30/2015 2213 12/28/15 0353  NA 141 141  K 4.1 3.7  CL 97* 96*  CO2 28 32  BUN 47* 49*  CREATININE 1.95* 1.91*  GLUCOSE 107* 133*    Electrolytes  Recent Labs Lab 12/07/2015 2213 12/28/15 0353  CALCIUM 9.4 9.2    CBC  Recent Labs Lab 12/23/2015 2213 12/28/15 0149  WBC 7.3 7.4  HGB 10.9* 10.2*  HCT 36.8 34.0*  PLT 220 179    Coag's  Recent Labs Lab 12/09/2015 2213 12/28/15 0149 12/28/15 0713  INR >10.00* 8.48* 1.79*    Sepsis Markers  Recent Labs Lab 12/20/2015 2223 12/28/15 0157  LATICACIDVEN 1.49 1.10    ABG  Recent Labs Lab 12/28/15 0810  PHART 7.314*  PCO2ART 72.9*  PO2ART 108*    Liver Enzymes  Recent Labs Lab 12/10/2015 2213 12/28/15 0353  AST 18 18  ALT 16 15  ALKPHOS 79 73  BILITOT 0.8 0.9  ALBUMIN 2.7* 2.8*    Cardiac Enzymes No results for input(s): TROPONINI, PROBNP in the last 168 hours.  Glucose No results for input(s): GLUCAP in the last 168 hours.  Imaging Dg Chest 2 View  12/31/2015  CLINICAL DATA:  Shortness of breath for a few days. Generalize weakness. EXAM: CHEST  2 VIEW COMPARISON:  None. FINDINGS: There is complete opacification of the left hemi thorax. In the absence of prior surgery, this could represent either or a combination of diffuse consolidation, large pleural effusion, and atelectasis. Volume loss on the left with mild hyperinflation on the right. There is a  moderate-sized right pleural effusion. There is focal infiltration in the right lower lung which could represent pneumonia or atelectasis. Heart size is obscured by the parenchymal process. No definite vascular congestion on the right. Calcification of the aorta. Degenerative changes in the spine. Cardiac pacemaker. IMPRESSION: Complete opacification of the left hemi thorax. Moderate size right pleural effusion with basilar atelectasis or infiltration. Electronically Signed   By: Lucienne Capers M.D.   On: 12/12/2015 21:56   Ct Chest Wo Contrast  12/28/2015  CLINICAL DATA:  Shortness of breath for 2 days and weakness. EXAM: CT CHEST WITHOUT CONTRAST TECHNIQUE: Multidetector CT imaging of the chest was performed following the standard protocol without IV contrast. COMPARISON:  Chest radiograph 12/21/2015 FINDINGS: Examination is technically limited due streak artifact arising from the cardiac pacemaker in from the patient's arms. Cardiac enlargement. Coronary artery calcifications. Mildly dilated ascending thoracic aorta at 3.5 cm diameter. Aortic calcifications are present. Prominent central pulmonary arteries may represent arterial hypertension. Calcified granuloma in the right lung with calcified right hilar lymph nodes.  Enlarged pretracheal lymph node measures 1.3 cm short axis dimension. Diffusely enlarged and heterogeneous thyroid gland with scattered calcifications and calcified nodules. Consider follow-up with elective ultrasound for further evaluation. Esophagus is decompressed. There is a large left pleural effusion with complete collapse and consolidation of the left lung. Material is present throughout the left mainstem and visualize central left bronchi. This is likely due to secretions. An endobronchial obstructing lesion is not excluded. Moderate size right pleural effusion with atelectasis or consolidation in the right lung base. Patchy airspace disease throughout the right lung. No pneumothorax.  Included portions of the upper abdominal organs demonstrate surgical absence of the gallbladder. Degenerative changes throughout the spine. No destructive bone lesions. IMPRESSION: Large left pleural effusion with complete collapse and consolidation of the left lung. Secretions filled the left bronchi. Changes likely represent consolidated pneumonia although endobronchial obstructing lesion is not excluded. Moderate right pleural effusion with atelectasis and infiltration in the right lung. Diffusely enlarged and heterogeneous nodular thyroid gland with calcified nodules. Electronically Signed   By: Lucienne Capers M.D.   On: 12/28/2015 03:38       ASSESSMENT / PLAN:  1) acute hypercarbic and hypoxemia resp failure in pt with large effusions L > R and prev excess INR now corrected. - Rec tap L side asap - ATX on L is likely related to passive relaxation, not airway obstruction  - Note DNR status   2) AMS likely due to hypercarbia which should improve p thoracentesis/ not a good candidate for Bipap  And note she actually is well compensated in term of acid /base status which actually helps lower her Ve demand (the higher the buffer the better here)    3) underlying CHF/ moderate AS/ CAF  Noted plus worsening renal failure > DNR status approp here as the underlying problem cannot be addressed medically and not a candidate for HD   The patient is critically ill with multiple organ systems failure and requires high complexity decision making for assessment and support, frequent evaluation and titration of therapies, application of advanced monitoring technologies and extensive interpretation of multiple databases. Critical Care Time devoted to patient care services described in this note is 45 minutes.      Christinia Gully, MD Pulmonary and [place] [phone] After 5:30 PM or weekends, call [phone]

## 2015-12-28 NOTE — Progress Notes (Signed)
Pt is given 4 units of plasma total, tolerated well.

## 2015-12-29 DIAGNOSIS — J9611 Chronic respiratory failure with hypoxia: Secondary | ICD-10-CM | POA: Diagnosis present

## 2015-12-29 DIAGNOSIS — G9340 Encephalopathy, unspecified: Secondary | ICD-10-CM

## 2015-12-29 DIAGNOSIS — J9612 Chronic respiratory failure with hypercapnia: Secondary | ICD-10-CM

## 2015-12-29 LAB — CBC
HCT: 31.8 % — ABNORMAL LOW (ref 36.0–46.0)
Hemoglobin: 9.2 g/dL — ABNORMAL LOW (ref 12.0–15.0)
MCH: 28.3 pg (ref 26.0–34.0)
MCHC: 28.9 g/dL — ABNORMAL LOW (ref 30.0–36.0)
MCV: 97.8 fL (ref 78.0–100.0)
PLATELETS: 148 10*3/uL — AB (ref 150–400)
RBC: 3.25 MIL/uL — ABNORMAL LOW (ref 3.87–5.11)
RDW: 15 % (ref 11.5–15.5)
WBC: 8.1 10*3/uL (ref 4.0–10.5)

## 2015-12-29 LAB — BASIC METABOLIC PANEL
ANION GAP: 15 (ref 5–15)
BUN: 57 mg/dL — ABNORMAL HIGH (ref 6–20)
CALCIUM: 9.2 mg/dL (ref 8.9–10.3)
CO2: 33 mmol/L — ABNORMAL HIGH (ref 22–32)
Chloride: 97 mmol/L — ABNORMAL LOW (ref 101–111)
Creatinine, Ser: 1.93 mg/dL — ABNORMAL HIGH (ref 0.44–1.00)
GFR, EST AFRICAN AMERICAN: 28 mL/min — AB (ref >60)
GFR, EST NON AFRICAN AMERICAN: 24 mL/min — AB (ref >60)
GLUCOSE: 86 mg/dL (ref 65–99)
Potassium: 3.8 mmol/L (ref 3.5–5.1)
Sodium: 145 mmol/L (ref 135–145)

## 2015-12-29 LAB — PROTIME-INR
INR: 1.22 (ref 0.00–1.49)
Prothrombin Time: 15.6 seconds — ABNORMAL HIGH (ref 11.6–15.2)

## 2015-12-29 LAB — PREPARE FRESH FROZEN PLASMA
UNIT DIVISION: 0
UNIT DIVISION: 0
Unit division: 0
Unit division: 0

## 2015-12-29 MED ORDER — MORPHINE SULFATE (PF) 2 MG/ML IV SOLN
1.0000 mg | INTRAVENOUS | Status: AC | PRN
  Administered 2015-12-29 – 2015-12-30 (×3): 1 mg via INTRAVENOUS
  Filled 2015-12-29 (×3): qty 1

## 2015-12-29 MED ORDER — WARFARIN SODIUM 2 MG PO TABS: 2.0000 mg | ORAL_TABLET | Freq: Once | ORAL | Status: DC

## 2015-12-29 MED ORDER — TRAMADOL HCL 50 MG PO TABS: 50.0000 mg | ORAL_TABLET | Freq: Once | ORAL | Status: DC

## 2015-12-29 MED ORDER — APIXABAN 2.5 MG PO TABS: 2.5000 mg | ORAL_TABLET | Freq: Two times a day (BID) | ORAL | Status: DC

## 2015-12-29 NOTE — Discharge Instructions (Signed)

## 2015-12-29 NOTE — Progress Notes (Signed)
PT Cancellation Note  Patient Details Name: Yolanda Spears MRN: [id number] DOB: [birth year]   Cancelled Treatment:    Reason Eval/Treat Not Completed: Other (comment)   Discussed pt with RN, Palma Holter, who advised to hold PT today as she is quite fatigued and lethargic;   Will follow-up for appropriateness of PT tomorrow;   Thanks,  Roney Marion, PT  Acute Rehabilitation Services Pager [phone] Office [phone]    Roney Marion [facility] 12/29/2015, 9:25 AM

## 2015-12-29 NOTE — Progress Notes (Signed)
PROGRESS NOTE                                                                                                                                                                                                             Patient Demographics:    Yolanda Spears, is a 81 y.o. female, DOB - 10/20/1934, [id number]  Admit date - 12/22/2015   Admitting Physician Ivor Costa, MD  Outpatient Primary MD for the patient is No primary care provider on file.  LOS - 1  Outpatient Specialists:   Chief Complaint  Patient presents with  . Shortness of Breath       Brief Narrative    Yolanda Spears is a 81 y.o. female with medical history significant of hypertension, diet-controlled diabetes, systolic congestive heart failure with EF of 35-40 percent, breast cancer, atrial fibrillation on Coumadin, chronic kidney disease-stage III, stroke, C. difficile colitis, pacemaker placement, who presents with altered mental status and the shortness breath.  Per patient's son, patient has worsening shortness of breath today. Patient does not have cough, fever, chills or chest pain. She has generalized weakness. Patient is confused and disoriented today. Patient does not have unilateral weakness per her son. Patient has chronic mild diarrhea at baseline, 1-2 bowel movements each day, which has not changed. Patient does not have nausea, vomiting or abdominal pain. Not sure if patient has symptoms of UTI. No active bleeding.  ED Course: pt was found to have supratherapeutic INR>10, lactate 1.49, negative troponin, BNP 539, positive urinalysis with large amount of leukocytes, stable renal function, temperature normal, no tachycardia, oxygen desaturation to 66%, which improved to 100% on nasal cannula oxygen. Chest x-ray showed complete opacification of the left hemi thorax; moderate size right pleural effusion with basilar atelectasis or infiltration. Patient is  admitted to inpatient for further eval and treatment.  # pt has two medical record, another one is [id number]      Subjective:    Nitya Molyneaux today has, No headache, No chest pain, No abdominal pain - No Nausea, No new weakness tingling or numbness, No Cough but +ve SOB.     Assessment  & Plan :     1. Acute hypoxic and hypercapnic respiratory failure. Pulmonary consulted, patient is DO NOT RESUSCITATE, majority of the problem appears to be secondary to left-sided large pleural effusion. She is  afebrile, no leukocytosis, stop antibiotics, continue diuresis for CHF and pleural effusion, She underwent diagnostic and therapeutic left-sided paracentesis on 12/28/2015 with 1.5 L of transudative fluid removed.  Continue supportive care with oxygen and nebulizer treatments. Poor BiPAP candidate due to generalized weakness, advanced age and mild somnolent status.  Long-term prognosis does not appear good. Had detailed discussion with patient's son on 12/28/2015, he wants to pursue palliative care which will be provided, if she declines further we will arrange for full comfort care and hospice. For now DO NOT RESUSCITATE with gentle medical treatment.  2. Acute encephalopathy. Likely combination of hypercapnia and initial hypoxia. No focal deficits or headache, supportive care as above and monitor. Minimize narcotics and benzodiazepines. Speech consulted. Currently nothing by mouth.  3.UTI. Taper down to Rocephin, monitor cultures, not septic.  4. Acute on chronic systolic heart failure. Recent EF 35%. IV Lasix, BNP is 540, continue beta blocker & BiDil, due to renal insufficiency no ACE/ARB. Monitor.  5. Chronic atrial fibrillation with [place] vasc 2 score of 8. Goal will be rate control, continue beta blocker and amiodarone, will switch her from Coumadin to Eliquis as her INR was poorly controlled and for better lifestyle.  6. Essential hypertension. Continue BiDil and beta blocker.  7. CK D  stage IV. Baseline creatinine close to 1.8. Monitor with diuresis.  8. History of stroke. Switched her to Eliquis from Coumadin for secondary prevention.  9. Stage II sacral decubitus ulcer. Wound care consulted.    Code Status : DNR, prognosis poor. His cussed with son in detail on 12/28/2015, he does not want heroics. Palliative care will be consulted. If she declines further for hospice.  Family Communication  : None  Disposition Plan  : Stay inpatient  Barriers For Discharge : Acute hypoxic and hypercapnic respiratory failure  Consults  :  PCCM, IR, palliative care  Procedures  :   Left-sided thoracentesis ordered on 12/28/2015 by pulmonary  DVT Prophylaxis  :  Coumadin/SCDs  Lab Results  Component Value Date   PLT 148* 12/29/2015    Antibiotics  :   Anti-infectives    Start     Dose/Rate Route Frequency Ordered Stop   12/28/15 1115  cefTRIAXone (ROCEPHIN) 1 g in dextrose 5 % 50 mL IVPB     1 g 100 mL/hr over 30 Minutes Intravenous Every 24 hours 12/28/15 1106 12/31/15 1114   12/28/15 0200  aztreonam (AZACTAM) 500 mg in dextrose 5 % 50 mL IVPB  Status:  Discontinued     500 mg 100 mL/hr over 30 Minutes Intravenous Every 8 hours 12/28/15 0140 12/28/15 1105   12/28/2015 2245  vancomycin (VANCOCIN) IVPB 1000 mg/200 mL premix     1,000 mg 200 mL/hr over 60 Minutes Intravenous STAT 12/25/2015 2239 12/28/15 0106   12/10/2015 2245  ceFEPIme (MAXIPIME) 1 g in dextrose 5 % 50 mL IVPB     1 g 100 mL/hr over 30 Minutes Intravenous STAT 12/10/2015 2242 12/30/2015 2354        Objective:   Filed Vitals:   12/28/15 2054 12/29/15 0511 12/29/15 0800 12/29/15 1144  BP: 117/36 118/39 140/50 154/41  Pulse: 65 65 71 63  Temp: 98.6 F (37 C) 98.7 F (37.1 C)  98.3 F (36.8 C)  TempSrc: Oral Oral  Oral  Resp: 18 18 18 18   Height:      Weight:      SpO2: 100% 99% 97% 98%    Wt Readings from Last 3 Encounters:  12/28/15  68.8 kg (151 lb 10.8 oz)     Intake/Output Summary (Last  24 hours) at 12/29/15 1159 Last data filed at 12/29/15 0900  Gross per 24 hour  Intake    248 ml  Output    700 ml  Net   -452 ml     Physical Exam  Mildly somnolent, No new F.N deficits, Normal affect [place], AT,PERRAL Supple Neck,No JVD, No cervical lymphadenopathy appriciated.  Symmetrical Chest wall movement, reduced L sided B sounds RRR,No Gallops,Rubs or new Murmurs, No Parasternal Heave +ve B.Sounds, Abd Soft, No tenderness, No organomegaly appriciated, No rebound - guarding or rigidity. No Cyanosis, Clubbing or edema, No new Rash or bruise     Data Review:    CBC  Recent Labs Lab 01/04/2016 2213 12/28/15 0149 12/29/15 0326  WBC 7.3 7.4 8.1  HGB 10.9* 10.2* 9.2*  HCT 36.8 34.0* 31.8*  PLT 220 179 148*  MCV 98.1 95.8 97.8  MCH 29.1 28.7 28.3  MCHC 29.6* 30.0 28.9*  RDW 14.8 14.8 15.0  LYMPHSABS 0.3*  --   --   MONOABS 0.4  --   --   EOSABS 0.0  --   --   BASOSABS 0.0  --   --     Chemistries   Recent Labs Lab 12/12/2015 2213 12/28/15 0353 12/28/15 1139 12/29/15 0326  NA 141 141  --  145  K 4.1 3.7  --  3.8  CL 97* 96*  --  97*  CO2 28 32  --  33*  GLUCOSE 107* 133*  --  86  BUN 47* 49*  --  57*  CREATININE 1.95* 1.91*  --  1.93*  CALCIUM 9.4 9.2  --  9.2  AST 18 18 25   --   ALT 16 15 13*  --   ALKPHOS 79 73 68  --   BILITOT 0.8 0.9 1.3*  --    ------------------------------------------------------------------------------------------------------------------ No results for input(s): CHOL, HDL, LDLCALC, TRIG, CHOLHDL, LDLDIRECT in the last 72 hours.  No results found for: HGBA1C ------------------------------------------------------------------------------------------------------------------ No results for input(s): TSH, T4TOTAL, T3FREE, THYROIDAB in the last 72 hours.  Invalid input(s): FREET3 ------------------------------------------------------------------------------------------------------------------ No results for input(s): VITAMINB12,  FOLATE, FERRITIN, TIBC, IRON, RETICCTPCT in the last 72 hours.  Coagulation profile  Recent Labs Lab 12/08/2015 2213 12/28/15 0149 12/28/15 0713 12/29/15 0326  INR >10.00* 8.48* 1.79* 1.22    No results for input(s): DDIMER in the last 72 hours.  Cardiac Enzymes No results for input(s): CKMB, TROPONINI, MYOGLOBIN in the last 168 hours.  Invalid input(s): CK ------------------------------------------------------------------------------------------------------------------    Component Value Date/Time   BNP 539.0* 12/29/2015 2213    Inpatient Medications  Scheduled Meds: . amiodarone  200 mg Oral Daily  . calcium carbonate  1 tablet Oral Daily  . cefTRIAXone (ROCEPHIN)  IV  1 g Intravenous Q24H  . cholecalciferol  1,000 Units Oral Q T,Th,S,Su  . ezetimibe  5 mg Oral QPM  . furosemide  40 mg Intravenous Q12H  . hydrALAZINE  50 mg Oral BID  . isosorbide mononitrate  60 mg Oral Daily  . metoprolol succinate  25 mg Oral BID  . pregabalin  75 mg Oral BID  . sodium chloride flush  3 mL Intravenous Q12H   Continuous Infusions:  PRN Meds:.acetaminophen **OR** acetaminophen, [DISCONTINUED] ondansetron **OR** ondansetron (ZOFRAN) IV  Micro Results Recent Results (from the past 240 hour(s))  Blood culture (routine x 2)     Status: None (Preliminary result)   Collection Time: 12/29/2015 10:05  PM  Result Value Ref Range Status   Specimen Description BLOOD RIGHT HAND  Final   Special Requests BOTTLES DRAWN AEROBIC ONLY 5CC  Final   Culture NO GROWTH < 12 HOURS  Final   Report Status PENDING  Incomplete  Blood culture (routine x 2)     Status: None (Preliminary result)   Collection Time: 12/23/2015 10:12 PM  Result Value Ref Range Status   Specimen Description BLOOD RIGHT FOREARM  Final   Special Requests BOTTLES DRAWN AEROBIC ONLY 5CC  Final   Culture NO GROWTH < 12 HOURS  Final   Report Status PENDING  Incomplete  Gram stain     Status: None   Collection Time: 12/28/15  2:54 PM   Result Value Ref Range Status   Specimen Description FLUID PLEURAL LEFT  Final   Special Requests NONE  Final   Gram Stain NO WBC SEEN NO ORGANISMS SEEN   Final   Report Status 12/28/2015 FINAL  Final    Radiology Reports Dg Chest 1 View  12/28/2015  CLINICAL DATA:  Post left sided thoracentesis. Hx CHF, breast CA. Pt not very responsive, hardly able to be awakened. Unable to provide any hx or current symptoms EXAM: CHEST 1 VIEW COMPARISON:  Chest CT obtained today. Prior chest radiograph, 12/12/2015. FINDINGS: There has been a significant reduction in fluid following left-sided thoracentesis. Small to moderate pleural effusion persists at the lung base. Left lung has expanded. No pneumothorax evident on this supine study. Hazy airspace opacity is noted throughout most of the right lung sparing the apex. Persistent moderate right pleural effusion. IMPRESSION: 1. No evidence of a pneumothorax following thoracentesis. 2. Significant decreased fluid on the left with partial re-expansion of the left lung. 3. New hazy airspace opacity in the right lung. This is consistent with layering pleural fluid and atelectasis, which was present on the CT. Electronically Signed   By: Lajean Manes M.D.   On: 12/28/2015 15:23   Dg Chest 2 View  12/15/2015  CLINICAL DATA:  Shortness of breath for a few days. Generalize weakness. EXAM: CHEST  2 VIEW COMPARISON:  None. FINDINGS: There is complete opacification of the left hemi thorax. In the absence of prior surgery, this could represent either or a combination of diffuse consolidation, large pleural effusion, and atelectasis. Volume loss on the left with mild hyperinflation on the right. There is a moderate-sized right pleural effusion. There is focal infiltration in the right lower lung which could represent pneumonia or atelectasis. Heart size is obscured by the parenchymal process. No definite vascular congestion on the right. Calcification of the aorta. Degenerative  changes in the spine. Cardiac pacemaker. IMPRESSION: Complete opacification of the left hemi thorax. Moderate size right pleural effusion with basilar atelectasis or infiltration. Electronically Signed   By: Lucienne Capers M.D.   On: 12/26/2015 21:56   Ct Chest Wo Contrast  12/28/2015  CLINICAL DATA:  Shortness of breath for 2 days and weakness. EXAM: CT CHEST WITHOUT CONTRAST TECHNIQUE: Multidetector CT imaging of the chest was performed following the standard protocol without IV contrast. COMPARISON:  Chest radiograph 12/09/2015 FINDINGS: Examination is technically limited due streak artifact arising from the cardiac pacemaker in from the patient's arms. Cardiac enlargement. Coronary artery calcifications. Mildly dilated ascending thoracic aorta at 3.5 cm diameter. Aortic calcifications are present. Prominent central pulmonary arteries may represent arterial hypertension. Calcified granuloma in the right lung with calcified right hilar lymph nodes. Enlarged pretracheal lymph node measures 1.3 cm short axis dimension.  Diffusely enlarged and heterogeneous thyroid gland with scattered calcifications and calcified nodules. Consider follow-up with elective ultrasound for further evaluation. Esophagus is decompressed. There is a large left pleural effusion with complete collapse and consolidation of the left lung. Material is present throughout the left mainstem and visualize central left bronchi. This is likely due to secretions. An endobronchial obstructing lesion is not excluded. Moderate size right pleural effusion with atelectasis or consolidation in the right lung base. Patchy airspace disease throughout the right lung. No pneumothorax. Included portions of the upper abdominal organs demonstrate surgical absence of the gallbladder. Degenerative changes throughout the spine. No destructive bone lesions. IMPRESSION: Large left pleural effusion with complete collapse and consolidation of the left lung.  Secretions filled the left bronchi. Changes likely represent consolidated pneumonia although endobronchial obstructing lesion is not excluded. Moderate right pleural effusion with atelectasis and infiltration in the right lung. Diffusely enlarged and heterogeneous nodular thyroid gland with calcified nodules. Electronically Signed   By: Lucienne Capers M.D.   On: 12/28/2015 03:38   US Thoracentesis Asp Pleural Space W/img Guide  12/28/2015  CLINICAL DATA:  Shortness of breath.  Large left pleural effusion. EXAM: EXAM THORACENTESIS WITH ULTRASOUND TECHNIQUE: The procedure, risks (including but not limited to bleeding, infection, organ damage ), benefits, and alternatives were explained to the family. Questions regarding the procedure were encouraged and answered. The family understands and consents to the procedure. Survey ultrasound of the left hemithorax was performed and an appropriate skin entry site was localized. Site was marked, prepped with Betadine, draped in usual sterile fashion, infiltrated locally with 1% lidocaine. The Saf-T-Centesis needle was advanced into the pleural space. Bloody fluid returned. 1.3 L was removed. Samples sent for the requested laboratory studies. The patient tolerated procedure well. COMPLICATIONS: COMPLICATIONS None immediate IMPRESSION: Technically successful ultrasound-guided left thoracentesis. Follow-up chest radiograph pending. Electronically Signed   By: Lucrezia Europe M.D.   On: 12/28/2015 15:12    Time Spent in minutes  30   SINGH,PRASHANT K M.D on 12/29/2015 at 11:59 AM  Between 7am to 7pm - Pager - [phone]  After 7pm go to [email or web address] - password [facility]  [facility] -  Office  [phone]

## 2015-12-29 NOTE — Progress Notes (Signed)
Yolanda Spears is most of the time sleepy, response to voice, on and off open the eyes, looks more alert than yesterday, vitals stable, positioning , mouth care and Foley care continue, ice chips are provided, oxygen continue via [place], will continue to monitor the patient.

## 2015-12-29 NOTE — Progress Notes (Addendum)
[place] for Warfarin>> Apixaban Indication: atrial fibrillation  Allergies  Allergen Reactions  . Penicillins Hives  . Shellfish Allergy   . Statins     Patient Measurements: Height: 5\' 6"  (167.6 cm) Weight:  (pt unable to stand bedscale Erroneous) IBW/kg (Calculated) : 59.3  Vital Signs: Temp: 98.7 F (37.1 C) (04/23 0511) Temp Source: Oral (04/23 0511) BP: 140/50 mmHg (04/23 0800) Pulse Rate: 71 (04/23 0800)  Labs:  Recent Labs  12/30/2015 2213 12/28/15 0149 12/28/15 0353 12/28/15 0713 12/29/15 0326  HGB 10.9* 10.2*  --   --  9.2*  HCT 36.8 34.0*  --   --  31.8*  PLT 220 179  --   --  148*  LABPROT >90.0* 66.9*  --  20.7* 15.6*  INR >10.00* 8.48*  --  1.79* 1.22  CREATININE 1.95*  --  1.91*  --  1.93*    Estimated Creatinine Clearance: 22.5 mL/min (by C-G formula based on Cr of 1.93).   Medical History: Past Medical History  Diagnosis Date  . CHF (congestive heart failure) ([place])   . Breast cancer ([place])   . Essential hypertension   . HLD (hyperlipidemia)   . Systolic congestive heart failure ([place])   . A-fib ([place])   . CKD (chronic kidney disease), stage III   . Stroke (cerebrum) ([place])   . C. difficile colitis     Medications:  Scheduled:  . amiodarone  200 mg Oral Daily  . calcium carbonate  1 tablet Oral Daily  . cefTRIAXone (ROCEPHIN)  IV  1 g Intravenous Q24H  . cholecalciferol  1,000 Units Oral Q T,Th,S,Su  . ezetimibe  5 mg Oral QPM  . furosemide  40 mg Intravenous Q12H  . hydrALAZINE  50 mg Oral BID  . isosorbide mononitrate  60 mg Oral Daily  . metoprolol succinate  25 mg Oral BID  . pregabalin  75 mg Oral BID  . sodium chloride flush  3 mL Intravenous Q12H  . warfarin  1 mg Oral ONCE-1800  . Warfarin - Pharmacist Dosing Inpatient   Does not apply q1800    Assessment: 35 yoF on 12/25/2015 with UTI.On warfarin PTA for Afib. INR >10 on admission. Pt was given IV Vit K and FFP. INR now 1.79.  Left-sided paracentesis performed yesterday. Pharmacy consulted to restart patient's warfarin yesterday. Warfarin was ordered but per [facility] report warfarin was not given due to aspiration risk. Hgb 9.2, plt 148, INR 1.22  PTA warfarin 2.5mg  on Wednesdays and 1.25 all other days. (Note, on this regimen patient came to hospital supratherapeutic)  Goal of Therapy:  INR 2-3 Monitor platelets by anticoagulation protocol: Yes   Plan:  Warfarin 2mg  tonight Daily INR  Darl Pikes, PharmD Clinical Pharmacist- Resident Pager: [phone]  Darl Pikes 12/29/2015,9:05 AM   Addendum Pharmacy consulted to switch pt from warfarin for apixaban. Given CrCl provider would like lower dose.  Plan: Apixaban 2.5mg  bid  Darl Pikes, PharmD Clinical Pharmacist- Resident Pager: [phone]

## 2015-12-29 NOTE — Progress Notes (Signed)
SLP Cancellation Note  Patient Details Name: Yolanda Spears MRN: [id number] DOB: [birth year]   Cancelled treatment:       Reason Eval/Treat Not Completed: Fatigue/lethargy limiting ability to participate. Spoke with RN who reported pt not able to participate today. Will continue attempts.   Blair Heys K 12/29/2015, 2:03 PM  [phone]

## 2015-12-29 NOTE — Progress Notes (Signed)
OT Cancellation Note  Patient Details Name: Yolanda Spears MRN: [id number] DOB: 03/26/1935   Cancelled Treatment:    Reason Eval/Treat Not Completed: Fatigue/lethargy limiting ability to participate;Other (comment) Spoke with RN who advises holding OT today due to pt fatigue/lethargy. Will reattempt OT eval tomorrow.  Hortencia Pilar 12/29/2015, 1:15 PM

## 2015-12-29 NOTE — Progress Notes (Signed)
PULMONARY / CRITICAL CARE MEDICINE   Name: Yolanda Spears MRN: [id number] DOB: 12-31-1934    ADMISSION DATE:  12/15/2015 CONSULTATION DATE:  12/28/15  REFERRING MD:  [facility]  CHIEF COMPLAINT:  Sob/acute resp failure   HISTORY OF PRESENT ILLNESS:   61 yowf with known chf/af/ mod AS d/c home 2 weeks pta with documented bilateral pleural effusions  with gradual worsening fatigue/ sob/ then ams > admit 4/21 with much larger effusions L>R with secondary atx on L and excess INR > pccm asked to see am 4/22.  Note Pt is DNR status   Events: L thoracentesis 4/22 x 1.3 liters > transudative  SUBJECTIVE:  Does not verbalize specific complaints   VITAL SIGNS: BP 140/50 mmHg  Pulse 71  Temp(Src) 98.7 F (37.1 C) (Oral)  Resp 18  Ht 5\' 6"  (1.676 m)  Wt 151 lb 10.8 oz (68.8 kg)  BMI 24.49 kg/m2  SpO2 97%  HEMODYNAMICS:    VENTILATOR SETTINGS:    INTAKE / OUTPUT: I/O last 3 completed shifts: In: 1062 [I.V.:50; Blood:962; IV Piggyback:50] Out: 60 [Urine:830]  PHYSICAL EXAMINATION: General:  Acute and chronically ill and about 10 y > stated age, responds by repeating but not processing Neuro: speech is intelligle but basically just perseverating/repeating/ non focal HEENT:  mucuosa dry Cardiovascular:  IRIR with III/VI sem and 2+ pitting LE edema  Lungs:  Scattered insp / exp rhonchi bilaterally reduced bs and less  dullness L >> R  Abdomen:  soft Musculoskeletal:  Some muscle wasting  Skin:  Stage II sacral decub per nursing notes   LABS:  BMET  Recent Labs Lab 12/10/2015 2213 12/28/15 0353 12/29/15 0326  NA 141 141 145  K 4.1 3.7 3.8  CL 97* 96* 97*  CO2 28 32 33*  BUN 47* 49* 57*  CREATININE 1.95* 1.91* 1.93*  GLUCOSE 107* 133* 86    Electrolytes  Recent Labs Lab 01/02/2016 2213 12/28/15 0353 12/29/15 0326  CALCIUM 9.4 9.2 9.2    CBC  Recent Labs Lab 12/17/2015 2213 12/28/15 0149 12/29/15 0326  WBC 7.3 7.4 8.1  HGB 10.9* 10.2* 9.2*  HCT 36.8 34.0* 31.8*   PLT 220 179 148*    Coag's  Recent Labs Lab 12/28/15 0149 12/28/15 0713 12/29/15 0326  INR 8.48* 1.79* 1.22    Sepsis Markers  Recent Labs Lab 12/13/2015 2223 12/28/15 0157  LATICACIDVEN 1.49 1.10    ABG  Recent Labs Lab 12/28/15 0810  PHART 7.314*  PCO2ART 72.9*  PO2ART 108*    Liver Enzymes  Recent Labs Lab 12/18/2015 2213 12/28/15 0353 12/28/15 1139  AST 18 18 25   ALT 16 15 13*  ALKPHOS 79 73 68  BILITOT 0.8 0.9 1.3*  ALBUMIN 2.7* 2.8* 2.8*    Cardiac Enzymes No results for input(s): TROPONINI, PROBNP in the last 168 hours.  Glucose No results for input(s): GLUCAP in the last 168 hours.  Imaging Dg Chest 1 View  12/28/2015  CLINICAL DATA:  Post left sided thoracentesis. Hx CHF, breast CA. Pt not very responsive, hardly able to be awakened. Unable to provide any hx or current symptoms EXAM: CHEST 1 VIEW COMPARISON:  Chest CT obtained today. Prior chest radiograph, 12/16/2015. FINDINGS: There has been a significant reduction in fluid following left-sided thoracentesis. Small to moderate pleural effusion persists at the lung base. Left lung has expanded. No pneumothorax evident on this supine study. Hazy airspace opacity is noted throughout most of the right lung sparing the apex. Persistent moderate right pleural effusion.  IMPRESSION: 1. No evidence of a pneumothorax following thoracentesis. 2. Significant decreased fluid on the left with partial re-expansion of the left lung. 3. New hazy airspace opacity in the right lung. This is consistent with layering pleural fluid and atelectasis, which was present on the CT. Electronically Signed   By: Lajean Manes M.D.   On: 12/28/2015 15:23   US Thoracentesis Asp Pleural Space W/img Guide  12/28/2015  CLINICAL DATA:  Shortness of breath.  Large left pleural effusion. EXAM: EXAM THORACENTESIS WITH ULTRASOUND TECHNIQUE: The procedure, risks (including but not limited to bleeding, infection, organ damage ), benefits, and  alternatives were explained to the family. Questions regarding the procedure were encouraged and answered. The family understands and consents to the procedure. Survey ultrasound of the left hemithorax was performed and an appropriate skin entry site was localized. Site was marked, prepped with Betadine, draped in usual sterile fashion, infiltrated locally with 1% lidocaine. The Saf-T-Centesis needle was advanced into the pleural space. Bloody fluid returned. 1.3 L was removed. Samples sent for the requested laboratory studies. The patient tolerated procedure well. COMPLICATIONS: COMPLICATIONS None immediate IMPRESSION: Technically successful ultrasound-guided left thoracentesis. Follow-up chest radiograph pending. Electronically Signed   By: Lucrezia Europe M.D.   On: 12/28/2015 15:12       ASSESSMENT / PLAN:  1) acute hypercarbic and hypoxemia resp failure in pt with large effusions L > R and prev excess INR now corrected. L thoracentesis 4/22 x 1.3 liters > transudative - ATX on L is likely related to passive relaxation, not airway obstruction > improved p T centesis 4/22 - Note DNR status - really nothing else to offer here    2) AMS likely due to hypercarbia which should improve p thoracentesis/ not a good candidate for Bipap  And note she actually is well compensated in term of acid /base status which actually helps lower her Ve demand (the higher the buffer the better here)    3) underlying CHF/ moderate AS/ CAF  Noted plus worsening renal failure > DNR status approp here as the underlying problem cannot be addressed medically and not a candidate for HD   Pulmonary f/u is prn      Christinia Gully, MD Pulmonary and [place] [phone] After 5:30 PM or weekends, call [phone]

## 2015-12-30 ENCOUNTER — Inpatient Hospital Stay (HOSPITAL_COMMUNITY): Payer: Medicare Other

## 2015-12-30 LAB — CBC
HEMATOCRIT: 32.5 % — AB (ref 36.0–46.0)
HEMOGLOBIN: 9.7 g/dL — AB (ref 12.0–15.0)
MCH: 29.3 pg (ref 26.0–34.0)
MCHC: 29.8 g/dL — AB (ref 30.0–36.0)
MCV: 98.2 fL (ref 78.0–100.0)
Platelets: 141 10*3/uL — ABNORMAL LOW (ref 150–400)
RBC: 3.31 MIL/uL — ABNORMAL LOW (ref 3.87–5.11)
RDW: 15.2 % (ref 11.5–15.5)
WBC: 8 10*3/uL (ref 4.0–10.5)

## 2015-12-30 LAB — BASIC METABOLIC PANEL
ANION GAP: 19 — AB (ref 5–15)
BUN: 70 mg/dL — AB (ref 6–20)
CHLORIDE: 97 mmol/L — AB (ref 101–111)
CO2: 33 mmol/L — ABNORMAL HIGH (ref 22–32)
Calcium: 9.3 mg/dL (ref 8.9–10.3)
Creatinine, Ser: 1.86 mg/dL — ABNORMAL HIGH (ref 0.44–1.00)
GFR calc Af Amer: 29 mL/min — ABNORMAL LOW (ref >60)
GFR, EST NON AFRICAN AMERICAN: 25 mL/min — AB (ref >60)
Glucose, Bld: 89 mg/dL (ref 65–99)
Potassium: 3.6 mmol/L (ref 3.5–5.1)
Sodium: 149 mmol/L — ABNORMAL HIGH (ref 135–145)

## 2015-12-30 LAB — MAGNESIUM: Magnesium: 2.5 mg/dL — ABNORMAL HIGH (ref 1.7–2.4)

## 2015-12-30 MED ORDER — NITROGLYCERIN 2 % TD OINT
0.5000 [in_us] | TOPICAL_OINTMENT | Freq: Four times a day (QID) | TRANSDERMAL | Status: DC
  Administered 2015-12-30 – 2015-12-31 (×5): 0.5 [in_us] via TOPICAL
  Filled 2015-12-30: qty 30

## 2015-12-30 MED ORDER — MORPHINE SULFATE (PF) 2 MG/ML IV SOLN
1.0000 mg | INTRAVENOUS | Status: DC | PRN
  Administered 2015-12-30: 1 mg via INTRAVENOUS

## 2015-12-30 MED ORDER — LORAZEPAM 2 MG/ML IJ SOLN: 1.0000 mg | INTRAMUSCULAR | Status: DC | PRN

## 2015-12-30 MED ORDER — MORPHINE SULFATE (PF) 2 MG/ML IV SOLN: 1.0000 mg | INTRAVENOUS | Status: DC | PRN

## 2015-12-30 MED ORDER — MORPHINE SULFATE (PF) 2 MG/ML IV SOLN
INTRAVENOUS | Status: AC
  Administered 2015-12-30: 1 mg via INTRAVENOUS
  Filled 2015-12-30: qty 1

## 2015-12-30 MED ORDER — SODIUM CHLORIDE 0.9 % IV SOLN
1.0000 mg/h | INTRAVENOUS | Status: DC
  Administered 2015-12-30: 1 mg/h via INTRAVENOUS
  Filled 2015-12-30: qty 10

## 2015-12-30 NOTE — Clinical Social Work Note (Signed)
CSW received referral for patient needing residential hospice placement.  CSW will speak with patient's family to determine where they would like patient to go.  Jones Broom. [place], MSW, [place] 12/30/2015 6:51 PM

## 2015-12-30 NOTE — Progress Notes (Signed)
Nutrition Brief Note  Patient identified due to low Braden score.   Wt Readings from Last 15 Encounters:  12/30/15 146 lb 11.2 oz (66.543 kg)    Body mass index is 23.69 kg/(m^2). Patient meets criteria for Normal Weight based on current BMI.   Current diet order is NPO. Pt is now comfort care per RN; plan to allow comfort PO's. Labs and medications reviewed.   No nutrition interventions warranted at this time. If nutrition issues arise, please consult RD.   Scarlette Ar RD, LDN Inpatient Clinical Dietitian Pager: [phone] After Hours Pager: [phone]

## 2015-12-30 NOTE — Consult Note (Signed)
Consultation Note Date: 12/30/2015   Patient Name: Yolanda Spears  DOB: 1934-11-03  MRN: [id number]  Age / Sex: 81 y.o., female  PCP: No primary care provider on file. Referring Physician: Thurnell Lose, MD  Reason for Consultation: Disposition, Establishing goals of care, Hospice Evaluation and Non pain symptom management   Clinical Assessment/Narrative:  Ms. Yolanda Spears is a 57 F with PMHx of atrial fibrillation on eliquis, CKD III, h/o CVA, h/o Breast Cancer, Chronic Systolic CHF, HTN who was admitted on 12/28/15 with altered mental status and shortness of breath and found to have acute on chronic respiratory failure secondary to CHF exacerbation and CKD III, transudative pleural effusion s/p diagnostic and therapeutic thoracentesis on 4/22 and acute encephalopathy likely secondary to hypercarbia and hypoxia. Patient is a poor BiPAP candidate due to her altered mental status. She is a DNR and family does not wish to pursue aggressive interventions. Therefore, her respiratory status and encephalopathy are unlikely to improve with supportive care. Patient has not shown much improvement despite paracentesis and diuresis. She is currently not eating or drinking and is NPO. Palliative care has been consulted for [place] discussion and Disposition.  I met our patient, Yolanda Spears, who was somnolent on exam, but easily arousable. She was able to say hello, but unable to answer yes or no questions. I talked at length with her son, Lamona Eimer, over the phone. He currently lives in [place] and is his mother's POA. He was able to tell me that she lives by herself in a condo, but has had declining health ever since January/February. Prior to this year, she was functional and able to perform many of her ADLs. He states she developed CHF earlier this year and has been declining ever since. They have almost full time help with her at her  condo to assist her with her ADLs now. Patient is a DNR prior to my discussion and this was confirmed during our conversation. Her son, Leonor Liv, does wish to avoid aggressive interventions, but he has many questions about Palliative Care and Hospice and the options with each. He is open to Hospice after our discussions. He is hopeful that she would improve, but does understand that realistically it is unlikely. He wants to know if she can continue getting the medication (Lasix) to help her fluid status and shortness of breath. This is the patient's 3rd admission this year for acute on chronic CHF exacerbation.   Contacts/Participants in Discussion: Lidia Collum Primary Decision Maker: Lidia Collum Relationship to Patient Son HCPOA: yes  Also has a daughter who is in town visiting (lives in [place]) and patient's sister who lives in [place]: Consult placed to Care Management for Residential Hospice Placement Await Hospice recommendations prior to initiating full comfort care Avoid aggressive interventions Would continue IV Lasix for shortness of breath  Code Status/Advance Care Planning: DNR    Code Status Orders        Start     Ordered   12/28/15 0105  Do not attempt resuscitation (DNR)   Continuous    Question Answer Comment  In the event of cardiac or respiratory ARREST Do not call a "code blue"   In the event of cardiac or respiratory ARREST Do not perform Intubation, CPR, defibrillation or ACLS   In the event of cardiac or respiratory ARREST Use medication by any route, position, wound care, and other measures to relive pain and suffering. May use oxygen, suction and manual treatment of airway  obstruction as needed for comfort.      12/28/15 0106    Code Status History    Date Active Date Inactive Code Status Order ID Comments User Context   This patient has a current code status but no historical code status.    Advance Directive Documentation         Most Recent Value   Type of Advance Directive  Healthcare Power of Attorney, Living will   Pre-existing out of facility DNR order (yellow form or pink MOST form)     "MOST" Form in Place?        Other Directives:None  Symptom Management:   Shortness of breath  Palliative Prophylaxis:   Aspiration and Delirium Protocol  Additional Recommendations (Limitations, Scope, Preferences):  Avoid Hospitalization and Minimize Medications  Care Management Consult for Hospice Placement  Psycho-social/Spiritual:  Support System: [place] Desire for further Chaplaincy support:no Additional Recommendations: Caregiving  Support/Resources and Education on Hospice  Prognosis: < 2 weeks  FAST Scale 7 NYHA IV with significant symptoms of recurrent CHF at rest and inability to to carry out minimal physical activity without symptoms (dyspnea) and with poor response to diuretics 3rd Admission this year (2017) for CHF exacerbation Treatment resistant atrial fibrillation Rapid decline in the last 2 months  Discharge Planning: Hospice facility  Chief Complaint/ Primary Diagnoses: Present on Admission:  . Systolic congestive heart failure ([place]) . Stroke (cerebrum) ([place]) . Essential hypertension . CKD (chronic kidney disease), stage III . A-fib ([place]) . Acute encephalopathy . SOB (shortness of breath) . Pleural effusion, bilateral . UTI (lower urinary tract infection) . Supratherapeutic INR . Pleural effusion . Pressure ulcer . Chronic respiratory failure with hypoxia and hypercapnia ([facility])  I have reviewed the medical record, interviewed the patient and family, and examined the patient. The following aspects are pertinent.  Past Medical History  Diagnosis Date  . CHF (congestive heart failure) ([place])   . Breast cancer ([place])   . Essential hypertension   . HLD (hyperlipidemia)   . Systolic congestive heart failure ([place])   . A-fib ([place])   . CKD (chronic kidney disease), stage III   . Stroke  (cerebrum) ([place])   . C. difficile colitis    Social History   Social History  . Marital Status: Widowed    Spouse Name: N/A  . Number of Children: N/A  . Years of Education: N/A   Social History Main Topics  . Smoking status: Never Smoker   . Smokeless tobacco: None  . Alcohol Use: No  . Drug Use: No  . Sexual Activity: Not Currently    Birth Control/ Protection: Post-menopausal   Other Topics Concern  . None   Social History Narrative   History reviewed. No pertinent family history. Scheduled Meds: . amiodarone  200 mg Oral Daily  . apixaban  2.5 mg Oral BID  . furosemide  40 mg Intravenous Q12H  . hydrALAZINE  50 mg Oral BID  . metoprolol succinate  25 mg Oral BID  . nitroGLYCERIN  0.5 inch Topical Q6H  . sodium chloride flush  3 mL Intravenous Q12H   Continuous Infusions:  PRN Meds:.acetaminophen **OR** acetaminophen, [DISCONTINUED] ondansetron **OR** ondansetron (ZOFRAN) IV Medications Prior to Admission:  Prior to Admission medications   Medication Sig Start Date End Date Taking? Authorizing Provider  amiodarone (PACERONE) 200 MG tablet Take 200 mg by mouth daily.   Yes Historical Provider, MD  calcium carbonate (TUMS - DOSED IN MG ELEMENTAL CALCIUM) 500 MG chewable tablet Chew  1 tablet by mouth daily.   Yes Historical Provider, MD  Cholecalciferol (VITAMIN D-3 PO) Take 1 tablet by mouth See admin instructions. On Sunday, Tuesday, Thursday and Saturday   Yes Historical Provider, MD  CRANBERRY PO Take 1 tablet by mouth daily.   Yes Historical Provider, MD  ezetimibe (ZETIA) 10 MG tablet Take 5 mg by mouth every evening.   Yes Historical Provider, MD  furosemide (LASIX) 40 MG tablet Take 80 mg by mouth daily.   Yes Historical Provider, MD  hydrALAZINE (APRESOLINE) 50 MG tablet Take 50 mg by mouth 2 (two) times daily.   Yes Historical Provider, MD  isosorbide mononitrate (IMDUR) 60 MG 24 hr tablet Take 60 mg by mouth daily.   Yes Historical Provider, MD  metoprolol  succinate (TOPROL-XL) 25 MG 24 hr tablet Take 25 mg by mouth 2 (two) times daily.   Yes Historical Provider, MD  potassium chloride (K-DUR) 10 MEQ tablet Take 10 mEq by mouth daily.   Yes Historical Provider, MD  pregabalin (LYRICA) 75 MG capsule Take 75 mg by mouth 2 (two) times daily.   Yes Historical Provider, MD  warfarin (COUMADIN) 2.5 MG tablet Take 1.25-2.5 mg by mouth See admin instructions. Take 1 tablet on Wednesday then take 1/2 tablet all the other days   Yes Historical Provider, MD   Allergies  Allergen Reactions  . Penicillins Hives  . Shellfish Allergy   . Statins     Review of Systems  Psychiatric/Behavioral: Positive for dysphoric mood.   Unable to obtain due to Altered mental status  Physical Exam  Filed Vitals:   12/29/15 1619 12/29/15 1936 12/30/15 0352 12/30/15 1118  BP: 138/33 151/36 141/37 132/93  Pulse: 66 61 77 85  Temp:  98.5 F (36.9 C) 98.1 F (36.7 C) 97.6 F (36.4 C)  TempSrc:  Oral Oral Oral  Resp:  _0 Height:      Weight:   146 lb 11.2 oz (66.543 kg)   SpO2: 100% 98% 98% 96%   General: Vital signs reviewed.  Patient is elderly, in no acute distress and cooperative with exam.  Cardiovascular: Irregularly irregular, S1 normal, S2 normal, 3/6 systolic ejection murmur. Pulmonary/Chest: Inspiratory crackles bilaterally, no wheezing or rhonchi. Abdominal: Soft, non-tender, non-distended, BS + Extremities: Trace lower extremity edema bilaterally Neurological: A&O x0, opens eyes to voice, overall somnolent and lethargic, unable to answer questions or follow commands Psychiatric: Delirious   Vital Signs: BP 132/93 mmHg  Pulse 85  Temp(Src) 97.6 F (36.4 C) (Oral)  Resp 20  Ht _1  (1.676 m)  Wt 146 lb 11.2 oz (66.543 kg)  BMI 23.69 kg/m2  SpO2 96%  SpO2: SpO2: 96 % O2 Device:SpO2: 96 % O2 Flow Rate: .O2 Flow Rate (L/min): 3 L/min  IO: Intake/output summary:   Intake/Output Summary (Last 24 hours) at 12/30/15 1519 Last data filed  at 12/30/15 1325  Gross per 24 hour  Intake      0 ml  Output    850 ml  Net   -850 ml    LBM: Last BM Date: 12/28/15 Baseline Weight: Weight: 165 lb (74.844 kg) Most recent weight: Weight: 146 lb 11.2 oz (66.543 kg)      Palliative Assessment/Data:    Additional Data Reviewed:  CBC:    Component Value Date/Time   WBC 8.0 12/30/2015 0329   HGB 9.7* 12/30/2015 0329   HCT 32.5* 12/30/2015 0329   PLT 141* 12/30/2015 0329   MCV 98.2 12/30/2015 0329  NEUTROABS 6.6 12/12/2015 2213   LYMPHSABS 0.3* 12/11/2015 2213   MONOABS 0.4 12/23/2015 2213   EOSABS 0.0 12/10/2015 2213   BASOSABS 0.0 12/19/2015 2213   Comprehensive Metabolic Panel:    Component Value Date/Time   NA 149* 12/30/2015 0329   K 3.6 12/30/2015 0329   CL 97* 12/30/2015 0329   CO2 33* 12/30/2015 0329   BUN 70* 12/30/2015 0329   CREATININE 1.86* 12/30/2015 0329   GLUCOSE 89 12/30/2015 0329   CALCIUM 9.3 12/30/2015 0329   AST 25 12/28/2015 1139   ALT 13* 12/28/2015 1139   ALKPHOS 68 12/28/2015 1139   BILITOT 1.3* 12/28/2015 1139   PROT 6.7 12/28/2015 1139   ALBUMIN 2.8* 12/28/2015 1139     Time In: 10:30 am Time Out: 11:45 am Time Total: 70 minutes Greater than 50%  of this time was spent counseling and coordinating care related to the above assessment and plan.  Signed by: Martyn Malay, DO PGY-2 Internal Medicine Resident Pager # [phone] 12/30/2015 3:19 PM  Please contact Palliative Medicine Team phone at [phone] for questions and concerns.   Attestation:  Seen and examined in conjunction with Dr. Quay Burow.  Agree with note as written above.  Dr. Quay Burow and I were called by RN as patient was feeling symptomatic.  We met with the family once again.  Answered questions relating to treatment and hospice.  Will begin a continuous infusion for pain and dyspnea along with PRN medication for treatment of anxiety.  Anticipate the patient will d/c to residential hospice as soon as a bed is available.  Imogene Burn, [place] Palliative Medicine Pager: [phone]

## 2015-12-30 NOTE — Progress Notes (Signed)
Occupational Therapy Discharge Patient Details Name: Yolanda Spears MRN: [id number] DOB: 25-Jul-1935 Today's Date: 12/30/2015 Time:  -     Patient discharged from OT services secondary to comfort care only per family meeting today..  Please see latest therapy progress note for current level of functioning and progress toward goals.    Progress and discharge plan discussed with patient and/or caregiver: Patient unable to participate in discharge planning and no caregivers available      Vonita Moss   OTR/L Pager: [phone] Office: [phone] .  12/30/2015, 11:37 AM

## 2015-12-30 NOTE — Progress Notes (Signed)
PROGRESS NOTE                                                                                                                                                                                                             Patient Demographics:    Yolanda Spears, is a 81 y.o. female, DOB - 1935-06-27, [id number]  Admit date - 01/03/2016   Admitting Physician Ivor Costa, MD  Outpatient Primary MD for the patient is No primary care provider on file.  LOS - 2  Outpatient Specialists:   Chief Complaint  Patient presents with  . Shortness of Breath       Brief Narrative    Yolanda Spears is a 81 y.o. female with medical history significant of hypertension, diet-controlled diabetes, systolic congestive heart failure with EF of 35-40 percent, breast cancer, atrial fibrillation on Coumadin, chronic kidney disease-stage III, stroke, C. difficile colitis, pacemaker placement, who presents with altered mental status and the shortness breath.  Per patient's son, patient has worsening shortness of breath today. Patient does not have cough, fever, chills or chest pain. She has generalized weakness. Patient is confused and disoriented today. Patient does not have unilateral weakness per her son. Patient has chronic mild diarrhea at baseline, 1-2 bowel movements each day, which has not changed. Patient does not have nausea, vomiting or abdominal pain. Not sure if patient has symptoms of UTI. No active bleeding.  ED Course: pt was found to have supratherapeutic INR>10, lactate 1.49, negative troponin, BNP 539, positive urinalysis with large amount of leukocytes, stable renal function, temperature normal, no tachycardia, oxygen desaturation to 66%, which improved to 100% on nasal cannula oxygen. Chest x-ray showed complete opacification of the left hemi thorax; moderate size right pleural effusion with basilar atelectasis or infiltration. Patient is  admitted to inpatient for further eval and treatment.  # pt has two medical record, another one is [id number]      Subjective:    Yolanda Spears today has, No headache, No chest pain, No abdominal pain - No Nausea, No new weakness tingling or numbness, No Cough but +ve SOB.     Assessment  & Plan :     1. Acute hypoxic and hypercapnic respiratory failure Likely due to acute on chronic CHF with pleural effusion. Pulmonary consulted, patient is DO NOT RESUSCITATE, since she is afebrile without  any leukocytosis doubt this was pneumonia and antibiotics have been stopped the day after admission, continue diuresis for CHF and pleural effusion, She underwent diagnostic and therapeutic left-sided paracentesis on 12/28/2015 with 1.5 L of transudative fluid removed however overall not much improved, continues to be nothing by mouth due to encephalopathy.   Detailed discussion with patient's son wants to focus on gentle medical treatment and comfort care, I think patient has not shown much improvement rate of care will be involved, we will likely will transition to full comfort care soon. She is DO NOT RESUSCITATE.  Continue supportive care with oxygen and nebulizer treatments. Poor BiPAP candidate due to generalized weakness, advanced age and mild somnolent status.   2. Acute encephalopathy. Likely combination of hypercapnia and initial hypoxia. No focal deficits or headache, supportive care as above and monitor. Minimize narcotics and benzodiazepines. Speech consulted. Currently nothing by mouth.  3. UTI. Not septic, cultures negative so far, complete treatment with Rocephin last dose 12/31/2015.  4. Acute on chronic systolic heart failure. Recent EF 35%. IV Lasix, BNP is 540, continue beta blocker & BiDil, due to renal insufficiency no ACE/ARB. She is nothing by mouth might switch from indoor to [employer].  5. Chronic atrial fibrillation with [place] vasc 2 score of 8. Goal will be rate control,  continue beta blocker and amiodarone once she is able to tolerate oral diet, will switch her from Coumadin to Eliquis once she tolerates oral diet, however clinically not doing well for now hold off anticoagulation. May transition to full comfort care.  6. Essential hypertension. Continue BiDil and beta blocker.  7. CK D stage IV. Baseline creatinine close to 1.8. Monitor with diuresis.  8. History of stroke. Switched her to Eliquis from Coumadin for secondary prevention.  9. Stage II sacral decubitus ulcer. Wound care consulted.    Code Status : DNR, prognosis poor. His cussed with son in detail on 12/28/2015, he does not want heroics. Palliative care will be consulted. If she declines further for hospice. We may transition to full comfort care and residential hospice soon.  Family Communication  : None  Disposition Plan  : Stay inpatient  Barriers For Discharge : Acute hypoxic and hypercapnic respiratory failure  Consults  :  PCCM, IR, palliative care  Procedures  :   Left-sided thoracentesis ordered on 12/28/2015 by pulmonary  DVT Prophylaxis  :  Coumadin/SCDs  Lab Results  Component Value Date   PLT 141* 12/30/2015    Antibiotics  :   Anti-infectives    Start     Dose/Rate Route Frequency Ordered Stop   12/28/15 1115  cefTRIAXone (ROCEPHIN) 1 g in dextrose 5 % 50 mL IVPB     1 g 100 mL/hr over 30 Minutes Intravenous Every 24 hours 12/28/15 1106 12/31/15 1114   12/28/15 0200  aztreonam (AZACTAM) 500 mg in dextrose 5 % 50 mL IVPB  Status:  Discontinued     500 mg 100 mL/hr over 30 Minutes Intravenous Every 8 hours 12/28/15 0140 12/28/15 1105   12/13/2015 2245  vancomycin (VANCOCIN) IVPB 1000 mg/200 mL premix     1,000 mg 200 mL/hr over 60 Minutes Intravenous STAT 01/03/2016 2239 12/28/15 0106   01/04/2016 2245  ceFEPIme (MAXIPIME) 1 g in dextrose 5 % 50 mL IVPB     1 g 100 mL/hr over 30 Minutes Intravenous STAT 12/14/2015 2242 12/09/2015 2354        Objective:   Filed  Vitals:   12/29/15 1144 12/29/15 1619 12/29/15  1936 12/30/15 0352  BP: 154/41 138/33 151/36 141/37  Pulse: 63 66 61 77  Temp: 98.3 F (36.8 C)  98.5 F (36.9 C) 98.1 F (36.7 C)  TempSrc: Oral  Oral Oral  Resp: 18  18 21   Height:      Weight:    66.543 kg (146 lb 11.2 oz)  SpO2: 98% 100% 98% 98%    Wt Readings from Last 3 Encounters:  12/30/15 66.543 kg (146 lb 11.2 oz)     Intake/Output Summary (Last 24 hours) at 12/30/15 1033 Last data filed at 12/30/15 0934  Gross per 24 hour  Intake      0 ml  Output   1050 ml  Net  -1050 ml     Physical Exam  Mildly somnolent, No new F.N deficits, Normal affect [place], AT,PERRAL Supple Neck,No JVD, No cervical lymphadenopathy appriciated.  Symmetrical Chest wall movement, reduced L sided B sounds RRR,No Gallops,Rubs or new Murmurs, No Parasternal Heave +ve B.Sounds, Abd Soft, No tenderness, No organomegaly appriciated, No rebound - guarding or rigidity. No Cyanosis, Clubbing or edema, No new Rash or bruise     Data Review:    CBC  Recent Labs Lab 01/02/2016 2213 12/28/15 0149 12/29/15 0326 12/30/15 0329  WBC 7.3 7.4 8.1 8.0  HGB 10.9* 10.2* 9.2* 9.7*  HCT 36.8 34.0* 31.8* 32.5*  PLT 220 179 148* 141*  MCV 98.1 95.8 97.8 98.2  MCH 29.1 28.7 28.3 29.3  MCHC 29.6* 30.0 28.9* 29.8*  RDW 14.8 14.8 15.0 15.2  LYMPHSABS 0.3*  --   --   --   MONOABS 0.4  --   --   --   EOSABS 0.0  --   --   --   BASOSABS 0.0  --   --   --     Chemistries   Recent Labs Lab 12/26/2015 2213 12/28/15 0353 12/28/15 1139 12/29/15 0326 12/30/15 0329  NA 141 141  --  145 149*  K 4.1 3.7  --  3.8 3.6  CL 97* 96*  --  97* 97*  CO2 28 32  --  33* 33*  GLUCOSE 107* 133*  --  86 89  BUN 47* 49*  --  57* 70*  CREATININE 1.95* 1.91*  --  1.93* 1.86*  CALCIUM 9.4 9.2  --  9.2 9.3  MG  --   --   --   --  2.5*  AST 18 18 25   --   --   ALT 16 15 13*  --   --   ALKPHOS 79 73 68  --   --   BILITOT 0.8 0.9 1.3*  --   --     ------------------------------------------------------------------------------------------------------------------ No results for input(s): CHOL, HDL, LDLCALC, TRIG, CHOLHDL, LDLDIRECT in the last 72 hours.  No results found for: HGBA1C ------------------------------------------------------------------------------------------------------------------ No results for input(s): TSH, T4TOTAL, T3FREE, THYROIDAB in the last 72 hours.  Invalid input(s): FREET3 ------------------------------------------------------------------------------------------------------------------ No results for input(s): VITAMINB12, FOLATE, FERRITIN, TIBC, IRON, RETICCTPCT in the last 72 hours.  Coagulation profile  Recent Labs Lab 01/04/2016 2213 12/28/15 0149 12/28/15 0713 12/29/15 0326  INR >10.00* 8.48* 1.79* 1.22    No results for input(s): DDIMER in the last 72 hours.  Cardiac Enzymes No results for input(s): CKMB, TROPONINI, MYOGLOBIN in the last 168 hours.  Invalid input(s): CK ------------------------------------------------------------------------------------------------------------------    Component Value Date/Time   BNP 539.0* 12/07/2015 2213    Inpatient Medications  Scheduled Meds: . amiodarone  200 mg Oral Daily  .  apixaban  2.5 mg Oral BID  . calcium carbonate  1 tablet Oral Daily  . cefTRIAXone (ROCEPHIN)  IV  1 g Intravenous Q24H  . cholecalciferol  1,000 Units Oral Q T,Th,S,Su  . ezetimibe  5 mg Oral QPM  . furosemide  40 mg Intravenous Q12H  . hydrALAZINE  50 mg Oral BID  . isosorbide mononitrate  60 mg Oral Daily  . metoprolol succinate  25 mg Oral BID  . pregabalin  75 mg Oral BID  . sodium chloride flush  3 mL Intravenous Q12H   Continuous Infusions:  PRN Meds:.acetaminophen **OR** acetaminophen, morphine injection, [DISCONTINUED] ondansetron **OR** ondansetron (ZOFRAN) IV  Micro Results Recent Results (from the past 240 hour(s))  Blood culture (routine x 2)      Status: None (Preliminary result)   Collection Time: 12/25/2015 10:05 PM  Result Value Ref Range Status   Specimen Description BLOOD RIGHT HAND  Final   Special Requests BOTTLES DRAWN AEROBIC ONLY 5CC  Final   Culture NO GROWTH 2 DAYS  Final   Report Status PENDING  Incomplete  Blood culture (routine x 2)     Status: None (Preliminary result)   Collection Time: 01/03/2016 10:12 PM  Result Value Ref Range Status   Specimen Description BLOOD RIGHT FOREARM  Final   Special Requests BOTTLES DRAWN AEROBIC ONLY 5CC  Final   Culture NO GROWTH 2 DAYS  Final   Report Status PENDING  Incomplete  Urine culture     Status: Abnormal (Preliminary result)   Collection Time: 12/13/2015 11:16 PM  Result Value Ref Range Status   Specimen Description URINE, CATHETERIZED  Final   Special Requests Normal  Final   Culture >=100,000 COLONIES/mL GRAM NEGATIVE RODS (A)  Final   Report Status PENDING  Incomplete  Culture, body fluid-bottle     Status: None (Preliminary result)   Collection Time: 12/28/15  2:54 PM  Result Value Ref Range Status   Specimen Description FLUID PLEURAL LEFT  Final   Special Requests NONE  Final   Culture NO GROWTH < 24 HOURS  Final   Report Status PENDING  Incomplete  Gram stain     Status: None   Collection Time: 12/28/15  2:54 PM  Result Value Ref Range Status   Specimen Description FLUID PLEURAL LEFT  Final   Special Requests NONE  Final   Gram Stain NO WBC SEEN NO ORGANISMS SEEN   Final   Report Status 12/28/2015 FINAL  Final    Radiology Reports Ct Abdomen Pelvis Wo Contrast  12/30/2015  CLINICAL DATA:  Abdominal pain. EXAM: CT ABDOMEN AND PELVIS WITHOUT CONTRAST TECHNIQUE: Multidetector CT imaging of the abdomen and pelvis was performed following the standard protocol without IV contrast. COMPARISON:  Plain films 12/30/2015 FINDINGS: Lower chest: Large bilateral pleural effusions with compressive atelectasis in the lower lobes. Consolidation noted in the lingula concerning  for pneumonia. Heart is enlarged. Pacer wires noted in the right heart. Hepatobiliary: No visible focal abnormality.  Prior cholecystectomy. Pancreas: Unremarkable unenhanced appearance Spleen: Calcifications throughout the spleen compatible with old granulomatous disease. Adrenals/Urinary Tract: Mild fullness of the renal collecting systems bilaterally. Ureters are decompressed. Gallbladder wall appears thickened which could be related to cystitis. Foley catheter in place. No renal stones. No visible focal renal abnormality on this unenhanced study. Areas of scarring in the left kidney. Adrenal glands are unremarkable. Stomach/Bowel: Stomach, large and small bowel decompressed and grossly unremarkable. Vascular/Lymphatic: Heavily calcified aorta. No aneurysm. No adenopathy. Reproductive: Prior hysterectomy.  No adnexal masses. Other: No free fluid or free air. Musculoskeletal: No acute bony abnormality or focal bone lesion. Degenerative changes throughout the visualized thoracolumbar spine. IMPRESSION: Large bilateral pleural effusions with compressive atelectasis in the lower lobes. Consolidation in the lingula is concerning for pneumonia. Cardiomegaly. Old granulomatous disease within the spleen. Mild fullness of the renal collecting systems bilaterally without pain for to hydronephrosis. Ureters are decompressed. Mild bladder wall thickening may reflect cystitis. Electronically Signed   By: Rolm Baptise M.D.   On: 12/30/2015 10:27   Dg Chest 1 View  12/28/2015  CLINICAL DATA:  Post left sided thoracentesis. Hx CHF, breast CA. Pt not very responsive, hardly able to be awakened. Unable to provide any hx or current symptoms EXAM: CHEST 1 VIEW COMPARISON:  Chest CT obtained today. Prior chest radiograph, 12/18/2015. FINDINGS: There has been a significant reduction in fluid following left-sided thoracentesis. Small to moderate pleural effusion persists at the lung base. Left lung has expanded. No pneumothorax  evident on this supine study. Hazy airspace opacity is noted throughout most of the right lung sparing the apex. Persistent moderate right pleural effusion. IMPRESSION: 1. No evidence of a pneumothorax following thoracentesis. 2. Significant decreased fluid on the left with partial re-expansion of the left lung. 3. New hazy airspace opacity in the right lung. This is consistent with layering pleural fluid and atelectasis, which was present on the CT. Electronically Signed   By: Lajean Manes M.D.   On: 12/28/2015 15:23   Dg Chest 2 View  12/18/2015  CLINICAL DATA:  Shortness of breath for a few days. Generalize weakness. EXAM: CHEST  2 VIEW COMPARISON:  None. FINDINGS: There is complete opacification of the left hemi thorax. In the absence of prior surgery, this could represent either or a combination of diffuse consolidation, large pleural effusion, and atelectasis. Volume loss on the left with mild hyperinflation on the right. There is a moderate-sized right pleural effusion. There is focal infiltration in the right lower lung which could represent pneumonia or atelectasis. Heart size is obscured by the parenchymal process. No definite vascular congestion on the right. Calcification of the aorta. Degenerative changes in the spine. Cardiac pacemaker. IMPRESSION: Complete opacification of the left hemi thorax. Moderate size right pleural effusion with basilar atelectasis or infiltration. Electronically Signed   By: Lucienne Capers M.D.   On: 12/14/2015 21:56   Ct Chest Wo Contrast  12/28/2015  CLINICAL DATA:  Shortness of breath for 2 days and weakness. EXAM: CT CHEST WITHOUT CONTRAST TECHNIQUE: Multidetector CT imaging of the chest was performed following the standard protocol without IV contrast. COMPARISON:  Chest radiograph 12/18/2015 FINDINGS: Examination is technically limited due streak artifact arising from the cardiac pacemaker in from the patient's arms. Cardiac enlargement. Coronary artery  calcifications. Mildly dilated ascending thoracic aorta at 3.5 cm diameter. Aortic calcifications are present. Prominent central pulmonary arteries may represent arterial hypertension. Calcified granuloma in the right lung with calcified right hilar lymph nodes. Enlarged pretracheal lymph node measures 1.3 cm short axis dimension. Diffusely enlarged and heterogeneous thyroid gland with scattered calcifications and calcified nodules. Consider follow-up with elective ultrasound for further evaluation. Esophagus is decompressed. There is a large left pleural effusion with complete collapse and consolidation of the left lung. Material is present throughout the left mainstem and visualize central left bronchi. This is likely due to secretions. An endobronchial obstructing lesion is not excluded. Moderate size right pleural effusion with atelectasis or consolidation in the right lung base. Patchy airspace disease  throughout the right lung. No pneumothorax. Included portions of the upper abdominal organs demonstrate surgical absence of the gallbladder. Degenerative changes throughout the spine. No destructive bone lesions. IMPRESSION: Large left pleural effusion with complete collapse and consolidation of the left lung. Secretions filled the left bronchi. Changes likely represent consolidated pneumonia although endobronchial obstructing lesion is not excluded. Moderate right pleural effusion with atelectasis and infiltration in the right lung. Diffusely enlarged and heterogeneous nodular thyroid gland with calcified nodules. Electronically Signed   By: Lucienne Capers M.D.   On: 12/28/2015 03:38   Dg Abd Portable 1v  12/30/2015  CLINICAL DATA:  Nausea, abdominal pain EXAM: PORTABLE ABDOMEN - 1 VIEW COMPARISON:  Chest x-ray same day FINDINGS: There is normal small bowel gas pattern. Degenerative changes lumbar spine. Small bilateral pleural effusion with bilateral basilar atelectasis. Some colonic gas noted in distal  colon without colonic distension. IMPRESSION: Normal small bowel gas pattern. Electronically Signed   By: Lahoma Crocker M.D.   On: 12/30/2015 09:59   US Thoracentesis Asp Pleural Space W/img Guide  12/28/2015  CLINICAL DATA:  Shortness of breath.  Large left pleural effusion. EXAM: EXAM THORACENTESIS WITH ULTRASOUND TECHNIQUE: The procedure, risks (including but not limited to bleeding, infection, organ damage ), benefits, and alternatives were explained to the family. Questions regarding the procedure were encouraged and answered. The family understands and consents to the procedure. Survey ultrasound of the left hemithorax was performed and an appropriate skin entry site was localized. Site was marked, prepped with Betadine, draped in usual sterile fashion, infiltrated locally with 1% lidocaine. The Saf-T-Centesis needle was advanced into the pleural space. Bloody fluid returned. 1.3 L was removed. Samples sent for the requested laboratory studies. The patient tolerated procedure well. COMPLICATIONS: COMPLICATIONS None immediate IMPRESSION: Technically successful ultrasound-guided left thoracentesis. Follow-up chest radiograph pending. Electronically Signed   By: Lucrezia Europe M.D.   On: 12/28/2015 15:12    Time Spent in minutes  30   SINGH,PRASHANT K M.D on 12/30/2015 at 10:33 AM  Between 7am to 7pm - Pager - [phone]  After 7pm go to [email or web address] - password [facility]  [facility] -  Office  [phone]

## 2015-12-30 NOTE — Progress Notes (Signed)
PT Cancellation Note  Patient Details Name: Yolanda Spears MRN: [id number] DOB: 08/26/1935   Cancelled Treatment:    Reason Eval/Treat Not Completed: Patient at procedure or test/unavailable (Pt leaving room for CT).  PT will continue to follow acutely.  Collie Siad PT, DPT  Pager: [phone] Phone: [phone] 12/30/2015, 9:48 AM

## 2015-12-30 NOTE — Progress Notes (Signed)
PT Cancellation Note  Patient Details Name: Yolanda Spears MRN: [id number] DOB: 04-Mar-1935   Cancelled Treatment:    Reason Eval/Treat Not Completed: Other (comment).  Patient discharged from PT services secondary to comfort care only per family meeting today.    Collie Siad PT, DPT  Pager: [phone] Phone: [phone] 12/30/2015, 11:55 AM

## 2015-12-30 NOTE — Evaluation (Signed)
Clinical/Bedside Swallow Evaluation Patient Details  Name: Yolanda Spears MRN: [id number] Date of Birth: 1935-06-04  Today's Date: 12/30/2015 Time: SLP Start Time (ACUTE ONLY): 1055 SLP Stop Time (ACUTE ONLY): 1116 SLP Time Calculation (min) (ACUTE ONLY): 21 min  Past Medical History:  Past Medical History  Diagnosis Date  . CHF (congestive heart failure) ([place])   . Breast cancer ([place])   . Essential hypertension   . HLD (hyperlipidemia)   . Systolic congestive heart failure ([place])   . A-fib ([place])   . CKD (chronic kidney disease), stage III   . Stroke (cerebrum) ([place])   . C. difficile colitis    Past Surgical History:  Past Surgical History  Procedure Laterality Date  . Mastectomy    . Breast surgery     HPI:  81 y.o. female with medical history significant of hypertension, diet-controlled diabetes, systolic congestive heart failure with EF of 35-40 percent, breast cancer, atrial fibrillation on Coumadin, chronic kidney disease-stage III, stroke, C. difficile colitis, pacemaker placement, who presents with altered mental status and the shortness breath.  Dx acute hypoxic and hypercapnic respiratory failure likely due to acute on chronic CHF with pleural effusion, encephalopathy, UTI. May transition to full comfort care per MD notes.  Palliative Medicine consult pending.   Assessment / Plan / Recommendation Clinical Impression  Pt intermittently responsive; alerts to name and cool washcloth, and is able to sustain attention in order to consume limited ice chips, sips of water.  Requires mod verbal/visual cues to chew and swallow, but did not demonstrate overt s/s of aspiration.  For now, allow sips of water, ice chips in small quantities.  Will follow for [place] after meeting with Palliative Medicine.     Aspiration Risk  Moderate aspiration risk    Diet Recommendation   sips of water, ice chips - otherwise NPO while not alert       Other  Recommendations Oral Care Recommendations:  Oral care QID   Follow up Recommendations       Frequency and Duration min 2x/week  2 weeks       Prognosis Prognosis for Safe Diet Advancement: Fair      Swallow Study   General HPI: 81 y.o. female with medical history significant of hypertension, diet-controlled diabetes, systolic congestive heart failure with EF of 35-40 percent, breast cancer, atrial fibrillation on Coumadin, chronic kidney disease-stage III, stroke, C. difficile colitis, pacemaker placement, who presents with altered mental status and the shortness breath.  Dx acute hypoxic and hypercapnic respiratory failure likely due to acute on chronic CHF with pleural effusion, encephalopathy, UTI. May transition to full comfort care per MD notes.  Type of Study: Bedside Swallow Evaluation Previous Swallow Assessment: no Diet Prior to this Study: NPO Temperature Spikes Noted: No Respiratory Status: Nasal cannula History of Recent Intubation: No Behavior/Cognition: Lethargic/Drowsy Oral Cavity Assessment: Within Functional Limits Oral Care Completed by SLP: No Oral Cavity - Dentition: Adequate natural dentition Vision:  (unable to assess) Self-Feeding Abilities: Total assist Patient Positioning: Upright in bed Baseline Vocal Quality: Normal Volitional Cough: Cognitively unable to elicit Volitional Swallow: Unable to elicit    Oral/Motor/Sensory Function Overall Oral Motor/Sensory Function:  (+symmetry)   Ice Chips Ice chips: Impaired Presentation: Spoon Oral Phase Impairments: Poor awareness of bolus Oral Phase Functional Implications: Prolonged oral transit Pharyngeal Phase Impairments: Suspected delayed Swallow   Thin Liquid Thin Liquid: Impaired Presentation: Spoon Oral Phase Impairments: Poor awareness of bolus Oral Phase Functional Implications: Oral holding Pharyngeal  Phase Impairments: Multiple  swallows    Nectar Thick Nectar Thick Liquid: Not tested   Honey Thick Honey Thick Liquid: Not tested   Puree  Puree: Not tested   Solid   GO   Solid: Not tested       Estill Bamberg L. Tivis Ringer, [place] CCC/SLP Pager [phone]  Juan Quam Laurice 12/30/2015,11:25 AM

## 2015-12-30 NOTE — Care Management Note (Signed)
Case Management Note  Patient Details  Name: Yolanda Spears MRN: [id number] Date of Birth: 19-Oct-1934  Subjective/Objective:   Pt admitted with SOB                 Action/Plan:  Pt is from home with caregiver; current plan is for pt to discharge to [facility].  CSW consulted   Expected Discharge Date:  12/31/15               Expected Discharge Plan:  [place]  In-House Referral:  Clinical Social Work  Discharge planning Services     Post Acute Care Choice:    Choice offered to:     DME Arranged:    DME Agency:     HH Arranged:    HH Agency:     Status of Service:  In process, will continue to follow  Medicare Important Message Given:    Date Medicare IM Given:    Medicare IM give by:    Date Additional Medicare IM Given:    Additional Medicare Important Message give by:     If discussed at [place] of Stay Meetings, dates discussed:    Additional Comments:  Maryclare Labrador, RN 12/30/2015, 3:34 PM

## 2015-12-31 ENCOUNTER — Other Ambulatory Visit: Payer: Self-pay | Admitting: *Deleted

## 2015-12-31 LAB — BASIC METABOLIC PANEL
Anion gap: 14 (ref 5–15)
BUN: 107 mg/dL — ABNORMAL HIGH (ref 6–20)
CO2: 36 mmol/L — ABNORMAL HIGH (ref 22–32)
Calcium: 9.6 mg/dL (ref 8.9–10.3)
Chloride: 105 mmol/L (ref 101–111)
Creatinine, Ser: 2.62 mg/dL — ABNORMAL HIGH (ref 0.44–1.00)
GFR calc Af Amer: 19 mL/min — ABNORMAL LOW (ref >60)
GFR calc non Af Amer: 16 mL/min — ABNORMAL LOW (ref >60)
Glucose, Bld: 93 mg/dL (ref 65–99)
Potassium: 4.4 mmol/L (ref 3.5–5.1)
Sodium: 155 mmol/L — ABNORMAL HIGH (ref 135–145)

## 2015-12-31 LAB — CBC
HCT: 36.9 % (ref 36.0–46.0)
Hemoglobin: 10.6 g/dL — ABNORMAL LOW (ref 12.0–15.0)
MCH: 29 pg (ref 26.0–34.0)
MCHC: 28.7 g/dL — ABNORMAL LOW (ref 30.0–36.0)
MCV: 100.8 fL — ABNORMAL HIGH (ref 78.0–100.0)
Platelets: 182 10*3/uL (ref 150–400)
RBC: 3.66 MIL/uL — ABNORMAL LOW (ref 3.87–5.11)
RDW: 15.1 % (ref 11.5–15.5)
WBC: 13.2 10*3/uL — ABNORMAL HIGH (ref 4.0–10.5)

## 2015-12-31 MED ORDER — METOPROLOL TARTRATE 1 MG/ML IV SOLN: 5.0000 mg | INTRAVENOUS | Status: DC | PRN

## 2015-12-31 MED ORDER — HYDRALAZINE HCL 20 MG/ML IJ SOLN: 10.0000 mg | Freq: Four times a day (QID) | INTRAMUSCULAR | Status: DC | PRN

## 2015-12-31 MED ORDER — CIPROFLOXACIN IN D5W 400 MG/200ML IV SOLN: 400.0000 mg | Freq: Two times a day (BID) | INTRAVENOUS | Status: DC

## 2015-12-31 MED ORDER — GLYCOPYRROLATE 1 MG PO TABS
1.0000 mg | ORAL_TABLET | ORAL | Status: DC | PRN
  Filled 2015-12-31: qty 1

## 2015-12-31 MED ORDER — BISACODYL 10 MG RE SUPP: 10.0000 mg | Freq: Every day | RECTAL | Status: DC | PRN

## 2015-12-31 MED ORDER — LORAZEPAM 2 MG/ML PO CONC: 1.0000 mg | Freq: Four times a day (QID) | ORAL | Status: AC | PRN

## 2015-12-31 MED ORDER — MORPHINE SULFATE (CONCENTRATE) 10 MG/0.5ML PO SOLN: 10.0000 mg | ORAL | Status: AC | PRN

## 2015-12-31 MED ORDER — GLYCOPYRROLATE 0.2 MG/ML IJ SOLN
0.2000 mg | INTRAMUSCULAR | Status: DC | PRN
  Filled 2015-12-31: qty 1

## 2015-12-31 NOTE — Care Management Important Message (Signed)
Important Message  Patient Details  Name: Jude Hailstone MRN: [id number] Date of Birth: 02/20/1935   Medicare Important Message Given:  Yes    Kya Mayfield P Jeffren Dombek 12/31/2015, 1:30 PM

## 2015-12-31 NOTE — Patient Outreach (Addendum)
[facility]) Care Management  12/31/2015  ZAWADI BAILIN 01-25-1935 [id number]  Assessment: Transition of care - week 3 Call placed to speak with patient twice but unable to reach her. Unable to leave a message since voicemail is not set-up.  Call placed to son (emergency contact) and was able to speak with him briefly. He reports that patient is currently admitted at [facility] and had been there since Friday night (4/21).  Patient's son further stated that patient will not be discharging any time soon since she is with Hospice Care and in her final stages of life.  He states that he certainly doesn't have time to discuss anything further right now but he expressed his sincere appreciation for the great help and tremendous efforts provided by care management coordinator to his mother (patient).   Called and spoke with hospital liaison Eliott Nine) about patient's status and community care management coordinator being unable to see patient's admission to the hospital via EPIC. Hospital liaison reports that patient has 2 different medical records used and patient is [facility] with her old medical record number. Hospital liaison states she will try to notify In-patient case manager to reconcile patient's medical records.  Called and spoke to G. Kennis Carina Recruitment consultant [facility] care management) and notified her of gathered information regarding patient's medical records. She expressed understanding and asked to let hospital liaison request for a "chart merge" for this patient. Called and left message for V. Brewer (hospital liaison) to request for chart merge.  Care coordination call made to primary care provider's office to inform of patient's hospital admission ([facility]).    Plan: Will await for hospital liaison's update regarding patient's status.  Marcelina Mclaurin A. Keyleigh Manninen, BSN, RN-BC Central Management Coordinator Cell: [phone]

## 2015-12-31 NOTE — Progress Notes (Signed)
Speech Pathology:  Pt being evaluated for residential hospice; more somnolent through the night.  No further SLP f/u is warranted - our services will sign off. Thank you, Mahina Salatino L. Tivis Ringer, [place] CCC/SLP Pager [phone]

## 2015-12-31 NOTE — Progress Notes (Signed)
Daily Progress Note   Patient Name: Yolanda Spears       Date: 12/31/2015 DOB: 11-May-1935  Age: 81 y.o. MRN#: [id number] Attending Physician: Thurnell Lose, MD Primary Care Physician: No primary care provider on file. Admit Date: 12/26/2015  Reason for Consultation/Follow-up: Disposition, Establishing goals of care and Hospice Evaluation  Subjective: Yolanda Spears is our 81 yo female admitted with acute on chronic respiratory failure, CHF exacerbation, CKD IV, and acute encephalopathy secondary to hypoxia and hypercarbia. Patient was seen and examined this morning. She is minimally responsive, but comfortable. Per nursing staff, she has been comfortable throughout the night. Her daughter was present in the room. Her only concern that her mother was receiving too much pain medication and requested for her to be "more present." Per nursing staff, patient began to change last night even prior to the initiation of the drip, becoming more somnolent and minimally responsive.   Family is agreeable to FULL COMFORT CARE. We discussed discontinuing her cardiac po medications including the IV Lasix as this is giving her little benefit. We will continue comfort medications as below. Residential hospice is not actively being pursued as an in-hospital death is expected. Family requests a Chaplain referral. They do not want to transfer her to the [place] [facility] at this time.   Interval Events: Full Comfort Care  Length of Stay: 3 days  Current Medications: Scheduled Meds:  . nitroGLYCERIN  0.5 inch Topical Q6H  . sodium chloride flush  3 mL Intravenous Q12H    Continuous Infusions: . morphine 0.5 mg/hr (12/31/15 0940)    PRN Meds: glycopyrrolate **OR** glycopyrrolate **OR** glycopyrrolate, LORazepam,  morphine injection, [DISCONTINUED] ondansetron **OR** ondansetron (ZOFRAN) IV  Physical Exam: Physical Exam             Filed Vitals:   12/29/15 1936 12/30/15 0352 12/30/15 1118 12/30/15 2205  BP: 151/36 141/37 132/93 140/61  Pulse: 61 77 85 89  Temp: 98.5 F (36.9 C) 98.1 F (36.7 C) 97.6 F (36.4 C) 98.5 F (36.9 C)  TempSrc: Oral Oral Oral Axillary  Resp: 18 21 20 18   Height:      Weight:  146 lb 11.2 oz (66.543 kg)    SpO2: 98% 98% 96% 97%   General: Vital signs reviewed.  Patient is elderly, in no acute distress and  cooperative with exam.  Cardiovascular: Irregularly irregular, 3/6 systolic ejection murmur. Pulmonary/Chest: Bilaterally inspiratory crackles, irregular breathing pattern Extremities: 1+ lower extremity edema bilaterally Neurological: Unresponsive  Skin: No obvious mottling, warm, well perfused extremities.  Intake/output summary:   Intake/Output Summary (Last 24 hours) at 12/31/15 1324 Last data filed at 12/31/15 0200  Gross per 24 hour  Intake      0 ml  Output    350 ml  Net   -350 ml   LBM: Last BM Date: 12/28/15 Baseline Weight: Weight: 165 lb (74.844 kg) Most recent weight: Weight: 146 lb 11.2 oz (66.543 kg)       Palliative Assessment/Data:   Additional Data Reviewed: CBC    Component Value Date/Time   WBC 13.2* 12/31/2015 0857   RBC 3.66* 12/31/2015 0857   HGB 10.6* 12/31/2015 0857   HCT 36.9 12/31/2015 0857   PLT 182 12/31/2015 0857   MCV 100.8* 12/31/2015 0857   MCH 29.0 12/31/2015 0857   MCHC 28.7* 12/31/2015 0857   RDW 15.1 12/31/2015 0857   LYMPHSABS 0.3* 12/16/2015 2213   MONOABS 0.4 12/31/2015 2213   EOSABS 0.0 12/24/2015 2213   BASOSABS 0.0 01/03/2016 2213    CMP     Component Value Date/Time   NA 155* 12/31/2015 0857   K 4.4 12/31/2015 0857   CL 105 12/31/2015 0857   CO2 36* 12/31/2015 0857   GLUCOSE 93 12/31/2015 0857   BUN 107* 12/31/2015 0857   CREATININE 2.62* 12/31/2015 0857   CALCIUM 9.6 12/31/2015 0857    PROT 6.7 12/28/2015 1139   ALBUMIN 2.8* 12/28/2015 1139   AST 25 12/28/2015 1139   ALT 13* 12/28/2015 1139   ALKPHOS 68 12/28/2015 1139   BILITOT 1.3* 12/28/2015 1139   GFRNONAA 16* 12/31/2015 0857   GFRAA 19* 12/31/2015 0857    Problem List:  Patient Active Problem List   Diagnosis Date Noted  . Chronic respiratory failure with hypoxia and hypercapnia ([place]) 12/29/2015  . Acute encephalopathy 12/28/2015  . SOB (shortness of breath) 12/28/2015  . Pleural effusion, bilateral 12/28/2015  . UTI (lower urinary tract infection) 12/28/2015  . Supratherapeutic INR 12/28/2015  . Pleural effusion 12/28/2015  . Pressure ulcer 12/28/2015  . Breast cancer ([place])   . Essential hypertension   . HLD (hyperlipidemia)   . Systolic congestive heart failure ([place])   . A-fib ([place])   . CKD (chronic kidney disease), stage III   . Stroke (cerebrum) ([place])   . Chronic systolic congestive heart failure [facility])      Palliative Care Assessment & Plan    1.Code Status:  DNR    Code Status Orders        Start     Ordered   12/28/15 0105  Do not attempt resuscitation (DNR)   Continuous    Question Answer Comment  In the event of cardiac or respiratory ARREST Do not call a "code blue"   In the event of cardiac or respiratory ARREST Do not perform Intubation, CPR, defibrillation or ACLS   In the event of cardiac or respiratory ARREST Use medication by any route, position, wound care, and other measures to relive pain and suffering. May use oxygen, suction and manual treatment of airway obstruction as needed for comfort.      12/28/15 0106    Code Status History    Date Active Date Inactive Code Status Order ID Comments User Context   This patient has a current code status but no historical code status.  Advance Directive Documentation        Most Recent Value   Type of Advance Directive  Healthcare Power of Attorney, Living will   Pre-existing out of facility DNR order (yellow form or pink  MOST form)     "MOST" Form in Place?         2. Goals of Care/Additional Recommendations:  FULL Comfort Care  Anticipate in-hospital death  Family not interested in transferring to [facility] at this time  D/C lasix, metoprolol, hydralazine, amiodarone  Family desires to stay at [facility] 0.5 mg/hr gtt  Desire for further Chaplaincy support: yes, consult placed  Psycho-social Needs: Caregiving  Support/Resources and Education on Hospice  3. Symptom Management:      1. Morphine gtt at 0.5 mg/hr for shortness of breath and pain control      2. Ativan prn      3. Zofran prn      4. Robinul  4. Palliative Prophylaxis:  None  5. Prognosis: Hours - Days  6. Discharge Planning:  Anticipated In-Hospital Death  Care plan was discussed with patient's daughter and Wadie Lessen, NP.  Thank you for allowing the Palliative Medicine Team to assist in the care of this patient.   Time In:  8:45 am 12:00 pm Time Out: 9:15 am 12:20 pm Total Time  50 minutes Prolonged Time Billed  no        Martyn Malay, DO PGY-2 Internal Medicine Resident Pager # [phone] 12/31/2015 1:24 PM     Please contact Palliative Medicine Team phone at [phone] for questions and concerns.

## 2015-12-31 NOTE — Progress Notes (Signed)
Family at bedside with pt. No distress noted. Family requesting that pt. Not be repositioned at this time as she is resting comfortably. RN will continue to monitor pt. For changes in condition. [employer], Katherine Roan'

## 2015-12-31 NOTE — Progress Notes (Signed)
PROGRESS NOTE                                                                                                                                                                                                             Patient Demographics:    Yolanda Spears, is a 81 y.o. female, DOB - May 25, 1935, SRP:[id number]  Admit date - 01/03/2016   Admitting Physician Ivor Costa, MD  Outpatient Primary MD for the patient is No primary care provider on file.  LOS - 3  Outpatient Specialists:   Chief Complaint  Patient presents with  . Shortness of Breath       Brief Narrative    Yolanda Spears is a 81 y.o. female with medical history significant of hypertension, diet-controlled diabetes, systolic congestive heart failure with EF of 35-40 percent, breast cancer, atrial fibrillation on Coumadin, chronic kidney disease-stage III, stroke, C. difficile colitis, pacemaker placement, who presents with altered mental status and the shortness breath.  Per patient's son, patient has worsening shortness of breath today. Patient does not have cough, fever, chills or chest pain. She has generalized weakness. Patient is confused and disoriented today. Patient does not have unilateral weakness per her son. Patient has chronic mild diarrhea at baseline, 1-2 bowel movements each day, which has not changed. Patient does not have nausea, vomiting or abdominal pain. Not sure if patient has symptoms of UTI. No active bleeding.  ED Course: pt was found to have supratherapeutic INR>10, lactate 1.49, negative troponin, BNP 539, positive urinalysis with large amount of leukocytes, stable renal function, temperature normal, no tachycardia, oxygen desaturation to 66%, which improved to 100% on nasal cannula oxygen. Chest x-ray showed complete opacification of the left hemi thorax; moderate size right pleural effusion with basilar atelectasis or infiltration. Patient is  admitted to inpatient for further eval and treatment.   Pt has two medical record, another one is [id number]    After Admission to the hospital had detailed discussions with patient's son 2, he was leaning towards comfort care and hospice. Patient's mental status never really improved and she remained intermittently responsive. A little care talked with the family on 12/30/2015 and she was transitioned to morphine drip.   On 12/30/2013 she was somnolent and less responsive, daughter was sleeping with bed sheets over her head in the recliner, she briefly uncovered her head  and told me that can lower the morphine rate so that patient is more awake. I informed her that she was minimally responsive to begin with although she has became more somnolent but she appears very comfortable, will inform palliative care to address it ASAP, she was seen by palliative care within an hour.    Subjective:    Yolanda Spears today Is somnolent, unable to answer questions but appears comfortable.     Assessment  & Plan :     1. Acute hypoxic and hypercapnic respiratory failure Likely due to acute on chronic CHF with pleural effusion. Pulmonary consulted, patient is DO NOT RESUSCITATE, since she is afebrile without any leukocytosis doubt this was pneumonia and antibiotics have been stopped the day after admission, continue diuresis for CHF and pleural effusion, She underwent diagnostic and therapeutic left-sided paracentesis on 12/28/2015 with 1.5 L of transudative fluid removed however overall not much improved, continues to be nothing by mouth due to encephalopathy.   Detailed discussion with patient's son wants to focus on gentle medical treatment and comfort care, Family met with palliative care on 12/30/2015 and she was transitioned to morphine drip with full comfort care.  2. Acute encephalopathy. Likely combination of hypercapnia and initial hypoxia. No focal deficits or headache, supportive care as above and  monitor. Minimize narcotics and benzodiazepines. Speech following currently nothing by mouth, she was intermittently responsive up to 12/30/2015 and after the association of morphine drip on 12/30/2015 now she is less responsive this morning. However she is definitely more comfortable.  On 12/30/2013 she was somnolent and less responsive, daughter was sleeping with bed sheets over her head in the recliner, she briefly uncovered her head and told me that can lower the morphine rate so that patient is more awake. I informed her that she was minimally responsive to begin with although she has became more somnolent but she appears very comfortable, will inform palliative care to address it ASAP, she was seen by palliative care within an hour.  3. UTI. Not septic, cultures negative so far, completed treatment with Rocephin last dose 12/31/2015.  4. Acute on chronic systolic heart failure. Recent EF 35%. IV Lasix, BNP is 540, continue beta blocker & BiDil if she can tolerate oral medications, due to renal insufficiency no ACE/ARB. Have applied Nitropaste.  5. Chronic atrial fibrillation with [place] vasc 2 score of 8. Goal will be rate control, continue beta blocker and amiodarone once she is able to tolerate oral diet, will switch her from Coumadin to Eliquis once she tolerates oral diet and family desires further treatment besides comfort measures, however clinically not doing well for now hold off anticoagulation.   6. Essential hypertension. Continue BiDil and beta blocker if she tolerates oral medications and family desires further treatment.  7. CK D stage IV. Baseline creatinine close to 1.8. Monitor with diuresis. We will continue diuresis with IV Lasix to prevent shortness of breath worsening.  8. History of stroke. Switched her to Eliquis from Coumadin for secondary prevention. If family desires and she is able to tolerate oral medications.  9. Stage II sacral decubitus ulcer. Wound care  consulted.    Code Status : DNR, prognosis poor. Discussed with son in detail on 12/28/2015, he does not want heroics. Palliative care will be consulted. After family meeting with palliative care on 12/30/2015 patient was transitioned to IV morphine with full comfort care.  Family Communication  : None  Disposition Plan  : Residential hospice  Barriers For Discharge :  Acute hypoxic and hypercapnic respiratory failure  Consults  :  PCCM, IR, palliative care  Procedures  :   Left-sided thoracentesis ordered on 12/28/2015 by pulmonary  DVT Prophylaxis  :  Coumadin/SCDs  Lab Results  Component Value Date   PLT 182 12/31/2015    Antibiotics  :   Anti-infectives    Start     Dose/Rate Route Frequency Ordered Stop   12/31/15 1200  ciprofloxacin (CIPRO) IVPB 400 mg  Status:  Discontinued     400 mg 200 mL/hr over 60 Minutes Intravenous Every 12 hours 12/31/15 1157 12/31/15 1158   12/28/15 1115  cefTRIAXone (ROCEPHIN) 1 g in dextrose 5 % 50 mL IVPB     1 g 100 mL/hr over 30 Minutes Intravenous Every 24 hours 12/28/15 1106 12/30/15 1145   12/28/15 0200  aztreonam (AZACTAM) 500 mg in dextrose 5 % 50 mL IVPB  Status:  Discontinued     500 mg 100 mL/hr over 30 Minutes Intravenous Every 8 hours 12/28/15 0140 12/28/15 1105   12/15/2015 2245  vancomycin (VANCOCIN) IVPB 1000 mg/200 mL premix     1,000 mg 200 mL/hr over 60 Minutes Intravenous STAT 12/23/2015 2239 12/28/15 0106   12/16/2015 2245  ceFEPIme (MAXIPIME) 1 g in dextrose 5 % 50 mL IVPB     1 g 100 mL/hr over 30 Minutes Intravenous STAT 12/26/2015 2242 12/20/2015 2354        Objective:   Filed Vitals:   12/29/15 1936 12/30/15 0352 12/30/15 1118 12/30/15 2205  BP: 151/36 141/37 132/93 140/61  Pulse: 61 77 85 89  Temp: 98.5 F (36.9 C) 98.1 F (36.7 C) 97.6 F (36.4 C) 98.5 F (36.9 C)  TempSrc: Oral Oral Oral Axillary  Resp: 18 21 20 18   Height:      Weight:  66.543 kg (146 lb 11.2 oz)    SpO2: 98% 98% 96% 97%    Wt  Readings from Last 3 Encounters:  12/30/15 66.543 kg (146 lb 11.2 oz)     Intake/Output Summary (Last 24 hours) at 12/31/15 1201 Last data filed at 12/31/15 0200  Gross per 24 hour  Intake      0 ml  Output    350 ml  Net   -350 ml     Physical Exam  Mildly somnolent, No new F.N deficits, Normal affect [place], AT,PERRAL Supple Neck,No JVD, No cervical lymphadenopathy appriciated.  Symmetrical Chest wall movement, reduced L sided B sounds RRR,No Gallops,Rubs or new Murmurs, No Parasternal Heave +ve B.Sounds, Abd Soft, No tenderness, No organomegaly appriciated, No rebound - guarding or rigidity. No Cyanosis, Clubbing or edema, No new Rash or bruise     Data Review:    CBC  Recent Labs Lab 12/10/2015 2213 12/28/15 0149 12/29/15 0326 12/30/15 0329 12/31/15 0857  WBC 7.3 7.4 8.1 8.0 13.2*  HGB 10.9* 10.2* 9.2* 9.7* 10.6*  HCT 36.8 34.0* 31.8* 32.5* 36.9  PLT 220 179 148* 141* 182  MCV 98.1 95.8 97.8 98.2 100.8*  MCH 29.1 28.7 28.3 29.3 29.0  MCHC 29.6* 30.0 28.9* 29.8* 28.7*  RDW 14.8 14.8 15.0 15.2 15.1  LYMPHSABS 0.3*  --   --   --   --   MONOABS 0.4  --   --   --   --   EOSABS 0.0  --   --   --   --   BASOSABS 0.0  --   --   --   --     Chemistries  Recent Labs Lab 12/28/2015 2213 12/28/15 0353 12/28/15 1139 12/29/15 0326 12/30/15 0329 12/31/15 0857  NA 141 141  --  145 149* 155*  K 4.1 3.7  --  3.8 3.6 4.4  CL 97* 96*  --  97* 97* 105  CO2 28 32  --  33* 33* 36*  GLUCOSE 107* 133*  --  86 89 93  BUN 47* 49*  --  57* 70* 107*  CREATININE 1.95* 1.91*  --  1.93* 1.86* 2.62*  CALCIUM 9.4 9.2  --  9.2 9.3 9.6  MG  --   --   --   --  2.5*  --   AST 18 18 25   --   --   --   ALT 16 15 13*  --   --   --   ALKPHOS 79 73 68  --   --   --   BILITOT 0.8 0.9 1.3*  --   --   --    ------------------------------------------------------------------------------------------------------------------ No results for input(s): CHOL, HDL, LDLCALC, TRIG, CHOLHDL, LDLDIRECT  in the last 72 hours.  No results found for: HGBA1C ------------------------------------------------------------------------------------------------------------------ No results for input(s): TSH, T4TOTAL, T3FREE, THYROIDAB in the last 72 hours.  Invalid input(s): FREET3 ------------------------------------------------------------------------------------------------------------------ No results for input(s): VITAMINB12, FOLATE, FERRITIN, TIBC, IRON, RETICCTPCT in the last 72 hours.  Coagulation profile  Recent Labs Lab 12/31/2015 2213 12/28/15 0149 12/28/15 0713 12/29/15 0326  INR >10.00* 8.48* 1.79* 1.22    No results for input(s): DDIMER in the last 72 hours.  Cardiac Enzymes No results for input(s): CKMB, TROPONINI, MYOGLOBIN in the last 168 hours.  Invalid input(s): CK ------------------------------------------------------------------------------------------------------------------    Component Value Date/Time   BNP 539.0* 12/14/2015 2213    Inpatient Medications  Scheduled Meds: . amiodarone  200 mg Oral Daily  . furosemide  40 mg Intravenous Q12H  . metoprolol succinate  25 mg Oral BID  . nitroGLYCERIN  0.5 inch Topical Q6H  . sodium chloride flush  3 mL Intravenous Q12H   Continuous Infusions: . morphine 0.5 mg/hr (12/31/15 0940)   PRN Meds:.acetaminophen **OR** acetaminophen, bisacodyl, hydrALAZINE, LORazepam, metoprolol, morphine injection, [DISCONTINUED] ondansetron **OR** ondansetron (ZOFRAN) IV  Micro Results Recent Results (from the past 240 hour(s))  Blood culture (routine x 2)     Status: None (Preliminary result)   Collection Time: 12/15/2015 10:05 PM  Result Value Ref Range Status   Specimen Description BLOOD RIGHT HAND  Final   Special Requests BOTTLES DRAWN AEROBIC ONLY 5CC  Final   Culture NO GROWTH 4 DAYS  Final   Report Status PENDING  Incomplete  Blood culture (routine x 2)     Status: None (Preliminary result)   Collection Time: 12/17/2015  10:12 PM  Result Value Ref Range Status   Specimen Description BLOOD RIGHT FOREARM  Final   Special Requests BOTTLES DRAWN AEROBIC ONLY 5CC  Final   Culture NO GROWTH 4 DAYS  Final   Report Status PENDING  Incomplete  Urine culture     Status: Abnormal (Preliminary result)   Collection Time: 01/04/2016 11:16 PM  Result Value Ref Range Status   Specimen Description URINE, CATHETERIZED  Final   Special Requests Normal  Final   Culture (A)  Final    >=100,000 COLONIES/mL KLEBSIELLA SPECIES 50,000 COLONIES/mL ENTEROCOCCUS SPECIES RULING OUT VANCOMYCIN RESISTANT ENTEROCOCCUS    Report Status PENDING  Incomplete   Organism ID, Bacteria KLEBSIELLA SPECIES (A)  Final      Susceptibility   Klebsiella species - MIC*  AMPICILLIN 16 RESISTANT Resistant     CEFAZOLIN <=4 SENSITIVE Sensitive     CEFTRIAXONE <=1 SENSITIVE Sensitive     CIPROFLOXACIN <=0.25 SENSITIVE Sensitive     GENTAMICIN <=1 SENSITIVE Sensitive     IMIPENEM <=0.25 SENSITIVE Sensitive     NITROFURANTOIN <=16 SENSITIVE Sensitive     TRIMETH/SULFA <=20 SENSITIVE Sensitive     AMPICILLIN/SULBACTAM <=2 SENSITIVE Sensitive     PIP/TAZO <=4 SENSITIVE Sensitive     * >=100,000 COLONIES/mL KLEBSIELLA SPECIES  Culture, body fluid-bottle     Status: None (Preliminary result)   Collection Time: 12/28/15  2:54 PM  Result Value Ref Range Status   Specimen Description FLUID PLEURAL LEFT  Final   Special Requests NONE  Final   Culture NO GROWTH 3 DAYS  Final   Report Status PENDING  Incomplete  Gram stain     Status: None   Collection Time: 12/28/15  2:54 PM  Result Value Ref Range Status   Specimen Description FLUID PLEURAL LEFT  Final   Special Requests NONE  Final   Gram Stain NO WBC SEEN NO ORGANISMS SEEN   Final   Report Status 12/28/2015 FINAL  Final    Radiology Reports Ct Abdomen Pelvis Wo Contrast  12/30/2015  CLINICAL DATA:  Abdominal pain. EXAM: CT ABDOMEN AND PELVIS WITHOUT CONTRAST TECHNIQUE: Multidetector CT  imaging of the abdomen and pelvis was performed following the standard protocol without IV contrast. COMPARISON:  Plain films 12/30/2015 FINDINGS: Lower chest: Large bilateral pleural effusions with compressive atelectasis in the lower lobes. Consolidation noted in the lingula concerning for pneumonia. Heart is enlarged. Pacer wires noted in the right heart. Hepatobiliary: No visible focal abnormality.  Prior cholecystectomy. Pancreas: Unremarkable unenhanced appearance Spleen: Calcifications throughout the spleen compatible with old granulomatous disease. Adrenals/Urinary Tract: Mild fullness of the renal collecting systems bilaterally. Ureters are decompressed. Gallbladder wall appears thickened which could be related to cystitis. Foley catheter in place. No renal stones. No visible focal renal abnormality on this unenhanced study. Areas of scarring in the left kidney. Adrenal glands are unremarkable. Stomach/Bowel: Stomach, large and small bowel decompressed and grossly unremarkable. Vascular/Lymphatic: Heavily calcified aorta. No aneurysm. No adenopathy. Reproductive: Prior hysterectomy.  No adnexal masses. Other: No free fluid or free air. Musculoskeletal: No acute bony abnormality or focal bone lesion. Degenerative changes throughout the visualized thoracolumbar spine. IMPRESSION: Large bilateral pleural effusions with compressive atelectasis in the lower lobes. Consolidation in the lingula is concerning for pneumonia. Cardiomegaly. Old granulomatous disease within the spleen. Mild fullness of the renal collecting systems bilaterally without pain for to hydronephrosis. Ureters are decompressed. Mild bladder wall thickening may reflect cystitis. Electronically Signed   By: Rolm Baptise M.D.   On: 12/30/2015 10:27   Dg Chest 1 View  12/28/2015  CLINICAL DATA:  Post left sided thoracentesis. Hx CHF, breast CA. Pt not very responsive, hardly able to be awakened. Unable to provide any hx or current symptoms  EXAM: CHEST 1 VIEW COMPARISON:  Chest CT obtained today. Prior chest radiograph, 12/22/2015. FINDINGS: There has been a significant reduction in fluid following left-sided thoracentesis. Small to moderate pleural effusion persists at the lung base. Left lung has expanded. No pneumothorax evident on this supine study. Hazy airspace opacity is noted throughout most of the right lung sparing the apex. Persistent moderate right pleural effusion. IMPRESSION: 1. No evidence of a pneumothorax following thoracentesis. 2. Significant decreased fluid on the left with partial re-expansion of the left lung. 3. New hazy airspace opacity  in the right lung. This is consistent with layering pleural fluid and atelectasis, which was present on the CT. Electronically Signed   By: Lajean Manes M.D.   On: 12/28/2015 15:23   Dg Chest 2 View  12/16/2015  CLINICAL DATA:  Shortness of breath for a few days. Generalize weakness. EXAM: CHEST  2 VIEW COMPARISON:  None. FINDINGS: There is complete opacification of the left hemi thorax. In the absence of prior surgery, this could represent either or a combination of diffuse consolidation, large pleural effusion, and atelectasis. Volume loss on the left with mild hyperinflation on the right. There is a moderate-sized right pleural effusion. There is focal infiltration in the right lower lung which could represent pneumonia or atelectasis. Heart size is obscured by the parenchymal process. No definite vascular congestion on the right. Calcification of the aorta. Degenerative changes in the spine. Cardiac pacemaker. IMPRESSION: Complete opacification of the left hemi thorax. Moderate size right pleural effusion with basilar atelectasis or infiltration. Electronically Signed   By: Lucienne Capers M.D.   On: 12/21/2015 21:56   Ct Chest Wo Contrast  12/28/2015  CLINICAL DATA:  Shortness of breath for 2 days and weakness. EXAM: CT CHEST WITHOUT CONTRAST TECHNIQUE: Multidetector CT imaging of  the chest was performed following the standard protocol without IV contrast. COMPARISON:  Chest radiograph 12/16/2015 FINDINGS: Examination is technically limited due streak artifact arising from the cardiac pacemaker in from the patient's arms. Cardiac enlargement. Coronary artery calcifications. Mildly dilated ascending thoracic aorta at 3.5 cm diameter. Aortic calcifications are present. Prominent central pulmonary arteries may represent arterial hypertension. Calcified granuloma in the right lung with calcified right hilar lymph nodes. Enlarged pretracheal lymph node measures 1.3 cm short axis dimension. Diffusely enlarged and heterogeneous thyroid gland with scattered calcifications and calcified nodules. Consider follow-up with elective ultrasound for further evaluation. Esophagus is decompressed. There is a large left pleural effusion with complete collapse and consolidation of the left lung. Material is present throughout the left mainstem and visualize central left bronchi. This is likely due to secretions. An endobronchial obstructing lesion is not excluded. Moderate size right pleural effusion with atelectasis or consolidation in the right lung base. Patchy airspace disease throughout the right lung. No pneumothorax. Included portions of the upper abdominal organs demonstrate surgical absence of the gallbladder. Degenerative changes throughout the spine. No destructive bone lesions. IMPRESSION: Large left pleural effusion with complete collapse and consolidation of the left lung. Secretions filled the left bronchi. Changes likely represent consolidated pneumonia although endobronchial obstructing lesion is not excluded. Moderate right pleural effusion with atelectasis and infiltration in the right lung. Diffusely enlarged and heterogeneous nodular thyroid gland with calcified nodules. Electronically Signed   By: Lucienne Capers M.D.   On: 12/28/2015 03:38   Dg Abd Portable 1v  12/30/2015  CLINICAL  DATA:  Nausea, abdominal pain EXAM: PORTABLE ABDOMEN - 1 VIEW COMPARISON:  Chest x-ray same day FINDINGS: There is normal small bowel gas pattern. Degenerative changes lumbar spine. Small bilateral pleural effusion with bilateral basilar atelectasis. Some colonic gas noted in distal colon without colonic distension. IMPRESSION: Normal small bowel gas pattern. Electronically Signed   By: Lahoma Crocker M.D.   On: 12/30/2015 09:59   US Thoracentesis Asp Pleural Space W/img Guide  12/28/2015  CLINICAL DATA:  Shortness of breath.  Large left pleural effusion. EXAM: EXAM THORACENTESIS WITH ULTRASOUND TECHNIQUE: The procedure, risks (including but not limited to bleeding, infection, organ damage ), benefits, and alternatives were explained to the  family. Questions regarding the procedure were encouraged and answered. The family understands and consents to the procedure. Survey ultrasound of the left hemithorax was performed and an appropriate skin entry site was localized. Site was marked, prepped with Betadine, draped in usual sterile fashion, infiltrated locally with 1% lidocaine. The Saf-T-Centesis needle was advanced into the pleural space. Bloody fluid returned. 1.3 L was removed. Samples sent for the requested laboratory studies. The patient tolerated procedure well. COMPLICATIONS: COMPLICATIONS None immediate IMPRESSION: Technically successful ultrasound-guided left thoracentesis. Follow-up chest radiograph pending. Electronically Signed   By: Lucrezia Europe M.D.   On: 12/28/2015 15:12    Time Spent in minutes  30   Kenaz Olafson K M.D on 12/31/2015 at 12:01 PM  Between 7am to 7pm - Pager - [phone]  After 7pm go to [email or web address] - password [facility]  [facility] -  Office  [phone]

## 2015-12-31 NOTE — Progress Notes (Signed)
Pt was calm and comfortable throughout the night, Morphine drip continue @1ml /hr, positioning and pt's comfort measures applied throughout the time, daughter is in bedside and aware about the situation, will continue to monitor the patient.

## 2015-12-31 NOTE — Consult Note (Signed)
   [facility] CM Inpatient Consult   12/31/2015  Yolanda Spears 1935-05-09 [id number] Call received from [place] that this patient was admitted. ACO Registry was accessed to check for eligibility.  Chart review reveals in a separate medical record that this patient was active with [facility].  Chart review reveals patient is now full comfort care and will no longer be appropriate for [facility] Care Management services. [facility] will close this case from active as this patient will receive full care management services from Hospice.  For questions, please contact: Natividad Brood, RN BSN [facility] Liaison  [phone] business mobile phone Toll free office [phone]

## 2016-01-01 DIAGNOSIS — Z515 Encounter for palliative care: Secondary | ICD-10-CM

## 2016-01-01 DIAGNOSIS — R0600 Dyspnea, unspecified: Secondary | ICD-10-CM

## 2016-01-01 DIAGNOSIS — J9612 Chronic respiratory failure with hypercapnia: Secondary | ICD-10-CM

## 2016-01-01 DIAGNOSIS — I10 Essential (primary) hypertension: Secondary | ICD-10-CM

## 2016-01-01 DIAGNOSIS — R52 Pain, unspecified: Secondary | ICD-10-CM

## 2016-01-01 DIAGNOSIS — Z789 Other specified health status: Secondary | ICD-10-CM

## 2016-01-01 DIAGNOSIS — Z7189 Other specified counseling: Secondary | ICD-10-CM

## 2016-01-01 DIAGNOSIS — J9611 Chronic respiratory failure with hypoxia: Secondary | ICD-10-CM

## 2016-01-01 LAB — URINE CULTURE
Culture: >=100000 — AB
Special Requests: NORMAL

## 2016-01-01 LAB — CULTURE, BLOOD (ROUTINE X 2)
CULTURE: NO GROWTH
Culture: NO GROWTH

## 2016-01-01 MED ORDER — MORPHINE SULFATE (PF) 2 MG/ML IV SOLN: 1.0000 mg | INTRAVENOUS | Status: DC | PRN

## 2016-01-02 LAB — CULTURE, BODY FLUID-BOTTLE: CULTURE: NO GROWTH

## 2016-01-02 LAB — CULTURE, BODY FLUID W GRAM STAIN -BOTTLE

## 2016-01-04 ENCOUNTER — Encounter: Payer: Self-pay | Admitting: *Deleted

## 2016-01-04 ENCOUNTER — Other Ambulatory Visit: Payer: Self-pay | Admitting: *Deleted

## 2016-01-04 NOTE — Patient Outreach (Signed)
[facility]) Care Management  01/04/2016  Yolanda Spears [birth year] [id number]   Assessment: Case closure Noted in chart (EMR), patient deceased 01-02-2016 at 1:09 pm.  Plan: Will close case. Will notify primary care provider of case closure.  Shahd Occhipinti A. Beyla Loney, BSN, RN-BC [place] Management Coordinator Cell: [phone]

## 2016-01-05 NOTE — Progress Notes (Signed)
This encounter was created in error - please disregard.

## 2016-01-06 ENCOUNTER — Encounter: Payer: Self-pay | Admitting: Cardiovascular Disease

## 2016-01-06 ENCOUNTER — Ambulatory Visit: Payer: Self-pay | Admitting: Internal Medicine

## 2016-01-06 DIAGNOSIS — I4819 Other persistent atrial fibrillation: Secondary | ICD-10-CM

## 2016-01-06 DIAGNOSIS — Z5181 Encounter for therapeutic drug level monitoring: Secondary | ICD-10-CM

## 2016-01-06 NOTE — Progress Notes (Signed)
Daily Progress Note   Patient Name: Yolanda Spears       Date: 2016-01-10 DOB: Jan 20, 1935  Age: 81 y.o. MRN#: [id number] Attending Physician: Mendel Corning, MD Primary Care Physician: No primary care provider on file. Admit Date: 12/14/2015  Reason for Consultation/Follow-up: Terminal Care  Subjective: Yolanda Spears is a 81 yo female admitted with acute on chronic respiratory failure, CHF exacerbation, CKD IV, and acute encephalopathy secondary to hypoxia and hypercarbia. Patient was transitioned to full comfort care yesterday morning. There were no acute events overnight. Family is requesting to transfer to [facility], stop vital sign checks and bed turns. Given patient's increased work of breath, we discussed increasing morphine gtt.   Length of Stay: 4 days  Current Medications: Scheduled Meds:  . nitroGLYCERIN  0.5 inch Topical Q6H  . sodium chloride flush  3 mL Intravenous Q12H    Continuous Infusions: . morphine 0.5 mg/hr (12/31/15 0940)    PRN Meds: glycopyrrolate **OR** glycopyrrolate **OR** glycopyrrolate, LORazepam, morphine injection, [DISCONTINUED] ondansetron **OR** ondansetron (ZOFRAN) IV  Physical Exam    Filed Vitals:   12/30/15 0352 12/30/15 1118 12/30/15 2205 2016-01-10 0159  BP: 141/37 132/93 140/61 130/50  Pulse: 77 85 89 97  Temp: 98.1 F (36.7 C) 97.6 F (36.4 C) 98.5 F (36.9 C) 101.2 F (38.4 C)  TempSrc: Oral Oral Axillary Oral  Resp: 21 20 18 18   Height:      Weight: 146 lb 11.2 oz (66.543 kg)     SpO2: 98% 96% 97% 98%   General: Vital signs reviewed.  Patient is elderly, ill appearing, in no acute distress   Cardiovascular: Irregularly irregular Pulmonary/Chest: Increased work of breathing, tachypnea, shallow breaths Extremities: Warm extremities,  no mottling Neurological: Unresponsive  Intake/output summary: No intake or output data in the 24 hours ending Jan 10, 2016 0847 LBM: Last BM Date: 12/28/15 Baseline Weight: Weight: 165 lb (74.844 kg) Most recent weight: Weight: 146 lb 11.2 oz (66.543 kg)       Palliative Assessment/Data:      Patient Active Problem List   Diagnosis Date Noted  . Chronic respiratory failure with hypoxia and hypercapnia ([place]) 12/29/2015  . Acute encephalopathy 12/28/2015  . SOB (shortness of breath) 12/28/2015  . Pleural effusion, bilateral 12/28/2015  . UTI (lower urinary tract infection) 12/28/2015  . Supratherapeutic INR 12/28/2015  .  Pleural effusion 12/28/2015  . Pressure ulcer 12/28/2015  . Breast cancer ([place])   . Essential hypertension   . HLD (hyperlipidemia)   . Systolic congestive heart failure ([place])   . A-fib ([place])   . CKD (chronic kidney disease), stage III   . Stroke (cerebrum) ([place])   . Chronic systolic congestive heart failure [facility])     Palliative Care Assessment & Plan   Patient Profile: 81 yo female with acute on chronic respiratory failure and acute encephalopathy now on full comfort care.  Assessment: Acute on Chronic Respiratory Failure Acute Encephalopathy End of Life Care  Recommendations/Plan:  Continue Full Comfort Care  Transfer to [address] [facility]  Increase Morphine gtt to 1 mg/hr due to increased work of breathing, continue Robinul prn secretions, ativan prn anxiety, Zofran prn nausea  Goals of Care and Additional Recommendations:  Limitations on Scope of Treatment: Full Comfort Care, Minimize Medications, No Diagnostics, No IV Antibiotics, No IV Fluids and No Lab Draws  Code Status:    Code Status Orders        Start     Ordered   12/31/15 1249  Do not attempt resuscitation (DNR)   Continuous    Question Answer Comment  In the event of cardiac or respiratory ARREST Do not call a "code blue"   In the event of cardiac or respiratory ARREST Do not  perform Intubation, CPR, defibrillation or ACLS   In the event of cardiac or respiratory ARREST Use medication by any route, position, wound care, and other measures to relive pain and suffering. May use oxygen, suction and manual treatment of airway obstruction as needed for comfort.      12/31/15 1251    Code Status History    Date Active Date Inactive Code Status Order ID Comments User Context   12/28/2015  1:06 AM 12/31/2015 12:51 PM DNR [place]:[id number]  Ivor Costa, MD ED    Advance Directive Documentation        Most Recent Value   Type of Advance Directive  Healthcare Power of Attorney, Living will   Pre-existing out of facility DNR order (yellow form or pink MOST form)     "MOST" Form in Place?         Prognosis:   Hours - Days  Discharge Planning:  Anticipated Hospital Death  Care plan was discussed with Vinie Sill, NP.   Thank you for allowing the Palliative Medicine Team to assist in the care of this patient.   Time In: 8:00 am Time Out: 8:37 Total Time 37 minutes Prolonged Time Billed  no       Greater than 50%  of this time was spent counseling and coordinating care related to the above assessment and plan.  Martyn Malay, DO PGY-2 Internal Medicine Resident Pager # [phone] 01/24/16 8:47 AM  Please contact Palliative Medicine Team phone at [phone] for questions and concerns.

## 2016-01-06 NOTE — Discharge Summary (Addendum)
Expiration Note/ Death Summary  Yolanda Spears  MR#: [id number]  DOB:21-Nov-1934  Date of Admission: 01/17/2016 Date of Death: 01-22-16 at 1:09PM  Attending Physician:RAI,RIPUDEEP  Patient's PCP: No primary care provider on file.  Consults:  palliative medicine Interventional radiology Pulmonology  Cause of Death: Acute hypoxic and hypercapnic respiratory failure  Secondary Diagnoses  . acute on chronic Systolic congestive heart failure ([place]) . Sepsis .  Klebsiella UTI  .  Chronic atrial fibrillation  . Acute encephalopathy  . Pleural effusion, bilateral . Stroke (cerebrum) ([place]) . Essential hypertension . CKD (chronic kidney disease), stage III . Supratherapeutic INR . Pressure ulcer . Chronic respiratory failure with hypoxia and hypercapnia ([facility])   Brief H and P: For complete details please refer to admission H and P, but in brief Patient was a 81 year old female with hypertension, diet-controlled diabetes, systolic CHF, EF 123456, CA breast, atrial fibrillation on Coumadin, CAD stage III, CVA, C. difficile colitis, pacemaker, presented with altered mental status and shortness of breath at the time of admission. Pt was found to have supratherapeutic INR>10, lactate 1.49, negative troponin, BNP 539, positive urinalysis with large amount of leukocytes, stable renal function, temperature normal, no tachycardia, oxygen desaturation to 66%, which improved to 100% on nasal cannula oxygen. Chest x-ray showed complete opacification of the left hemi thorax; moderate size right pleural effusion with basilar atelectasis or infiltration. Patient was admitted to inpatient for further eval and treatment.  Hospital Course: Acute hypoxic and hypercapnic respiratory failure Likely due to acute on chronic CHF with pleural effusion.  - Pulmonology was consulted however patient was DNR, afebrile, no leukocytosis, hence antibiotics were discontinued. Patient was placed on diuresis for CHF and  pleural effusion. She underwent diagnostic and therapeutic left-sided paracentesis on 12/28/2015 with 1.5 L of transudative fluid removed however overall not much improved - Palliative medicine was consulted and after detailed discussions with the patient's family, she was placed on comfort care status.  Acute encephalopathy. Likely combination of hypercapnia and initial hypoxia, urinary tract infection, overall had no significant improvement during hospitalization..  - Patient was placed on comfort care status goals and required morphine drip to alleviate end-of-life symptoms   Sepsis secondary to UTI :   - Urine Cultures showed Klebsiella pneumonia and 50,000 colonies of VRE. Blood cultures and pleural fluid cultures remained negative, patient completed Rocephin last dose 12/31/2015.  Acute on chronic systolic heart failure. Recent EF 35% - Patient was placed on IV diuresis with no significant improvement, was placed on comfort goal of care  Chronic atrial fibrillation  - patient was on Coumadin prior to admission  Essential hypertension  CK D stage IV. Baseline creatinine close to 1.8, Worsened to 2.6 on 4/25. Patient was placed on comfort care status  History of stroke  Stage II sacral decubitus ulcer. Wound care was consulted.  Patient passed on 01-22-16 at 13:09  Signed:  RAI,RIPUDEEP M.D. [facility] 01-22-16, 1:25 PM Pager: [phone]  Coding query  Patient had UTI at the time of admission. Blood cultures remained negative, hence sepsis ruled out.    RAI,RIPUDEEP M.D. [facility] 01/16/2016, 3:50 PM  Pager: [phone]

## 2016-01-06 NOTE — Progress Notes (Signed)
Pt comfortable multiple family at the bedside. Family request to stay on the unit vs being transferred off the unit.

## 2016-01-06 NOTE — Care Management Note (Signed)
Case Management Note  Patient Details  Name: Yolanda Spears MRN: [id number] Date of Birth: 01/27/1935  Subjective/Objective:   Pt admitted with SOB                 Action/Plan:   24-Jan-2016 Plan is for hospital death/planned transfer to palliative floor - not appropriate for GIP  12/30/15 Pt is from home with caregiver; current plan is for pt to discharge to [place].  CSW consulted   Expected Discharge Date:  12/31/15               Expected Discharge Plan:  [place]  In-House Referral:  Clinical Social Work  Discharge planning Services     Post Acute Care Choice:    Choice offered to:     DME Arranged:    DME Agency:     HH Arranged:    HH Agency:     Status of Service:  In process, will continue to follow  Medicare Important Message Given:  Yes Date Medicare IM Given:    Medicare IM give by:    Date Additional Medicare IM Given:    Additional Medicare Important Message give by:     If discussed at [place] of Stay Meetings, dates discussed:    Additional Comments:  Maryclare Labrador, RN 2016/01/24, 10:42 AM

## 2016-01-06 NOTE — Progress Notes (Signed)
Large family present with patient, demonstrated various aspects of grief. Chaplain sat with them for a while, then offered a blessing for the family and patient, as the family had indicated the patient was not religious. Very warm family dynamics and welcoming of chaplain's support. Would like to be checked on as the day wears on. Rudene Poulsen, Chaplain

## 2016-01-06 NOTE — Progress Notes (Signed)
[facility]                                                                              Patient Demographics  Yolanda Spears, is a 81 y.o. female, DOB - 09-01-1935, OK:[id number]  Admit date - 12/18/2015   Admitting Physician Ivor Costa, MD  Outpatient Primary MD for the patient is No primary care provider on file.  Outpatient specialists:   LOS - 4  days    Chief Complaint  Patient presents with  . Shortness of Breath       Brief summary  Patient is a 81 year old female with hypertension, diet-controlled diabetes, systolic CHF, EF 123456, CA breast, atrial fibrillation on Coumadin, CAD stage III, CVA, C. difficile colitis, pacemaker, presented with altered mental status and shortness of breath at the time of admission. Pt was found to have supratherapeutic INR>10, lactate 1.49, negative troponin, BNP 539, positive urinalysis with large amount of leukocytes, stable renal function, temperature normal, no tachycardia, oxygen desaturation to 66%, which improved to 100% on nasal cannula oxygen. Chest x-ray showed complete opacification of the left hemi thorax; moderate size right pleural effusion with basilar atelectasis or infiltration. Patient was admitted to inpatient for further eval and treatment.    Assessment & Plan    Acute hypoxic and hypercapnic respiratory failure Likely due to acute on chronic CHF with pleural effusion.  - Pulmonology was consulted however patient DNR, afebrile, no leukocytosis, hence antibiotics were discontinued. Patient was placed on diuresis for CHF and pleural effusion.  - She underwent diagnostic and therapeutic left-sided paracentesis on 12/28/2015 with 1.5 L of transudative fluid removed however overall not much improved - Palliative medicine was consulted and patient was placed on comfort care status after detailed discussion with patient's family .   Acute encephalopathy. Likely combination of hypercapnia and initial hypoxia,  overall had no significant improvement during hospitalization..  - Currently comfort care status on morphine drip   UTI. - Urine Cultures showed Klebsiella pneumonia and 50,000 colonies of VRE  -Blood cultures and pleural fluid cultures remain negative, patient completed Rocephin last dose 12/31/2015.  Acute on chronic systolic heart failure. Recent EF 35% - Patient was placed on IV diuresis with no significant improvement, currently on comfort goal of care  Chronic atrial fibrillation  -  patient was on Coumadin prior to admission, at this time on comfort measures   Essential hypertension  CK D stage IV. Baseline creatinine close to 1.8, Worsened to 2.6 on 4/25, currently comfort care  History of stroke.  Stage II sacral decubitus ulcer. Wound care was consulted.  Code Status: DNR comfort care  Family Communication: Discussed in detail with the patient, all imaging results, lab results explained to the patient's multiple family members at the bedside  Disposition Plan: Transfer to palliative floor   Time Spent in minutes   15 minutes  Procedures  None   Consults   Pulmonology  Wound care  Palliative medicine   DVT Prophylaxis  comfort care goals  Medications  Scheduled Meds: . nitroGLYCERIN  0.5 inch Topical Q6H  . sodium chloride flush  3 mL Intravenous Q12H   Continuous  Infusions: . morphine 1 mg/hr (01/28/2016 0850)   PRN Meds:.glycopyrrolate **OR** glycopyrrolate **OR** glycopyrrolate, LORazepam, morphine injection, [DISCONTINUED] ondansetron **OR** ondansetron (ZOFRAN) IV   Antibiotics   Anti-infectives    Start     Dose/Rate Route Frequency Ordered Stop   12/31/15 1200  ciprofloxacin (CIPRO) IVPB 400 mg  Status:  Discontinued     400 mg 200 mL/hr over 60 Minutes Intravenous Every 12 hours 12/31/15 1157 12/31/15 1158   12/28/15 1115  cefTRIAXone (ROCEPHIN) 1 g in dextrose 5 % 50 mL IVPB     1 g 100 mL/hr over 30 Minutes Intravenous Every 24 hours  12/28/15 1106 12/30/15 1145   12/28/15 0200  aztreonam (AZACTAM) 500 mg in dextrose 5 % 50 mL IVPB  Status:  Discontinued     500 mg 100 mL/hr over 30 Minutes Intravenous Every 8 hours 12/28/15 0140 12/28/15 1105   12/13/2015 2245  vancomycin (VANCOCIN) IVPB 1000 mg/200 mL premix     1,000 mg 200 mL/hr over 60 Minutes Intravenous STAT 12/28/2015 2239 12/28/15 0106   12/10/2015 2245  ceFEPIme (MAXIPIME) 1 g in dextrose 5 % 50 mL IVPB     1 g 100 mL/hr over 30 Minutes Intravenous STAT 01/04/2016 2242 12/22/2015 2354        Subjective:   Tatumn Swiecicki was seen and examined today.  Unresponsive, on morphine drip.  Objective:   Filed Vitals:   12/30/15 0352 12/30/15 1118 12/30/15 2205 01/28/2016 0159  BP: 141/37 132/93 140/61 130/50  Pulse: 77 85 89 97  Temp: 98.1 F (36.7 C) 97.6 F (36.4 C) 98.5 F (36.9 C) 101.2 F (38.4 C)  TempSrc: Oral Oral Axillary Oral  Resp: 21 20 18 18   Height:      Weight: 66.543 kg (146 lb 11.2 oz)     SpO2: 98% 96% 97% 98%    Intake/Output Summary (Last 24 hours) at 28-Jan-2016 1214 Last data filed at 01/28/2016 1014  Gross per 24 hour  Intake      0 ml  Output    250 ml  Net   -250 ml     Wt Readings from Last 3 Encounters:  12/30/15 66.543 kg (146 lb 11.2 oz)     Exam Patient's family declined  physical exam   General:Unresponsive      Data Reviewed:  I have personally reviewed following labs and imaging studies  Micro Results Recent Results (from the past 240 hour(s))  Blood culture (routine x 2)     Status: None (Preliminary result)   Collection Time: 12/20/2015 10:05 PM  Result Value Ref Range Status   Specimen Description BLOOD RIGHT HAND  Final   Special Requests BOTTLES DRAWN AEROBIC ONLY 5CC  Final   Culture NO GROWTH 4 DAYS  Final   Report Status PENDING  Incomplete  Blood culture (routine x 2)     Status: None (Preliminary result)   Collection Time: 12/17/2015 10:12 PM  Result Value Ref Range Status   Specimen Description BLOOD  RIGHT FOREARM  Final   Special Requests BOTTLES DRAWN AEROBIC ONLY 5CC  Final   Culture NO GROWTH 4 DAYS  Final   Report Status PENDING  Incomplete  Urine culture     Status: Abnormal   Collection Time: 12/31/2015 11:16 PM  Result Value Ref Range Status   Specimen Description URINE, CATHETERIZED  Final   Special Requests Normal  Final   Culture (A)  Final    >=100,000 COLONIES/mL KLEBSIELLA PNEUMONIAE 50,000 COLONIES/mL VANCOMYCIN RESISTANT  ENTEROCOCCUS    Report Status 01-16-16 FINAL  Final   Organism ID, Bacteria KLEBSIELLA PNEUMONIAE (A)  Final   Organism ID, Bacteria VANCOMYCIN RESISTANT ENTEROCOCCUS (A)  Final      Susceptibility   Klebsiella pneumoniae - MIC*    AMPICILLIN 16 RESISTANT Resistant     CEFAZOLIN <=4 SENSITIVE Sensitive     CEFTRIAXONE <=1 SENSITIVE Sensitive     CIPROFLOXACIN <=0.25 SENSITIVE Sensitive     GENTAMICIN <=1 SENSITIVE Sensitive     IMIPENEM <=0.25 SENSITIVE Sensitive     NITROFURANTOIN <=16 SENSITIVE Sensitive     TRIMETH/SULFA <=20 SENSITIVE Sensitive     AMPICILLIN/SULBACTAM <=2 SENSITIVE Sensitive     PIP/TAZO <=4 SENSITIVE Sensitive     * >=100,000 COLONIES/mL KLEBSIELLA PNEUMONIAE   Vancomycin resistant enterococcus - MIC*    AMPICILLIN <=2 SENSITIVE Sensitive     LEVOFLOXACIN 0.5 SENSITIVE Sensitive     NITROFURANTOIN <=16 SENSITIVE Sensitive     VANCOMYCIN >=32 RESISTANT Resistant     LINEZOLID 2 SENSITIVE Sensitive     * 50,000 COLONIES/mL VANCOMYCIN RESISTANT ENTEROCOCCUS  Culture, body fluid-bottle     Status: None (Preliminary result)   Collection Time: 12/28/15  2:54 PM  Result Value Ref Range Status   Specimen Description FLUID PLEURAL LEFT  Final   Special Requests NONE  Final   Culture NO GROWTH 3 DAYS  Final   Report Status PENDING  Incomplete  Gram stain     Status: None   Collection Time: 12/28/15  2:54 PM  Result Value Ref Range Status   Specimen Description FLUID PLEURAL LEFT  Final   Special Requests NONE  Final    Gram Stain NO WBC SEEN NO ORGANISMS SEEN   Final   Report Status 12/28/2015 FINAL  Final    Radiology Reports Ct Abdomen Pelvis Wo Contrast  12/30/2015  CLINICAL DATA:  Abdominal pain. EXAM: CT ABDOMEN AND PELVIS WITHOUT CONTRAST TECHNIQUE: Multidetector CT imaging of the abdomen and pelvis was performed following the standard protocol without IV contrast. COMPARISON:  Plain films 12/30/2015 FINDINGS: Lower chest: Large bilateral pleural effusions with compressive atelectasis in the lower lobes. Consolidation noted in the lingula concerning for pneumonia. Heart is enlarged. Pacer wires noted in the right heart. Hepatobiliary: No visible focal abnormality.  Prior cholecystectomy. Pancreas: Unremarkable unenhanced appearance Spleen: Calcifications throughout the spleen compatible with old granulomatous disease. Adrenals/Urinary Tract: Mild fullness of the renal collecting systems bilaterally. Ureters are decompressed. Gallbladder wall appears thickened which could be related to cystitis. Foley catheter in place. No renal stones. No visible focal renal abnormality on this unenhanced study. Areas of scarring in the left kidney. Adrenal glands are unremarkable. Stomach/Bowel: Stomach, large and small bowel decompressed and grossly unremarkable. Vascular/Lymphatic: Heavily calcified aorta. No aneurysm. No adenopathy. Reproductive: Prior hysterectomy.  No adnexal masses. Other: No free fluid or free air. Musculoskeletal: No acute bony abnormality or focal bone lesion. Degenerative changes throughout the visualized thoracolumbar spine. IMPRESSION: Large bilateral pleural effusions with compressive atelectasis in the lower lobes. Consolidation in the lingula is concerning for pneumonia. Cardiomegaly. Old granulomatous disease within the spleen. Mild fullness of the renal collecting systems bilaterally without pain for to hydronephrosis. Ureters are decompressed. Mild bladder wall thickening may reflect cystitis.  Electronically Signed   By: Rolm Baptise M.D.   On: 12/30/2015 10:27   Dg Chest 1 View  12/28/2015  CLINICAL DATA:  Post left sided thoracentesis. Hx CHF, breast CA. Pt not very responsive, hardly able to be awakened.  Unable to provide any hx or current symptoms EXAM: CHEST 1 VIEW COMPARISON:  Chest CT obtained today. Prior chest radiograph, 01/04/2016. FINDINGS: There has been a significant reduction in fluid following left-sided thoracentesis. Small to moderate pleural effusion persists at the lung base. Left lung has expanded. No pneumothorax evident on this supine study. Hazy airspace opacity is noted throughout most of the right lung sparing the apex. Persistent moderate right pleural effusion. IMPRESSION: 1. No evidence of a pneumothorax following thoracentesis. 2. Significant decreased fluid on the left with partial re-expansion of the left lung. 3. New hazy airspace opacity in the right lung. This is consistent with layering pleural fluid and atelectasis, which was present on the CT. Electronically Signed   By: Lajean Manes M.D.   On: 12/28/2015 15:23   Dg Chest 2 View  12/23/2015  CLINICAL DATA:  Shortness of breath for a few days. Generalize weakness. EXAM: CHEST  2 VIEW COMPARISON:  None. FINDINGS: There is complete opacification of the left hemi thorax. In the absence of prior surgery, this could represent either or a combination of diffuse consolidation, large pleural effusion, and atelectasis. Volume loss on the left with mild hyperinflation on the right. There is a moderate-sized right pleural effusion. There is focal infiltration in the right lower lung which could represent pneumonia or atelectasis. Heart size is obscured by the parenchymal process. No definite vascular congestion on the right. Calcification of the aorta. Degenerative changes in the spine. Cardiac pacemaker. IMPRESSION: Complete opacification of the left hemi thorax. Moderate size right pleural effusion with basilar  atelectasis or infiltration. Electronically Signed   By: Lucienne Capers M.D.   On: 12/26/2015 21:56   Ct Chest Wo Contrast  12/28/2015  CLINICAL DATA:  Shortness of breath for 2 days and weakness. EXAM: CT CHEST WITHOUT CONTRAST TECHNIQUE: Multidetector CT imaging of the chest was performed following the standard protocol without IV contrast. COMPARISON:  Chest radiograph 12/11/2015 FINDINGS: Examination is technically limited due streak artifact arising from the cardiac pacemaker in from the patient's arms. Cardiac enlargement. Coronary artery calcifications. Mildly dilated ascending thoracic aorta at 3.5 cm diameter. Aortic calcifications are present. Prominent central pulmonary arteries may represent arterial hypertension. Calcified granuloma in the right lung with calcified right hilar lymph nodes. Enlarged pretracheal lymph node measures 1.3 cm short axis dimension. Diffusely enlarged and heterogeneous thyroid gland with scattered calcifications and calcified nodules. Consider follow-up with elective ultrasound for further evaluation. Esophagus is decompressed. There is a large left pleural effusion with complete collapse and consolidation of the left lung. Material is present throughout the left mainstem and visualize central left bronchi. This is likely due to secretions. An endobronchial obstructing lesion is not excluded. Moderate size right pleural effusion with atelectasis or consolidation in the right lung base. Patchy airspace disease throughout the right lung. No pneumothorax. Included portions of the upper abdominal organs demonstrate surgical absence of the gallbladder. Degenerative changes throughout the spine. No destructive bone lesions. IMPRESSION: Large left pleural effusion with complete collapse and consolidation of the left lung. Secretions filled the left bronchi. Changes likely represent consolidated pneumonia although endobronchial obstructing lesion is not excluded. Moderate right  pleural effusion with atelectasis and infiltration in the right lung. Diffusely enlarged and heterogeneous nodular thyroid gland with calcified nodules. Electronically Signed   By: Lucienne Capers M.D.   On: 12/28/2015 03:38   Dg Abd Portable 1v  12/30/2015  CLINICAL DATA:  Nausea, abdominal pain EXAM: PORTABLE ABDOMEN - 1 VIEW COMPARISON:  Chest  x-ray same day FINDINGS: There is normal small bowel gas pattern. Degenerative changes lumbar spine. Small bilateral pleural effusion with bilateral basilar atelectasis. Some colonic gas noted in distal colon without colonic distension. IMPRESSION: Normal small bowel gas pattern. Electronically Signed   By: Lahoma Crocker M.D.   On: 12/30/2015 09:59   US Thoracentesis Asp Pleural Space W/img Guide  12/28/2015  CLINICAL DATA:  Shortness of breath.  Large left pleural effusion. EXAM: EXAM THORACENTESIS WITH ULTRASOUND TECHNIQUE: The procedure, risks (including but not limited to bleeding, infection, organ damage ), benefits, and alternatives were explained to the family. Questions regarding the procedure were encouraged and answered. The family understands and consents to the procedure. Survey ultrasound of the left hemithorax was performed and an appropriate skin entry site was localized. Site was marked, prepped with Betadine, draped in usual sterile fashion, infiltrated locally with 1% lidocaine. The Saf-T-Centesis needle was advanced into the pleural space. Bloody fluid returned. 1.3 L was removed. Samples sent for the requested laboratory studies. The patient tolerated procedure well. COMPLICATIONS: COMPLICATIONS None immediate IMPRESSION: Technically successful ultrasound-guided left thoracentesis. Follow-up chest radiograph pending. Electronically Signed   By: Lucrezia Europe M.D.   On: 12/28/2015 15:12    CBC  Recent Labs Lab 12/19/2015 2213 12/28/15 0149 12/29/15 0326 12/30/15 0329 12/31/15 0857  WBC 7.3 7.4 8.1 8.0 13.2*  HGB 10.9* 10.2* 9.2* 9.7* 10.6*    HCT 36.8 34.0* 31.8* 32.5* 36.9  PLT 220 179 148* 141* 182  MCV 98.1 95.8 97.8 98.2 100.8*  MCH 29.1 28.7 28.3 29.3 29.0  MCHC 29.6* 30.0 28.9* 29.8* 28.7*  RDW 14.8 14.8 15.0 15.2 15.1  LYMPHSABS 0.3*  --   --   --   --   MONOABS 0.4  --   --   --   --   EOSABS 0.0  --   --   --   --   BASOSABS 0.0  --   --   --   --     Chemistries   Recent Labs Lab 12/30/2015 2213 12/28/15 0353 12/28/15 1139 12/29/15 0326 12/30/15 0329 12/31/15 0857  NA 141 141  --  145 149* 155*  K 4.1 3.7  --  3.8 3.6 4.4  CL 97* 96*  --  97* 97* 105  CO2 28 32  --  33* 33* 36*  GLUCOSE 107* 133*  --  86 89 93  BUN 47* 49*  --  57* 70* 107*  CREATININE 1.95* 1.91*  --  1.93* 1.86* 2.62*  CALCIUM 9.4 9.2  --  9.2 9.3 9.6  MG  --   --   --   --  2.5*  --   AST 18 18 25   --   --   --   ALT 16 15 13*  --   --   --   ALKPHOS 79 73 68  --   --   --   BILITOT 0.8 0.9 1.3*  --   --   --    ------------------------------------------------------------------------------------------------------------------ estimated creatinine clearance is 16.6 mL/min (by C-G formula based on Cr of 2.62). ------------------------------------------------------------------------------------------------------------------ No results for input(s): HGBA1C in the last 72 hours. ------------------------------------------------------------------------------------------------------------------ No results for input(s): CHOL, HDL, LDLCALC, TRIG, CHOLHDL, LDLDIRECT in the last 72 hours. ------------------------------------------------------------------------------------------------------------------ No results for input(s): TSH, T4TOTAL, T3FREE, THYROIDAB in the last 72 hours.  Invalid input(s): FREET3 ------------------------------------------------------------------------------------------------------------------ No results for input(s): VITAMINB12, FOLATE, FERRITIN, TIBC, IRON, RETICCTPCT in the last 72 hours.  Coagulation  profile  Recent Labs Lab 12/18/2015 2213 12/28/15 0149 12/28/15 0713 12/29/15 0326  INR >10.00* 8.48* 1.79* 1.22    No results for input(s): DDIMER in the last 72 hours.  Cardiac Enzymes No results for input(s): CKMB, TROPONINI, MYOGLOBIN in the last 168 hours.  Invalid input(s): CK ------------------------------------------------------------------------------------------------------------------ Invalid input(s): POCBNP  No results for input(s): GLUCAP in the last 72 hours.   Ashling Roane M.D. [facility] 01-16-2016, 12:14 PM  Pager: [phone] Between 7am to 7pm - call Pager - [phone]  After 7pm go to [email or web address] - password [email or web address]  Call night coverage person covering after 7pm

## 2016-01-06 NOTE — Progress Notes (Signed)
Pt lying in bed multiple family members at the bedside. Pt appears to be comfortable, Morphine drip infusing. Family request to move to palliative care unit, and for [place] not to turn,and  monitor vital signs.

## 2016-01-06 NOTE — Progress Notes (Signed)
Pt's family requesting to talk to a chaplain, On call Chaplain paged and came to talked with the pt's family.

## 2016-01-06 NOTE — Progress Notes (Signed)
Margaret from [facility] called pt is VRE positive and needs to be placed on contact precautions, explained to Joycelyn Schmid the circumstances pt is actively dying.

## 2016-01-06 NOTE — Progress Notes (Signed)
Two RN's listened and verified time of death 44. Family at the bedside, MD and Palliative care team made aware.

## 2016-01-06 DEATH — deceased

## 2016-01-23 ENCOUNTER — Telehealth: Payer: Self-pay

## 2016-01-23 NOTE — Telephone Encounter (Signed)
Faxed signed orders to [facility] on 01/09/2016

## 2016-02-07 ENCOUNTER — Encounter: Payer: Self-pay | Admitting: *Deleted

## 2016-02-12 ENCOUNTER — Ambulatory Visit: Payer: Self-pay | Admitting: Podiatry

## 2016-02-12 DIAGNOSIS — Z7189 Other specified counseling: Secondary | ICD-10-CM | POA: Insufficient documentation

## 2016-02-12 DIAGNOSIS — R0600 Dyspnea, unspecified: Secondary | ICD-10-CM | POA: Insufficient documentation

## 2016-02-12 DIAGNOSIS — R52 Pain, unspecified: Secondary | ICD-10-CM | POA: Insufficient documentation

## 2016-02-12 DIAGNOSIS — Z515 Encounter for palliative care: Secondary | ICD-10-CM | POA: Insufficient documentation

## 2016-03-12 DIAGNOSIS — Z515 Encounter for palliative care: Secondary | ICD-10-CM | POA: Insufficient documentation

## 2016-06-01 IMAGING — DX DG CHEST 2V
2 series · 2 of 2 positions shown · non-contrast
Comparison: None.

CLINICAL DATA: Shortness of breath for a few days. Generalize
weakness.

EXAM:
CHEST  2 VIEW

[chest ap]
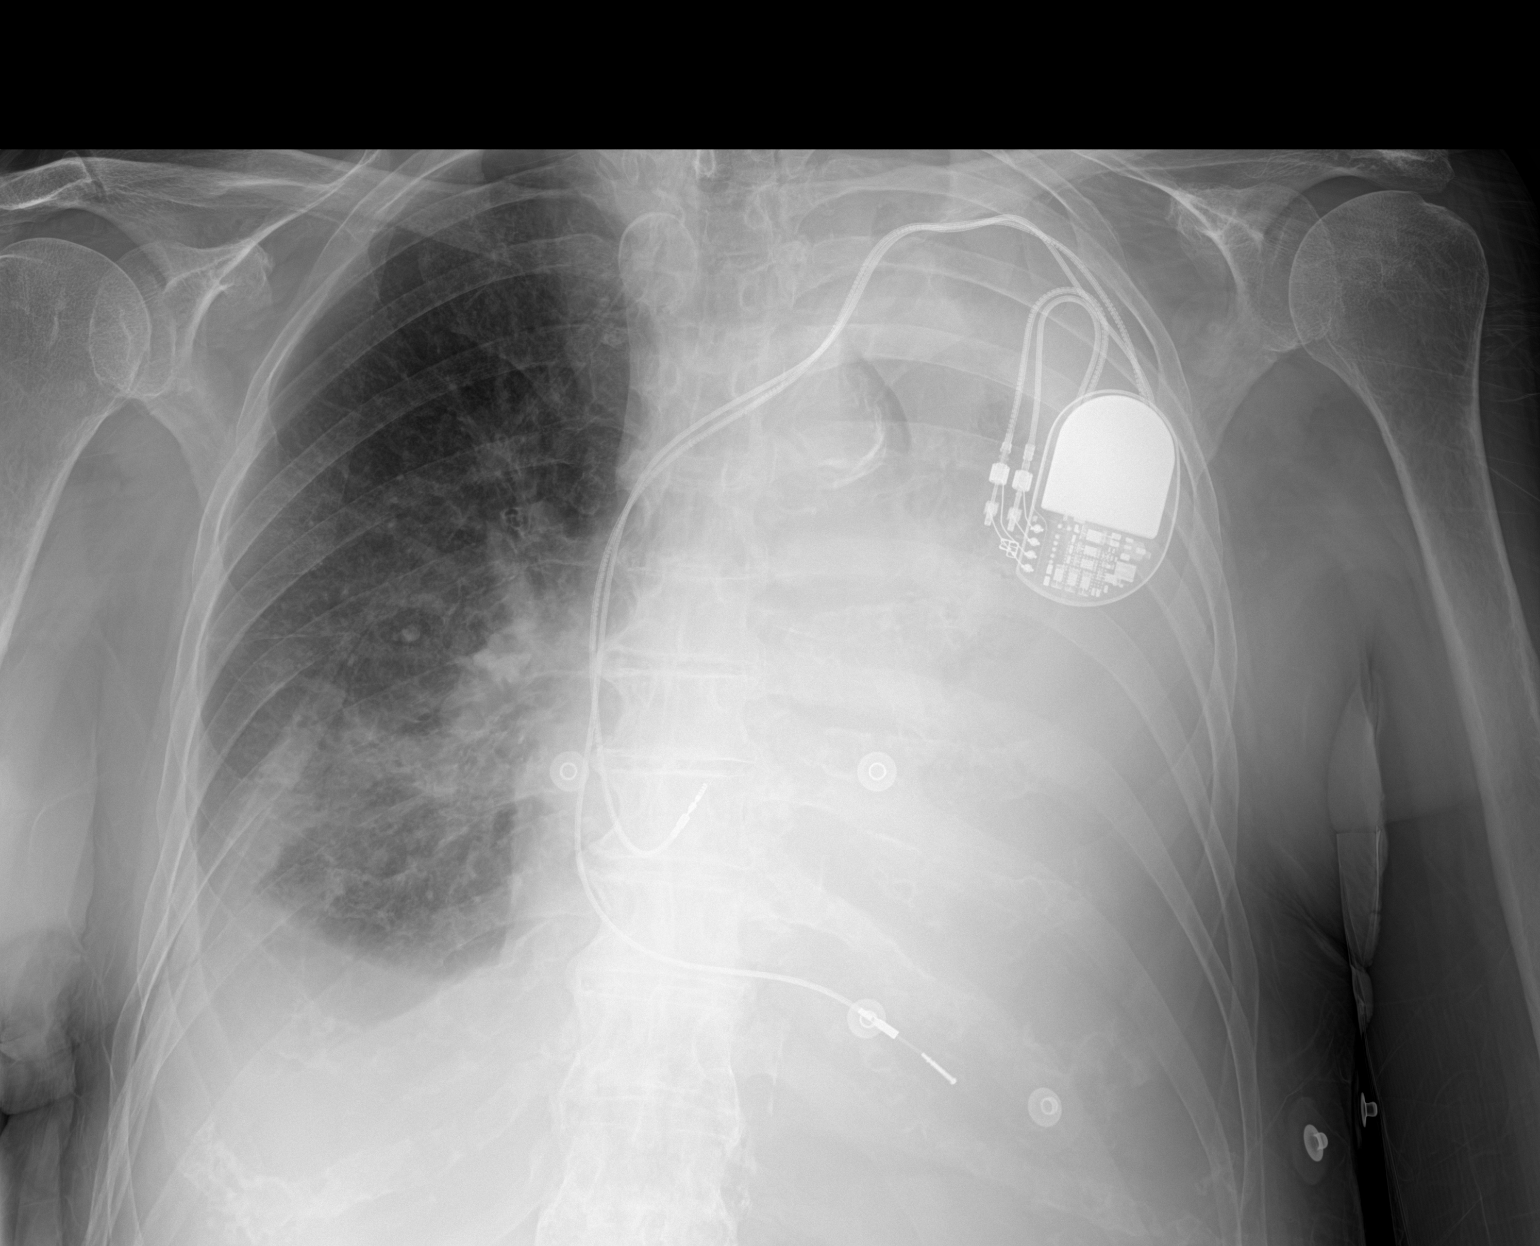

[chest lat]
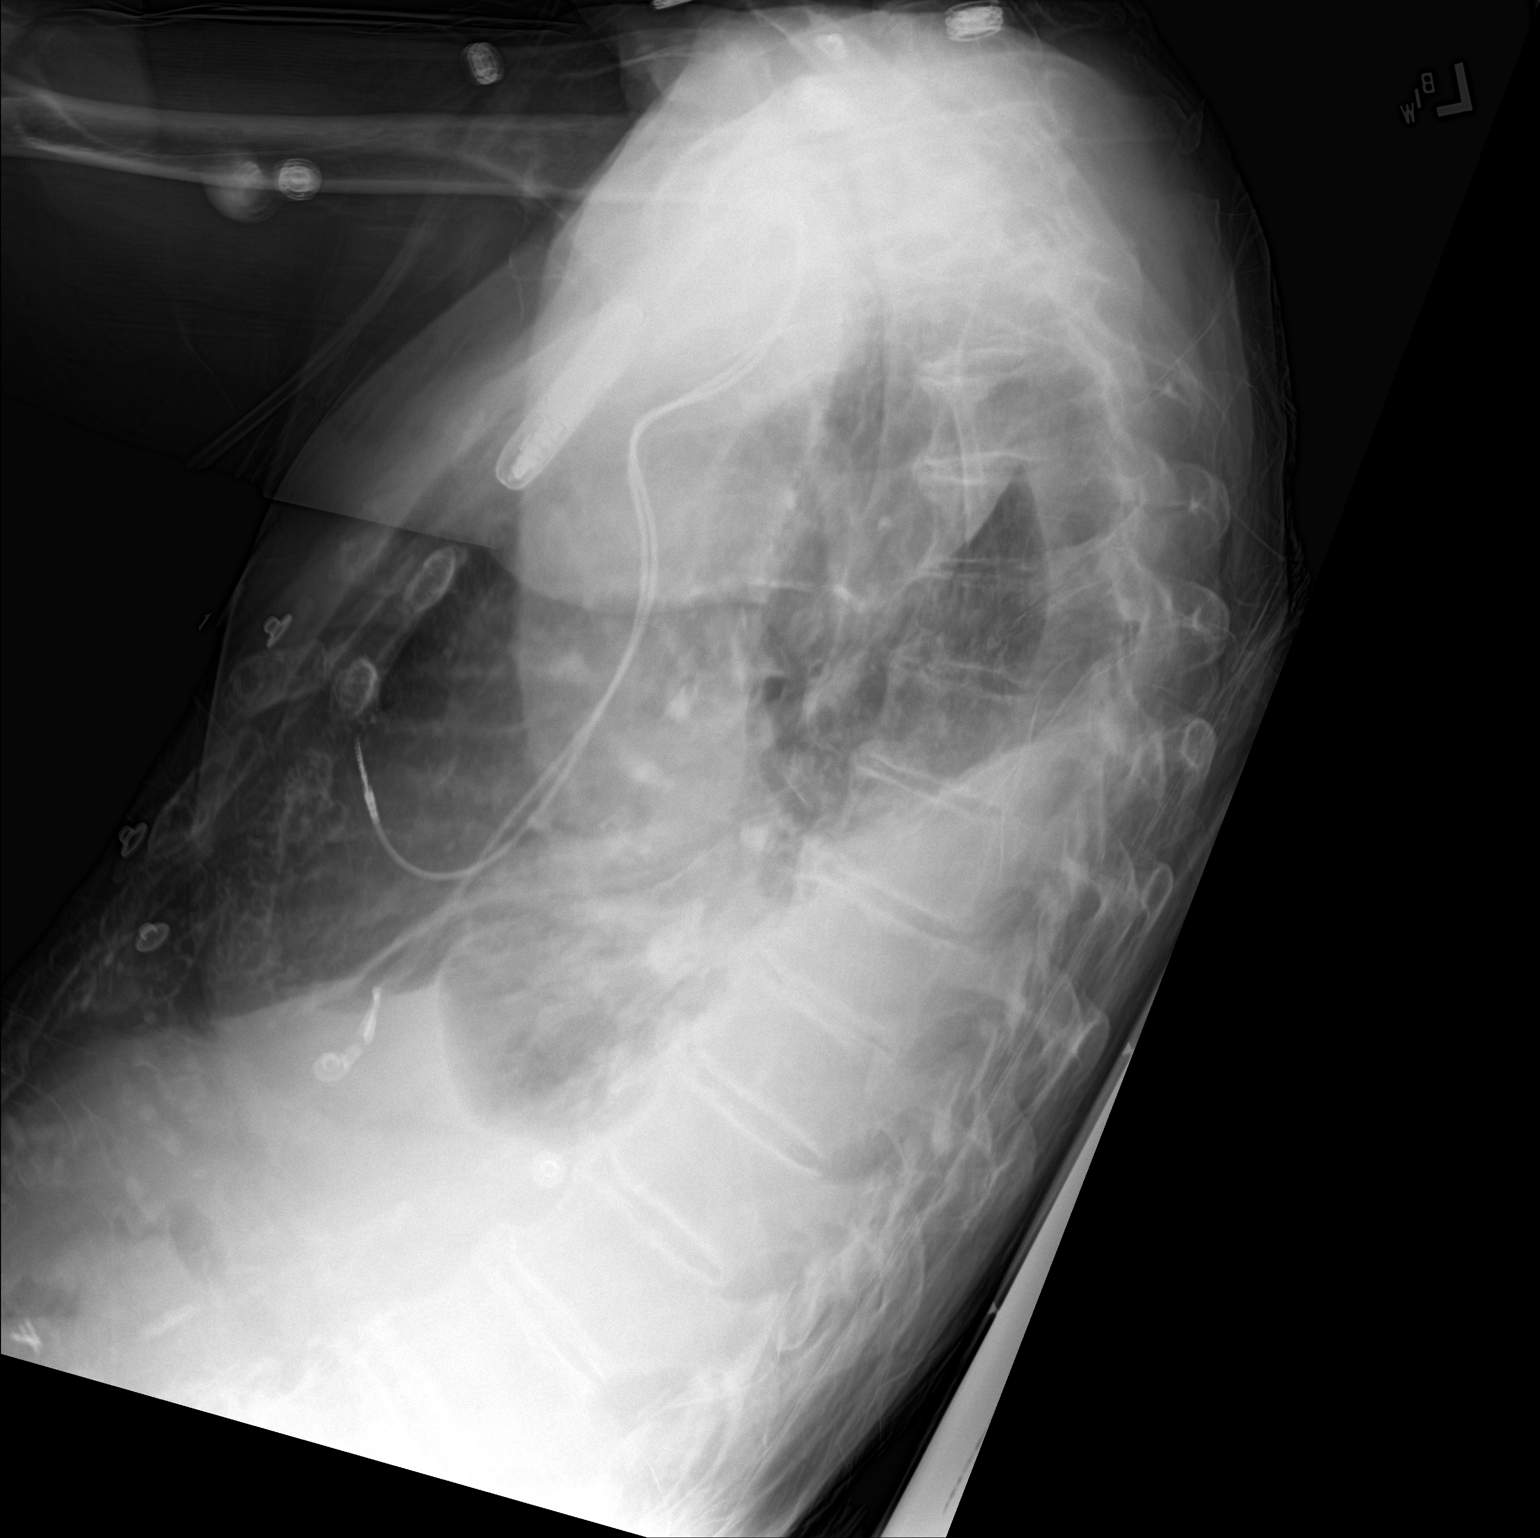

[2 of 2 positions shown; findings below may reference images not displayed]

FINDINGS: There is complete opacification of the left hemi thorax. In the
absence of prior surgery, this could represent either or a
combination of diffuse consolidation, large pleural effusion, and
atelectasis. Volume loss on the left with mild hyperinflation on the
right. There is a moderate-sized right pleural effusion. There is
focal infiltration in the right lower lung which could represent
pneumonia or atelectasis. Heart size is obscured by the parenchymal
process. No definite vascular congestion on the right. Calcification
of the aorta. Degenerative changes in the spine. Cardiac pacemaker.
IMPRESSION: Complete opacification of the left hemi thorax. Moderate size right
pleural effusion with basilar atelectasis or infiltration.
